# Patient Record
Sex: Female | Born: 1970 | Race: White | Hispanic: No | Marital: Single | Smoking: Current every day smoker
Health system: Southern US, Community
[De-identification: ages and names within clinical notes are randomized; demographics above are authoritative.]

## PROBLEM LIST (undated history)

## (undated) DIAGNOSIS — F319 Bipolar disorder, unspecified: Secondary | ICD-10-CM

## (undated) DIAGNOSIS — R519 Headache, unspecified: Secondary | ICD-10-CM

## (undated) DIAGNOSIS — M1990 Unspecified osteoarthritis, unspecified site: Secondary | ICD-10-CM

## (undated) DIAGNOSIS — F32A Depression, unspecified: Secondary | ICD-10-CM

## (undated) DIAGNOSIS — F329 Major depressive disorder, single episode, unspecified: Secondary | ICD-10-CM

## (undated) DIAGNOSIS — R32 Unspecified urinary incontinence: Secondary | ICD-10-CM

## (undated) DIAGNOSIS — C801 Malignant (primary) neoplasm, unspecified: Secondary | ICD-10-CM

## (undated) DIAGNOSIS — J45909 Unspecified asthma, uncomplicated: Secondary | ICD-10-CM

## (undated) DIAGNOSIS — R51 Headache: Secondary | ICD-10-CM

## (undated) DIAGNOSIS — B019 Varicella without complication: Secondary | ICD-10-CM

## (undated) DIAGNOSIS — G43909 Migraine, unspecified, not intractable, without status migrainosus: Secondary | ICD-10-CM

## (undated) HISTORY — DX: Varicella without complication: B01.9

## (undated) HISTORY — DX: Headache: R51

## (undated) HISTORY — DX: Headache, unspecified: R51.9

## (undated) HISTORY — DX: Depression, unspecified: F32.A

## (undated) HISTORY — DX: Major depressive disorder, single episode, unspecified: F32.9

## (undated) HISTORY — DX: Unspecified urinary incontinence: R32

## (undated) HISTORY — DX: Unspecified osteoarthritis, unspecified site: M19.90

## (undated) HISTORY — PX: OTHER SURGICAL HISTORY: SHX169

## (undated) HISTORY — DX: Migraine, unspecified, not intractable, without status migrainosus: G43.909

---

## 2000-09-09 HISTORY — PX: LAPAROSCOPIC REMOVAL ABDOMINAL MASS: SHX6360

## 2012-09-09 HISTORY — PX: ABDOMINAL HYSTERECTOMY: SHX81

## 2014-04-01 ENCOUNTER — Inpatient Hospital Stay (HOSPITAL_COMMUNITY)
Admission: AD | Admit: 2014-04-01 | Discharge: 2014-04-05 | DRG: 897 | Disposition: A | Discharge status: Home / Self Care | Payer: 59 | Source: Intra-hospital | Attending: Psychiatry | Admitting: Psychiatry

## 2014-04-01 ENCOUNTER — Encounter (HOSPITAL_COMMUNITY): Payer: Self-pay | Admitting: Emergency Medicine

## 2014-04-01 ENCOUNTER — Encounter (HOSPITAL_COMMUNITY): Payer: Self-pay | Admitting: *Deleted

## 2014-04-01 ENCOUNTER — Emergency Department (HOSPITAL_COMMUNITY)
Admission: EM | Admit: 2014-04-01 | Discharge: 2014-04-01 | Disposition: A | Discharge status: Other Acute Inpatient Hospital | Payer: Self-pay | Attending: Psychiatry | Admitting: Psychiatry

## 2014-04-01 DIAGNOSIS — F4310 Post-traumatic stress disorder, unspecified: Secondary | ICD-10-CM | POA: Diagnosis present

## 2014-04-01 DIAGNOSIS — F332 Major depressive disorder, recurrent severe without psychotic features: Secondary | ICD-10-CM | POA: Diagnosis present

## 2014-04-01 DIAGNOSIS — G4700 Insomnia, unspecified: Secondary | ICD-10-CM | POA: Diagnosis present

## 2014-04-01 DIAGNOSIS — R45851 Suicidal ideations: Secondary | ICD-10-CM | POA: Insufficient documentation

## 2014-04-01 DIAGNOSIS — F1120 Opioid dependence, uncomplicated: Secondary | ICD-10-CM | POA: Diagnosis present

## 2014-04-01 DIAGNOSIS — Z23 Encounter for immunization: Secondary | ICD-10-CM | POA: Diagnosis not present

## 2014-04-01 DIAGNOSIS — F329 Major depressive disorder, single episode, unspecified: Secondary | ICD-10-CM

## 2014-04-01 DIAGNOSIS — F17200 Nicotine dependence, unspecified, uncomplicated: Secondary | ICD-10-CM | POA: Insufficient documentation

## 2014-04-01 DIAGNOSIS — J45909 Unspecified asthma, uncomplicated: Secondary | ICD-10-CM | POA: Diagnosis present

## 2014-04-01 DIAGNOSIS — Z79899 Other long term (current) drug therapy: Secondary | ICD-10-CM | POA: Insufficient documentation

## 2014-04-01 DIAGNOSIS — F32A Depression, unspecified: Secondary | ICD-10-CM | POA: Diagnosis present

## 2014-04-01 DIAGNOSIS — R6883 Chills (without fever): Secondary | ICD-10-CM | POA: Insufficient documentation

## 2014-04-01 DIAGNOSIS — R112 Nausea with vomiting, unspecified: Secondary | ICD-10-CM | POA: Insufficient documentation

## 2014-04-01 DIAGNOSIS — G8929 Other chronic pain: Secondary | ICD-10-CM | POA: Diagnosis present

## 2014-04-01 DIAGNOSIS — Z8543 Personal history of malignant neoplasm of ovary: Secondary | ICD-10-CM | POA: Insufficient documentation

## 2014-04-01 DIAGNOSIS — F411 Generalized anxiety disorder: Secondary | ICD-10-CM | POA: Diagnosis present

## 2014-04-01 DIAGNOSIS — Z598 Other problems related to housing and economic circumstances: Secondary | ICD-10-CM | POA: Diagnosis not present

## 2014-04-01 DIAGNOSIS — Z5987 Material hardship due to limited financial resources, not elsewhere classified: Secondary | ICD-10-CM

## 2014-04-01 DIAGNOSIS — IMO0002 Reserved for concepts with insufficient information to code with codable children: Secondary | ICD-10-CM | POA: Insufficient documentation

## 2014-04-01 DIAGNOSIS — F1110 Opioid abuse, uncomplicated: Secondary | ICD-10-CM | POA: Insufficient documentation

## 2014-04-01 DIAGNOSIS — Z008 Encounter for other general examination: Secondary | ICD-10-CM | POA: Insufficient documentation

## 2014-04-01 DIAGNOSIS — F3289 Other specified depressive episodes: Secondary | ICD-10-CM | POA: Insufficient documentation

## 2014-04-01 DIAGNOSIS — Z5989 Other problems related to housing and economic circumstances: Secondary | ICD-10-CM | POA: Diagnosis not present

## 2014-04-01 DIAGNOSIS — F419 Anxiety disorder, unspecified: Secondary | ICD-10-CM

## 2014-04-01 HISTORY — DX: Unspecified asthma, uncomplicated: J45.909

## 2014-04-01 HISTORY — DX: Bipolar disorder, unspecified: F31.9

## 2014-04-01 HISTORY — DX: Malignant (primary) neoplasm, unspecified: C80.1

## 2014-04-01 LAB — RAPID URINE DRUG SCREEN, HOSP PERFORMED
AMPHETAMINES: NOT DETECTED
BARBITURATES: NOT DETECTED
Benzodiazepines: NOT DETECTED
COCAINE: NOT DETECTED
Opiates: POSITIVE — AB
TETRAHYDROCANNABINOL: NOT DETECTED

## 2014-04-01 LAB — BASIC METABOLIC PANEL
Anion gap: 13 (ref 5–15)
BUN: 11 mg/dL (ref 6–23)
CHLORIDE: 103 meq/L (ref 96–112)
CO2: 24 meq/L (ref 19–32)
CREATININE: 0.7 mg/dL (ref 0.50–1.10)
Calcium: 8.9 mg/dL (ref 8.4–10.5)
GFR calc non Af Amer: >90 mL/min (ref >90)
GLUCOSE: 107 mg/dL — AB (ref 70–99)
POTASSIUM: 3.8 meq/L (ref 3.7–5.3)
Sodium: 140 mEq/L (ref 137–147)

## 2014-04-01 LAB — CBC WITH DIFFERENTIAL/PLATELET
Basophils Absolute: 0 10*3/uL (ref 0.0–0.1)
Basophils Relative: 0 % (ref 0–1)
Eosinophils Absolute: 0 10*3/uL (ref 0.0–0.7)
Eosinophils Relative: 1 % (ref 0–5)
HEMATOCRIT: 43.5 % (ref 36.0–46.0)
HEMOGLOBIN: 15.3 g/dL — AB (ref 12.0–15.0)
LYMPHS ABS: 0.7 10*3/uL (ref 0.7–4.0)
LYMPHS PCT: 22 % (ref 12–46)
MCH: 32.7 pg (ref 26.0–34.0)
MCHC: 35.2 g/dL (ref 30.0–36.0)
MCV: 92.9 fL (ref 78.0–100.0)
Monocytes Absolute: 0.3 10*3/uL (ref 0.1–1.0)
Monocytes Relative: 8 % (ref 3–12)
NEUTROS ABS: 2.2 10*3/uL (ref 1.7–7.7)
NEUTROS PCT: 69 % (ref 43–77)
Platelets: 249 10*3/uL (ref 150–400)
RBC: 4.68 MIL/uL (ref 3.87–5.11)
RDW: 13.6 % (ref 11.5–15.5)
WBC: 3.2 10*3/uL — ABNORMAL LOW (ref 4.0–10.5)

## 2014-04-01 LAB — ETHANOL

## 2014-04-01 MED ORDER — ONDANSETRON HCL 4 MG PO TABS: 4.0000 mg | ORAL_TABLET | Freq: Three times a day (TID) | ORAL | Status: DC | PRN

## 2014-04-01 MED ORDER — ONDANSETRON 8 MG PO TBDP
8.0000 mg | ORAL_TABLET | Freq: Once | ORAL | Status: AC
  Administered 2014-04-01: 8 mg via ORAL
  Filled 2014-04-01: qty 1

## 2014-04-01 MED ORDER — CLONIDINE HCL 0.1 MG PO TABS
0.1000 mg | ORAL_TABLET | Freq: Four times a day (QID) | ORAL | Status: AC
  Administered 2014-04-02 – 2014-04-04 (×8): 0.1 mg via ORAL
  Filled 2014-04-01 (×12): qty 1

## 2014-04-01 MED ORDER — ALBUTEROL SULFATE HFA 108 (90 BASE) MCG/ACT IN AERS: 2.0000 | INHALATION_SPRAY | Freq: Four times a day (QID) | RESPIRATORY_TRACT | Status: DC | PRN

## 2014-04-01 MED ORDER — CLONIDINE HCL 0.1 MG PO TABS
0.1000 mg | ORAL_TABLET | Freq: Every day | ORAL | Status: DC
  Filled 2014-04-01: qty 1

## 2014-04-01 MED ORDER — MAGNESIUM HYDROXIDE 400 MG/5ML PO SUSP: 30.0000 mL | Freq: Every day | ORAL | Status: DC | PRN

## 2014-04-01 MED ORDER — ALUM & MAG HYDROXIDE-SIMETH 200-200-20 MG/5ML PO SUSP: 30.0000 mL | ORAL | Status: DC | PRN

## 2014-04-01 MED ORDER — LORAZEPAM 1 MG PO TABS: 1.0000 mg | ORAL_TABLET | Freq: Three times a day (TID) | ORAL | Status: DC | PRN

## 2014-04-01 MED ORDER — METHOCARBAMOL 500 MG PO TABS
500.0000 mg | ORAL_TABLET | Freq: Three times a day (TID) | ORAL | Status: DC | PRN
  Administered 2014-04-02 – 2014-04-05 (×6): 500 mg via ORAL
  Filled 2014-04-01 (×7): qty 1

## 2014-04-01 MED ORDER — MOMETASONE FURO-FORMOTEROL FUM 100-5 MCG/ACT IN AERO
2.0000 | INHALATION_SPRAY | Freq: Two times a day (BID) | RESPIRATORY_TRACT | Status: DC
  Administered 2014-04-02 – 2014-04-05 (×7): 2 via RESPIRATORY_TRACT
  Filled 2014-04-01: qty 8.8

## 2014-04-01 MED ORDER — ACETAMINOPHEN 325 MG PO TABS
650.0000 mg | ORAL_TABLET | Freq: Four times a day (QID) | ORAL | Status: DC | PRN
  Administered 2014-04-03: 650 mg via ORAL
  Filled 2014-04-01: qty 2

## 2014-04-01 MED ORDER — PNEUMOCOCCAL VAC POLYVALENT 25 MCG/0.5ML IJ INJ: 0.5000 mL | INJECTION | INTRAMUSCULAR | Status: DC

## 2014-04-01 MED ORDER — ZOLPIDEM TARTRATE 5 MG PO TABS: 5.0000 mg | ORAL_TABLET | Freq: Every evening | ORAL | Status: DC | PRN

## 2014-04-01 MED ORDER — DICYCLOMINE HCL 20 MG PO TABS
20.0000 mg | ORAL_TABLET | Freq: Four times a day (QID) | ORAL | Status: DC | PRN
  Administered 2014-04-02 – 2014-04-04 (×7): 20 mg via ORAL
  Filled 2014-04-01 (×7): qty 1

## 2014-04-01 MED ORDER — NAPROXEN 500 MG PO TABS
500.0000 mg | ORAL_TABLET | Freq: Two times a day (BID) | ORAL | Status: DC | PRN
  Administered 2014-04-02: 500 mg via ORAL
  Filled 2014-04-01: qty 1

## 2014-04-01 MED ORDER — ACETAMINOPHEN 325 MG PO TABS: 650.0000 mg | ORAL_TABLET | ORAL | Status: DC | PRN

## 2014-04-01 MED ORDER — LORAZEPAM 1 MG PO TABS
2.0000 mg | ORAL_TABLET | Freq: Once | ORAL | Status: AC
  Administered 2014-04-01: 2 mg via ORAL
  Filled 2014-04-01: qty 2

## 2014-04-01 MED ORDER — IBUPROFEN 400 MG PO TABS: 600.0000 mg | ORAL_TABLET | Freq: Three times a day (TID) | ORAL | Status: DC | PRN

## 2014-04-01 MED ORDER — LOPERAMIDE HCL 2 MG PO CAPS
2.0000 mg | ORAL_CAPSULE | ORAL | Status: DC | PRN
  Administered 2014-04-02: 4 mg via ORAL
  Filled 2014-04-01: qty 2

## 2014-04-01 MED ORDER — NICOTINE 21 MG/24HR TD PT24: 21.0000 mg | MEDICATED_PATCH | Freq: Every day | TRANSDERMAL | Status: DC

## 2014-04-01 MED ORDER — HYDROXYZINE HCL 25 MG PO TABS
25.0000 mg | ORAL_TABLET | Freq: Four times a day (QID) | ORAL | Status: DC | PRN
  Administered 2014-04-02 – 2014-04-05 (×10): 25 mg via ORAL
  Filled 2014-04-01 (×10): qty 1

## 2014-04-01 MED ORDER — TRAZODONE HCL 50 MG PO TABS
50.0000 mg | ORAL_TABLET | Freq: Every evening | ORAL | Status: DC | PRN
  Administered 2014-04-02 – 2014-04-05 (×3): 50 mg via ORAL
  Filled 2014-04-01 (×2): qty 1
  Filled 2014-04-01: qty 14
  Filled 2014-04-01: qty 1

## 2014-04-01 MED ORDER — ONDANSETRON 4 MG PO TBDP: 4.0000 mg | ORAL_TABLET | Freq: Four times a day (QID) | ORAL | Status: DC | PRN

## 2014-04-01 MED ORDER — CLONIDINE HCL 0.1 MG PO TABS
0.1000 mg | ORAL_TABLET | ORAL | Status: DC
  Administered 2014-04-04: 0.1 mg via ORAL
  Filled 2014-04-01 (×4): qty 1

## 2014-04-01 NOTE — Discharge Instructions (Signed)
Go to [facility] now. °

## 2014-04-01 NOTE — Progress Notes (Addendum)
Admission note:  Pt is a 43 year old [place] female admitted to the services of Dr. Sabra Heck for detox from opiates.  She states she has been abusing her pain medication up to 15-20 pills daily.  Pt states she has only used heroin once then stopped because it made her vomit.  She has been Restaurant manager, fast food off the street.  Pt is wishing to get clean because she states that this is no way to live, it is too expensive and is controlling her life.  Pt has a hx of cancer and has been cancer free for the last 6 months after having undergone a hysterectomy and partial bladder removal.  Pt is cooperative with the admission process and in no acute withdrawal.  She states her last use was two days ago.
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
## 2014-04-01 NOTE — ED Notes (Addendum)
Pt and father now upset that pt has to stay here. Comfort measure and therapeutic discussion given. EDP aware. Vo to start IVC papers.

## 2014-04-01 NOTE — ED Notes (Signed)
[facility] stated pt needed psych eval due to SI note in triage. Advised pt stated she has thought about it but denied si with my initial assessment. [facility] stated still needed one due to circumstances. EDP aware and telepschy ordered. PT aware. Pt stated she really didn't understand why she was being sent there anyway bc she knows she needs help with her depression also. [facility] called back and aware of telepsych being ordered. Pt has had no v/d in ED. Father present and updated per pt reqeust
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
## 2014-04-01 NOTE — ED Notes (Addendum)
Pt here for medical clearance to go to [facility] for detox from opioids. Last use x 2 days. Pt also has h/s manic depression and has not been taking meds x 3 weeks. Pt is SI. Denies HI.

## 2014-04-02 DIAGNOSIS — F1994 Other psychoactive substance use, unspecified with psychoactive substance-induced mood disorder: Secondary | ICD-10-CM

## 2014-04-02 DIAGNOSIS — F1120 Opioid dependence, uncomplicated: Principal | ICD-10-CM

## 2014-04-02 DIAGNOSIS — F332 Major depressive disorder, recurrent severe without psychotic features: Secondary | ICD-10-CM

## 2014-04-02 DIAGNOSIS — F4310 Post-traumatic stress disorder, unspecified: Secondary | ICD-10-CM

## 2014-04-02 MED ORDER — GABAPENTIN 100 MG PO CAPS
100.0000 mg | ORAL_CAPSULE | Freq: Three times a day (TID) | ORAL | Status: DC
  Administered 2014-04-02 – 2014-04-05 (×10): 100 mg via ORAL
  Filled 2014-04-02: qty 1
  Filled 2014-04-02: qty 42
  Filled 2014-04-02 (×10): qty 1
  Filled 2014-04-02: qty 42
  Filled 2014-04-02 (×3): qty 1
  Filled 2014-04-02: qty 42
  Filled 2014-04-02 (×2): qty 1

## 2014-04-02 MED ORDER — NICOTINE 21 MG/24HR TD PT24
21.0000 mg | MEDICATED_PATCH | Freq: Every day | TRANSDERMAL | Status: DC
  Administered 2014-04-02 – 2014-04-05 (×4): 21 mg via TRANSDERMAL
  Filled 2014-04-02 (×8): qty 1

## 2014-04-02 MED ORDER — SERTRALINE HCL 25 MG PO TABS
25.0000 mg | ORAL_TABLET | Freq: Every day | ORAL | Status: DC
  Administered 2014-04-02 – 2014-04-05 (×4): 25 mg via ORAL
  Filled 2014-04-02: qty 14
  Filled 2014-04-02 (×7): qty 1

## 2014-04-02 NOTE — BHH Group Notes (Signed)
[facility] Group Notes:  (Nursing/MHT/Case Management/Adjunct)  Date:  04/02/2014  Time:  3:32 PM  Type of Therapy:  Psychoeducational Skills  Participation Level:  Did Not Attend  Yvette Gaines 04/02/2014, 3:32 PM

## 2014-04-02 NOTE — BHH Group Notes (Signed)
[facility] Group Notes:  (Clinical Social Work)  04/02/2014     10-11AM  Summary of Progress/Problems:   The main focus of today's process group was for the patient to identify ways in which they have in the past sabotaged their own recovery. Motivational Interviewing and a worksheet were utilized to help patients explore in depth the perceived benefits and costs of their substance use, as well as the potential benefits and costs of stopping.  The Stages of Change were explained using a handout, with an emphasis on making plans to deal with sabotaging behaviors proactively.  The patient listened but was silent throughout group.  Type of Therapy:  Group Therapy - Process   Participation Level:  Active  Participation Quality:  Attentive  Affect:  Flat  Cognitive:  Oriented  Insight:  Limited  Engagement in Therapy:  Limited  Modes of Intervention:  Education, Support and Processing, Motivational Interviewing  Selmer Dominion, LCSW 04/02/2014, 11:21 AM

## 2014-04-02 NOTE — Progress Notes (Signed)
Patient ID: Yvette Gaines, female   DOB: 1971-04-19, 43 y.o.   MRN: [id number]   D: Pt has been very flat and depressed on the unit today. Pt has also reported severe withdrawals, and has required frequent as needed medication. Pt attended all groups and has engaged in treatment. Pt reported being negative SI/HI, no AH/VH noted. A: 15 min checks continued for patient safety. R: Pt safety maintained.

## 2014-04-02 NOTE — BHH Group Notes (Signed)
[place] Group Notes:  (Nursing/MHT/Case Management/Adjunct)  Date:  04/02/2014  Time:  10:45 AM  Type of Therapy:  Psychoeducational Skills  Participation Level:  Active  Participation Quality:  Appropriate  Affect:  Appropriate  Cognitive:  Appropriate  Insight:  Appropriate  Engagement in Group:  Engaged  Modes of Intervention:  Discussion  Summary of Progress/Problems: Pt did attend self inventory group, pt reported that she was negative SI/HI, no AH/VH noted. Pt rated her depression as a 8, and her helplessness/hopelessness as a 6.     Pt reported no issues or concerns.   Yvette Gaines 04/02/2014, 10:45 AM

## 2014-04-02 NOTE — Progress Notes (Signed)
[facility] Group Notes:  (Nursing/MHT/Case Management/Adjunct)  Date:  04/02/2014  Time:  6:48 PM  Type of Therapy:  Psychoeducational Skills  Participation Level:  Active  Participation Quality:  Appropriate and Attentive  Affect:  Appropriate  Cognitive:  Appropriate  Insight:  Appropriate  Engagement in Group:  Engaged and Supportive  Modes of Intervention:  Activity  Summary of Progress/Problems: Pts played an activity of Pictionary using coping skills. Pts enjoyed playing while learning new coping skills.  Clint Bolder 04/02/2014, 6:48 PM
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
## 2014-04-02 NOTE — Progress Notes (Signed)
D.  Pt has been withdrawing heavily today, did not feel well enough to attend evening AA group.  Interacting appropriately within milieu.  Denies HI/hallucinations, some passive SI but contracts for safety on unit.  A.  Support and encouragement offered, medication given as ordered for withdrawal.  R.  Pt remains safe, will continue to monitor.

## 2014-04-03 DIAGNOSIS — F1910 Other psychoactive substance abuse, uncomplicated: Secondary | ICD-10-CM

## 2014-04-03 DIAGNOSIS — F329 Major depressive disorder, single episode, unspecified: Secondary | ICD-10-CM | POA: Diagnosis present

## 2014-04-03 DIAGNOSIS — F419 Anxiety disorder, unspecified: Secondary | ICD-10-CM

## 2014-04-03 DIAGNOSIS — F3289 Other specified depressive episodes: Secondary | ICD-10-CM

## 2014-04-03 NOTE — Progress Notes (Signed)
Patient did attend the evening speaker AA meeting.  

## 2014-04-03 NOTE — BHH Group Notes (Signed)
[facility] Group Notes:  (Nursing/MHT/Case Management/Adjunct)  Date:  04/03/2014  Time:  6:42 PM  Type of Therapy:  Psychoeducational Skills  Participation Level:  Active  Participation Quality:  Appropriate  Affect:  Appropriate  Cognitive:  Appropriate  Insight:  Appropriate  Engagement in Group:  Engaged  Modes of Intervention:  Discussion  Summary of Progress/Problems: Pt did attend health support systems group, she noted her father.   Yvette Gaines 04/03/2014, 6:42 PM

## 2014-04-03 NOTE — BHH Group Notes (Signed)
[facility] Group Notes:  (Nursing/MHT/Case Management/Adjunct)  Date:  04/03/2014  Time:  6:17 PM  Type of Therapy:  Psychoeducational Skills  Participation Level:  Active  Participation Quality:  Appropriate  Affect:  Appropriate  Cognitive:  Appropriate  Insight:  Appropriate  Engagement in Group:  Engaged  Modes of Intervention:  Discussion  Summary of Progress/Problems: Pt did attend self inventory group, pt reported that she was negative SI/HI, no AH/VH noted. Pt rated her depression as a 3, and her helplessness/hopelessness as a 0.     Benancio Deeds [name] 04/03/2014, 6:17 PM

## 2014-04-03 NOTE — BHH Group Notes (Addendum)
  [facility] LCSW Group Therapy 04/03/2014   Type of Therapy: Group Therapy- Feelings Around Discharge & Establishing a Supportive Framework  Participation Level: Active   Participation Quality:  Appropriate  Affect:  Flat   Cognitive: Alert and Oriented   Insight:  Developing/Improving   Engagement in Therapy: Developing/Improving and Engaged   Modes of Intervention: Clarification, Confrontation, Discussion, Education, Exploration, Limit-setting, Orientation, Problem-solving, Rapport Building, Art therapist, Socialization and Support   Description of Group:   What is a supportive framework? What does it look like feel like and how do I discern it from and unhealthy non-supportive network? Learn how to cope when supports are not helpful and don't support you. Discuss what to do when your family/friends are not supportive.  Pt participated actively in group, identifying her father as a positive support for her.  Pt reports that her father has been sober for nearly 20 years; Pt recently moved in with her father to get away from her current environment.  Pt and father had IOP scheduled prior to Pt's admission.  She describes that "willpower" is important in choosing positive supports and distancing oneself from negative influences.     Therapeutic Modalities:   Cognitive Behavioral Therapy Person-Centered Therapy Motivational Interviewing   Peri Maris, [place] 04/03/2014 12:18 PM

## 2014-04-03 NOTE — Progress Notes (Signed)
D:  Pt has flat affect with depressed mood. Minimal but appropriate interaction. Pt reports withdrawal but less than the day before. Reports night sweats. Denies SI/HI/pain/AH/VH/. Attended group. Pt daily goal is to improve well being. Compliant with staff instructions. Pt given readable drug information on Zoloft before agreeing to administration. A: Pt safety maintained with q15 minute checks. Support and encouragement given. R: Will continue to monitor and stabilize. No voiced complaints.

## 2014-04-03 NOTE — Progress Notes (Signed)
D.  Pt pleasant on approach, positive for evening AA group.  Interacting appropriately with peers on unit.  Improving withdrawal symptoms today, less anxious.  Denies SI/HI/hallucinations at this time.  A.  Support and encouragement offered  R.  Pt remains safe on unit, will continue to monitor.

## 2014-04-03 NOTE — Progress Notes (Signed)
[facility] MD Progress Note  04/03/2014 11:16 AM Yvette Gaines  MRN:  [id number] Subjective:    Patiet is calm and cooperative, tearful during assessment.  Rates Depression 5/10 and Anxiety 6/10, "It's time to get myself clean" She tells her story of abuse of opioids starting with her cancer diagnosis and chronic pain.  It has gotten "out of control" over the past six months; she has lost her job, has grown children living at home, is responsible for her heroine addicted daughter's two children. Her main support person is her father who is clean 15 years from opoid addiction.  She is very familial with NA and the program.  Her work has told her she has a job in 90 days and she plans to utilize the time to get things back in order.  She is hopeful for the future, she recognizes her need to set boundaries.  Rates depression 4/10 Anxiety 5/10  Sleep is fair and appetite is poor  Diagnosis:   DSM5: Trauma-Stressor Disorders:  Posttraumatic Stress Disorder (309.81) Substance/Addictive Disorders:  Opioid Disorder - Severe (304.00) Depressive Disorders:  Major Depressive Disorder - Severe (296.23) Total Time spent with patient: 30 minutes  Axis I: Depressive Disorder NOS and Substance Abuse Axis II: Deferred Axis III:  Past Medical History  Diagnosis Date  . Cancer     ovarian, bladder  . Manic depression   . Asthma    Axis IV: economic problems, educational problems, problems related to legal system/crime, problems with access to health care services and problems with primary support group Axis V: 41-50 serious symptoms  ADL's:  Intact  Sleep: Fair  Appetite:  poor Suicidal Ideation: denies presently Homicidal Ideation: denies presently AEB (as evidenced by):  Psychiatric Specialty Exam: Physical Exam  Constitutional: She is oriented to person, place, and time. She appears well-developed and well-nourished.  HENT:  Head: Normocephalic and atraumatic.  Neck: Normal range of motion.  Neck supple.  Neurological: She is alert and oriented to person, place, and time.  Skin: Skin is warm and dry.    ROS  Blood pressure 90/65, pulse 66, temperature 97.5 F (36.4 C), temperature source Oral, resp. rate 16, height 5' 2"  (1.575 m), weight 56.7 kg (125 lb), SpO2 100.00%.Body mass index is 22.86 kg/(m^2).  General Appearance: Casual  Eye Contact::  Fair  Speech:  Slow  Volume:  Decreased  Mood:  Depressed  Affect:  Depressed and Tearful  Thought Process:  Goal Directed  Orientation:  Full (Time, Place, and Person)  Thought Content:  Negative  Suicidal Thoughts:  No  Homicidal Thoughts:  No  Memory:  Immediate;   Fair Recent;   Fair Remote;   Fair  Judgement:  Impaired  Insight:  Lacking  Psychomotor Activity:  Normal  Concentration:  Fair  Recall:  [place]  Language: Fair  Akathisia:  Negative  Handed:  Right  AIMS (if indicated):     Assets:  Communication Skills Desire for [place] Talents/Skills  Sleep:  Number of Hours: 6.75   Musculoskeletal: Strength & Muscle Tone: within normal limits Gait & Station: normal Patient leans: N/A  Current Medications: Current Facility-Administered Medications  Medication Dose Route Frequency Provider Last Rate Last Dose  . acetaminophen (TYLENOL) tablet 650 mg  650 mg Oral Q6H PRN Lurena Nida, NP      . albuterol (PROVENTIL HFA;VENTOLIN HFA) 108 (90 BASE) MCG/ACT inhaler 2 puff  2 puff Inhalation Q6H PRN Lurena Nida,  NP      . alum & mag hydroxide-simeth (MAALOX/MYLANTA) 200-200-20 MG/5ML suspension 30 mL  30 mL Oral Q4H PRN Lurena Nida, NP      . cloNIDine (CATAPRES) tablet 0.1 mg  0.1 mg Oral QID Lurena Nida, NP   0.1 mg at 04/03/14 1109   Followed by  . [START ON 04/04/2014] cloNIDine (CATAPRES) tablet 0.1 mg  0.1 mg Oral BH-qamhs Lurena Nida, NP       Followed by  . [START ON 04/07/2014] cloNIDine (CATAPRES) tablet 0.1 mg  0.1 mg Oral QAC  breakfast Lurena Nida, NP      . dicyclomine (BENTYL) tablet 20 mg  20 mg Oral Q6H PRN Lurena Nida, NP   20 mg at 04/03/14 0603  . gabapentin (NEURONTIN) capsule 100 mg  100 mg Oral TID Delfin Gant, NP   100 mg at 04/03/14 1109  . hydrOXYzine (ATARAX/VISTARIL) tablet 25 mg  25 mg Oral Q6H PRN Lurena Nida, NP   25 mg at 04/03/14 0603  . loperamide (IMODIUM) capsule 2-4 mg  2-4 mg Oral PRN Lurena Nida, NP   4 mg at 04/02/14 0610  . magnesium hydroxide (MILK OF MAGNESIA) suspension 30 mL  30 mL Oral Daily PRN Lurena Nida, NP      . methocarbamol (ROBAXIN) tablet 500 mg  500 mg Oral Q8H PRN Lurena Nida, NP   500 mg at 04/03/14 0603  . mometasone-formoterol (DULERA) 100-5 MCG/ACT inhaler 2 puff  2 puff Inhalation BID Lurena Nida, NP   2 puff at 04/03/14 4315  . naproxen (NAPROSYN) tablet 500 mg  500 mg Oral BID PRN Lurena Nida, NP   500 mg at 04/02/14 4008  . nicotine (NICODERM CQ - dosed in mg/24 hours) patch 21 mg  21 mg Transdermal Daily Nicholaus Bloom, MD   21 mg at 04/03/14 0818  . ondansetron (ZOFRAN-ODT) disintegrating tablet 4 mg  4 mg Oral Q6H PRN Lurena Nida, NP      . pneumococcal 23 valent vaccine (PNU-IMMUNE) injection 0.5 mL  0.5 mL Intramuscular Tomorrow-1000 Nicholaus Bloom, MD      . sertraline (ZOLOFT) tablet 25 mg  25 mg Oral Daily Delfin Gant, NP   25 mg at 04/03/14 0818  . traZODone (DESYREL) tablet 50 mg  50 mg Oral QHS PRN Lurena Nida, NP   50 mg at 04/02/14 2137    Lab Results:  Results for orders placed during the hospital encounter of 04/01/14 (from the past 48 hour(s))  URINE RAPID DRUG SCREEN (HOSP PERFORMED)     Status: Abnormal   Collection Time    04/01/14  3:07 PM      Result Value Ref Range   Opiates POSITIVE (*) NONE DETECTED   Cocaine NONE DETECTED  NONE DETECTED   Benzodiazepines NONE DETECTED  NONE DETECTED   Amphetamines NONE DETECTED  NONE DETECTED   Tetrahydrocannabinol NONE DETECTED  NONE DETECTED   Barbiturates NONE  DETECTED  NONE DETECTED   Comment:            DRUG SCREEN FOR MEDICAL PURPOSES     ONLY.  IF CONFIRMATION IS NEEDED     FOR ANY PURPOSE, NOTIFY LAB     WITHIN 5 DAYS.                LOWEST DETECTABLE LIMITS     FOR URINE DRUG SCREEN     Drug  Class       Cutoff (ng/mL)     Amphetamine      1000     Barbiturate      200     Benzodiazepine   235     Tricyclics       361     Opiates          300     Cocaine          300     THC              50  CBC WITH DIFFERENTIAL     Status: Abnormal   Collection Time    04/01/14  3:35 PM      Result Value Ref Range   WBC 3.2 (*) 4.0 - 10.5 K/uL   RBC 4.68  3.87 - 5.11 MIL/uL   Hemoglobin 15.3 (*) 12.0 - 15.0 g/dL   HCT 43.5  36.0 - 46.0 %   MCV 92.9  78.0 - 100.0 fL   MCH 32.7  26.0 - 34.0 pg   MCHC 35.2  30.0 - 36.0 g/dL   RDW 13.6  11.5 - 15.5 %   Platelets 249  150 - 400 K/uL   Neutrophils Relative % 69  43 - 77 %   Neutro Abs 2.2  1.7 - 7.7 K/uL   Lymphocytes Relative 22  12 - 46 %   Lymphs Abs 0.7  0.7 - 4.0 K/uL   Monocytes Relative 8  3 - 12 %   Monocytes Absolute 0.3  0.1 - 1.0 K/uL   Eosinophils Relative 1  0 - 5 %   Eosinophils Absolute 0.0  0.0 - 0.7 K/uL   Basophils Relative 0  0 - 1 %   Basophils Absolute 0.0  0.0 - 0.1 K/uL  BASIC METABOLIC PANEL     Status: Abnormal   Collection Time    04/01/14  3:35 PM      Result Value Ref Range   Sodium 140  137 - 147 mEq/L   Potassium 3.8  3.7 - 5.3 mEq/L   Chloride 103  96 - 112 mEq/L   CO2 24  19 - 32 mEq/L   Glucose, Bld 107 (*) 70 - 99 mg/dL   BUN 11  6 - 23 mg/dL   Creatinine, Ser 0.70  0.50 - 1.10 mg/dL   Calcium 8.9  8.4 - 10.5 mg/dL   GFR calc non Af Amer >90  >90 mL/min   GFR calc Af Amer >90  >90 mL/min   Comment: (NOTE)     The eGFR has been calculated using the CKD EPI equation.     This calculation has not been validated in all clinical situations.     eGFR's persistently <90 mL/min signify possible Chronic Kidney     Disease.   Anion gap 13  5 - 15  ETHANOL      Status: None   Collection Time    04/01/14  3:35 PM      Result Value Ref Range   Alcohol, Ethyl (B) <11  0 - 11 mg/dL   Comment:            LOWEST DETECTABLE LIMIT FOR     SERUM ALCOHOL IS 11 mg/dL     FOR MEDICAL PURPOSES ONLY    Physical Findings: AIMS: Facial and Oral Movements Muscles of Facial Expression: None, normal Lips and Perioral Area: None, normal Jaw: None, normal Tongue: None, normal,Extremity Movements Upper (  arms, wrists, hands, fingers): None, normal Lower (legs, knees, ankles, toes): None, normal, Trunk Movements Neck, shoulders, hips: None, normal, Overall Severity Severity of abnormal movements (highest score from questions above): None, normal Incapacitation due to abnormal movements: None, normal Patient's awareness of abnormal movements (rate only patient's report): No Awareness, Dental Status Current problems with teeth and/or dentures?: No Does patient usually wear dentures?: No  CIWA:  CIWA-Ar Total: 3 COWS:  COWS Total Score: 5  Treatment Plan Summary: Daily contact with patient to assess and evaluate symptoms and progress in treatment Medication management -continue detox protocol- no side effects reported -attend groups -continue crisis management and stabilization. Estimated length of stay 5-7 days -coping skills for depression, substance abuse, and anxiety -Treat health problems as indicated. - Develop treatment plan to decrease risk of relapse upon discharge and the need for readmission -Psych-social education regarding relapse prevention and self care. -Disposition in progress   Medical Decision Making Problem Points:  Established problem, stable/improving (1) and Review of psycho-social stressors (1) Data Points:  Review of medication regiment & side effects (2) Review of new medications or change in dosage (2)  I certify that inpatient services furnished can reasonably be expected to improve the patient's condition.   [place],  [place] 04/03/2014, 11:16 AM I agreed with findings and treatment plan of this patient

## 2014-04-03 NOTE — Tx Team (Signed)
Initial Interdisciplinary Treatment Plan  PATIENT STRENGTHS: (choose at least two) Average or above average intelligence Motivation for treatment/growth Supportive family/friends  PATIENT STRESSORS: Health problems Substance abuse   PROBLEM LIST: Problem List/Patient Goals Date to be addressed Date deferred Reason deferred Estimated date of resolution  Depression 04/02/14     Suicidal Ideation 04/02/14     Substance abuse 04/02/14                                          DISCHARGE CRITERIA:  Improved stabilization in mood, thinking, and/or behavior Motivation to continue treatment in a less acute level of care Need for constant or close observation no longer present Withdrawal symptoms are absent or subacute and managed without 24-hour nursing intervention  PRELIMINARY DISCHARGE PLAN: Attend 12-step recovery group Return to previous living arrangement  PATIENT/FAMIILY INVOLVEMENT: This treatment plan has been presented to and reviewed with the patient, Yvette Gaines.  The patient and family have been given the opportunity to ask questions and make suggestions.  Margaretann Loveless 04/03/2014, 5:24 AM

## 2014-04-04 MED ORDER — ASPIRIN-ACETAMINOPHEN-CAFFEINE 250-250-65 MG PO TABS
1.0000 | ORAL_TABLET | Freq: Four times a day (QID) | ORAL | Status: DC | PRN
  Administered 2014-04-04: 1 via ORAL
  Filled 2014-04-04 (×2): qty 1

## 2014-04-04 NOTE — Progress Notes (Signed)
Patient ID: Yvette Gaines, female   DOB: March 28, 1971, 43 y.o.   MRN: [id number]   Pt was tearful but pleasant and cooperative during the adm process. Stated, "I didn't want to kill myself, it was stupid". Pt stated that at times "she doesn't like herself, and thinks she's not good enough." Stated, "my family talks about each other then laugh and talk in each other's face".  Pt states she was to start a program at [facility] under the leadership of Jennelle Human.  States this month is supposed to be her first month at [facility] set up by her probation officer. Pt has a 10 and 43 yr old that stay with their dad. Pt and her husband live with the pt's mother. Pt stated she's been having a difficult time finding a job because she's a felon. "I feel like a failure".

## 2014-04-04 NOTE — Progress Notes (Signed)
Adult Psychoeducational Group Note  Date:  04/04/2014 Time:  10:00 am   Group Topic/Focus:  Wellness Toolbox:   The focus of this group is to discuss various aspects of wellness, balancing those aspects and exploring ways to increase the ability to experience wellness.  Patients will create a wellness toolbox for use upon discharge.  Participation Level:  Active  Participation Quality:  Appropriate, Sharing and Supportive  Affect:  Appropriate  Cognitive:  Appropriate  Insight: Appropriate  Engagement in Group:  Engaged  Modes of Intervention:  Discussion, Education, Socialization and Support  Additional Comments:  Pt stated that by staying clean and taking care of herself more than others that she can live a more healthy lifestyle. Pt stated that the barriers to her progress are the people in her life that are negative influences and an overwhelming family life.  Yvette Gaines 04/04/2014, 2:09 PM

## 2014-04-04 NOTE — Progress Notes (Signed)
Case discussed with NP. Above note reviewed. Agree with above assessment and plan.  Dereck Leep, MD

## 2014-04-04 NOTE — Progress Notes (Signed)
Patient ID: Yvette Gaines, female   DOB: [birth year], 43 y.o.   MRN: [id number] She has been up and to groups interacting with peers and staff. Has attended groups requested and received prn for a migraine at 1703. Self inventory this AM: depression 1, hopelessness 0, anxiety 2, denies withdrawal symptoms other then being tired. Denies SI thoughts and pain. Goal: planning my wellness plan so that I may start my discharge.  To meet goal: make sure my therapy is set up and all my support groups. Need help with medications after discharge.

## 2014-04-04 NOTE — Progress Notes (Signed)
Patient ID: Yvette Gaines, female   DOB: [birth year], 43 y.o.   MRN: [id number]   D: Pt was in the med room interacting with her peers. During the assessment pt informed the writer that she's been "anxious off and on". Stated that in group they were discussing. "going home".  Stated that 2 days before coming to [facility] she gave "up her place and everything in it". Pt stated, "she wanted to get a fresh start".   A:  Support and encouragement was offered. 15 min checks continued for safety.  R: Pt remains safe.

## 2014-04-04 NOTE — BHH Group Notes (Signed)
[facility] LCSW Group Therapy  04/04/2014 2:54 PM  Type of Therapy:  Group Therapy  Participation Level:  Active  Participation Quality:  Attentive  Affect:  Appropriate  Cognitive:  Alert and Oriented  Insight:  Engaged  Engagement in Therapy:  Improving  Modes of Intervention:  Confrontation, Discussion, Education, Exploration, Problem-solving, Rapport Building, Socialization and Support  Summary of Progress/Problems: Today's Topic: Overcoming Obstacles. Pt identified obstacles faced currently and processed barriers involved in overcoming these obstacles. Pt identified steps necessary for overcoming these obstacles and explored motivation (internal and external) for facing these difficulties head on. Pt further identified one area of concern in their lives and chose a skill of focus pulled from their "toolbox." Debbie was attentive and engaged throughout today's therapy group. She shared that she plans to move in with her parents and recently ended a five year relationship with a boyfriend also addicted to pain meds. Koi shows progress in the group setting and improving insight AEB her ability to process how her new living environment "supportive parents, boundaries with adult kids, and going to IOP at [facility]" will help her avoid relapse and put recovery first. Shaneque shared that she made the choice to get well and wants to cut ties with anyone that does not support her decision, including her partner.    Smart, Akaya Proffit LCSWA  04/04/2014, 2:54 PM

## 2014-04-04 NOTE — Progress Notes (Signed)
Patient ID: Yvette Gaines, female   DOB: 1971-07-31, 43 y.o.   MRN: [id number] [facility] MD Progress Note  04/04/2014 4:13 PM Lurdes Haltiwanger  MRN:  [id number]  Subjective: Yvette Gaines says she is here for opioid detox, and that she is tolerating the treatments well. She says the withdrawal symptoms are getting better, however, she complained of headache pains. She adds that she sleeps at night, however, has frequent wake periods. Declines to have something for sleep. She rates her depression at #0, anxiety at #3. [name] hopes to enroll in the IOP program in the  [facility] clinic. Denies any SIHI, AVH.   Diagnosis:   DSM5: Trauma-Stressor Disorders:  Posttraumatic Stress Disorder (309.81) Substance/Addictive Disorders:  Opioid Disorder - Severe (304.00) Depressive Disorders:  Major Depressive Disorder - Severe (296.23) Total Time spent with patient: 30 minutes  Axis I: Depressive Disorder NOS and Substance Abuse Axis II: Deferred Axis III:  Past Medical History  Diagnosis Date  . Cancer     ovarian, bladder  . Manic depression   . Asthma    Axis IV: economic problems, educational problems, problems related to legal system/crime, problems with access to health care services and problems with primary support group Axis V: 41-50 serious symptoms  ADL's:  Intact  Sleep: Fair  Appetite:  poor Suicidal Ideation: denies presently Homicidal Ideation: denies presently AEB (as evidenced by):  Psychiatric Specialty Exam: Physical Exam  Constitutional: She is oriented to person, place, and time. She appears well-developed and well-nourished.  HENT:  Head: Normocephalic and atraumatic.  Neck: Normal range of motion. Neck supple.  Neurological: She is alert and oriented to person, place, and time.  Skin: Skin is warm and dry.    ROS  Blood pressure 102/66, pulse 59, temperature 97.1 F (36.2 C), temperature source Oral, resp. rate 16, height 5\' 2"  (1.575 m), weight 56.7 kg (125 lb), SpO2 100.00%.Body  mass index is 22.86 kg/(m^2).  General Appearance: Casual  Eye Contact::  Fair  Speech:  Slow  Volume:  Decreased  Mood:  Depressed  Affect:  Depressed and Tearful  Thought Process:  Goal Directed  Orientation:  Full (Time, Place, and Person)  Thought Content:  Negative  Suicidal Thoughts:  No  Homicidal Thoughts:  No  Memory:  Immediate;   Fair Recent;   Fair Remote;   Fair  Judgement:  Impaired  Insight:  Lacking  Psychomotor Activity:  Normal  Concentration:  Fair  Recall:  [place]  Language: Fair  Akathisia:  Negative  Handed:  Right  AIMS (if indicated):     Assets:  Communication Skills Desire for [place] Talents/Skills  Sleep:  Number of Hours: 6.5   Musculoskeletal: Strength & Muscle Tone: within normal limits Gait & Station: normal Patient leans: N/A  Current Medications: Current Facility-Administered Medications  Medication Dose Route Frequency Provider Last Rate Last Dose  . acetaminophen (TYLENOL) tablet 650 mg  650 mg Oral Q6H PRN Lurena Nida, NP   650 mg at 04/03/14 1520  . albuterol (PROVENTIL HFA;VENTOLIN HFA) 108 (90 BASE) MCG/ACT inhaler 2 puff  2 puff Inhalation Q6H PRN Lurena Nida, NP      . alum & mag hydroxide-simeth (MAALOX/MYLANTA) 200-200-20 MG/5ML suspension 30 mL  30 mL Oral Q4H PRN Lurena Nida, NP      . cloNIDine (CATAPRES) tablet 0.1 mg  0.1 mg Oral BH-qamhs Lurena Nida, NP       Followed by  . [  START ON 04/07/2014] cloNIDine (CATAPRES) tablet 0.1 mg  0.1 mg Oral QAC breakfast Lurena Nida, NP      . dicyclomine (BENTYL) tablet 20 mg  20 mg Oral Q6H PRN Lurena Nida, NP   20 mg at 04/04/14 0601  . gabapentin (NEURONTIN) capsule 100 mg  100 mg Oral TID Delfin Gant, NP   100 mg at 04/04/14 1200  . hydrOXYzine (ATARAX/VISTARIL) tablet 25 mg  25 mg Oral Q6H PRN Lurena Nida, NP   25 mg at 04/04/14 0601  . loperamide (IMODIUM) capsule 2-4 mg  2-4 mg Oral PRN Lurena Nida, NP   4 mg at 04/02/14 0610  . magnesium hydroxide (MILK OF MAGNESIA) suspension 30 mL  30 mL Oral Daily PRN Lurena Nida, NP      . methocarbamol (ROBAXIN) tablet 500 mg  500 mg Oral Q8H PRN Lurena Nida, NP   500 mg at 04/04/14 0601  . mometasone-formoterol (DULERA) 100-5 MCG/ACT inhaler 2 puff  2 puff Inhalation BID Lurena Nida, NP   2 puff at 04/04/14 2774  . naproxen (NAPROSYN) tablet 500 mg  500 mg Oral BID PRN Lurena Nida, NP   500 mg at 04/02/14 1287  . nicotine (NICODERM CQ - dosed in mg/24 hours) patch 21 mg  21 mg Transdermal Daily Nicholaus Bloom, MD   21 mg at 04/04/14 0835  . ondansetron (ZOFRAN-ODT) disintegrating tablet 4 mg  4 mg Oral Q6H PRN Lurena Nida, NP      . pneumococcal 23 valent vaccine (PNU-IMMUNE) injection 0.5 mL  0.5 mL Intramuscular Tomorrow-1000 Nicholaus Bloom, MD      . sertraline (ZOLOFT) tablet 25 mg  25 mg Oral Daily Delfin Gant, NP   25 mg at 04/04/14 0834  . traZODone (DESYREL) tablet 50 mg  50 mg Oral QHS PRN Lurena Nida, NP   50 mg at 04/03/14 2135    Lab Results:  No results found for this or any previous visit (from the past 48 hour(s)).  Physical Findings: AIMS: Facial and Oral Movements Muscles of Facial Expression: None, normal Lips and Perioral Area: None, normal Jaw: None, normal Tongue: None, normal,Extremity Movements Upper (arms, wrists, hands, fingers): None, normal Lower (legs, knees, ankles, toes): None, normal, Trunk Movements Neck, shoulders, hips: None, normal, Overall Severity Severity of abnormal movements (highest score from questions above): None, normal Incapacitation due to abnormal movements: None, normal Patient's awareness of abnormal movements (rate only patient's report): No Awareness, Dental Status Current problems with teeth and/or dentures?: No Does patient usually wear dentures?: No  CIWA:  CIWA-Ar Total: 3 COWS:  COWS Total Score: 1  Treatment Plan Summary: Daily contact with patient to  assess and evaluate symptoms and progress in treatment Medication management  Plan: 1. Continue crisis management and stabilization.  2. Supportive approach, relapse prevention.  3. Medication management: Continue Ativan taper for Alcohol detox, Excedrin tabs for headache pains..  4. Continue current plan of care.  Medical Decision Making Problem Points:  Established problem, stable/improving (1) and Review of psycho-social stressors (1) Data Points:  Review of medication regiment & side effects (2) Review of new medications or change in dosage (2)  I certify that inpatient services furnished can reasonably be expected to improve the patient's condition.   Lindell Spar I PMHNP 04/04/2014, 4:13 PM

## 2014-04-04 NOTE — BHH Suicide Risk Assessment (Signed)
[place] INPATIENT:  Family/Significant Other Suicide Prevention Education  Suicide Prevention Education:  Education Completed; Yvette Gaines (pt's father) [phone] has been identified by the patient as the family member/significant other with whom the patient will be residing, and identified as the person(s) who will aid the patient in the event of a mental health crisis (suicidal ideations/suicide attempt).  With written consent from the patient, the family member/significant other has been provided the following suicide prevention education, prior to the and/or following the discharge of the patient.  The suicide prevention education provided includes the following:  Suicide risk factors  Suicide prevention and interventions  National Suicide Hotline telephone number  [facility] assessment telephone number  [facility] Emergency Assistance [place] and/or Residential Mobile Crisis Unit telephone number  Request made of family/significant other to:  Remove weapons (e.g., guns, rifles, knives), all items previously/currently identified as safety concern.    Remove drugs/medications (over-the-counter, prescriptions, illicit drugs), all items previously/currently identified as a safety concern.  The family member/significant other verbalizes understanding of the suicide prevention education information provided.  The family member/significant other agrees to remove the items of safety concern listed above.  Smart, Mitchell Epling LCSWA  04/04/2014, 2:58 PM

## 2014-04-04 NOTE — BHH Group Notes (Signed)
[facility] LCSW Aftercare Discharge Planning Group Note   04/04/2014 9:27 AM  Participation Quality:  Appropriate   Mood/Affect:  Appropriate  Depression Rating:  0  Anxiety Rating:  2  Thoughts of Suicide:  No Will you contract for safety?   NA  Current AVH:  No  Plan for Discharge/Comments:  Pt reports that she plans to move back in with her father at d/c and has SA IOP already scheduled with Osborne Casco at [facility]. Pt hoping for d/c "in the next day or so." No withdrawals reported by pt.   Transportation Means: dad  Supports: father/ some family supports identified  Proofreader, Research officer, trade union

## 2014-04-04 NOTE — BHH Group Notes (Signed)
Adult Psychoeducational Group Note  Date:  04/04/2014 Time:  10:18 PM  Group Topic/Focus:  Wrap-Up Group:   The focus of this group is to help patients review their daily goal of treatment and discuss progress on daily workbooks.  Participation Level:  Active  Participation Quality:  Attentive  Affect:  Appropriate  Cognitive:  Appropriate  Insight: Appropriate  Engagement in Group:  Engaged  Modes of Intervention:  Discussion and Education  Additional Comments:  Pt. Listened to [place] visitor and was very attentive but offered little input during open discussion.  Russ Halo 04/04/2014, 10:18 PM

## 2014-04-05 DIAGNOSIS — F321 Major depressive disorder, single episode, moderate: Secondary | ICD-10-CM

## 2014-04-05 DIAGNOSIS — F1110 Opioid abuse, uncomplicated: Secondary | ICD-10-CM

## 2014-04-05 MED ORDER — GABAPENTIN 100 MG PO CAPS: 100.0000 mg | ORAL_CAPSULE | Freq: Three times a day (TID) | ORAL | Status: DC

## 2014-04-05 MED ORDER — SERTRALINE HCL 25 MG PO TABS: 25.0000 mg | ORAL_TABLET | Freq: Every day | ORAL | Status: DC

## 2014-04-05 MED ORDER — HYDROXYZINE HCL 25 MG PO TABS
25.0000 mg | ORAL_TABLET | Freq: Three times a day (TID) | ORAL | Status: DC | PRN
  Filled 2014-04-05: qty 30

## 2014-04-05 MED ORDER — HYDROXYZINE HCL 25 MG PO TABS: ORAL_TABLET | ORAL | Status: DC

## 2014-04-05 MED ORDER — TRAZODONE HCL 50 MG PO TABS: 50.0000 mg | ORAL_TABLET | Freq: Every evening | ORAL | Status: DC | PRN

## 2014-04-05 MED ORDER — FLUTICASONE-SALMETEROL 100-50 MCG/DOSE IN AEPB: 1.0000 | INHALATION_SPRAY | Freq: Two times a day (BID) | RESPIRATORY_TRACT | Status: DC

## 2014-04-05 MED ORDER — ALBUTEROL SULFATE HFA 108 (90 BASE) MCG/ACT IN AERS: 2.0000 | INHALATION_SPRAY | Freq: Four times a day (QID) | RESPIRATORY_TRACT | Status: DC | PRN

## 2014-04-05 NOTE — Progress Notes (Signed)
Recreation Therapy Notes  Animal-Assisted Activity/Therapy (AAA/T) Program Checklist/Progress Notes Patient Eligibility Criteria Checklist & Daily Group note for Rec Tx Intervention  Date: 07.28.2015 Time: 3:15pm Location: [address]   AAA/T Program Assumption of Risk Form signed by Patient/ or Parent Legal Guardian yes  Patient is free of allergies or sever asthma yes  Patient reports no fear of animals yes  Patient reports no history of cruelty to animals yes   Patient understands his/her participation is voluntary yes  Patient washes hands before animal contact yes  Patient washes hands after animal contact yes  Behavioral Response: Appropriate   Education: Hand Washing, Appropriate Animal Interaction   Education Outcome: Acknowledges understanding   Clinical Observations/Feedback: Patient interacted appropriately with therapeutic dog team and peers in session. Patient asked to leave session at approximately 3:20pm by RN to prepare for d/c.   Laureen Ochs Tylie Golonka, LRT/CTRS  Lane Hacker 04/05/2014 4:29 PM

## 2014-04-05 NOTE — Progress Notes (Signed)
Patient ID: Yvette Gaines, female   DOB: October 11, 1970, 43 y.o.   MRN: [id number] She has been up and interacting with peers and to groups . Has not c/o any discomfort. Self inventory: depression 4 up from 1, hopelessness 0, anxiety 7 up from 2 withdrawals of agitation and  irritability , SI , c/o headache and body aches, goal is to go home and start new. To meet goal: set my O P T .
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
## 2014-04-05 NOTE — Progress Notes (Signed)
The focus of this group is to educate the patient on the purpose and policies of crisis stabilization and provide a format to answer questions about their admission.  The group details unit policies and expectations of patients while admitted. ° °Pt did not attend. °

## 2014-04-05 NOTE — BHH Group Notes (Signed)
[facility] Group Therapy Note  Group Type: Group Therapy- [employer] Guest Speaker  Did not attend.  Peri Maris, [place] 04/05/2014 2:29 PM

## 2014-04-05 NOTE — BHH Suicide Risk Assessment (Addendum)
   Demographic Factors:  Low socioeconomic status and Unemployed  Total Time spent with patient: 20 minutes  Psychiatric Specialty Exam: Physical Exam  Psychiatric: She has a normal mood and affect. Her speech is normal and behavior is normal. Judgment and thought content normal. Cognition and memory are normal.    Review of Systems  Constitutional: Negative.   HENT: Negative.   Eyes: Negative.   Respiratory: Negative.   Cardiovascular: Negative.   Gastrointestinal: Negative.   Genitourinary: Negative.   Musculoskeletal: Negative.   Skin: Negative.   Neurological: Negative.   Endo/Heme/Allergies: Negative.   Psychiatric/Behavioral: Negative.     Blood pressure 96/58, pulse 81, temperature 97.9 F (36.6 C), temperature source Oral, resp. rate 18, height 5\' 2"  (1.575 m), weight 56.7 kg (125 lb), SpO2 100.00%.Body mass index is 22.86 kg/(m^2).  General Appearance: Negative  Eye Contact::  Good  Speech:  Clear and Coherent  Volume:  Normal  Mood:  Euthymic  Affect:  Appropriate  Thought Process:  Goal Directed  Orientation:  Full (Time, Place, and Person)  Thought Content:  Negative  Suicidal Thoughts:  No  Homicidal Thoughts:  No  Memory:  Immediate;   Fair Recent;   Fair Remote;   Fair  Judgement:  Good  Insight:  Fair  Psychomotor Activity:  Normal  Concentration:  Fair  Recall:  [employer] of Knowledge:Fair  Language: Good  Akathisia:  No  Handed:  Right  AIMS (if indicated):     Assets:  Communication Skills Desire for Improvement Physical Health  Sleep:  Number of Hours: 4.5    Musculoskeletal: Strength & Muscle Tone: within normal limits Gait & Station: normal Patient leans: N/A   Mental Status Per Nursing Assessment::   On Admission:  Suicidal ideation indicated by patient;Suicide plan;Self-harm thoughts  Current Mental Status by Physician: patient denies suicidal ideation, intent or plan  Loss Factors: Financial problems/change in  socioeconomic status  Historical Factors: NA  Risk Reduction Factors:   Positive social support  Continued Clinical Symptoms:  Alcohol/Substance Abuse/Dependencies  Cognitive Features That Contribute To Risk:  Closed-mindedness    Suicide Risk:  Minimal: No identifiable suicidal ideation.  Patients presenting with no risk factors but with morbid ruminations; may be classified as minimal risk based on the severity of the depressive symptoms  Discharge Diagnoses:   AXIS I:  Severe opioid use disorder  AXIS II:  Deferred AXIS III:   Past Medical History  Diagnosis Date  . Cancer     ovarian, bladder  . Manic depression   . Asthma    AXIS IV:  other psychosocial or environmental problems and problems related to social environment AXIS V:  61-70 mild symptoms  Plan Of Care/Follow-up recommendations:  Activity:  as tolerated Diet:  healthy Tests:  routine Other:  patient to keep her after care appointment  Is patient on multiple antipsychotic therapies at discharge:  No   Has Patient had three or more failed trials of antipsychotic monotherapy by history:  No  Recommended Plan for Multiple Antipsychotic Therapies: NA    Corena Pilgrim, MD 04/05/2014, 11:39 AM

## 2014-04-05 NOTE — Progress Notes (Signed)
[facility] Adult Case Management Discharge Plan :  Will you be returning to the same living situation after discharge: Yes,  home with dad At discharge, do you have transportation home?:Yes,  father Do you have the ability to pay for your medications:Yes,  mental health  Release of information consent forms completed and submitted to Medical Records by CSW.  Patient to Follow up at: Follow-up Information   Follow up with Tamela Gammon On 04/07/2014. [facility] followup appt-walk in between 7:45AM-11AM on this date. Referral number: [id number])    Contact information:   [place], [place] [address] Phone: [phone] Fax: [phone]      Patient denies SI/HI:   Yes,  during group/self report    Safety Planning and Suicide Prevention discussed:  Yes,  SPE completed with pt's father. SPI pamphlet provided to pt and she was encouraged to share information with support network, ask questions, and talk about any concerns relating to SPE.  Smart, Lael Pilch LCSWA  04/05/2014, 9:44 AM

## 2014-04-05 NOTE — Progress Notes (Signed)
D/C instructions/meds/follow-up appointments reviewed, pt verbalized understanding, pt's belongings returned to pt, samples given. 

## 2014-04-05 NOTE — Progress Notes (Signed)
Patient ID: Yvette Gaines, female   DOB: 04/06/1971, 43 y.o.   MRN: [id number] D: client lying in bed, eyes closed respirations unlabored. A: Writer observed for s/s of distress. Staff will monitor q41min for safety. R: Client is safe on the unit, no distress noted, respirations even.

## 2014-04-05 NOTE — Discharge Summary (Signed)
Physician Discharge Summary Note  Patient:  Yvette Gaines is an 43 y.o., female MRN:  [id number] DOB:  01-06-1971 Patient phone:  [phone] (home)  Patient address:   [place] [address],  Total Time spent with patient: Greater than 30 minutes  Date of Admission:  04/01/2014 Date of Discharge: 04/05/14  Reason for Admission: Opioid detox  Discharge Diagnoses: Principal Problem:   Severe opioid use disorder Active Problems:   Depression   Psychiatric Specialty Exam: Physical Exam  Psychiatric: Her speech is normal and behavior is normal. Judgment and thought content normal. Her mood appears not anxious. Her affect is not angry, not blunt, not labile and not inappropriate. Cognition and memory are normal. She does not exhibit a depressed mood.    Review of Systems  Constitutional: Negative.   HENT: Negative.   Eyes: Negative.   Respiratory: Negative.   Cardiovascular: Negative.   Gastrointestinal: Negative.   Genitourinary: Negative.   Musculoskeletal: Negative.   Skin: Negative.   Neurological: Negative.   Endo/Heme/Allergies: Negative.   Psychiatric/Behavioral: Positive for depression (Stable). Negative for suicidal ideas, hallucinations, memory loss and substance abuse. The patient has insomnia (Stable). The patient is not nervous/anxious.     Blood pressure 96/58, pulse 81, temperature 97.9 F (36.6 C), temperature source Oral, resp. rate 18, height 5\' 2"  (1.575 m), weight 56.7 kg (125 lb), SpO2 100.00%.Body mass index is 22.86 kg/(m^2).   General Appearance: Negative  Eye Contact:: Good  Speech: Clear and Coherent  Volume: Normal  Mood: Euthymic  Affect: Appropriate  Thought Process: Goal Directed  Orientation: Full (Time, Place, and Person)  Thought Content: Negative  Suicidal Thoughts: No  Homicidal Thoughts: No Memory: Immediate; Fair  Recent; Fair  Remote; Fair  Judgement: Good  Insight: Fair  Psychomotor Activity: Normal  Concentration:  Fair  Recall: [employer] of [place]  Language: Good  Akathisia: No  Handed: Right  AIMS (if indicated): Assets: Communication Skills  Desire for Improvement  Physical Health  Sleep: Number of Hours: 4.5   Past Psychiatric History: Diagnosis: Severe opioid use disorder, Major depressive disorder, moderate  Hospitalizations: [name] adult unit  Outpatient Care: [name]: [name]  Self-Mutilation: NA  Suicidal Attempts: NA  Violent Behaviors: NA   Musculoskeletal: Strength & Muscle Tone: within normal limits Gait & Station: normal Patient leans: N/A  DSM5: Schizophrenia Disorders:  NA Obsessive-Compulsive Disorders:  NA Trauma-Stressor Disorders:  NA Substance/Addictive Disorders:  Opioid Disorder - Severe (304.00) Depressive Disorders:  Major Depressive Disorder - Moderate (296.22)  Axis Diagnosis:   AXIS I:  Severe opioid use disorder, Major depressive disorder, moderate AXIS II:  Deferred AXIS III:   Past Medical History  Diagnosis Date  . Cancer     ovarian, bladder  . Manic depression   . Asthma    AXIS IV:  other psychosocial or environmental problems and Drug addiction AXIS V:  62  Level of Care:  OP  Hospital Course:  This is a 43 year old caucasian female who was admitted yesterday from [name] for treatment of Opioid addiction. Patient started using prescribed pain medications during her Cancer treatment. When she could not obtain prescribed Opiates, she started buying from the street. She states over all she has been using Opiates for 5 years. Patient states she decided to seek treatment now because she is tired of wasting her money and time thinking of how and where to get Opiates.  Yvette Gaines was admitted to the hospital with her UDS test reports showing positive opioid. She admitted having been addicted and abusing opiates. She was here for detoxification treatments and a referral to  substance abuse program. Yvette Gaines received Clonidine detox protocols for her opioid detoxification treatments. She also enrolled in the group counseling sessions, AA/NA meetings being offered and held on this unit. She learned coping skills.   Besides the detox treatments, Yvette Gaines was medicated and discharged on Gabapentin 100 mg three times daily for substance withdrawal syndrome, Hydroxyzine 25 mg three times daily as needed for anxiety/tension, Sertraline 25 mg daily for depression and Trazodone 50 mg Q bedtime. She also was resumed on her other pertinent home medications for her other medical issues. She tolerated her treatment regimen without any adverse effects and or reactions.  Yvette Gaines has completed detox treatments and her mood is stable. This is evidenced by her reports of improved mood and absence of substance withdrawal symptoms. She is currently being discharged to continue substance abuse treatment at the [name] in [place], [place]. She is provided with all the necessary information required to make this appointment without problems. She received from the [name], a 2 weeks worth, supply samples of her [name] discharge medications. She left [name] with all belongings in no distress. Transportation per father.  Consults:  psychiatry  Significant Diagnostic Studies:  labs: CBC with diff, CMP, UDS, toxicology tests, U/A  Discharge Vitals:   Blood pressure 96/58, pulse 81, temperature 97.9 F (36.6 C), temperature source Oral, resp. rate 18, height 5\' 2"  (1.575 m), weight 56.7 kg (125 lb), SpO2 100.00%. Body mass index is 22.86 kg/(m^2). Lab Results:   No results found for this or any previous visit (from the past 72 hour(s)).  Physical Findings: AIMS: Facial and Oral Movements Muscles of Facial Expression: None, normal Lips and Perioral Area: None, normal Jaw: None, normal Tongue: None, normal,Extremity Movements Upper (arms, wrists, hands, fingers): None, normal Lower (legs, knees,  ankles, toes): None, normal, Trunk Movements Neck, shoulders, hips: None, normal, Overall Severity Severity of abnormal movements (highest score from questions above): None, normal Incapacitation due to abnormal movements: None, normal Patient's awareness of abnormal movements (rate only patient's report): No Awareness, Dental Status Current problems with teeth and/or dentures?: No Does patient usually wear dentures?: No  CIWA:  CIWA-Ar Total: 3 COWS:  COWS Total Score: 0  Psychiatric Specialty Exam: See Psychiatric Specialty Exam and Suicide Risk Assessment completed by Attending Physician prior to discharge.  Discharge destination:  Home  Is patient on multiple antipsychotic therapies at discharge:  No   Has Patient had three or more failed trials of antipsychotic monotherapy by history:  No  Recommended Plan for Multiple Antipsychotic Therapies: NA     Medication List    STOP taking these medications       cyclobenzaprine 10 MG tablet  Commonly known as:  FLEXERIL     escitalopram 10 MG tablet  Commonly known as:  LEXAPRO     NUVIGIL 250 MG tablet  Generic drug:  Armodafinil     oxycodone 30 MG immediate release tablet  Commonly known as:  ROXICODONE      TAKE these medications     Indication   albuterol 108 (90 BASE) MCG/ACT inhaler  Commonly known as:  PROVENTIL HFA;VENTOLIN HFA  Inhale 2 puffs into the lungs every 6 (six) hours as needed for wheezing or shortness of breath.   Indication:  Asthma     Fluticasone-Salmeterol 100-50 MCG/DOSE Aepb  Commonly known as:  ADVAIR  Inhale 1 puff into the lungs 2 (two) times daily. For asthma   Indication:  Asthma     gabapentin 100 MG capsule  Commonly known as:  NEURONTIN  Take 1 capsule (100 mg total) by mouth 3 (three) times daily. For substance withdrawal syndrome   Indication:  Agitation, Substance withdrawal syndrome     hydrOXYzine 25 MG tablet  Commonly known as:  ATARAX/VISTARIL  Take 1 tablet (25 mg)  three times daily as needed: For anxiety   Indication:  Anxiety associated with Organic Disease, Tension     sertraline 25 MG tablet  Commonly known as:  ZOLOFT  Take 1 tablet (25 mg total) by mouth daily. For depression   Indication:  Anxiety Disorder, Major Depressive Disorder, Posttraumatic Stress Disorder     traZODone 50 MG tablet  Commonly known as:  DESYREL  Take 1 tablet (50 mg total) by mouth at bedtime as needed for sleep.   Indication:  Trouble Sleeping       Follow-up Information   Follow up with Tamela Gammon On 04/07/2014. [facility] followup appt-walk in between 7:45AM-11AM on this date. Referral number: [id number])    Contact information:   [address], [place] [address] Phone: [phone] Fax: [phone]     Follow-up recommendations: Activity:  As tolerated Diet: As recommended by your primary care doctor. Keep all scheduled follow-up appointments as recommended.   Comments:  Take all your medications as prescribed by your mental healthcare provider. Report any adverse effects and or reactions from your medicines to your outpatient provider promptly. Patient is instructed and cautioned to not engage in alcohol and or illegal drug use while on prescription medicines. In the event of worsening symptoms, patient is instructed to call the crisis hotline, 911 and or go to the nearest ED for appropriate evaluation and treatment of symptoms. Follow-up with your primary care provider for your other medical issues, concerns and or health care needs.   Total Discharge Time:  Greater than 30 minutes.  Signed: Encarnacion Slates, [place] 04/06/2014, 10:50 AM  Patient seen, evaluated and I agree with notes by Nurse Practitioner. Corena Pilgrim, MD

## 2014-04-08 NOTE — Progress Notes (Signed)
Patient Discharge Instructions:  After Visit Summary (AVS):   Faxed to:  04/08/14 Discharge Summary Note:   Faxed to:  04/08/14 Psychiatric Admission Assessment Note:   Faxed to:  04/08/14 Suicide Risk Assessment - Discharge Assessment:   Faxed to:  04/08/14 Faxed/Sent to the Next Level Care provider:  04/08/14 Faxed to [facility] @ [place], 04/08/2014, 3:49 PM

## 2015-02-28 ENCOUNTER — Emergency Department (HOSPITAL_COMMUNITY)
Admission: EM | Admit: 2015-02-28 | Discharge: 2015-02-28 | Disposition: A | Discharge status: Home / Self Care | Payer: Self-pay | Attending: Emergency Medicine | Admitting: Emergency Medicine

## 2015-02-28 ENCOUNTER — Encounter (HOSPITAL_COMMUNITY): Payer: Self-pay

## 2015-02-28 DIAGNOSIS — Z720 Tobacco use: Secondary | ICD-10-CM | POA: Insufficient documentation

## 2015-02-28 DIAGNOSIS — Z79899 Other long term (current) drug therapy: Secondary | ICD-10-CM | POA: Insufficient documentation

## 2015-02-28 DIAGNOSIS — Z7951 Long term (current) use of inhaled steroids: Secondary | ICD-10-CM | POA: Insufficient documentation

## 2015-02-28 DIAGNOSIS — J45909 Unspecified asthma, uncomplicated: Secondary | ICD-10-CM | POA: Insufficient documentation

## 2015-02-28 DIAGNOSIS — Z8543 Personal history of malignant neoplasm of ovary: Secondary | ICD-10-CM | POA: Insufficient documentation

## 2015-02-28 DIAGNOSIS — K130 Diseases of lips: Secondary | ICD-10-CM | POA: Insufficient documentation

## 2015-02-28 DIAGNOSIS — Z8551 Personal history of malignant neoplasm of bladder: Secondary | ICD-10-CM | POA: Insufficient documentation

## 2015-02-28 DIAGNOSIS — B002 Herpesviral gingivostomatitis and pharyngotonsillitis: Secondary | ICD-10-CM | POA: Insufficient documentation

## 2015-02-28 DIAGNOSIS — Z8659 Personal history of other mental and behavioral disorders: Secondary | ICD-10-CM | POA: Insufficient documentation

## 2015-02-28 DIAGNOSIS — B009 Herpesviral infection, unspecified: Secondary | ICD-10-CM

## 2015-02-28 LAB — CBC WITH DIFFERENTIAL/PLATELET
BASOS ABS: 0 10*3/uL (ref 0.0–0.1)
Basophils Relative: 0 % (ref 0–1)
EOS ABS: 0.1 10*3/uL (ref 0.0–0.7)
EOS PCT: 1 % (ref 0–5)
HEMATOCRIT: 45.6 % (ref 36.0–46.0)
Hemoglobin: 15.8 g/dL — ABNORMAL HIGH (ref 12.0–15.0)
LYMPHS PCT: 16 % (ref 12–46)
Lymphs Abs: 1.9 10*3/uL (ref 0.7–4.0)
MCH: 33.1 pg (ref 26.0–34.0)
MCHC: 34.6 g/dL (ref 30.0–36.0)
MCV: 95.6 fL (ref 78.0–100.0)
MONO ABS: 0.9 10*3/uL (ref 0.1–1.0)
Monocytes Relative: 8 % (ref 3–12)
Neutro Abs: 9 10*3/uL — ABNORMAL HIGH (ref 1.7–7.7)
Neutrophils Relative %: 75 % (ref 43–77)
PLATELETS: 249 10*3/uL (ref 150–400)
RBC: 4.77 MIL/uL (ref 3.87–5.11)
RDW: 14.4 % (ref 11.5–15.5)
WBC: 12 10*3/uL — ABNORMAL HIGH (ref 4.0–10.5)

## 2015-02-28 LAB — BASIC METABOLIC PANEL
ANION GAP: 7 (ref 5–15)
BUN: 9 mg/dL (ref 6–20)
CO2: 22 mmol/L (ref 22–32)
CREATININE: 0.72 mg/dL (ref 0.44–1.00)
Calcium: 8.5 mg/dL — ABNORMAL LOW (ref 8.9–10.3)
Chloride: 107 mmol/L (ref 101–111)
Glucose, Bld: 96 mg/dL (ref 65–99)
POTASSIUM: 3.9 mmol/L (ref 3.5–5.1)
SODIUM: 136 mmol/L (ref 135–145)

## 2015-02-28 MED ORDER — KETOROLAC TROMETHAMINE 30 MG/ML IJ SOLN
30.0000 mg | Freq: Once | INTRAMUSCULAR | Status: AC
Start: 2015-02-28 — End: 2015-02-28
  Administered 2015-02-28: 30 mg via INTRAVENOUS
  Filled 2015-02-28: qty 1

## 2015-02-28 MED ORDER — DEXAMETHASONE SODIUM PHOSPHATE 4 MG/ML IJ SOLN
8.0000 mg | Freq: Once | INTRAMUSCULAR | Status: AC
  Administered 2015-02-28: 8 mg via INTRAVENOUS
  Filled 2015-02-28: qty 2

## 2015-02-28 MED ORDER — ACYCLOVIR 800 MG PO TABS: 800.0000 mg | ORAL_TABLET | Freq: Every day | ORAL | Status: DC

## 2015-02-28 MED ORDER — CLINDAMYCIN PHOSPHATE 600 MG/50ML IV SOLN
600.0000 mg | Freq: Once | INTRAVENOUS | Status: AC
  Administered 2015-02-28: 600 mg via INTRAVENOUS
  Filled 2015-02-28: qty 50

## 2015-02-28 MED ORDER — TETANUS-DIPHTH-ACELL PERTUSSIS 5-2.5-18.5 LF-MCG/0.5 IM SUSP: 0.5000 mL | Freq: Once | INTRAMUSCULAR | Status: DC

## 2015-02-28 MED ORDER — MORPHINE SULFATE 4 MG/ML IJ SOLN
4.0000 mg | INTRAMUSCULAR | Status: DC | PRN
  Administered 2015-02-28 (×2): 4 mg via INTRAVENOUS
  Filled 2015-02-28 (×2): qty 1

## 2015-02-28 MED ORDER — DEXTROSE 5 % IV SOLN
250.0000 mg | Freq: Once | INTRAVENOUS | Status: AC
  Administered 2015-02-28: 250 mg via INTRAVENOUS
  Filled 2015-02-28: qty 5

## 2015-02-28 MED ORDER — SODIUM CHLORIDE 0.9 % IV BOLUS (SEPSIS)
1000.0000 mL | Freq: Once | INTRAVENOUS | Status: AC
  Administered 2015-02-28: 1000 mL via INTRAVENOUS

## 2015-02-28 MED ORDER — CEPHALEXIN 500 MG PO CAPS: 500.0000 mg | ORAL_CAPSULE | Freq: Three times a day (TID) | ORAL | Status: DC

## 2015-02-28 MED ORDER — OXYCODONE-ACETAMINOPHEN 5-325 MG PO TABS: 1.0000 | ORAL_TABLET | Freq: Four times a day (QID) | ORAL | Status: DC | PRN

## 2015-02-28 MED ORDER — CEFAZOLIN SODIUM 1-5 GM-% IV SOLN: 1.0000 g | Freq: Once | INTRAVENOUS | Status: DC

## 2015-02-28 NOTE — ED Notes (Signed)
Pt alert & oriented x4, stable gait. Patient given discharge instructions, paperwork & prescription(s). Patient  instructed to stop at the registration desk to finish any additional paperwork. Patient verbalized understanding. Pt left department w/ no further questions. 

## 2015-02-28 NOTE — ED Notes (Signed)
Pt reports went to [facility] hospital yesterday for sores in mouth.  Reports was given IV antibiotics but says feels like infection is worse.

## 2015-02-28 NOTE — Discharge Instructions (Signed)
Continue antibiotic and anti-viral medication for 5 additional days after initial prescription finished. Also refill for pain medication. Hot warm wet compress to lips. Increase fluids. Return if worse.

## 2015-02-28 NOTE — Progress Notes (Signed)
ANTIBIOTIC CONSULT NOTE - INITIAL  Pharmacy Consult for Acyclovir Indication: mucocutaneous infection, mouth sores  No Known Allergies  Patient Measurements: Height: 5\' 2"  (157.5 cm) Weight: 128 lb (58.06 kg) IBW/kg (Calculated) : 50.1  Vital Signs: Temp: 98.2 F (36.8 C) (06/21 1140) Temp Source: Oral (06/21 1140) BP: 147/110 mmHg (06/21 1140) Pulse Rate: 94 (06/21 1140) Intake/Output from previous day:   Intake/Output from this shift:    Labs: No results for input(s): WBC, HGB, PLT, LABCREA, CREATININE in the last 72 hours. CrCl cannot be calculated (Patient has no serum creatinine result on file.). No results for input(s): VANCOTROUGH, VANCOPEAK, VANCORANDOM, GENTTROUGH, GENTPEAK, GENTRANDOM, TOBRATROUGH, TOBRAPEAK, TOBRARND, AMIKACINPEAK, AMIKACINTROU, AMIKACIN in the last 72 hours.   Microbiology: No results found for this or any previous visit (from the past 720 hour(s)).  Medical History: Past Medical History  Diagnosis Date  . Cancer     ovarian, bladder  . Manic depression   . Asthma    Assessment: 44yo female with c/o sores in mouth.  Went to [place] yesterday and was given IV antibiotics but feels like infection is worse.  Asked to initiate Acyclovir IV.  Labs pending  Goal of Therapy:  Eradicate infection.  Plan:  Acyclovir 5mg /Kg IV now and q8hrs (using IBW) Switch to PO acyclovir in 2-7 days if improving Anticipate 10 days Rx total Monitor labs, progress, and renal fxn  Hart Robinsons A 02/28/2015,2:04 PM

## 2015-02-28 NOTE — ED Notes (Signed)
Pt ready for DC per pt.

## 2015-02-28 NOTE — ED Notes (Signed)
Pt has red swollen lips that started as a sore to the side of her mouth 5 days prior and has been progressively getting worse, pt states she can feel her glands under her chin and they are swollen.

## 2015-02-28 NOTE — ED Provider Notes (Signed)
CSN: [id number]     Arrival date & time 02/28/15  1135 History   First MD Initiated Contact with Patient 02/28/15 1316     Chief Complaint  Patient presents with  . mouth infection      (Consider location/radiation/quality/duration/timing/severity/associated sxs/prior Treatment) HPI..... Patient presents with swelling and tenderness of lower lip with associated tenderness in the submental area. She was seen yesterday at [facility] and prescribed acyclovir and Keflex along with pain medicine. No fevers, sweats, chills. She is not immunocompromised. Remote history of uterine and ovarian cancer. Severity is moderate. She is able to swallow moderate amounts of fluid.  Past Medical History  Diagnosis Date  . Cancer     ovarian, bladder  . Manic depression   . Asthma    Past Surgical History  Procedure Laterality Date  . Abdominal hysterectomy    . Partial bladder removal     No family history on file. History  Substance Use Topics  . Smoking status: Current Every Day Smoker -- 1.00 packs/day    Types: Cigarettes  . Smokeless tobacco: Not on file  . Alcohol Use: No   OB History    No data available     Review of Systems  All other systems reviewed and are negative.     Allergies  Review of patient's allergies indicates no known allergies.  Home Medications   Prior to Admission medications   Medication Sig Start Date End Date Taking? Authorizing Provider  albuterol (PROVENTIL HFA;VENTOLIN HFA) 108 (90 BASE) MCG/ACT inhaler Inhale 2 puffs into the lungs every 6 (six) hours as needed for wheezing or shortness of breath. 04/05/14  Yes Encarnacion Slates, NP  Fluticasone-Salmeterol (ADVAIR) 100-50 MCG/DOSE AEPB Inhale 1 puff into the lungs 2 (two) times daily. For asthma 04/05/14  Yes Encarnacion Slates, NP  HYDROcodone-acetaminophen (NORCO/VICODIN) 5-325 MG per tablet Take 1 tablet by mouth 3 (three) times daily before meals.   Yes Historical Provider, MD  ibuprofen  (ADVIL,MOTRIN) 200 MG tablet Take 400 mg by mouth every 6 (six) hours as needed.   Yes Historical Provider, MD  acyclovir (ZOVIRAX) 800 MG tablet Take 1 tablet (800 mg total) by mouth 5 (five) times daily. 02/28/15   Nat Christen, MD  cephALEXin (KEFLEX) 500 MG capsule Take 1 capsule (500 mg total) by mouth 3 (three) times daily. 02/28/15   Nat Christen, MD  oxyCODONE-acetaminophen (PERCOCET/ROXICET) 5-325 MG per tablet Take 1-2 tablets by mouth every 6 (six) hours as needed. 02/28/15   Nat Christen, MD   BP 123/89 mmHg  Pulse 75  Temp(Src) 98.1 F (36.7 C) (Oral)  Resp 18  Ht 5\' 2"  (1.575 m)  Wt 128 lb (58.06 kg)  BMI 23.41 kg/m2  SpO2 100% Physical Exam  Constitutional: She is oriented to person, place, and time. She appears well-developed and well-nourished.  HENT:  Head: Normocephalic and atraumatic.  Eyes: Conjunctivae and EOM are normal. Pupils are equal, round, and reactive to light.  Neck: Normal range of motion. Neck supple.  Submental area tender, but no obvious adenopathy. Patient is able to swallow.  Cardiovascular: Normal rate and regular rhythm.   Pulmonary/Chest: Effort normal and breath sounds normal.  Abdominal: Soft. Bowel sounds are normal.  Musculoskeletal: Normal range of motion.  Neurological: She is alert and oriented to person, place, and time.  Skin:  Lower lip: Edematous, indurated, puffy c scabs and excoriation.  Psychiatric: She has a normal mood and affect. Her behavior is normal.  Nursing note and  vitals reviewed.   ED Course  Procedures (including critical care time) Labs Review Labs Reviewed  CBC WITH DIFFERENTIAL/PLATELET - Abnormal; Notable for the following:    WBC 12.0 (*)    Hemoglobin 15.8 (*)    Neutro Abs 9.0 (*)    All other components within normal limits  BASIC METABOLIC PANEL - Abnormal; Notable for the following:    Calcium 8.5 (*)    All other components within normal limits    Imaging Review No results found.   EKG  Interpretation None      MDM   Final diagnoses:  Herpes simplex type 1 infection  Abscess, lip    I suspect this is a herpes simplex virus type I infection with associated abscess/cellulitis. Rx IV fluids, IV steroids, IV acyclovir, IV clindamycin. Patient feels much better after 2 L of IV fluids. She'll continue with acyclovir and cephalexin orally as out patient..  Additional prescriptions for 5 more days initiated. Patient will return if worse.    Nat Christen, MD 02/28/15 2145

## 2015-06-20 ENCOUNTER — Ambulatory Visit: Payer: BLUE CROSS/BLUE SHIELD | Admitting: Family Medicine

## 2015-06-20 ENCOUNTER — Telehealth: Payer: Self-pay | Admitting: General Practice

## 2015-06-20 ENCOUNTER — Telehealth: Payer: Self-pay | Admitting: Family Medicine

## 2015-06-20 NOTE — Telephone Encounter (Signed)
FYI, Pt called at 2:25pm stating that she has to work til 7:00pm and cannot make her appointment today. Thank You!

## 2015-06-28 ENCOUNTER — Emergency Department (HOSPITAL_COMMUNITY): Payer: BLUE CROSS/BLUE SHIELD

## 2015-06-28 ENCOUNTER — Emergency Department (HOSPITAL_COMMUNITY)
Admission: EM | Admit: 2015-06-28 | Discharge: 2015-06-28 | Disposition: A | Discharge status: Home / Self Care | Payer: BLUE CROSS/BLUE SHIELD | Attending: Emergency Medicine | Admitting: Emergency Medicine

## 2015-06-28 ENCOUNTER — Encounter (HOSPITAL_COMMUNITY): Payer: Self-pay | Admitting: Emergency Medicine

## 2015-06-28 DIAGNOSIS — R1031 Right lower quadrant pain: Secondary | ICD-10-CM | POA: Insufficient documentation

## 2015-06-28 DIAGNOSIS — Z8543 Personal history of malignant neoplasm of ovary: Secondary | ICD-10-CM | POA: Insufficient documentation

## 2015-06-28 DIAGNOSIS — Z79899 Other long term (current) drug therapy: Secondary | ICD-10-CM | POA: Diagnosis not present

## 2015-06-28 DIAGNOSIS — R350 Frequency of micturition: Secondary | ICD-10-CM | POA: Insufficient documentation

## 2015-06-28 DIAGNOSIS — Z8659 Personal history of other mental and behavioral disorders: Secondary | ICD-10-CM | POA: Diagnosis not present

## 2015-06-28 DIAGNOSIS — J45909 Unspecified asthma, uncomplicated: Secondary | ICD-10-CM | POA: Diagnosis not present

## 2015-06-28 DIAGNOSIS — R3915 Urgency of urination: Secondary | ICD-10-CM | POA: Diagnosis not present

## 2015-06-28 DIAGNOSIS — R21 Rash and other nonspecific skin eruption: Secondary | ICD-10-CM | POA: Insufficient documentation

## 2015-06-28 DIAGNOSIS — R110 Nausea: Secondary | ICD-10-CM | POA: Insufficient documentation

## 2015-06-28 DIAGNOSIS — I10 Essential (primary) hypertension: Secondary | ICD-10-CM | POA: Insufficient documentation

## 2015-06-28 DIAGNOSIS — Z8551 Personal history of malignant neoplasm of bladder: Secondary | ICD-10-CM | POA: Diagnosis not present

## 2015-06-28 LAB — CBC WITH DIFFERENTIAL/PLATELET
BASOS ABS: 0 10*3/uL (ref 0.0–0.1)
BASOS PCT: 0 %
EOS ABS: 0.1 10*3/uL (ref 0.0–0.7)
EOS PCT: 1 %
HCT: 42.9 % (ref 36.0–46.0)
HEMOGLOBIN: 15 g/dL (ref 12.0–15.0)
Lymphocytes Relative: 25 %
Lymphs Abs: 1.9 10*3/uL (ref 0.7–4.0)
MCH: 33.4 pg (ref 26.0–34.0)
MCHC: 35 g/dL (ref 30.0–36.0)
MCV: 95.5 fL (ref 78.0–100.0)
Monocytes Absolute: 0.3 10*3/uL (ref 0.1–1.0)
Monocytes Relative: 4 %
Neutro Abs: 5.4 10*3/uL (ref 1.7–7.7)
Neutrophils Relative %: 70 %
Platelets: 274 10*3/uL (ref 150–400)
RBC: 4.49 MIL/uL (ref 3.87–5.11)
RDW: 13.4 % (ref 11.5–15.5)
WBC: 7.7 10*3/uL (ref 4.0–10.5)

## 2015-06-28 LAB — URINALYSIS, ROUTINE W REFLEX MICROSCOPIC
Bilirubin Urine: NEGATIVE
Glucose, UA: NEGATIVE mg/dL
Hgb urine dipstick: NEGATIVE
Ketones, ur: NEGATIVE mg/dL
LEUKOCYTES UA: NEGATIVE
NITRITE: NEGATIVE
Protein, ur: NEGATIVE mg/dL
Specific Gravity, Urine: 1.015 (ref 1.005–1.030)
UROBILINOGEN UA: 0.2 mg/dL (ref 0.0–1.0)
pH: 5.5 (ref 5.0–8.0)

## 2015-06-28 LAB — COMPREHENSIVE METABOLIC PANEL
ALBUMIN: 4.1 g/dL (ref 3.5–5.0)
ALK PHOS: 51 U/L (ref 38–126)
ALT: 11 U/L — AB (ref 14–54)
AST: 17 U/L (ref 15–41)
Anion gap: 8 (ref 5–15)
BUN: 16 mg/dL (ref 6–20)
CALCIUM: 8.8 mg/dL — AB (ref 8.9–10.3)
CHLORIDE: 108 mmol/L (ref 101–111)
CO2: 22 mmol/L (ref 22–32)
CREATININE: 0.7 mg/dL (ref 0.44–1.00)
GFR calc non Af Amer: >60 mL/min (ref >60)
Glucose, Bld: 90 mg/dL (ref 65–99)
Potassium: 3.5 mmol/L (ref 3.5–5.1)
Sodium: 138 mmol/L (ref 135–145)
Total Bilirubin: 0.4 mg/dL (ref 0.3–1.2)
Total Protein: 6.9 g/dL (ref 6.5–8.1)

## 2015-06-28 LAB — LIPASE, BLOOD: Lipase: 35 U/L (ref 22–51)

## 2015-06-28 IMAGING — CT CT ABD-PELV W/ CM
2 of 4 series · 17 of 46 positions shown, 19 images · IV contrast (Omnipaque 300)
Comparison: None.

CLINICAL DATA: Abdominal pain on and off for 2 weeks

EXAM:
CT ABDOMEN AND PELVIS WITH CONTRAST
TECHNIQUE: Multidetector CT imaging of the abdomen and pelvis was performed
using the standard protocol following bolus administration of
intravenous contrast.
CONTRAST:  50mL OMNIPAQUE IOHEXOL 300 MG/ML SOLN, 100mL OMNIPAQUE
IOHEXOL 300 MG/ML SOLN

[Series 2: abd_pel_with 5.0 b40f · axial · 0.69mm/px · z∈[-308,+76]mm · 14 of 85 slices shown, 16 images]
[im 4/85  soft-tissue]
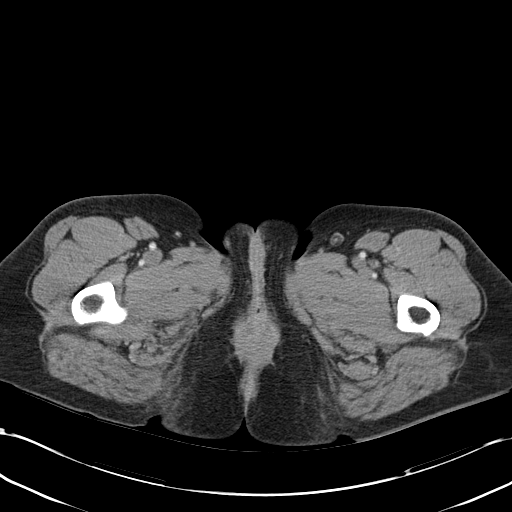
[im 4/85  bone]
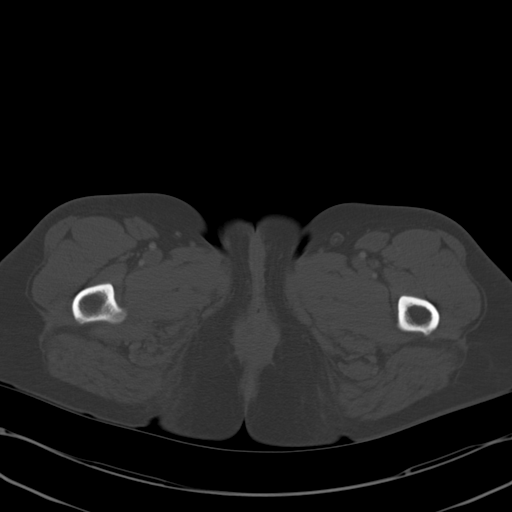
[im 12/85  soft-tissue]
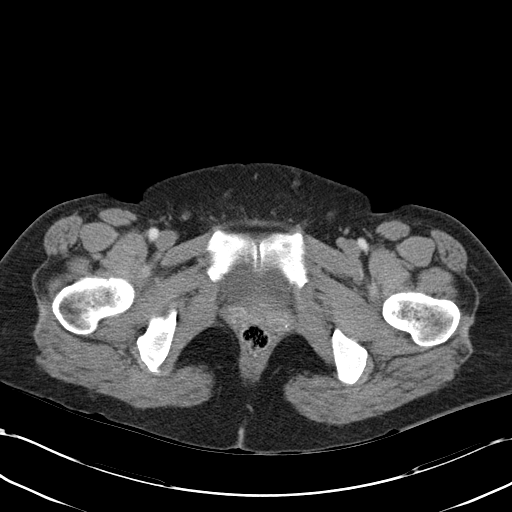
[im 16/85  soft-tissue]
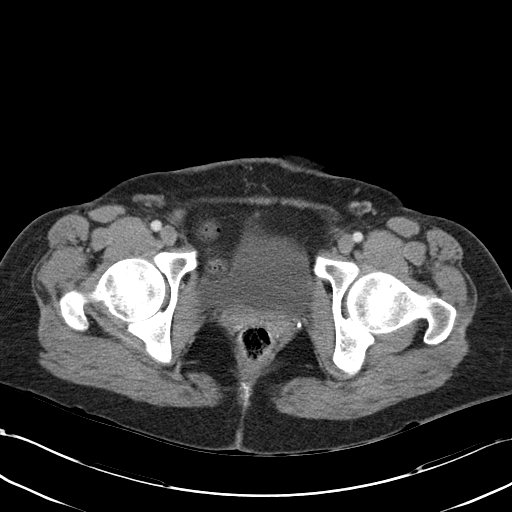
[im 23/85  soft-tissue]
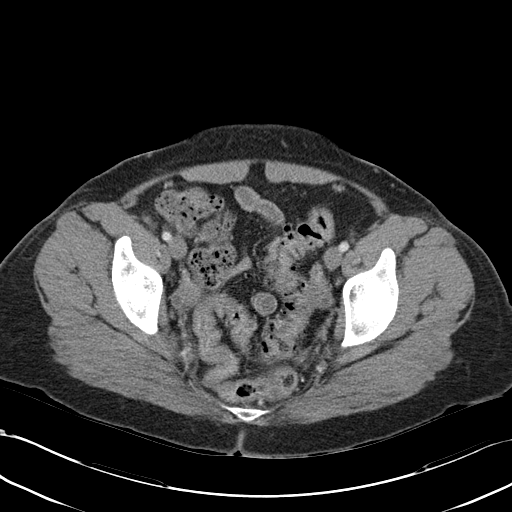
[im 27/85  soft-tissue]
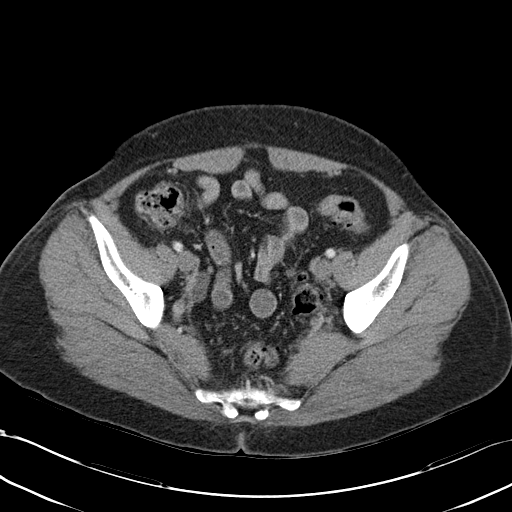
[im 35/85  soft-tissue]
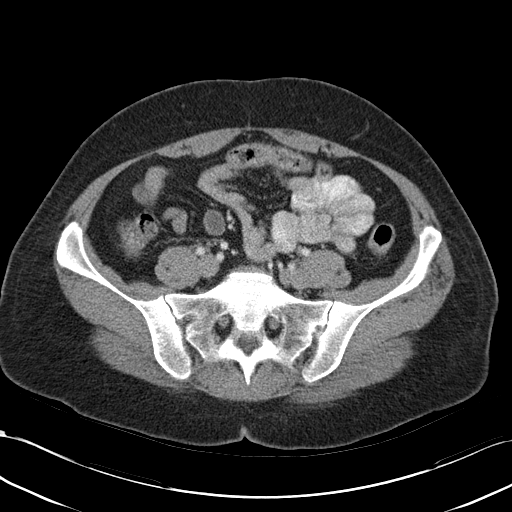
[im 39/85  soft-tissue]
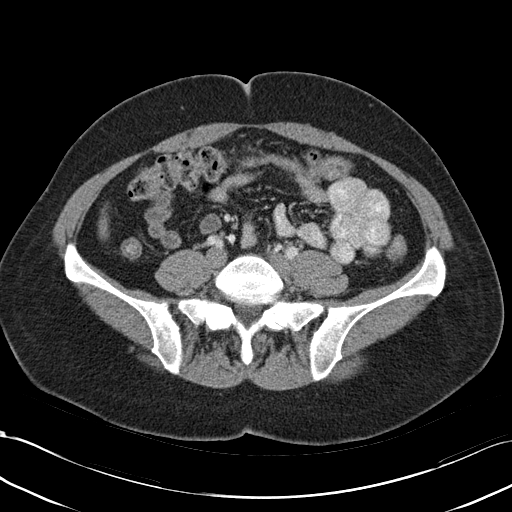
[im 46/85  soft-tissue]
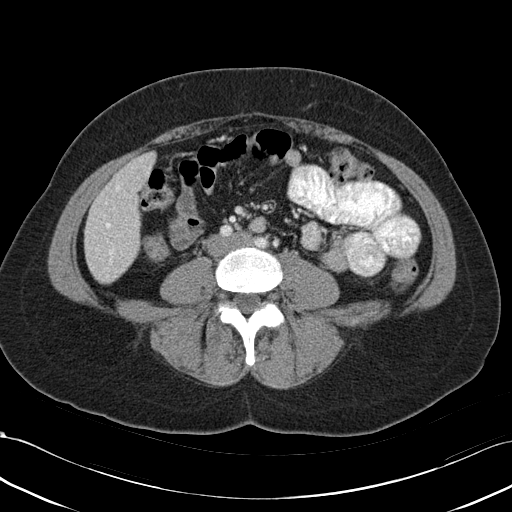
[im 50/85  soft-tissue]
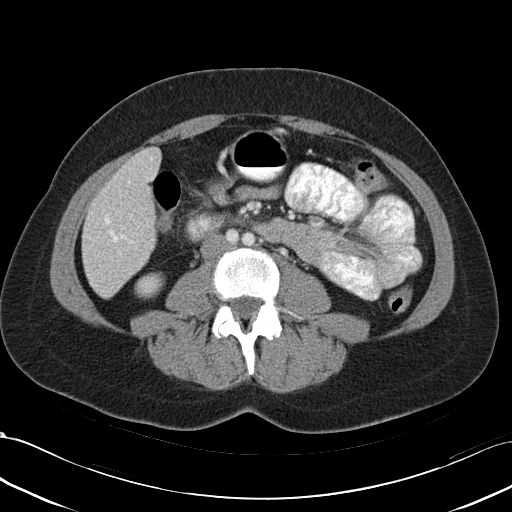
[im 50/85  bone]
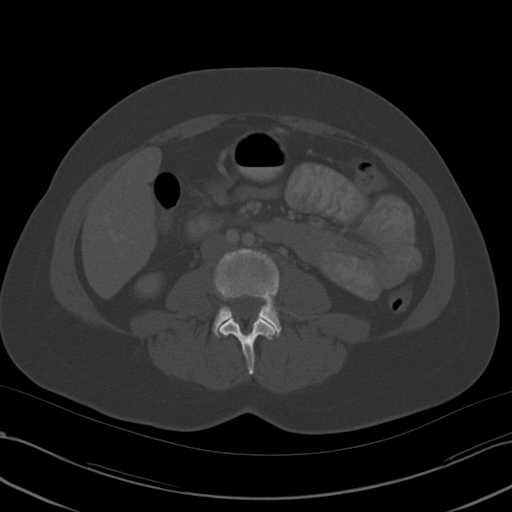
[im 58/85  soft-tissue]
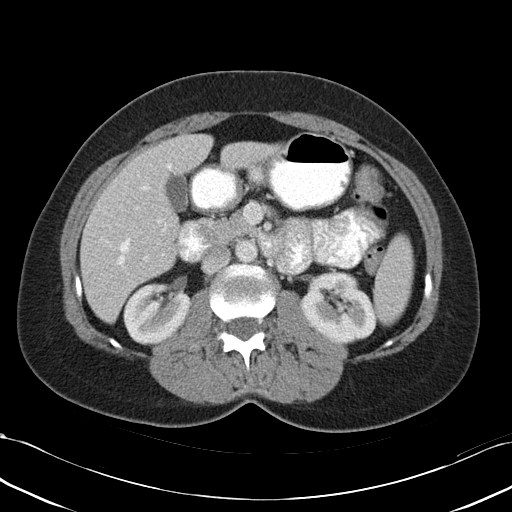
[im 62/85  soft-tissue]
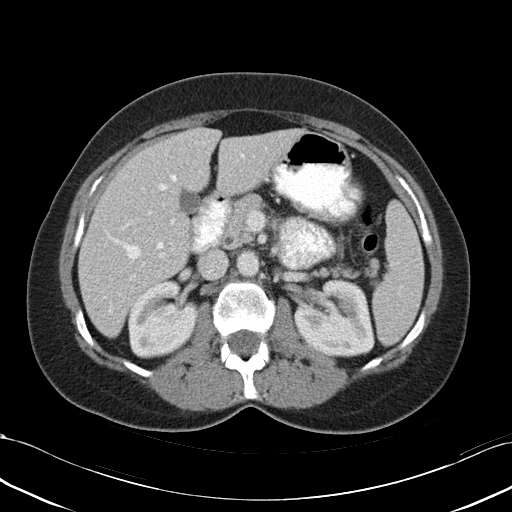
[im 69/85  soft-tissue]
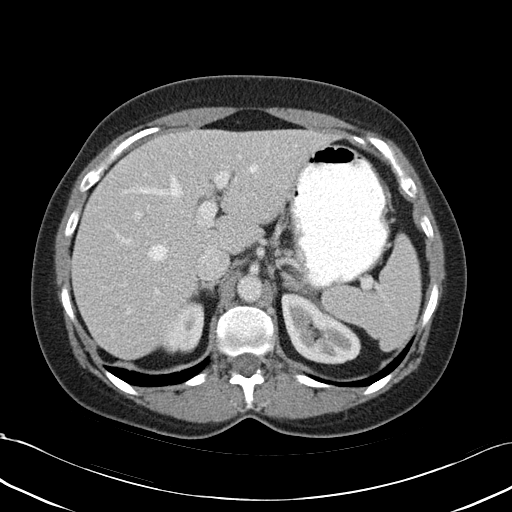
[im 73/85  soft-tissue]
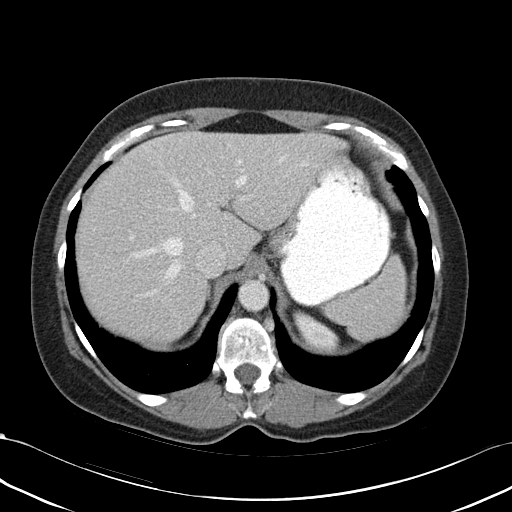
[im 81/85  soft-tissue]
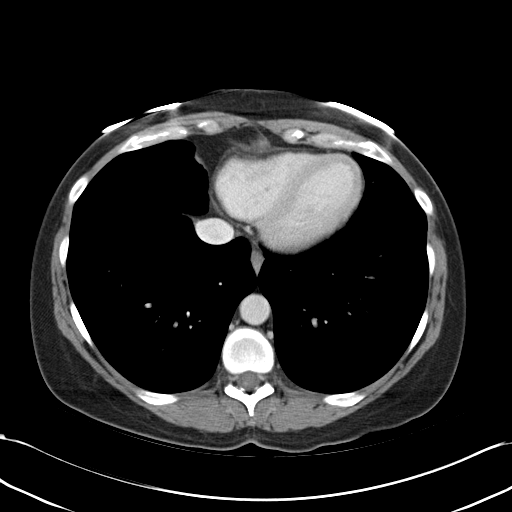

[Series 3: abd_pel_with 3.0 spo cor · coronal · 0.69mm/px · 3 of 80 slices shown]
[im 27/80  soft-tissue]
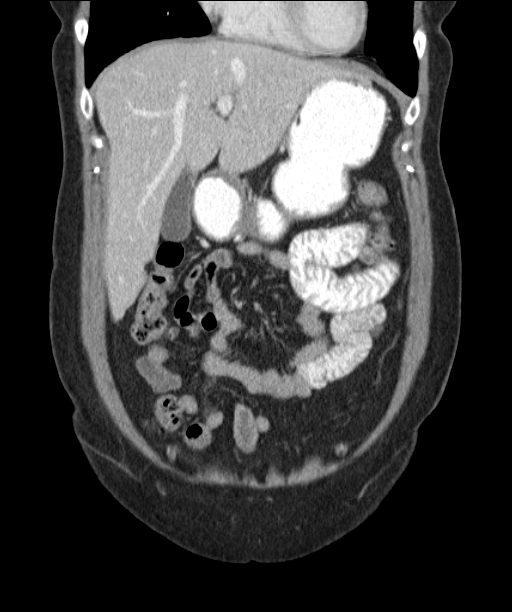
[im 36/80  soft-tissue]
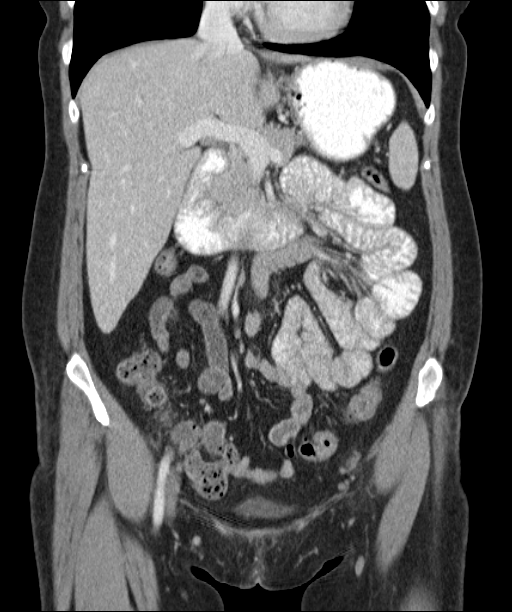
[im 44/80  soft-tissue]
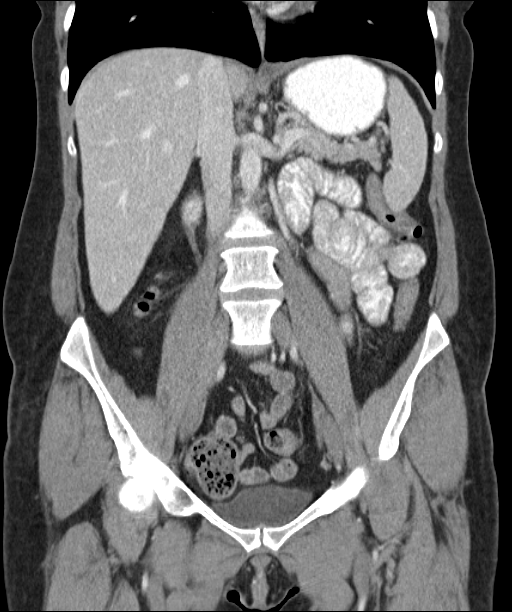

[17 of 46 positions shown; findings below may reference images not displayed]

FINDINGS: Lower chest and abdominal wall:  No contributory findings.

Hepatobiliary: No focal liver abnormality.No evidence of biliary
obstruction or stone.

Pancreas: Unremarkable.

Spleen: Unremarkable.

Adrenals/Urinary Tract: Negative adrenals. No hydronephrosis or
stone. Unremarkable bladder.

Reproductive:Hysterectomy with unremarkable adnexa.

Stomach/Bowel:  No obstruction. No appendicitis.

Vascular/Lymphatic: No acute vascular abnormality. No mass or
adenopathy.

Peritoneal: Trace pelvic fluid which may be physiologic.

Musculoskeletal: No acute abnormalities.
IMPRESSION: Negative.  No explanation for abdominal pain.

## 2015-06-28 MED ORDER — SODIUM CHLORIDE 0.9 % IV SOLN: INTRAVENOUS | Status: DC

## 2015-06-28 MED ORDER — ONDANSETRON HCL 4 MG/2ML IJ SOLN
4.0000 mg | Freq: Once | INTRAMUSCULAR | Status: AC
  Administered 2015-06-28: 4 mg via INTRAVENOUS
  Filled 2015-06-28: qty 2

## 2015-06-28 MED ORDER — IOHEXOL 300 MG/ML  SOLN
50.0000 mL | Freq: Once | INTRAMUSCULAR | Status: AC | PRN
  Administered 2015-06-28: 50 mL via ORAL

## 2015-06-28 MED ORDER — HYDROCODONE-ACETAMINOPHEN 5-325 MG PO TABS: 1.0000 | ORAL_TABLET | Freq: Four times a day (QID) | ORAL | Status: DC | PRN

## 2015-06-28 MED ORDER — ONDANSETRON HCL 4 MG/2ML IJ SOLN: 4.0000 mg | Freq: Once | INTRAMUSCULAR | Status: DC

## 2015-06-28 MED ORDER — IOHEXOL 300 MG/ML  SOLN
100.0000 mL | Freq: Once | INTRAMUSCULAR | Status: AC | PRN
  Administered 2015-06-28: 100 mL via INTRAVENOUS

## 2015-06-28 MED ORDER — NAPROXEN 500 MG PO TABS: 500.0000 mg | ORAL_TABLET | Freq: Two times a day (BID) | ORAL | Status: DC

## 2015-06-28 MED ORDER — FENTANYL CITRATE (PF) 100 MCG/2ML IJ SOLN
50.0000 ug | Freq: Once | INTRAMUSCULAR | Status: AC
  Administered 2015-06-28: 50 ug via INTRAVENOUS
  Filled 2015-06-28: qty 2

## 2015-06-28 MED ORDER — PROMETHAZINE HCL 25 MG PO TABS: 25.0000 mg | ORAL_TABLET | Freq: Four times a day (QID) | ORAL | Status: DC | PRN

## 2015-06-28 MED ORDER — SODIUM CHLORIDE 0.9 % IV BOLUS (SEPSIS)
500.0000 mL | Freq: Once | INTRAVENOUS | Status: AC
  Administered 2015-06-28: 500 mL via INTRAVENOUS

## 2015-06-28 NOTE — ED Notes (Signed)
Pt states that she has been having pain on and off for about 2 weeks- made appointment for Md for next Tuesday - but pain has worstened and this am when she wiped there was a white milky mucus like plug - Pain is pimarily Rt side

## 2015-06-28 NOTE — Discharge Instructions (Signed)
Workup for the abdominal pain without any significant findings. Recommend follow-up with GYN. Take medications as directed.

## 2015-06-28 NOTE — ED Provider Notes (Addendum)
CSN: [id number]     Arrival date & time 06/28/15  1118 History  By signing my name below, I, Stephania Fragmin, attest that this documentation has been prepared under the direction and in the presence of Fredia Sorrow, MD. Electronically Signed: Stephania Fragmin, ED Scribe. 06/28/2015. 1:01 PM.   Chief Complaint  Patient presents with  . Abdominal Pain   Patient is a 44 y.o. female presenting with abdominal pain. The history is provided by the patient. No language interpreter was used.  Abdominal Pain Pain location:  RLQ Pain quality: aching   Pain radiates to:  Back and R leg Pain severity:  Severe Onset quality:  Gradual Duration:  2 weeks Timing:  Intermittent Progression:  Worsening Relieved by:  None tried Worsened by:  Nothing tried Ineffective treatments:  None tried Associated symptoms: nausea   Associated symptoms: no chest pain, no chills, no cough, no diarrhea, no dysuria, no fever, no hematuria, no shortness of breath, no sore throat and no vomiting     HPI Comments: Yvette Gaines is a 44 y.o. female with a history of partial vaginal hysterectomy in May 2014 and cancer (ovarian, bladder), who presents to the Emergency Department complaining of intermittent, aching, 8/10 RLQ pain for the past 2 weeks that last night became constant, starting to radiate to her back and right thigh. She also complains of nausea, urinary frequency, urinary urgency, and sores around her mouth intermittently over the past 2 weeks. Patient reports passing a white, milky mucus plug vaginally that was unusual, especially since she has had no issues since her hysterectomy. She notes a history of mutiple UTI's in the past, typically following surgery. She denies fevers, chills, visual changes, cough, rhinorrhea, sore throat, chest pain, SOB, nausea, vomiting, diarrhea, dysuria, hematuria, leg swelling, bleeding easily, or headache.  PCP: [facility] in [place]   Past Medical History  Diagnosis Date  .  Cancer (HCC)     ovarian, bladder  . Manic depression ([place])   . Asthma    Past Surgical History  Procedure Laterality Date  . Abdominal hysterectomy    . Partial bladder removal     History reviewed. No pertinent family history. Social History  Substance Use Topics  . Smoking status: Current Every Day Smoker -- 0.50 packs/day    Types: Cigarettes  . Smokeless tobacco: None  . Alcohol Use: No   OB History    No data available     Review of Systems  Constitutional: Negative for fever and chills.  HENT: Negative for rhinorrhea and sore throat.   Eyes: Negative for visual disturbance.  Respiratory: Negative for cough and shortness of breath.   Cardiovascular: Negative for chest pain and leg swelling.  Gastrointestinal: Positive for nausea and abdominal pain (RLQ). Negative for vomiting and diarrhea.  Genitourinary: Positive for urgency and frequency. Negative for dysuria and hematuria.  Musculoskeletal: Positive for back pain (radiating from abdomen).  Skin: Positive for rash (sores around mouth, appeared intermittently over past 2 weeks).  Neurological: Negative for headaches.  Hematological: Does not bruise/bleed easily.    Allergies  Review of patient's allergies indicates no known allergies.  Home Medications   Prior to Admission medications   Medication Sig Start Date End Date Taking? Authorizing Provider  acetaminophen (TYLENOL) 500 MG tablet Take 1,000 mg by mouth every 6 (six) hours as needed for mild pain.   Yes Historical Provider, MD  albuterol (PROVENTIL HFA;VENTOLIN HFA) 108 (90 BASE) MCG/ACT inhaler Inhale 2 puffs into the lungs every  6 (six) hours as needed for wheezing or shortness of breath. 04/05/14  Yes Encarnacion Slates, NP  Aspirin-Salicylamide-Caffeine (BC HEADACHE POWDER PO) Take 1 packet by mouth 4 (four) times daily as needed (pain).   Yes Historical Provider, MD  diphenhydrAMINE (BENADRYL) 25 mg capsule Take 25 mg by mouth every 6 (six) hours as needed.    Yes Historical Provider, MD  HYDROcodone-acetaminophen (NORCO/VICODIN) 5-325 MG per tablet Take by mouth every 4 (four) hours as needed.    Yes Historical Provider, MD  acyclovir (ZOVIRAX) 800 MG tablet Take 1 tablet (800 mg total) by mouth 5 (five) times daily. Patient not taking: Reported on 06/28/2015 02/28/15   Nat Christen, MD  cephALEXin (KEFLEX) 500 MG capsule Take 1 capsule (500 mg total) by mouth 3 (three) times daily. Patient not taking: Reported on 06/28/2015 02/28/15   Nat Christen, MD  Fluticasone-Salmeterol (ADVAIR) 100-50 MCG/DOSE AEPB Inhale 1 puff into the lungs 2 (two) times daily. For asthma Patient not taking: Reported on 06/28/2015 04/05/14   Encarnacion Slates, NP  HYDROcodone-acetaminophen (NORCO/VICODIN) 5-325 MG tablet Take 1-2 tablets by mouth every 6 (six) hours as needed. 06/28/15   Fredia Sorrow, MD  ibuprofen (ADVIL,MOTRIN) 200 MG tablet Take 400 mg by mouth every 6 (six) hours as needed.    Historical Provider, MD  oxyCODONE-acetaminophen (PERCOCET/ROXICET) 5-325 MG per tablet Take 1-2 tablets by mouth every 6 (six) hours as needed. Patient not taking: Reported on 06/28/2015 02/28/15   Nat Christen, MD  promethazine (PHENERGAN) 25 MG tablet Take 1 tablet (25 mg total) by mouth every 6 (six) hours as needed. 06/28/15   Fredia Sorrow, MD   BP 153/96 mmHg  Pulse 67  Temp(Src) 98 F (36.7 C) (Oral)  Resp 14  Ht 5\' 2"  (1.575 m)  Wt 128 lb (58.06 kg)  BMI 23.41 kg/m2  SpO2 97% Physical Exam  Constitutional: She is oriented to person, place, and time. She appears well-developed and well-nourished. No distress.  HENT:  Head: Normocephalic and atraumatic.  Mouth/Throat: Oropharynx is clear and moist.  Mucous membranes are moist.   Eyes: Conjunctivae and EOM are normal. Pupils are equal, round, and reactive to light.  Sclera is clear. Pupils are normal. Eyes track normal.  Neck: Neck supple. No tracheal deviation present.  Cardiovascular: Normal rate, regular rhythm and  normal heart sounds.   No murmur heard. Pulmonary/Chest: Effort normal and breath sounds normal. No respiratory distress. She has no wheezes. She has no rales.  Lungs are clear to auscultation bilaterally.  Abdominal: Soft. Bowel sounds are normal. There is tenderness.  RLQ tenderness.  Musculoskeletal: Normal range of motion. She exhibits no edema.  No ankle swelling.  Neurological: She is alert and oriented to person, place, and time.  Skin: Skin is warm and dry.  Psychiatric: She has a normal mood and affect. Her behavior is normal.  Nursing note and vitals reviewed.   ED Course  Procedures (including critical care time)  DIAGNOSTIC STUDIES: Oxygen Saturation is 100% on RA, normal by my interpretation.    COORDINATION OF CARE: 12:42 PM - Discussed treatment plan with pt at bedside which includes diagnostic testing, including UA and CT abdomen. Pt verbalized understanding and agreed to plan.   Labs Review Labs Reviewed  COMPREHENSIVE METABOLIC PANEL - Abnormal; Notable for the following:    Calcium 8.8 (*)    ALT 11 (*)    All other components within normal limits  CBC WITH DIFFERENTIAL/PLATELET  URINALYSIS, ROUTINE W REFLEX MICROSCOPIC (NOT  AT [facility])  LIPASE, BLOOD   Results for orders placed or performed during the hospital encounter of 06/28/15  CBC WITH DIFFERENTIAL  Result Value Ref Range   WBC 7.7 4.0 - 10.5 K/uL   RBC 4.49 3.87 - 5.11 MIL/uL   Hemoglobin 15.0 12.0 - 15.0 g/dL   HCT 42.9 36.0 - 46.0 %   MCV 95.5 78.0 - 100.0 fL   MCH 33.4 26.0 - 34.0 pg   MCHC 35.0 30.0 - 36.0 g/dL   RDW 13.4 11.5 - 15.5 %   Platelets 274 150 - 400 K/uL   Neutrophils Relative % 70 %   Neutro Abs 5.4 1.7 - 7.7 K/uL   Lymphocytes Relative 25 %   Lymphs Abs 1.9 0.7 - 4.0 K/uL   Monocytes Relative 4 %   Monocytes Absolute 0.3 0.1 - 1.0 K/uL   Eosinophils Relative 1 %   Eosinophils Absolute 0.1 0.0 - 0.7 K/uL   Basophils Relative 0 %   Basophils Absolute 0.0 0.0 - 0.1 K/uL   Comprehensive metabolic panel  Result Value Ref Range   Sodium 138 135 - 145 mmol/L   Potassium 3.5 3.5 - 5.1 mmol/L   Chloride 108 101 - 111 mmol/L   CO2 22 22 - 32 mmol/L   Glucose, Bld 90 65 - 99 mg/dL   BUN 16 6 - 20 mg/dL   Creatinine, Ser 0.70 0.44 - 1.00 mg/dL   Calcium 8.8 (L) 8.9 - 10.3 mg/dL   Total Protein 6.9 6.5 - 8.1 g/dL   Albumin 4.1 3.5 - 5.0 g/dL   AST 17 15 - 41 U/L   ALT 11 (L) 14 - 54 U/L   Alkaline Phosphatase 51 38 - 126 U/L   Total Bilirubin 0.4 0.3 - 1.2 mg/dL   GFR calc non Af Amer >60 >60 mL/min   GFR calc Af Amer >60 >60 mL/min   Anion gap 8 5 - 15  Urinalysis, Routine w reflex microscopic (not at [facility])  Result Value Ref Range   Color, Urine YELLOW YELLOW   APPearance CLEAR CLEAR   Specific Gravity, Urine 1.015 1.005 - 1.030   pH 5.5 5.0 - 8.0   Glucose, UA NEGATIVE NEGATIVE mg/dL   Hgb urine dipstick NEGATIVE NEGATIVE   Bilirubin Urine NEGATIVE NEGATIVE   Ketones, ur NEGATIVE NEGATIVE mg/dL   Protein, ur NEGATIVE NEGATIVE mg/dL   Urobilinogen, UA 0.2 0.0 - 1.0 mg/dL   Nitrite NEGATIVE NEGATIVE   Leukocytes, UA NEGATIVE NEGATIVE  Lipase, blood  Result Value Ref Range   Lipase 35 22 - 51 U/L     Imaging Review Ct Abdomen Pelvis W Contrast  06/28/2015  CLINICAL DATA:  Abdominal pain on and off for 2 weeks EXAM: CT ABDOMEN AND PELVIS WITH CONTRAST TECHNIQUE: Multidetector CT imaging of the abdomen and pelvis was performed using the standard protocol following bolus administration of intravenous contrast. CONTRAST:  63mL OMNIPAQUE IOHEXOL 300 MG/ML SOLN, 129mL OMNIPAQUE IOHEXOL 300 MG/ML SOLN COMPARISON:  None. FINDINGS: Lower chest and abdominal wall:  No contributory findings. Hepatobiliary: No focal liver abnormality.No evidence of biliary obstruction or stone. Pancreas: Unremarkable. Spleen: Unremarkable. Adrenals/Urinary Tract: Negative adrenals. No hydronephrosis or stone. Unremarkable bladder. Reproductive:Hysterectomy with unremarkable  adnexa. Stomach/Bowel:  No obstruction. No appendicitis. Vascular/Lymphatic: No acute vascular abnormality. No mass or adenopathy. Peritoneal: Trace pelvic fluid which may be physiologic. Musculoskeletal: No acute abnormalities. IMPRESSION: Negative.  No explanation for abdominal pain. Electronically Signed   By: Monte Fantasia M.D.   On:  06/28/2015 13:10   I have personally reviewed and evaluated these images and lab results as part of my medical decision-making.  MDM   Final diagnoses:  Right lower quadrant abdominal pain  Essential hypertension    Workup for the right lower quadrant abdominal pain without any acute findings. No evidence of urinary tract infection despite the urinary symptoms. No adnexal abnormalities. Recommend follow-up of with GYN. Will treat the with pain medication and antinausea medicine.   Correction chart review has revealed evidence of opiate abuse problems in 2015. We'll not treat with hydrocodone. Will treat with Naprosyn.  I, Jeson Camacho, personally performed the services described in this documentation. All medical record entries made by the scribe were at my direction and in my presence.  I have reviewed the chart and discharge instructions and agree that the record reflects my personal performance and is accurate and complete. Anaise Sterbenz.  06/28/2015. 2:45 PM.       Fredia Sorrow, MD 06/28/15 1445  Fredia Sorrow, MD 06/28/15 1447

## 2015-07-04 ENCOUNTER — Ambulatory Visit (INDEPENDENT_AMBULATORY_CARE_PROVIDER_SITE_OTHER): Payer: BLUE CROSS/BLUE SHIELD | Admitting: Family Medicine

## 2015-07-04 ENCOUNTER — Encounter: Payer: Self-pay | Admitting: Family Medicine

## 2015-07-04 ENCOUNTER — Encounter: Payer: Self-pay | Admitting: Emergency Medicine

## 2015-07-04 ENCOUNTER — Emergency Department
Admission: EM | Admit: 2015-07-04 | Discharge: 2015-07-04 | Disposition: A | Discharge status: Home / Self Care | Payer: BLUE CROSS/BLUE SHIELD | Attending: Emergency Medicine | Admitting: Emergency Medicine

## 2015-07-04 ENCOUNTER — Other Ambulatory Visit: Payer: Self-pay

## 2015-07-04 VITALS — BP 160/100 | HR 67 | Temp 98.3°F | Ht 63.0 in | Wt 137.0 lb

## 2015-07-04 DIAGNOSIS — Z9151 Personal history of suicidal behavior: Secondary | ICD-10-CM

## 2015-07-04 DIAGNOSIS — R1031 Right lower quadrant pain: Secondary | ICD-10-CM

## 2015-07-04 DIAGNOSIS — Z720 Tobacco use: Secondary | ICD-10-CM | POA: Diagnosis not present

## 2015-07-04 DIAGNOSIS — N393 Stress incontinence (female) (male): Secondary | ICD-10-CM | POA: Diagnosis not present

## 2015-07-04 DIAGNOSIS — F1190 Opioid use, unspecified, uncomplicated: Secondary | ICD-10-CM

## 2015-07-04 DIAGNOSIS — M545 Low back pain: Secondary | ICD-10-CM | POA: Insufficient documentation

## 2015-07-04 DIAGNOSIS — Z791 Long term (current) use of non-steroidal anti-inflammatories (NSAID): Secondary | ICD-10-CM | POA: Diagnosis not present

## 2015-07-04 DIAGNOSIS — F39 Unspecified mood [affective] disorder: Secondary | ICD-10-CM

## 2015-07-04 DIAGNOSIS — F1120 Opioid dependence, uncomplicated: Secondary | ICD-10-CM

## 2015-07-04 DIAGNOSIS — F4310 Post-traumatic stress disorder, unspecified: Secondary | ICD-10-CM | POA: Diagnosis not present

## 2015-07-04 DIAGNOSIS — R32 Unspecified urinary incontinence: Secondary | ICD-10-CM

## 2015-07-04 DIAGNOSIS — J4530 Mild persistent asthma, uncomplicated: Secondary | ICD-10-CM

## 2015-07-04 DIAGNOSIS — Z9189 Other specified personal risk factors, not elsewhere classified: Secondary | ICD-10-CM

## 2015-07-04 DIAGNOSIS — Z79899 Other long term (current) drug therapy: Secondary | ICD-10-CM | POA: Diagnosis not present

## 2015-07-04 DIAGNOSIS — Z7951 Long term (current) use of inhaled steroids: Secondary | ICD-10-CM | POA: Diagnosis not present

## 2015-07-04 DIAGNOSIS — Z915 Personal history of self-harm: Secondary | ICD-10-CM

## 2015-07-04 DIAGNOSIS — Z792 Long term (current) use of antibiotics: Secondary | ICD-10-CM | POA: Diagnosis not present

## 2015-07-04 DIAGNOSIS — N3946 Mixed incontinence: Secondary | ICD-10-CM

## 2015-07-04 DIAGNOSIS — Z8541 Personal history of malignant neoplasm of cervix uteri: Secondary | ICD-10-CM

## 2015-07-04 LAB — URINALYSIS COMPLETE WITH MICROSCOPIC (ARMC ONLY)
Bilirubin Urine: NEGATIVE
Glucose, UA: NEGATIVE mg/dL
HGB URINE DIPSTICK: NEGATIVE
Ketones, ur: NEGATIVE mg/dL
Leukocytes, UA: NEGATIVE
Nitrite: NEGATIVE
PROTEIN: NEGATIVE mg/dL
SPECIFIC GRAVITY, URINE: 1.015 (ref 1.005–1.030)
pH: 5 (ref 5.0–8.0)

## 2015-07-04 MED ORDER — FLUTICASONE-SALMETEROL 100-50 MCG/DOSE IN AEPB: 1.0000 | INHALATION_SPRAY | Freq: Two times a day (BID) | RESPIRATORY_TRACT | Status: DC

## 2015-07-04 MED ORDER — KETOROLAC TROMETHAMINE 60 MG/2ML IM SOLN
60.0000 mg | Freq: Once | INTRAMUSCULAR | Status: AC
  Administered 2015-07-04: 60 mg via INTRAMUSCULAR
  Filled 2015-07-04: qty 2

## 2015-07-04 MED ORDER — TRAMADOL HCL 50 MG PO TABS: 50.0000 mg | ORAL_TABLET | Freq: Four times a day (QID) | ORAL | Status: DC | PRN

## 2015-07-04 NOTE — ED Notes (Signed)
MD at bedside for eval.

## 2015-07-04 NOTE — Patient Instructions (Addendum)
Patient is having back pain and new/severe incontinence.  I am concerned for Cauda Equina; patient need's stat MRI.  She can follow up with me in the next few weeks.  [place]

## 2015-07-04 NOTE — Progress Notes (Signed)
Subjective:  Patient ID: Yvette Gaines, female    DOB: 07/15/1971  Age: 44 y.o. MRN: [id number]  CC: RLQ pain, Incontinence  HPI Yvette Gaines is a 44 y.o. female presents to the clinic today as a new patient with the above complaints.   RLQ pain  Patient reports a two-week history of right lower quadrant pain.  She states the pain is severe and constant. It is described as an ache.  She states that it feels like she is on her period.  She reports associated nausea but denies vomiting.  No exacerbating or relieving factors.  She has been evaluated in the ED on 10/19 and had negative imaging/workup at that time.  Incontinence  Patient reports a prior history of stress incontinence that she developed after having children.  She reports that she's had severe incontinence for the past month. This is worsening.  She states that she is having incontinence all the time. She states that she is often unaware of having incontinence.  Additionally, she reports associated degenerative disc disease and chronic low back pain. She states that this seems to be stable but that she is in pain daily.  No exacerbating or relieving factors.  PMH, Surgical Hx, Family Hx, Social History reviewed and updated as below. Past Medical History  Diagnosis Date  . Cancer (HCC)     Cervical, ? 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 Manic depression ([place])   . Asthma   . Depression   . Chicken pox   . Arthritis   . Frequent headaches   . Urine incontinence   . Migraines    Past Surgical History  Procedure Laterality Date  . Partial bladder removal    . Cesarean section  1990  . Abdominal hysterectomy  2014    partial  . Laparoscopic removal abdominal mass Right 2002    inner right thigh   Family History  Problem Relation Age of Onset  . Cancer Brother     brain, lung, spine  . Cancer Maternal Aunt     bone and lung  . Cancer Paternal Grandfather     prostate  . Arthritis Paternal Grandfather   .  Hypertension Paternal Grandfather   . Arthritis Mother   . Mental illness Mother   . Arthritis Father   . Heart disease Father   . Hypertension Father   . Arthritis Maternal Grandmother   . Diabetes Maternal Grandmother   . Arthritis Maternal Grandfather   . Arthritis Paternal Grandmother    Social History  Substance Use Topics  . Smoking status: Current Every Day Smoker -- 0.50 packs/day    Types: Cigarettes  . Smokeless tobacco: Never Used  . Alcohol Use: No   Review of Systems  Eyes: Positive for visual disturbance.  Respiratory: Positive for shortness of breath.   Cardiovascular:       Swelling of the extremities.  Gastrointestinal: Positive for nausea.  Genitourinary: Positive for dysuria and frequency.       Incontinence. Vaginal discharge.  Skin:       Sores around mouth.  Neurological: Positive for dizziness, weakness, numbness and headaches.  Psychiatric/Behavioral: The patient is nervous/anxious.    All other systems negative.   Objective:   Today's Vitals: BP 160/100 mmHg  Pulse 67  Temp(Src) 98.3 F (36.8 C) (Oral)  Ht 5\' 3"  (1.6 m)  Wt 137 lb (62.143 kg)  BMI 24.27 kg/m2  SpO2 96%  Physical Exam  Constitutional:  Appears fatigued. NAD.   HENT:  Head: Normocephalic and atraumatic.  Nose: Nose normal.  Mouth/Throat: Oropharynx is clear and moist. No oropharyngeal exudate.  Normal TM's bilaterally.   Eyes: Conjunctivae are normal. No scleral icterus.  Neck: Neck supple.  Cardiovascular: Normal rate and regular rhythm.   No murmur heard. Pulmonary/Chest: Effort normal and breath sounds normal. She has no wheezes. She has no rales.  Abdominal: Soft. She exhibits no distension and no mass. There is no rebound and no guarding.  Mild RLQ tenderness.   Musculoskeletal:  Low back - paraspinal musculature with spasm. Decreased ROM and tenderness to palpation.   Lymphadenopathy:    She has no cervical adenopathy.  Neurological: She is alert.  Muscle  strength 4/5, Left lower extremity (decreased plantar flexion dorsiflexion, quad strength, and hip flexor strength). Pain sensation decreased on the left lower leg.   Skin: Skin is warm and dry. No rash noted.  Psychiatric:  Crying in the room. Depressed mood.  Vitals reviewed.  Assessment & Plan:   Problem List Items Addressed This Visit    Asthma    Advair, albuterol, and Singulair refilled today.      Relevant Medications   montelukast (SINGULAIR) 10 MG tablet   History of cervical cancer - Primary   History of suicide attempt   Low back pain   Mood disorder (HCC)   PTSD (post-traumatic stress disorder)   RLQ abdominal pain    Unclear etiology. Recent CT scan was unremarkable. Will likely need to send patient to GYN for evaluation given GYN history.       Severe opioid use disorder   Tobacco abuse   Urinary incontinence    Given low back pain and new persistent incontinence, I sent her to the ED for MRI of her lumbar spine as I was concerned for cauda equina. The ED physician did not think this was warranted and encouraged her to follow-up with PCP/OB/GYN.         Outpatient Encounter Prescriptions as of 07/04/2015  Medication Sig  . acetaminophen (TYLENOL) 500 MG tablet Take 1,000 mg by mouth every 6 (six) hours as needed for mild pain.  [name] albuterol (PROVENTIL HFA;VENTOLIN HFA) 108 (90 BASE) MCG/ACT inhaler Inhale 2 puffs into the lungs every 6 (six) hours as needed for wheezing or shortness of breath.  . [DISCONTINUED] Aspirin-Salicylamide-Caffeine (BC HEADACHE POWDER PO) Take 1 packet by mouth 4 (four) times daily as needed (pain).  . [DISCONTINUED] Fluticasone-Salmeterol (ADVAIR) 100-50 MCG/DOSE AEPB Inhale 1 puff into the lungs 2 (two) times daily. For asthma  . [DISCONTINUED] HYDROcodone-acetaminophen (NORCO/VICODIN) 5-325 MG per tablet Take by mouth every 4 (four) hours as needed.   . [DISCONTINUED] ibuprofen (ADVIL,MOTRIN) 200 MG tablet Take 400 mg by mouth  every 6 (six) hours as needed.  . [DISCONTINUED] promethazine (PHENERGAN) 25 MG tablet Take 1 tablet (25 mg total) by mouth every 6 (six) hours as needed.  . diphenhydrAMINE (BENADRYL) 25 mg capsule Take 25 mg by mouth every 6 (six) hours as needed.  . montelukast (SINGULAIR) 10 MG tablet Take 1 tablet (10 mg total) by mouth at bedtime.  . [DISCONTINUED] acyclovir (ZOVIRAX) 800 MG tablet Take 1 tablet (800 mg total) by mouth 5 (five) times daily. (Patient not taking: Reported on 06/28/2015)  . [DISCONTINUED] cephALEXin (KEFLEX) 500 MG capsule Take 1 capsule (500 mg total) by mouth 3 (three) times daily. (Patient not taking: Reported on 06/28/2015)  . [DISCONTINUED] HYDROcodone-acetaminophen (NORCO/VICODIN) 5-325 MG tablet Take 1-2 tablets by mouth every 6 (six) hours as needed.  . [  DISCONTINUED] naproxen (NAPROSYN) 500 MG tablet Take 1 tablet (500 mg total) by mouth 2 (two) times daily.  . [DISCONTINUED] oxyCODONE-acetaminophen (PERCOCET/ROXICET) 5-325 MG per tablet Take 1-2 tablets by mouth every 6 (six) hours as needed. (Patient not taking: Reported on 06/28/2015)   No facility-administered encounter medications on file as of 07/04/2015.    Follow-up: Following ED visit   [name] DO

## 2015-07-04 NOTE — Progress Notes (Signed)
Pre visit review using our clinic review tool, if applicable. No additional management support is needed unless otherwise documented below in the visit note. 

## 2015-07-04 NOTE — ED Notes (Signed)
Pt to ed with c/o incontinence, frequency of urine.  Pt states back pain x 1 week, but recently had episodes of incontinence.

## 2015-07-04 NOTE — Discharge Instructions (Signed)
Urinary Incontinence Urinary incontinence is the involuntary loss of urine from your bladder. CAUSES  There are many causes of urinary incontinence. They include:  Medicines.  Infections.  Prostatic enlargement, leading to overflow of urine from your bladder.  Surgery.  Neurological diseases.  Emotional factors. SIGNS AND SYMPTOMS Urinary Incontinence can be divided into four types: 1. Urge incontinence. Urge incontinence is the involuntary loss of urine before you have the opportunity to go to the bathroom. There is a sudden urge to void but not enough time to reach a bathroom. 2. Stress incontinence. Stress incontinence is the sudden loss of urine with any activity that forces urine to pass. It is commonly caused by anatomical changes to the pelvis and sphincter areas of your body. 3. Overflow incontinence. Overflow incontinence is the loss of urine from an obstructed opening to your bladder. This results in a backup of urine and a resultant buildup of pressure within the bladder. When the pressure within the bladder exceeds the closing pressure of the sphincter, the urine overflows, which causes incontinence, similar to water overflowing a dam. 4. Total incontinence. Total incontinence is the loss of urine as a result of the inability to store urine within your bladder. DIAGNOSIS  Evaluating the cause of incontinence may require:  A thorough and complete medical and obstetric history.  A complete physical exam.  Laboratory tests such as a urine culture and sensitivities. When additional tests are indicated, they can include:  An ultrasound exam.  Kidney and bladder X-rays.  Cystoscopy. This is an exam of the bladder using a narrow scope.  Urodynamic testing to test the nerve function to the bladder and sphincter areas. TREATMENT  Treatment for urinary incontinence depends on the cause:  For urge incontinence caused by a bacterial infection, antibiotics will be prescribed.  If the urge incontinence is related to medicines you take, your health care provider may have you change the medicine.  For stress incontinence, surgery to re-establish anatomical support to the bladder or sphincter, or both, will often correct the condition.  For overflow incontinence caused by an enlarged prostate, an operation to open the channel through the enlarged prostate will allow the flow of urine out of the bladder. In women with fibroids, a hysterectomy may be recommended.  For total incontinence, surgery on your urinary sphincter may help. An artificial urinary sphincter (an inflatable cuff placed around the urethra) may be required. In women who have developed a hole-like passage between their bladder and vagina (vesicovaginal fistula), surgery to close the fistula often is required. HOME CARE INSTRUCTIONS  Normal daily hygiene and the use of pads or adult diapers that are changed regularly will help prevent odors and skin damage.  Avoid caffeine. It can overstimulate your bladder.  Use the bathroom regularly. Try about every 2-3 hours to go to the bathroom, even if you do not feel the need to do so. Take time to empty your bladder completely. After urinating, wait a minute. Then try to urinate again.  For causes involving nerve dysfunction, keep a log of the medicines you take and a journal of the times you go to the bathroom. SEEK MEDICAL CARE IF:  You experience worsening of pain instead of improvement in pain after your procedure.  Your incontinence becomes worse instead of better. SEE IMMEDIATE MEDICAL CARE IF:  You experience fever or shaking chills.  You are unable to pass your urine.  You have redness spreading into your groin or down into your thighs. MAKE SURE   YOU:   Understand these instructions.   Will watch your condition.  Will get help right away if you are not doing well or get worse.   This information is not intended to replace advice given to you  by your health care provider. Make sure you discuss any questions you have with your health care provider.   Document Released: 10/03/2004 Document Revised: 09/16/2014 Document Reviewed: 02/02/2013 Elsevier Interactive Patient Education 2016 [employer]  

## 2015-07-04 NOTE — ED Provider Notes (Signed)
Time Seen: Approximately *1610 I have reviewed the triage notes  Chief Complaint: Back Pain   History of Present Illness: Caprisha Bridgett is a 44 y.o. female who presents after being referred here by local urgent care. The patient described to them some low back pain and some urinary incontinence. She states she's had the back pain for a long time intermittently and it's not any more intense than she has had before in the past. She is been ambulating normally, normal bowel movements, no radiation to this historian into the legs without any difficulty with numbness in the buttock area. He is had some intermittent difficulties with urination now for the last several months. She is previous partial hysterectomy. She states that she's had episodes where she had urinary incontinence associated with coughing and straining and now seems to have random urinary incontinence without abdominal pressure. She states that she feels like she needs to urinate and can't quite get all urine out at this time. She denies any true burning with urination or again any fever or flank pain.   Past Medical History  Diagnosis Date  . Cancer (HCC)     ovarian, bladder  . Manic depression ([place])   . Asthma   . Depression   . Chicken pox   . Arthritis   . Frequent headaches   . Allergy   . Urine incontinence   . Migraines   . UTI (urinary tract infection)     Patient Active Problem List   Diagnosis Date Noted  . History of cervical cancer 07/04/2015  . RLQ abdominal pain 07/04/2015  . Depression 04/03/2014  . Severe opioid use disorder 04/01/2014    Past Surgical History  Procedure Laterality Date  . Partial bladder removal    . Cesarean section  1990  . Abdominal hysterectomy  2014    partial  . Laparoscopic removal abdominal mass Right 2002    inner right thigh    Past Surgical History  Procedure Laterality Date  . Partial bladder removal    . Cesarean section  1990  . Abdominal hysterectomy  2014     partial  . Laparoscopic removal abdominal mass Right 2002    inner right thigh    Current Outpatient Rx  Name  Route  Sig  Dispense  Refill  . acetaminophen (TYLENOL) 500 MG tablet   Oral   Take 1,000 mg by mouth every 6 (six) hours as needed for mild pain.         [name] acyclovir (ZOVIRAX) 800 MG tablet   Oral   Take 1 tablet (800 mg total) by mouth 5 (five) times daily. Patient not taking: Reported on 06/28/2015   25 tablet   0   . albuterol (PROVENTIL HFA;VENTOLIN HFA) 108 (90 BASE) MCG/ACT inhaler   Inhalation   Inhale 2 puffs into the lungs every 6 (six) hours as needed for wheezing or shortness of breath.         . Aspirin-Salicylamide-Caffeine (BC HEADACHE POWDER PO)   Oral   Take 1 packet by mouth 4 (four) times daily as needed (pain).         . cephALEXin (KEFLEX) 500 MG capsule   Oral   Take 1 capsule (500 mg total) by mouth 3 (three) times daily. Patient not taking: Reported on 06/28/2015   15 capsule   0   . diphenhydrAMINE (BENADRYL) 25 mg capsule   Oral   Take 25 mg by mouth every 6 (six) hours as needed.         [name]  Fluticasone-Salmeterol (ADVAIR) 100-50 MCG/DOSE AEPB   Inhalation   Inhale 1 puff into the lungs 2 (two) times daily. For asthma   60 each   2   . ibuprofen (ADVIL,MOTRIN) 200 MG tablet   Oral   Take 400 mg by mouth every 6 (six) hours as needed.         . naproxen (NAPROSYN) 500 MG tablet   Oral   Take 1 tablet (500 mg total) by mouth 2 (two) times daily.   14 tablet   0   . promethazine (PHENERGAN) 25 MG tablet   Oral   Take 1 tablet (25 mg total) by mouth every 6 (six) hours as needed.   12 tablet   1   . traMADol (ULTRAM) 50 MG tablet   Oral   Take 1 tablet (50 mg total) by mouth every 6 (six) hours as needed.   20 tablet   0     Allergies:  Review of patient's allergies indicates no known allergies.  Family History: Family History  Problem Relation Age of Onset  . Cancer Brother     brain, lung, spine   . Cancer Maternal Aunt     bone and lung  . Cancer Paternal Grandfather     prostate  . Arthritis Paternal Grandfather   . Hypertension Paternal Grandfather   . Arthritis Mother   . Mental illness Mother   . Arthritis Father   . Heart disease Father   . Hypertension Father   . Arthritis Maternal Grandmother   . Diabetes Maternal Grandmother   . Arthritis Maternal Grandfather   . Arthritis Paternal Grandmother     Social History: Social History  Substance Use Topics  . Smoking status: Current Every Day Smoker -- 0.50 packs/day    Types: Cigarettes  . Smokeless tobacco: Never Used  . Alcohol Use: No     Review of Systems:   10 point review of systems was performed and was otherwise negative:  Constitutional: No fever Eyes: No visual disturbances ENT: No sore throat, ear pain Cardiac: No chest pain Respiratory: No shortness of breath, wheezing, or stridor Abdomen: No abdominal pain, no vomiting, No diarrhea Endocrine: No weight loss, No night sweats Extremities: No peripheral edema, cyanosis Skin: No rashes, easy bruising Neurologic: No focal weakness, trouble with speech or swollowing Urologic: Urinary frequency with incomplete voiding  Physical Exam:  ED Triage Vitals  Enc Vitals Group     BP 07/04/15 1706 162/99 mmHg     Pulse Rate 07/04/15 1706 70     Resp 07/04/15 1706 18     Temp 07/04/15 1706 97.8 F (36.6 C)     Temp Source 07/04/15 1706 Oral     SpO2 07/04/15 1706 100 %     Weight 07/04/15 1706 137 lb (62.143 kg)     Height 07/04/15 1706 5\' 2"  (1.575 m)     Head Cir --      Peak Flow --      Pain Score 07/04/15 1707 8     Pain Loc --      Pain Edu? --      Excl. in [place]? --     General: Awake , Alert , and Oriented times 3; GCS 15 Head: Normal cephalic , atraumatic Eyes: Pupils equal , round, reactive to light Nose/Throat: No nasal drainage, patent upper airway without erythema or exudate.  Neck: Supple, Full range of motion, No anterior  adenopathy or palpable thyroid masses Lungs: Clear to ascultation  without wheezes , rhonchi, or rales Heart: Regular rate, regular rhythm without murmurs , gallops , or rubs Abdomen: Soft, non tender without rebound, guarding , or rigidity; bowel sounds positive and symmetric in all 4 quadrants. No organomegaly .        Extremities: 2 plus symmetric pulses. No edema, clubbing or cyanosis Neurologic: normal ambulation, Motor symmetric without deficits, sensory intact and no sensory deficits across the buttock region she is able to stand on both her toes without difficulty or signs of weakness. Skin: warm, dry, no rashes   Labs:   All laboratory work was reviewed including any pertinent negatives or positives listed below:  Labs Reviewed  [place] ([place]) - Abnormal; Notable for the following:    Color, Urine YELLOW (*)    APPearance CLOUDY (*)    Bacteria, UA RARE (*)    Squamous Epithelial / LPF TOO NUMEROUS TO COUNT (*)    All other components within normal limits  URINE CULTURE   urine has a lot of squamous cells but otherwise no signs of infection   Procedures:  Patient had a bladder scan performed by the nursing staff which showed approximately 125 mL of retained urine    ED Course:  Patient's stay here was uneventful she was given a shot of Toradol for pain and had symptomatic improvement. The patient does not have any clinical findings indicative of cauda equina syndrome on exam. The patient was advised that she probably has some urinary incontinence due to her previous hysterectomy and I referred her to an OB/GYN her back pain seems to be a chronic intermittent issue and and advised her that I could order an MRI but clinically I didn't feel was indicated at this time. Patient appears to be of understanding and she was prescribed Ultram on an outpatient basis. She was advised she can always return here if she has any leg weakness, uncontrolled pain,  sensory issues or any other new concerns.    Assessment: Musculoskeletal back pain Bladder incontinence     Plan: Outpatient management Patient was advised to return immediately if condition worsens. Patient was advised to follow up with her primary care physician or other specialized physicians involved and in their current assessment.            Daymon Larsen, MD 07/04/15 [phone]

## 2015-07-04 NOTE — ED Notes (Signed)
Assumed pt care at this time. NAD noted. RR even and nonlabored. Will continue to monitor. 

## 2015-07-04 NOTE — ED Notes (Signed)
Pt trying to urinate at [facility].

## 2015-07-05 ENCOUNTER — Other Ambulatory Visit: Payer: Self-pay | Admitting: Family Medicine

## 2015-07-05 ENCOUNTER — Telehealth: Payer: Self-pay

## 2015-07-05 ENCOUNTER — Encounter: Payer: Self-pay | Admitting: Family Medicine

## 2015-07-05 DIAGNOSIS — M545 Low back pain, unspecified: Secondary | ICD-10-CM | POA: Insufficient documentation

## 2015-07-05 DIAGNOSIS — Z720 Tobacco use: Secondary | ICD-10-CM | POA: Insufficient documentation

## 2015-07-05 DIAGNOSIS — Z9151 Personal history of suicidal behavior: Secondary | ICD-10-CM | POA: Insufficient documentation

## 2015-07-05 DIAGNOSIS — J45909 Unspecified asthma, uncomplicated: Secondary | ICD-10-CM | POA: Insufficient documentation

## 2015-07-05 DIAGNOSIS — Z915 Personal history of self-harm: Secondary | ICD-10-CM | POA: Insufficient documentation

## 2015-07-05 DIAGNOSIS — F39 Unspecified mood [affective] disorder: Secondary | ICD-10-CM | POA: Insufficient documentation

## 2015-07-05 DIAGNOSIS — F4310 Post-traumatic stress disorder, unspecified: Secondary | ICD-10-CM | POA: Insufficient documentation

## 2015-07-05 MED ORDER — MONTELUKAST SODIUM 10 MG PO TABS: 10.0000 mg | ORAL_TABLET | Freq: Every day | ORAL | Status: DC

## 2015-07-05 MED ORDER — FLUTICASONE-SALMETEROL 100-50 MCG/DOSE IN AEPB: 1.0000 | INHALATION_SPRAY | Freq: Two times a day (BID) | RESPIRATORY_TRACT | Status: DC

## 2015-07-05 MED ORDER — ALBUTEROL SULFATE HFA 108 (90 BASE) MCG/ACT IN AERS: 2.0000 | INHALATION_SPRAY | Freq: Four times a day (QID) | RESPIRATORY_TRACT | Status: DC | PRN

## 2015-07-05 NOTE — Telephone Encounter (Signed)
Called to state she was told to [facility] and her pharmacy had not received the medication.

## 2015-07-05 NOTE — Assessment & Plan Note (Signed)
Given low back pain and new persistent incontinence, I sent her to the ED for MRI of her lumbar spine as I was concerned for cauda equina. The ED physician did not think this was warranted and encouraged her to follow-up with PCP/OB/GYN.

## 2015-07-05 NOTE — Assessment & Plan Note (Signed)
Advair, albuterol, and Singulair refilled today.

## 2015-07-05 NOTE — Telephone Encounter (Signed)
Was told that her rx should be at pharmacy. Needs more anti-depressants since she was unable to address that issue.

## 2015-07-05 NOTE — Assessment & Plan Note (Signed)
Unclear etiology. Recent CT scan was unremarkable. Will likely need to send patient to GYN for evaluation given GYN history.

## 2015-07-06 LAB — URINE CULTURE

## 2015-08-09 ENCOUNTER — Other Ambulatory Visit: Payer: Self-pay | Admitting: Family Medicine

## 2015-08-09 ENCOUNTER — Telehealth: Payer: Self-pay | Admitting: *Deleted

## 2015-08-09 MED ORDER — ALBUTEROL SULFATE HFA 108 (90 BASE) MCG/ACT IN AERS: 2.0000 | INHALATION_SPRAY | Freq: Four times a day (QID) | RESPIRATORY_TRACT | Status: AC | PRN

## 2015-08-09 MED ORDER — FLUTICASONE-SALMETEROL 100-50 MCG/DOSE IN AEPB: 1.0000 | INHALATION_SPRAY | Freq: Two times a day (BID) | RESPIRATORY_TRACT | Status: DC

## 2015-08-09 NOTE — Telephone Encounter (Signed)
Patient has requested Advair and Proventil be written in generic form. Patient requested Rx be sent back over in generic.

## 2015-08-09 NOTE — Telephone Encounter (Signed)
Rx sent 

## 2015-08-09 NOTE — Telephone Encounter (Signed)
Please advise 

## 2015-09-25 ENCOUNTER — Ambulatory Visit: Payer: Self-pay | Admitting: Family Medicine

## 2016-01-26 ENCOUNTER — Ambulatory Visit: Payer: Self-pay | Admitting: Family Medicine

## 2016-01-26 DIAGNOSIS — Z0289 Encounter for other administrative examinations: Secondary | ICD-10-CM

## 2016-02-23 NOTE — Telephone Encounter (Signed)
error 

## 2016-05-08 ENCOUNTER — Ambulatory Visit (INDEPENDENT_AMBULATORY_CARE_PROVIDER_SITE_OTHER): Payer: BLUE CROSS/BLUE SHIELD | Admitting: Family Medicine

## 2016-05-08 ENCOUNTER — Other Ambulatory Visit (HOSPITAL_COMMUNITY)
Admission: RE | Admit: 2016-05-08 | Discharge: 2016-05-08 | Disposition: A | Discharge status: Home / Self Care | Payer: BLUE CROSS/BLUE SHIELD | Source: Ambulatory Visit | Attending: Family Medicine | Admitting: Family Medicine

## 2016-05-08 ENCOUNTER — Encounter (INDEPENDENT_AMBULATORY_CARE_PROVIDER_SITE_OTHER): Payer: Self-pay

## 2016-05-08 ENCOUNTER — Encounter: Payer: Self-pay | Admitting: Family Medicine

## 2016-05-08 VITALS — BP 144/84 | HR 67 | Temp 98.0°F | Wt 133.0 lb

## 2016-05-08 DIAGNOSIS — F329 Major depressive disorder, single episode, unspecified: Secondary | ICD-10-CM

## 2016-05-08 DIAGNOSIS — N760 Acute vaginitis: Secondary | ICD-10-CM | POA: Insufficient documentation

## 2016-05-08 DIAGNOSIS — F418 Other specified anxiety disorders: Secondary | ICD-10-CM

## 2016-05-08 DIAGNOSIS — Z01419 Encounter for gynecological examination (general) (routine) without abnormal findings: Secondary | ICD-10-CM | POA: Diagnosis present

## 2016-05-08 DIAGNOSIS — Z124 Encounter for screening for malignant neoplasm of cervix: Secondary | ICD-10-CM | POA: Diagnosis not present

## 2016-05-08 DIAGNOSIS — J4530 Mild persistent asthma, uncomplicated: Secondary | ICD-10-CM

## 2016-05-08 DIAGNOSIS — Z8541 Personal history of malignant neoplasm of cervix uteri: Secondary | ICD-10-CM

## 2016-05-08 DIAGNOSIS — Z113 Encounter for screening for infections with a predominantly sexual mode of transmission: Secondary | ICD-10-CM | POA: Insufficient documentation

## 2016-05-08 DIAGNOSIS — F419 Anxiety disorder, unspecified: Principal | ICD-10-CM

## 2016-05-08 DIAGNOSIS — Z23 Encounter for immunization: Secondary | ICD-10-CM

## 2016-05-08 DIAGNOSIS — N898 Other specified noninflammatory disorders of vagina: Secondary | ICD-10-CM | POA: Diagnosis not present

## 2016-05-08 DIAGNOSIS — F32A Depression, unspecified: Secondary | ICD-10-CM

## 2016-05-08 MED ORDER — BUDESONIDE-FORMOTEROL FUMARATE 160-4.5 MCG/ACT IN AERO: 2.0000 | INHALATION_SPRAY | Freq: Two times a day (BID) | RESPIRATORY_TRACT | 3 refills | Status: DC

## 2016-05-08 MED ORDER — SERTRALINE HCL 50 MG PO TABS: 50.0000 mg | ORAL_TABLET | Freq: Every day | ORAL | 1 refills | Status: DC

## 2016-05-08 NOTE — Assessment & Plan Note (Signed)
Established problem, worsening/uncontrolled. Starting Symbicort.

## 2016-05-08 NOTE — Assessment & Plan Note (Signed)
New problem. Unclear etiology. Will test for yeast, BV, trich off of pap smear. Awaiting results.

## 2016-05-08 NOTE — Progress Notes (Signed)
Subjective:  Patient ID: Yvette Gaines, female    DOB: 02-28-1971  Age: 45 y.o. MRN: [id number]  CC: Anxiety, Asthma, Vaginal discharge  HPI:  45 year old female with a history of mood disorder, prior opiate abuse, PTSD, anxiety/depression, asthma, tobacco abuse presents with the above complaints.  Anxiety  Patient states that she's having increasing anxiety as of late.  She states that it has been worse after her dad got ill and was placed on life support.  She has been on medication in the past and would like to discuss starting medication to help with her anxiety.  Asthma  Uncontrolled; currently on albuterol only.   Patient states that she cannot afford Symbicort and would like to discuss switching today.  Vaginal discharge  Patient reports that for the past several months she's had intermittent periods of time where she's had white discharge.  No associated itching.  No recent sexual activity. Last time she was sexual activity was in February.  No associated burning or dysuria.  No known exacerbating or relieving factors. No interventions or medications tried.  No concerns of STDs at this time.   Hx of cervical cancer, s/p hysterectomy  In need of pap smear.  Will perform today.  Social Hx   Social History   Social History  . Marital status: Single    Spouse name: N/A  . Number of children: N/A  . Years of education: N/A   Social History Main Topics  . Smoking status: Current Every Day Smoker    Packs/day: 0.50    Types: Cigarettes  . Smokeless tobacco: Never Used  . Alcohol use No  . Drug use: No     Comment: Opoids- denies  . Sexual activity: Yes    Birth control/ protection: None   Other Topics Concern  . None   Social History Narrative  . None   Review of Systems  Respiratory: Positive for cough and shortness of breath.   Genitourinary: Positive for vaginal discharge.  Psychiatric/Behavioral:       Anxiety.  All other systems  reviewed and are negative.  Objective:  BP (!) 144/84 (BP Location: Right Arm, Patient Position: Sitting, Cuff Size: Normal)   Pulse 67   Temp 98 F (36.7 C) (Oral)   Wt 133 lb (60.3 kg)   SpO2 99%   BMI 24.33 kg/m   BP/Weight 05/08/2016 07/04/2015 [id number]  Systolic BP 123456 [id number]  Diastolic BP 84 88 [id number]  Wt. (Lbs) 133 137 137  BMI 24.33 25.05 24.27   Physical Exam  Constitutional: She is oriented to person, place, and time. She appears well-developed. No distress.  HENT:  Mouth/Throat: Oropharynx is clear and moist.  Eyes: Conjunctivae are normal. No scleral icterus.  Neck: Normal range of motion.  Cardiovascular: Normal rate and regular rhythm.   Pulmonary/Chest: Effort normal. She has no wheezes. She has no rales.  Abdominal: Soft. She exhibits no distension. There is no tenderness. There is no rebound and no guarding.  Genitourinary:  Genitourinary Comments: Pelvic Exam: External: normal female genitalia without lesions or masses Vagina: white discharge noted.  Cervix: Absent.  Pap smear: performed (vaginal cuff).   Musculoskeletal: Normal range of motion.  Neurological: She is alert and oriented to person, place, and time.  Skin: Skin is warm and dry.  Psychiatric:  Flat affect, depressed mood.  Vitals reviewed.  Lab Results  Component Value Date   WBC 7.7 06/28/2015   HGB 15.0 06/28/2015   HCT 42.9 06/28/2015  PLT 274 06/28/2015   GLUCOSE 90 06/28/2015   ALT 11 (L) 06/28/2015   AST 17 06/28/2015   NA 138 06/28/2015   K 3.5 06/28/2015   CL 108 06/28/2015   CREATININE 0.70 06/28/2015   BUN 16 06/28/2015   CO2 22 06/28/2015    Assessment & Plan:   Problem List Items Addressed This Visit    Anxiety and depression - Primary    Established problem, worsening. [place] admission reviewed. Despite history of "manic depression", patient was discharged on Zoloft. We will restart today. Rx sent.      History of cervical cancer    Pap smear performed  today.      Asthma    Established problem, worsening/uncontrolled. Starting Symbicort.      Relevant Medications   budesonide-formoterol (SYMBICORT) 160-4.5 MCG/ACT inhaler   Vaginal discharge    New problem. Unclear etiology. Will test for yeast, BV, trich off of pap smear. Awaiting results.       Other Visit Diagnoses    Encounter for immunization       Relevant Orders   Flu Vaccine QUAD 36+ mos IM (Completed)   Pap smear for cervical cancer screening       Relevant Orders   Cytology - PAP      Meds ordered this encounter  Medications  . sertraline (ZOLOFT) 50 MG tablet    Sig: Take 1 tablet (50 mg total) by mouth daily.    Dispense:  90 tablet    Refill:  1  . budesonide-formoterol (SYMBICORT) 160-4.5 MCG/ACT inhaler    Sig: Inhale 2 puffs into the lungs 2 (two) times daily.    Dispense:  1 Inhaler    Refill:  3    Follow-up: BP check in 2 weeks.  [place]

## 2016-05-08 NOTE — Assessment & Plan Note (Signed)
Pap smear performed today. 

## 2016-05-08 NOTE — Assessment & Plan Note (Signed)
Established problem, worsening. [place] admission reviewed. Despite history of "manic depression", patient was discharged on Zoloft. We will restart today. Rx sent.

## 2016-05-08 NOTE — Progress Notes (Signed)
Pre visit review using our clinic review tool, if applicable. No additional management support is needed unless otherwise documented below in the visit note. 

## 2016-05-08 NOTE — Patient Instructions (Signed)
Take the zoloft as prescribed.  I sent in another inhaler.  We will call with the results of your pap smear.  Follow up in 1-3 months  Take care  Dr. Lacinda Axon

## 2016-05-09 LAB — CYTOLOGY - PAP

## 2016-05-10 ENCOUNTER — Telehealth: Payer: Self-pay | Admitting: Family Medicine

## 2016-05-10 NOTE — Telephone Encounter (Signed)
Spoke with patient, reviewed labs, see result note. thanks

## 2016-05-10 NOTE — Telephone Encounter (Signed)
Pt called returning your call regarding lab results.   Call pt @ [phone]. Thank you!

## 2016-05-12 LAB — CERVICOVAGINAL ANCILLARY ONLY: CANDIDA VAGINITIS: NEGATIVE

## 2016-05-14 ENCOUNTER — Other Ambulatory Visit: Payer: Self-pay | Admitting: Family Medicine

## 2016-05-14 MED ORDER — METRONIDAZOLE 500 MG PO TABS: 500.0000 mg | ORAL_TABLET | Freq: Two times a day (BID) | ORAL | 0 refills | Status: DC

## 2016-05-27 ENCOUNTER — Telehealth: Payer: Self-pay | Admitting: *Deleted

## 2016-05-27 NOTE — Telephone Encounter (Signed)
Spoke with the patient, reviewed labs, thanks

## 2016-05-27 NOTE — Telephone Encounter (Signed)
Pt requested lab results  Pt contact [phone]

## 2016-06-12 ENCOUNTER — Ambulatory Visit: Payer: Self-pay | Admitting: Family Medicine

## 2021-06-29 ENCOUNTER — Emergency Department: Payer: Self-pay

## 2021-06-29 ENCOUNTER — Encounter (HOSPITAL_COMMUNITY): Payer: Self-pay | Admitting: Emergency Medicine

## 2021-06-29 ENCOUNTER — Inpatient Hospital Stay (HOSPITAL_COMMUNITY)
Admission: EM | Admit: 2021-06-29 | Discharge: 2021-08-19 | DRG: 288 | Disposition: A | Discharge status: Home / Self Care | Payer: 59 | Attending: Internal Medicine | Admitting: Internal Medicine

## 2021-06-29 ENCOUNTER — Emergency Department (HOSPITAL_COMMUNITY): Payer: 59

## 2021-06-29 DIAGNOSIS — Z20822 Contact with and (suspected) exposure to covid-19: Secondary | ICD-10-CM | POA: Diagnosis present

## 2021-06-29 DIAGNOSIS — M1990 Unspecified osteoarthritis, unspecified site: Secondary | ICD-10-CM | POA: Diagnosis present

## 2021-06-29 DIAGNOSIS — R0781 Pleurodynia: Secondary | ICD-10-CM | POA: Diagnosis not present

## 2021-06-29 DIAGNOSIS — L02414 Cutaneous abscess of left upper limb: Secondary | ICD-10-CM | POA: Diagnosis present

## 2021-06-29 DIAGNOSIS — F1110 Opioid abuse, uncomplicated: Secondary | ICD-10-CM | POA: Diagnosis not present

## 2021-06-29 DIAGNOSIS — I330 Acute and subacute infective endocarditis: Secondary | ICD-10-CM | POA: Diagnosis present

## 2021-06-29 DIAGNOSIS — I361 Nonrheumatic tricuspid (valve) insufficiency: Secondary | ICD-10-CM | POA: Diagnosis not present

## 2021-06-29 DIAGNOSIS — I6340 Cerebral infarction due to embolism of unspecified cerebral artery: Secondary | ICD-10-CM | POA: Insufficient documentation

## 2021-06-29 DIAGNOSIS — L02416 Cutaneous abscess of left lower limb: Secondary | ICD-10-CM | POA: Diagnosis present

## 2021-06-29 DIAGNOSIS — Z681 Body mass index (BMI) 19 or less, adult: Secondary | ICD-10-CM | POA: Diagnosis not present

## 2021-06-29 DIAGNOSIS — E43 Unspecified severe protein-calorie malnutrition: Secondary | ICD-10-CM | POA: Diagnosis present

## 2021-06-29 DIAGNOSIS — R32 Unspecified urinary incontinence: Secondary | ICD-10-CM | POA: Diagnosis present

## 2021-06-29 DIAGNOSIS — E785 Hyperlipidemia, unspecified: Secondary | ICD-10-CM | POA: Diagnosis present

## 2021-06-29 DIAGNOSIS — K121 Other forms of stomatitis: Secondary | ICD-10-CM | POA: Diagnosis present

## 2021-06-29 DIAGNOSIS — I76 Septic arterial embolism: Secondary | ICD-10-CM | POA: Diagnosis present

## 2021-06-29 DIAGNOSIS — J4520 Mild intermittent asthma, uncomplicated: Secondary | ICD-10-CM | POA: Diagnosis present

## 2021-06-29 DIAGNOSIS — Z8614 Personal history of Methicillin resistant Staphylococcus aureus infection: Secondary | ICD-10-CM

## 2021-06-29 DIAGNOSIS — G43909 Migraine, unspecified, not intractable, without status migrainosus: Secondary | ICD-10-CM | POA: Diagnosis present

## 2021-06-29 DIAGNOSIS — D696 Thrombocytopenia, unspecified: Secondary | ICD-10-CM | POA: Diagnosis present

## 2021-06-29 DIAGNOSIS — E871 Hypo-osmolality and hyponatremia: Secondary | ICD-10-CM | POA: Diagnosis present

## 2021-06-29 DIAGNOSIS — M4642 Discitis, unspecified, cervical region: Secondary | ICD-10-CM | POA: Diagnosis not present

## 2021-06-29 DIAGNOSIS — I619 Nontraumatic intracerebral hemorrhage, unspecified: Secondary | ICD-10-CM | POA: Insufficient documentation

## 2021-06-29 DIAGNOSIS — G4700 Insomnia, unspecified: Secondary | ICD-10-CM | POA: Diagnosis present

## 2021-06-29 DIAGNOSIS — R21 Rash and other nonspecific skin eruption: Secondary | ICD-10-CM | POA: Diagnosis present

## 2021-06-29 DIAGNOSIS — I6330 Cerebral infarction due to thrombosis of unspecified cerebral artery: Secondary | ICD-10-CM | POA: Diagnosis not present

## 2021-06-29 DIAGNOSIS — Z86711 Personal history of pulmonary embolism: Secondary | ICD-10-CM

## 2021-06-29 DIAGNOSIS — I38 Endocarditis, valve unspecified: Secondary | ICD-10-CM | POA: Diagnosis not present

## 2021-06-29 DIAGNOSIS — E876 Hypokalemia: Secondary | ICD-10-CM | POA: Diagnosis present

## 2021-06-29 DIAGNOSIS — R1310 Dysphagia, unspecified: Secondary | ICD-10-CM | POA: Diagnosis not present

## 2021-06-29 DIAGNOSIS — M4802 Spinal stenosis, cervical region: Secondary | ICD-10-CM | POA: Diagnosis present

## 2021-06-29 DIAGNOSIS — I6310 Cerebral infarction due to embolism of unspecified precerebral artery: Secondary | ICD-10-CM | POA: Diagnosis not present

## 2021-06-29 DIAGNOSIS — L02415 Cutaneous abscess of right lower limb: Secondary | ICD-10-CM | POA: Diagnosis present

## 2021-06-29 DIAGNOSIS — R52 Pain, unspecified: Secondary | ICD-10-CM | POA: Diagnosis present

## 2021-06-29 DIAGNOSIS — F1990 Other psychoactive substance use, unspecified, uncomplicated: Secondary | ICD-10-CM | POA: Diagnosis not present

## 2021-06-29 DIAGNOSIS — I611 Nontraumatic intracerebral hemorrhage in hemisphere, cortical: Secondary | ICD-10-CM | POA: Diagnosis not present

## 2021-06-29 DIAGNOSIS — Z90710 Acquired absence of both cervix and uterus: Secondary | ICD-10-CM

## 2021-06-29 DIAGNOSIS — E8809 Other disorders of plasma-protein metabolism, not elsewhere classified: Secondary | ICD-10-CM | POA: Diagnosis present

## 2021-06-29 DIAGNOSIS — R7881 Bacteremia: Secondary | ICD-10-CM | POA: Diagnosis present

## 2021-06-29 DIAGNOSIS — Z8261 Family history of arthritis: Secondary | ICD-10-CM

## 2021-06-29 DIAGNOSIS — B182 Chronic viral hepatitis C: Secondary | ICD-10-CM | POA: Diagnosis present

## 2021-06-29 DIAGNOSIS — R079 Chest pain, unspecified: Secondary | ICD-10-CM

## 2021-06-29 DIAGNOSIS — Z818 Family history of other mental and behavioral disorders: Secondary | ICD-10-CM

## 2021-06-29 DIAGNOSIS — I071 Rheumatic tricuspid insufficiency: Secondary | ICD-10-CM | POA: Diagnosis present

## 2021-06-29 DIAGNOSIS — T826XXA Infection and inflammatory reaction due to cardiac valve prosthesis, initial encounter: Secondary | ICD-10-CM | POA: Diagnosis not present

## 2021-06-29 DIAGNOSIS — L02419 Cutaneous abscess of limb, unspecified: Secondary | ICD-10-CM

## 2021-06-29 DIAGNOSIS — Z8619 Personal history of other infectious and parasitic diseases: Secondary | ICD-10-CM

## 2021-06-29 DIAGNOSIS — I339 Acute and subacute endocarditis, unspecified: Secondary | ICD-10-CM | POA: Diagnosis not present

## 2021-06-29 DIAGNOSIS — F339 Major depressive disorder, recurrent, unspecified: Secondary | ICD-10-CM | POA: Diagnosis present

## 2021-06-29 DIAGNOSIS — I2690 Septic pulmonary embolism without acute cor pulmonale: Secondary | ICD-10-CM | POA: Diagnosis present

## 2021-06-29 DIAGNOSIS — F17210 Nicotine dependence, cigarettes, uncomplicated: Secondary | ICD-10-CM | POA: Diagnosis present

## 2021-06-29 DIAGNOSIS — M009 Pyogenic arthritis, unspecified: Secondary | ICD-10-CM | POA: Diagnosis present

## 2021-06-29 DIAGNOSIS — F1120 Opioid dependence, uncomplicated: Secondary | ICD-10-CM | POA: Diagnosis present

## 2021-06-29 DIAGNOSIS — H53462 Homonymous bilateral field defects, left side: Secondary | ICD-10-CM | POA: Diagnosis present

## 2021-06-29 DIAGNOSIS — F419 Anxiety disorder, unspecified: Secondary | ICD-10-CM | POA: Diagnosis present

## 2021-06-29 DIAGNOSIS — D509 Iron deficiency anemia, unspecified: Secondary | ICD-10-CM | POA: Diagnosis present

## 2021-06-29 DIAGNOSIS — Z8249 Family history of ischemic heart disease and other diseases of the circulatory system: Secondary | ICD-10-CM

## 2021-06-29 DIAGNOSIS — B9562 Methicillin resistant Staphylococcus aureus infection as the cause of diseases classified elsewhere: Secondary | ICD-10-CM | POA: Diagnosis present

## 2021-06-29 DIAGNOSIS — I079 Rheumatic tricuspid valve disease, unspecified: Secondary | ICD-10-CM | POA: Diagnosis not present

## 2021-06-29 DIAGNOSIS — F17200 Nicotine dependence, unspecified, uncomplicated: Secondary | ICD-10-CM | POA: Diagnosis not present

## 2021-06-29 DIAGNOSIS — I609 Nontraumatic subarachnoid hemorrhage, unspecified: Secondary | ICD-10-CM | POA: Diagnosis present

## 2021-06-29 DIAGNOSIS — S0633AA Contusion and laceration of cerebrum, unspecified, with loss of consciousness status unknown, initial encounter: Secondary | ICD-10-CM | POA: Diagnosis not present

## 2021-06-29 DIAGNOSIS — M4712 Other spondylosis with myelopathy, cervical region: Secondary | ICD-10-CM | POA: Diagnosis present

## 2021-06-29 DIAGNOSIS — D649 Anemia, unspecified: Secondary | ICD-10-CM | POA: Diagnosis not present

## 2021-06-29 DIAGNOSIS — M4652 Other infective spondylopathies, cervical region: Secondary | ICD-10-CM | POA: Diagnosis present

## 2021-06-29 LAB — LACTIC ACID, PLASMA
Lactic Acid, Venous: 1.1 mmol/L (ref 0.5–1.9)
Lactic Acid, Venous: 1.6 mmol/L (ref 0.5–1.9)

## 2021-06-29 LAB — BASIC METABOLIC PANEL
Anion gap: 11 (ref 5–15)
BUN: 13 mg/dL (ref 6–20)
CO2: 17 mmol/L — ABNORMAL LOW (ref 22–32)
Calcium: 7.7 mg/dL — ABNORMAL LOW (ref 8.9–10.3)
Chloride: 103 mmol/L (ref 98–111)
Creatinine, Ser: 0.61 mg/dL (ref 0.44–1.00)
GFR, Estimated: >60 mL/min (ref >60)
Glucose, Bld: 98 mg/dL (ref 70–99)
Potassium: 3.7 mmol/L (ref 3.5–5.1)
Sodium: 131 mmol/L — ABNORMAL LOW (ref 135–145)

## 2021-06-29 LAB — TROPONIN I (HIGH SENSITIVITY)
Troponin I (High Sensitivity): 8 ng/L (ref <18)
Troponin I (High Sensitivity): 9 ng/L (ref <18)

## 2021-06-29 LAB — CBC
HCT: 27.4 % — ABNORMAL LOW (ref 36.0–46.0)
Hemoglobin: 8.5 g/dL — ABNORMAL LOW (ref 12.0–15.0)
MCH: 28 pg (ref 26.0–34.0)
MCHC: 31 g/dL (ref 30.0–36.0)
MCV: 90.1 fL (ref 80.0–100.0)
Platelets: 336 10*3/uL (ref 150–400)
RBC: 3.04 MIL/uL — ABNORMAL LOW (ref 3.87–5.11)
RDW: 18.2 % — ABNORMAL HIGH (ref 11.5–15.5)
WBC: 16.9 10*3/uL — ABNORMAL HIGH (ref 4.0–10.5)
nRBC: 0 % (ref 0.0–0.2)

## 2021-06-29 LAB — RESP PANEL BY RT-PCR (FLU A&B, COVID) ARPGX2
Influenza A by PCR: NEGATIVE
Influenza B by PCR: NEGATIVE
SARS Coronavirus 2 by RT PCR: NEGATIVE

## 2021-06-29 LAB — HEPATIC FUNCTION PANEL
ALT: 50 U/L — ABNORMAL HIGH (ref 0–44)
AST: 40 U/L (ref 15–41)
Albumin: 1.8 g/dL — ABNORMAL LOW (ref 3.5–5.0)
Alkaline Phosphatase: 167 U/L — ABNORMAL HIGH (ref 38–126)
Bilirubin, Direct: 0.5 mg/dL — ABNORMAL HIGH (ref 0.0–0.2)
Indirect Bilirubin: 0.7 mg/dL (ref 0.3–0.9)
Total Bilirubin: 1.2 mg/dL (ref 0.3–1.2)
Total Protein: 6 g/dL — ABNORMAL LOW (ref 6.5–8.1)

## 2021-06-29 LAB — BRAIN NATRIURETIC PEPTIDE: B Natriuretic Peptide: 113.8 pg/mL — ABNORMAL HIGH (ref 0.0–100.0)

## 2021-06-29 LAB — C-REACTIVE PROTEIN: CRP: 17.6 mg/dL — ABNORMAL HIGH (ref <1.0)

## 2021-06-29 LAB — HIV ANTIBODY (ROUTINE TESTING W REFLEX): HIV Screen 4th Generation wRfx: NONREACTIVE

## 2021-06-29 LAB — PREALBUMIN: Prealbumin: 8.7 mg/dL — ABNORMAL LOW (ref 18–38)

## 2021-06-29 IMAGING — DX DG CHEST 1V
1 series · 1 of 1 positions shown · non-contrast
Comparison: [DATE].  Chest CT [DATE]

CLINICAL DATA: Chest pain, short of breath.  Endocarditis

EXAM:
CHEST  1 VIEW

[w chest pa]
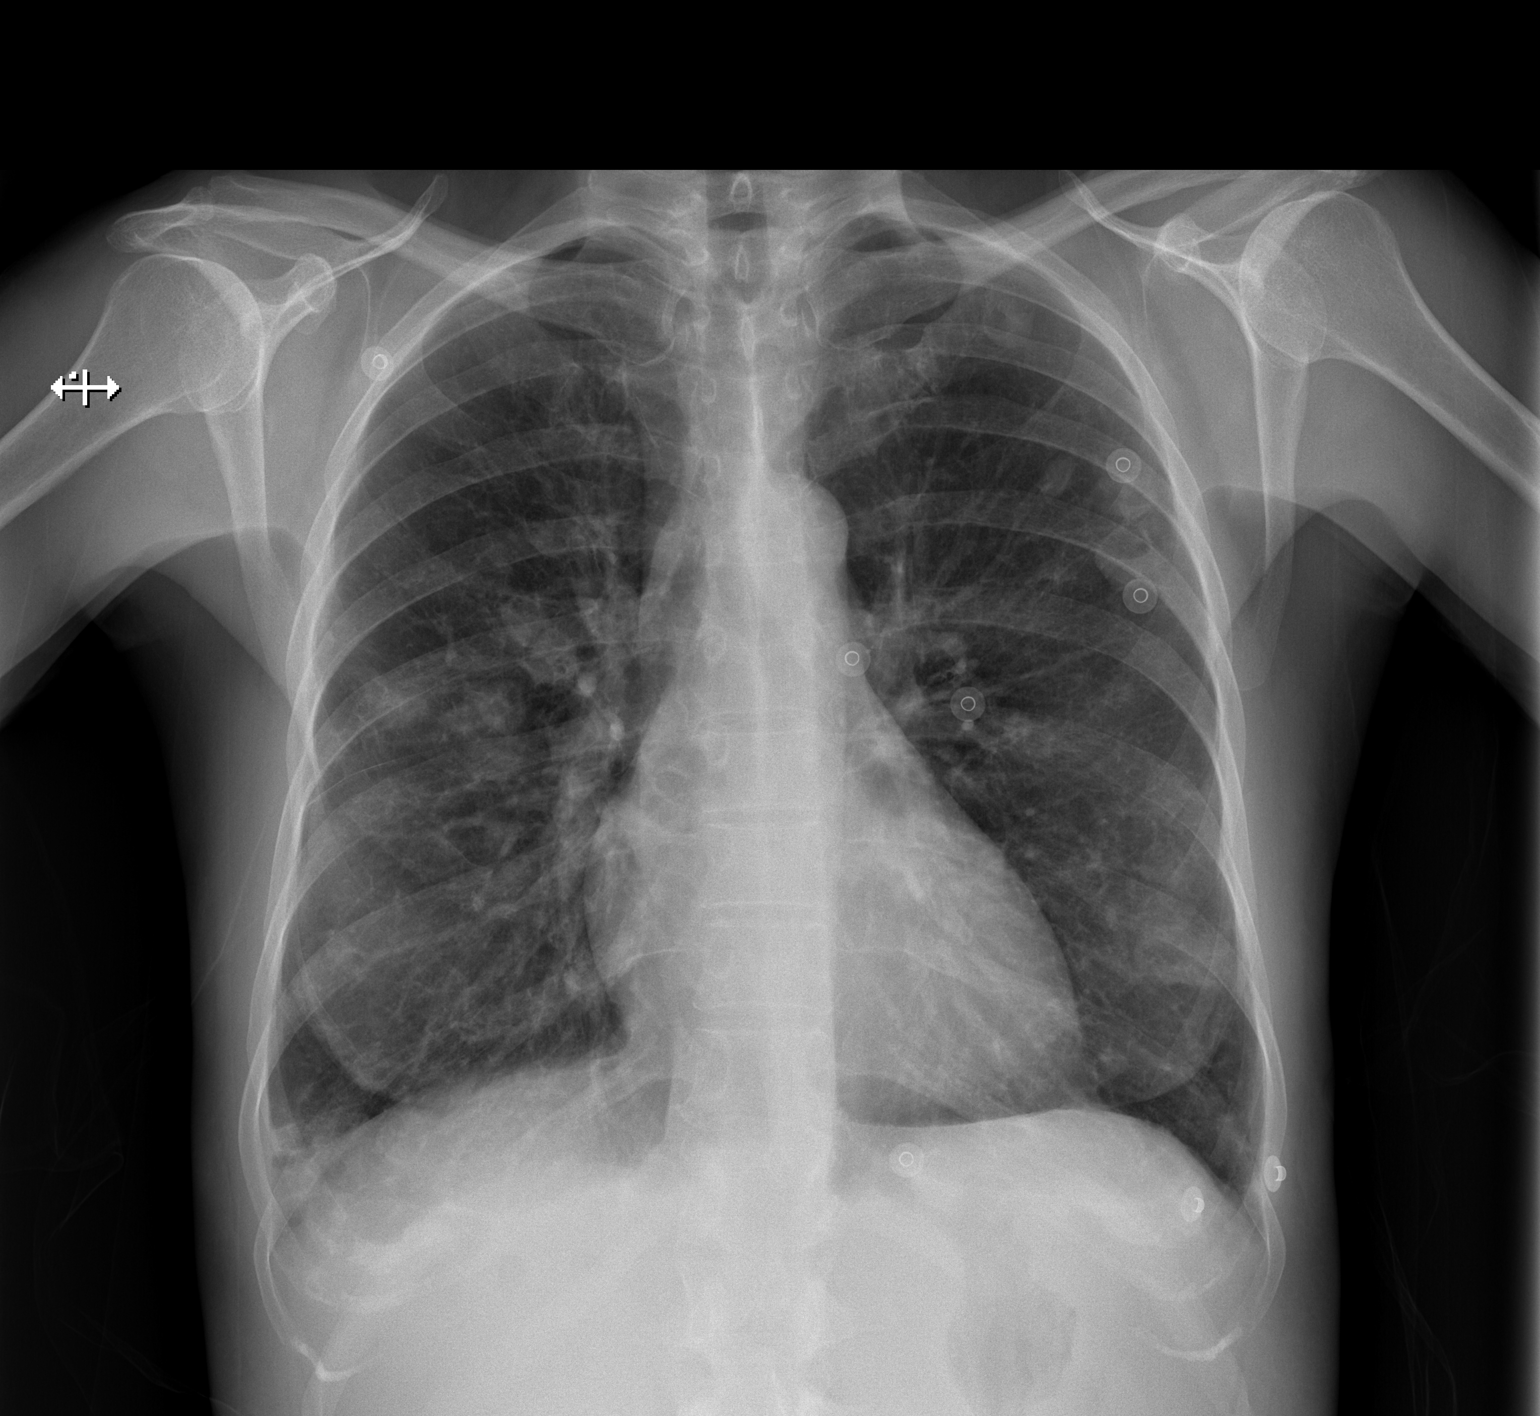

[1 of 1 positions shown; findings below may reference images not displayed]

FINDINGS: Numerous bilateral pulmonary nodules some of which are cavitated.
These are best seen on the chest CT.

Heart size and vascularity normal. Minimal right pleural effusion.
No left effusion. No lobar infiltrate.
IMPRESSION: Multiple pulmonary nodules with cavitation. Findings are most
suggestive of septic emboli. Small right effusion.

## 2021-06-29 MED ORDER — VANCOMYCIN HCL 1250 MG/250ML IV SOLN
1250.0000 mg | Freq: Once | INTRAVENOUS | Status: DC
  Filled 2021-06-29: qty 250

## 2021-06-29 MED ORDER — LORAZEPAM 1 MG PO TABS
1.0000 mg | ORAL_TABLET | ORAL | Status: DC | PRN
  Administered 2021-06-29 – 2021-07-01 (×3): 1 mg via ORAL
  Administered 2021-07-01 – 2021-07-02 (×2): 2 mg via ORAL
  Filled 2021-06-29 (×4): qty 1
  Filled 2021-06-29: qty 2
  Filled 2021-06-29 (×2): qty 1

## 2021-06-29 MED ORDER — LOPERAMIDE HCL 2 MG PO CAPS: 2.0000 mg | ORAL_CAPSULE | ORAL | Status: AC | PRN

## 2021-06-29 MED ORDER — ONDANSETRON 4 MG PO TBDP: 4.0000 mg | ORAL_TABLET | Freq: Four times a day (QID) | ORAL | Status: DC | PRN

## 2021-06-29 MED ORDER — RIFAMPIN 300 MG PO CAPS: 300.0000 mg | ORAL_CAPSULE | Freq: Every day | ORAL | Status: DC

## 2021-06-29 MED ORDER — ACETAMINOPHEN 325 MG PO TABS
650.0000 mg | ORAL_TABLET | Freq: Four times a day (QID) | ORAL | Status: DC | PRN
  Administered 2021-06-30 – 2021-08-08 (×50): 650 mg via ORAL
  Filled 2021-06-29 (×54): qty 2

## 2021-06-29 MED ORDER — BUPRENORPHINE HCL-NALOXONE HCL 2-0.5 MG SL SUBL
1.0000 | SUBLINGUAL_TABLET | Freq: Every day | SUBLINGUAL | Status: AC
  Administered 2021-06-29: 1 via SUBLINGUAL
  Filled 2021-06-29: qty 1

## 2021-06-29 MED ORDER — CLONIDINE HCL 0.1 MG PO TABS
0.1000 mg | ORAL_TABLET | Freq: Every day | ORAL | Status: AC
  Administered 2021-07-04 – 2021-07-05 (×2): 0.1 mg via ORAL
  Filled 2021-06-29 (×2): qty 1

## 2021-06-29 MED ORDER — ONDANSETRON HCL 4 MG/2ML IJ SOLN: 4.0000 mg | Freq: Four times a day (QID) | INTRAMUSCULAR | Status: DC | PRN

## 2021-06-29 MED ORDER — ACETAMINOPHEN 650 MG RE SUPP
650.0000 mg | Freq: Four times a day (QID) | RECTAL | Status: DC | PRN
  Filled 2021-06-29: qty 1

## 2021-06-29 MED ORDER — VANCOMYCIN HCL 750 MG/150ML IV SOLN: 750.0000 mg | Freq: Two times a day (BID) | INTRAVENOUS | Status: DC

## 2021-06-29 MED ORDER — CLONIDINE HCL 0.1 MG PO TABS
0.1000 mg | ORAL_TABLET | ORAL | Status: AC
  Administered 2021-07-01 – 2021-07-03 (×3): 0.1 mg via ORAL
  Filled 2021-06-29 (×4): qty 1

## 2021-06-29 MED ORDER — KETOROLAC TROMETHAMINE 30 MG/ML IJ SOLN
30.0000 mg | Freq: Four times a day (QID) | INTRAMUSCULAR | Status: AC | PRN
  Administered 2021-06-29 – 2021-07-04 (×11): 30 mg via INTRAVENOUS
  Filled 2021-06-29 (×12): qty 1

## 2021-06-29 MED ORDER — HYDROXYZINE HCL 25 MG PO TABS
25.0000 mg | ORAL_TABLET | Freq: Four times a day (QID) | ORAL | Status: AC | PRN
  Administered 2021-06-29 – 2021-07-03 (×5): 25 mg via ORAL
  Filled 2021-06-29 (×6): qty 1

## 2021-06-29 MED ORDER — VANCOMYCIN HCL 750 MG/150ML IV SOLN
750.0000 mg | Freq: Two times a day (BID) | INTRAVENOUS | Status: DC
  Administered 2021-06-29 – 2021-07-01 (×4): 750 mg via INTRAVENOUS
  Filled 2021-06-29 (×6): qty 150

## 2021-06-29 MED ORDER — ALBUTEROL SULFATE (2.5 MG/3ML) 0.083% IN NEBU: 2.5000 mg | INHALATION_SOLUTION | Freq: Four times a day (QID) | RESPIRATORY_TRACT | Status: DC | PRN

## 2021-06-29 MED ORDER — ENSURE ENLIVE PO LIQD
237.0000 mL | Freq: Two times a day (BID) | ORAL | Status: DC
  Administered 2021-06-29 – 2021-07-04 (×9): 237 mL via ORAL
  Filled 2021-06-29 (×5): qty 237

## 2021-06-29 MED ORDER — NICOTINE 21 MG/24HR TD PT24
21.0000 mg | MEDICATED_PATCH | Freq: Every day | TRANSDERMAL | Status: DC
  Administered 2021-06-29 – 2021-08-13 (×38): 21 mg via TRANSDERMAL
  Filled 2021-06-29 (×50): qty 1

## 2021-06-29 MED ORDER — METHOCARBAMOL 500 MG PO TABS
500.0000 mg | ORAL_TABLET | Freq: Three times a day (TID) | ORAL | Status: AC | PRN
  Administered 2021-07-02 – 2021-07-03 (×2): 500 mg via ORAL
  Filled 2021-06-29 (×3): qty 1

## 2021-06-29 MED ORDER — CLONIDINE HCL 0.1 MG PO TABS
0.1000 mg | ORAL_TABLET | Freq: Four times a day (QID) | ORAL | Status: AC
  Administered 2021-06-29 – 2021-07-01 (×6): 0.1 mg via ORAL
  Filled 2021-06-29 (×7): qty 1

## 2021-06-29 MED ORDER — SODIUM CHLORIDE 0.9 % IV SOLN: 2.0000 g | Freq: Once | INTRAVENOUS | Status: DC

## 2021-06-29 MED ORDER — LORAZEPAM 2 MG/ML IJ SOLN
1.0000 mg | INTRAMUSCULAR | Status: DC | PRN
Start: 2021-06-29 — End: 2021-07-02

## 2021-06-29 MED ORDER — ENOXAPARIN SODIUM 40 MG/0.4ML IJ SOSY
40.0000 mg | PREFILLED_SYRINGE | INTRAMUSCULAR | Status: DC
  Administered 2021-06-29: 40 mg via SUBCUTANEOUS
  Filled 2021-06-29: qty 0.4

## 2021-06-29 MED ORDER — ONDANSETRON HCL 4 MG PO TABS: 4.0000 mg | ORAL_TABLET | Freq: Four times a day (QID) | ORAL | Status: DC | PRN

## 2021-06-29 MED ORDER — BUPRENORPHINE HCL-NALOXONE HCL 8-2 MG SL SUBL
1.0000 | SUBLINGUAL_TABLET | Freq: Every day | SUBLINGUAL | Status: DC
  Administered 2021-06-30 – 2021-07-01 (×2): 1 via SUBLINGUAL
  Filled 2021-06-29 (×2): qty 1

## 2021-06-29 MED ORDER — DICYCLOMINE HCL 20 MG PO TABS
20.0000 mg | ORAL_TABLET | Freq: Four times a day (QID) | ORAL | Status: AC | PRN
  Filled 2021-06-29: qty 1

## 2021-06-29 MED ORDER — NAPROXEN 250 MG PO TABS: 500.0000 mg | ORAL_TABLET | Freq: Two times a day (BID) | ORAL | Status: DC | PRN

## 2021-06-29 NOTE — ED Provider Notes (Signed)
[facility] EMERGENCY DEPARTMENT Provider Note   CSN: [id number] Arrival date & time: 06/29/21  0932     History Chief Complaint  Patient presents with   Chest Pain   Weakness   Shortness of Breath   Drug Problem    Yvette Gaines is a 50 y.o. female.  50 year old female presents today after she has been admitted to [facility] and needing for 3 days including ICU level care being treated for PE, endocarditis and on IV antibiotics via PICC line.  She reports outside hospital was working on transport to higher level of care since yesterday, and given that the have not been successful finding transport until now she decided to leave and present here.  Currently she endorses generalized pain and swelling.  She denies fever, chills, dyspnea, chest pain.  She reports couple weeks ago she presented to the emergency room for left lower extremity abscess and left shoulder abscess which were both drained.  She was sent home with Keflex. Returned several days later with rash and swelling when she was admitted and treated for PE and endocarditis for 3 days prior to presenting here. Endorses IV heroin use. Last use 4 days ago.   The history is provided by the patient. No language interpreter was used.      Past Medical History:  Diagnosis Date   Arthritis    Asthma    Cancer ([place])    Cervical, ? 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   Chicken pox    Depression    Frequent headaches    Manic depression ([place])    Migraines    Urine incontinence     Patient Active Problem List   Diagnosis Date Noted   Vaginal discharge 05/08/2016   Mood disorder ([place]) 07/05/2015   PTSD (post-traumatic stress disorder) 07/05/2015   History of suicide attempt 07/05/2015   Tobacco abuse 07/05/2015   Asthma 07/05/2015   History of cervical cancer 07/04/2015   Anxiety and depression 04/03/2014   Severe opioid use disorder ([place]) 04/01/2014    Past Surgical History:  Procedure Laterality Date    ABDOMINAL HYSTERECTOMY  2014   partial   CESAREAN SECTION  1990   LAPAROSCOPIC REMOVAL ABDOMINAL MASS Right 2002   inner right thigh   partial bladder removal       OB History   No obstetric history on file.     Family History  Problem Relation Age of Onset   Cancer Brother        brain, lung, spine   Cancer Maternal Aunt        bone and lung   Cancer Paternal Grandfather        prostate   Arthritis Paternal Grandfather    Hypertension Paternal Grandfather    Arthritis Mother    Mental illness Mother    Arthritis Father    Heart disease Father    Hypertension Father    Arthritis Maternal Grandmother    Diabetes Maternal Grandmother    Arthritis Maternal Grandfather    Arthritis Paternal Grandmother     Social History   Tobacco Use   Smoking status: Every Day    Packs/day: 1.00    Types: Cigarettes   Smokeless tobacco: Never  Substance Use Topics   Alcohol use: No    Alcohol/week: 0.0 - 1.0 standard drinks   Drug use: Yes    Comment: Heroin    Home Medications Prior to Admission medications   Medication Sig Start Date End Date Taking?  Authorizing Provider  acetaminophen (TYLENOL) 500 MG tablet Take 1,000 mg by mouth every 6 (six) hours as needed for mild pain.    [provider]  albuterol (PROVENTIL HFA;VENTOLIN HFA) 108 (90 BASE) MCG/ACT inhaler Inhale 2 puffs into the lungs every 6 (six) hours as needed for wheezing or shortness of breath. Patient not taking: Reported on 05/08/2016 08/09/15   [name], DO  budesonide-formoterol [facility]) 160-4.5 MCG/ACT inhaler Inhale 2 puffs into the lungs 2 (two) times daily. 05/08/16   [name], DO  diphenhydrAMINE (BENADRYL) 25 mg capsule Take 25 mg by mouth every 6 (six) hours as needed.    [provider]  metroNIDAZOLE (FLAGYL) 500 MG tablet Take 1 tablet (500 mg total) by mouth 2 (two) times daily. 05/14/16   [name], DO  sertraline (ZOLOFT) 50 MG tablet Take 1 tablet (50 mg total) by  mouth daily. 05/08/16   [name], DO    Allergies    Patient has no known allergies.  Review of Systems   Review of Systems  All other systems reviewed and are negative.  Physical Exam Updated Vital Signs BP 115/80 (BP Location: Right Arm)   Pulse 66   Temp 97.9 F (36.6 C) (Oral)   Resp 12   SpO2 100%   Physical Exam Vitals and nursing note reviewed.  Constitutional:      General: She is not in acute distress.    Appearance: She is not ill-appearing.  HENT:     Head: Normocephalic and atraumatic.     Nose: Nose normal.  Eyes:     General: No scleral icterus.    Extraocular Movements: Extraocular movements intact.     Conjunctiva/sclera: Conjunctivae normal.  Cardiovascular:     Rate and Rhythm: Regular rhythm.     Pulses: Normal pulses.     Heart sounds: Normal heart sounds.  Pulmonary:     Effort: Pulmonary effort is normal. No respiratory distress.     Breath sounds: Normal breath sounds. No wheezing or rales.  Abdominal:     General: There is no distension.     Tenderness: There is no abdominal tenderness.  Musculoskeletal:        General: Normal range of motion.     Cervical back: Normal range of motion.     Right lower leg: Edema (1 + pitting edema) present.     Left lower leg: No edema.  Skin:    General: Skin is warm and dry.     Findings: Rash (LLE purpuric rash) present.  Neurological:     General: No focal deficit present.     Mental Status: She is alert. Mental status is at baseline.    ED Results / Procedures / Treatments   Labs (all labs ordered are listed, but only abnormal results are displayed) Labs Reviewed  CULTURE, BLOOD (ROUTINE X 2)  CULTURE, BLOOD (ROUTINE X 2)  CULTURE, BLOOD (SINGLE)  RESP PANEL BY RT-PCR (FLU A&B, COVID) ARPGX2  BASIC METABOLIC PANEL  CBC  RAPID URINE DRUG SCREEN, HOSP PERFORMED  LACTIC ACID, PLASMA  LACTIC ACID, PLASMA  BRAIN NATRIURETIC PEPTIDE  HEPATIC FUNCTION PANEL  TROPONIN I (HIGH SENSITIVITY)     EKG EKG Interpretation  Date/Time:  Friday June 29 2021 09:18:55 EDT Ventricular Rate:  77 PR Interval:  108 QRS Duration: 96 QT Interval:  396 QTC Calculation: 448 R Axis:   68 Text Interpretation: Sinus rhythm with short PR Otherwise normal ECG Confirmed by [place],  Adam [phone]) on 06/29/2021 10:39:40 AM  Radiology DG Chest 1 View  Result Date: 06/29/2021 CLINICAL DATA:  Chest pain, short of breath.  Endocarditis EXAM: CHEST  1 VIEW COMPARISON:  06/28/2021.  Chest CT 06/24/2021 FINDINGS: Numerous bilateral pulmonary nodules some of which are cavitated. These are best seen on the chest CT. Heart size and vascularity normal. Minimal right pleural effusion. No left effusion. No lobar infiltrate. IMPRESSION: Multiple pulmonary nodules with cavitation. Findings are most suggestive of septic emboli. Small right effusion. Electronically Signed   By: Franchot Gallo M.D.   On: 06/29/2021 10:26    Procedures Procedures   Medications Ordered in ED Medications - No data to display  ED Course  I have reviewed the triage vital signs and the nursing notes.  Pertinent labs & imaging results that were available during my care of the patient were reviewed by me and considered in my medical decision making (see chart for details).    MDM Rules/Calculators/A&P                           50 year old female presents from [facility] in a private vehicle for higher level of care because she did not want to wait in the emergency room waiting for care.  She is currently resting in room without acute distress or complaint.  Will she had PICC line receiving IV antibiotics.  She was told she had a PE but does not recall being on anticoagulation. CBC with leukocytosis and anemia. CXR with multiple lesions consistent with septic emboli. Blood cultures x3 ordered. Other blood work pending. Records requested from OSH. CTA chest ordered to evaluate for PE. Picc line order placed. Anticipate admission.    I personally reviewed chart received from OSH.  CTA chest from outside hospital without evidence of PE but multiple bilateral cavitary pulmonary nodules concerning for septic emboli vs infectious process present.  Echo with preserved EF and moderate sized vegetations noted on the tricuspid valve along with tricuspid regurgitation.  Blood cultures positive for staph aureus treated with vancomycin.  She did have PICC line at OSH which was removed prior t to patient leaving AMA.  Patient is currently stable with stable vital signs. Patient understands she needs admission and that she needs to be compliant with this admission given that she has left multiple times in a previous admission for drug use given her withdrawal symptoms.  She was agreeable to admission.  Case discussed with hospitalist per Dr. Ronnald Nian who will evaluate patient for admission.  Final Clinical Impression(s) / ED Diagnoses Final diagnoses:  Chest pain    Rx / DC Orders ED Discharge Orders     None        Evlyn Courier, PA-C 06/29/21 [place], [place], DO 06/29/21 1447

## 2021-06-29 NOTE — ED Notes (Signed)
Attending provider at bedside

## 2021-06-29 NOTE — ED Triage Notes (Signed)
Pt. Stated, I was at [facility] and they were trying to transfer me to [facility]. I have endocarditis , SOB and just weak and swelling all over.

## 2021-06-29 NOTE — Consult Note (Addendum)
[facility]    Date of Admission:  06/29/2021   Total days of antibiotics: 0 vancomycin               Reason for Consult: MRSA IE    Referring Provider: Zing   Assessment: MRSA IE Prosthetic TV Septic pulm emboli Heroin abuse Septic arthritis L shoulder Rash (vasculitis from IE?) Oral ulcer  Plan: Resume vanco Cvts eval Would not place PIC until more stable Repeat BCx being sent Check HIV, RPR, hepatitis Start suboxone Ortho, sugery eval for sutures oput, joints?  Thank you so much for this interesting consult,  Active Problems:   Endocarditis    cloNIDine  0.1 mg Oral QID   Followed by   Derrill Memo ON 07/01/2021] cloNIDine  0.1 mg Oral BH-qamhs   Followed by   Derrill Memo ON 07/04/2021] cloNIDine  0.1 mg Oral QAC breakfast   nicotine  21 mg Transdermal Daily    HPI: Yvette Gaines is a 50 y.o. female recently adm (06-24-21) to [facility] with MRSA prosthetic tricuspid valve  endocarditis. She was also found to have septic pulmonary emboli.  Pt states she was to be transferred to another facility for care but she got tired of waiting and left there AMA this am and had a friend bring her here.   She is an active heroin user- last use 4 day ago. She has been in rehab and has a center in [place] she can follow with.  She has been on suboxxone and would like to be on again.   Review of Systems: Review of Systems  Constitutional:  Negative for chills and fever.  Respiratory:  Positive for cough and shortness of breath.   Cardiovascular:  Positive for chest pain (pleuritic).  Gastrointestinal:  Negative for constipation and diarrhea.  Genitourinary:  Negative for dysuria.   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   Chicken pox    Depression    Frequent headaches    Manic depression (HCC)    Migraines    Urine incontinence     Social History    Tobacco Use   Smoking status: Every Day    Packs/day: 1.00    Types: Cigarettes   Smokeless tobacco: Never  Substance Use Topics   Alcohol use: No    Alcohol/week: 0.0 - 1.0 standard drinks   Drug use: Yes    Comment: Heroin    Family History  Problem Relation Age of Onset   Cancer Brother        brain, lung, spine   Cancer Maternal Aunt        bone and lung   Cancer Paternal Grandfather        prostate   Arthritis Paternal Grandfather    Hypertension Paternal Grandfather    Arthritis Mother    Mental illness Mother    Arthritis Father    Heart disease Father    Hypertension Father    Arthritis Maternal Grandmother    Diabetes Maternal Grandmother    Arthritis Maternal Grandfather    Arthritis Paternal Grandmother      Medications: Scheduled:  cloNIDine  0.1 mg Oral QID   Followed by   Derrill Memo ON 07/01/2021] cloNIDine  0.1 mg Oral BH-qamhs   Followed by   Derrill Memo ON 07/04/2021] cloNIDine  0.1 mg Oral QAC breakfast  feeding supplement  237 mL Oral BID BM   nicotine  21 mg Transdermal Daily    Abtx:  Anti-infectives (From admission, onward)    Start     Dose/Rate Route Frequency Ordered Stop   06/30/21 0200  vancomycin (VANCOREADY) IVPB 750 mg/150 mL  Status:  Discontinued        750 mg 150 mL/hr over 60 Minutes Intravenous Every 12 hours 06/29/21 1253 06/29/21 1254   06/29/21 1700  vancomycin (VANCOREADY) IVPB 750 mg/150 mL        750 mg 150 mL/hr over 60 Minutes Intravenous Every 12 hours 06/29/21 1308     06/29/21 1130  vancomycin (VANCOREADY) IVPB 1250 mg/250 mL  Status:  Discontinued        1,250 mg 166.7 mL/hr over 90 Minutes Intravenous  Once 06/29/21 1124 06/29/21 1254   06/29/21 1130  ceFEPIme (MAXIPIME) 2 g in sodium chloride 0.9 % 100 mL IVPB  Status:  Discontinued        2 g 200 mL/hr over 30 Minutes Intravenous  Once 06/29/21 1124 06/29/21 1251         OBJECTIVE: Blood pressure 119/85, pulse 66, temperature 97.9 F (36.6 C),  temperature source Oral, resp. rate 18, height 5\' 2"  (1.575 m), weight 60.3 kg, SpO2 100 %.  Physical Exam Vitals reviewed.  Constitutional:      General: She is not in acute distress.    Appearance: She is ill-appearing. She is not toxic-appearing.  HENT:     Mouth/Throat:     Mouth: Mucous membranes are moist.     Pharynx: No oropharyngeal exudate.     Comments: Ulcer, erosion on posterior L pharynx Eyes:     Extraocular Movements: Extraocular movements intact.     Pupils: Pupils are equal, round, and reactive to light.  Cardiovascular:     Rate and Rhythm: Normal rate and regular rhythm.  Pulmonary:     Effort: Pulmonary effort is normal.     Breath sounds: Rhonchi present.  Abdominal:     General: Bowel sounds are normal. There is no distension.     Palpations: Abdomen is soft.     Tenderness: There is no abdominal tenderness.  Musculoskeletal:     Cervical back: Normal range of motion and neck supple.     Comments: Well healed wound on L shoulder. Stitches still present Well healed wound on L calf, stitches still present.   Skin:    Findings: Rash (diffuse vasulitis, non-raised, non-macular) present.  Neurological:     Mental Status: She is alert.    Lab Results Results for orders placed or performed during the hospital encounter of 06/29/21 (from the past 48 hour(s))  Basic metabolic panel     Status: Abnormal   Collection Time: 06/29/21  9:24 AM  Result Value Ref Range   Sodium 131 (L) 135 - 145 mmol/L   Potassium 3.7 3.5 - 5.1 mmol/L   Chloride 103 98 - 111 mmol/L   CO2 17 (L) 22 - 32 mmol/L   Glucose, Bld 98 70 - 99 mg/dL    Comment: Glucose reference range applies only to samples taken after fasting for at least 8 hours.   BUN 13 6 - 20 mg/dL   Creatinine, Ser 0.61 0.44 - 1.00 mg/dL   Calcium 7.7 (L) 8.9 - 10.3 mg/dL   GFR, Estimated >60 >60 mL/min    Comment: (NOTE) Calculated using the CKD-EPI Creatinine Equation (2021)    Anion gap 11 5 - 15  Comment: Performed at [facility], [place] [address], [place], [place] [address]  CBC     Status: Abnormal   Collection Time: 06/29/21  9:24 AM  Result Value Ref Range   WBC 16.9 (H) 4.0 - 10.5 K/uL   RBC 3.04 (L) 3.87 - 5.11 MIL/uL   Hemoglobin 8.5 (L) 12.0 - 15.0 g/dL   HCT 27.4 (L) 36.0 - 46.0 %   MCV 90.1 80.0 - 100.0 fL   MCH 28.0 26.0 - 34.0 pg   MCHC 31.0 30.0 - 36.0 g/dL   RDW 18.2 (H) 11.5 - 15.5 %   Platelets 336 150 - 400 K/uL   nRBC 0.0 0.0 - 0.2 %    Comment: Performed at [facility], [place] [address], [place], [place] [address]  Troponin I (High Sensitivity)     Status: None   Collection Time: 06/29/21  9:24 AM  Result Value Ref Range   Troponin I (High Sensitivity) 8 <18 ng/L    Comment: (NOTE) Elevated high sensitivity troponin I (hsTnI) values and significant  changes across serial measurements may suggest ACS but many other  chronic and acute conditions are known to elevate hsTnI results.  Refer to the "Links" section for chest pain algorithms and additional  guidance. Performed at [facility], [place] [address] [place], [place] [address]   Hepatic function panel     Status: Abnormal   Collection Time: 06/29/21  9:24 AM  Result Value Ref Range   Total Protein 6.0 (L) 6.5 - 8.1 g/dL   Albumin 1.8 (L) 3.5 - 5.0 g/dL   AST 40 15 - 41 U/L   ALT 50 (H) 0 - 44 U/L   Alkaline Phosphatase 167 (H) 38 - 126 U/L   Total Bilirubin 1.2 0.3 - 1.2 mg/dL   Bilirubin, Direct 0.5 (H) 0.0 - 0.2 mg/dL   Indirect Bilirubin 0.7 0.3 - 0.9 mg/dL    Comment: Performed at [facility] [address], [place], [place] [address]  Resp Panel by RT-PCR (Flu A&B, Covid) Nasopharyngeal Swab     Status: None   Collection Time: 06/29/21  9:55 AM   Specimen: Nasopharyngeal Swab; Nasopharyngeal(NP) swabs in vial transport medium  Result Value Ref Range   SARS Coronavirus 2 by RT PCR NEGATIVE NEGATIVE    Comment: (NOTE) SARS-CoV-2 target nucleic acids are NOT  DETECTED.  The SARS-CoV-2 RNA is generally detectable in upper respiratory specimens during the acute phase of infection. The lowest concentration of SARS-CoV-2 viral copies this assay can detect is 138 copies/mL. A negative result does not preclude SARS-Cov-2 infection and should not be used as the sole basis for treatment or other patient management decisions. A negative result may occur with  improper specimen collection/handling, submission of specimen other than nasopharyngeal swab, presence of viral mutation(s) within the areas targeted by this assay, and inadequate number of viral copies(<138 copies/mL). A negative result must be combined with clinical observations, patient history, and epidemiological information. The expected result is Negative.  Fact Sheet for Patients:  EntrepreneurPulse.com.au  Fact Sheet for Healthcare Providers:  IncredibleEmployment.be  This test is no t yet approved or cleared by the [place] FDA and  has been authorized for detection and/or diagnosis of SARS-CoV-2 by FDA under an Emergency Use Authorization (EUA). This EUA will remain  in effect (meaning this test can be used) for the duration of the COVID-19 declaration under Section 564(b)(1) of the Act, 21 U.S.C.section 360bbb-3(b)(1), unless the  authorization is terminated  or revoked sooner.       Influenza A by PCR NEGATIVE NEGATIVE   Influenza B by PCR NEGATIVE NEGATIVE    Comment: (NOTE) The Xpert Xpress SARS-CoV-2/FLU/RSV plus assay is intended as an aid in the diagnosis of influenza from Nasopharyngeal swab specimens and should not be used as a sole basis for treatment. Nasal washings and aspirates are unacceptable for Xpert Xpress SARS-CoV-2/FLU/RSV testing.  Fact Sheet for Patients: EntrepreneurPulse.com.au  Fact Sheet for Healthcare Providers: IncredibleEmployment.be  This test is not yet approved or  cleared by the [place] FDA and has been authorized for detection and/or diagnosis of SARS-CoV-2 by FDA under an Emergency Use Authorization (EUA). This EUA will remain in effect (meaning this test can be used) for the duration of the COVID-19 declaration under Section 564(b)(1) of the Act, 21 U.S.C. section 360bbb-3(b)(1), unless the authorization is terminated or revoked.  Performed at [facility], [place] [address] [place], [place] [address]   Troponin I (High Sensitivity)     Status: None   Collection Time: 06/29/21 11:20 AM  Result Value Ref Range   Troponin I (High Sensitivity) 9 <18 ng/L    Comment: (NOTE) Elevated high sensitivity troponin I (hsTnI) values and significant  changes across serial measurements may suggest ACS but many other  chronic and acute conditions are known to elevate hsTnI results.  Refer to the "Links" section for chest pain algorithms and additional  guidance. Performed at [facility], [place] [address], [place], [place] [address]   Lactic acid, plasma     Status: None   Collection Time: 06/29/21 11:23 AM  Result Value Ref Range   Lactic Acid, Venous 1.1 0.5 - 1.9 mmol/L    Comment: Performed at [facility] [address], [place], [place] [address]      Component Value Date/Time   SDES URINE, CLEAN CATCH 07/04/2015 1709   SPECREQUEST NONE 07/04/2015 1709   CULT INSIGNIFICANT GROWTH 07/04/2015 1709   REPTSTATUS 07/06/2015 FINAL 07/04/2015 1709   DG Chest 1 View  Result Date: 06/29/2021 CLINICAL DATA:  Chest pain, short of breath.  Endocarditis EXAM: CHEST  1 VIEW COMPARISON:  06/28/2021.  Chest CT 06/24/2021 FINDINGS: Numerous bilateral pulmonary nodules some of which are cavitated. These are best seen on the chest CT. Heart size and vascularity normal. Minimal right pleural effusion. No left effusion. No lobar infiltrate. IMPRESSION: Multiple pulmonary nodules with cavitation. Findings are most suggestive of septic emboli.  Small right effusion. Electronically Signed   By: Franchot Gallo M.D.   On: 06/29/2021 10:26   [place] EKG SITE RITE  Result Date: 06/29/2021 If Site Rite image not attached, placement could not be confirmed due to current cardiac rhythm.  Recent Results (from the past 240 hour(s))  Resp Panel by RT-PCR (Flu A&B, Covid) Nasopharyngeal Swab     Status: None   Collection Time: 06/29/21  9:55 AM   Specimen: Nasopharyngeal Swab; Nasopharyngeal(NP) swabs in vial transport medium  Result Value Ref Range Status   SARS Coronavirus 2 by RT PCR NEGATIVE NEGATIVE Final    Comment: (NOTE) SARS-CoV-2 target nucleic acids are NOT DETECTED.  The SARS-CoV-2 RNA is generally detectable in upper respiratory specimens during the acute phase of infection. The lowest concentration of SARS-CoV-2 viral copies this assay can detect is 138 copies/mL. A negative result does not preclude SARS-Cov-2 infection and should not be used as the sole basis for treatment or other patient management  decisions. A negative result may occur with  improper specimen collection/handling, submission of specimen other than nasopharyngeal swab, presence of viral mutation(s) within the areas targeted by this assay, and inadequate number of viral copies(<138 copies/mL). A negative result must be combined with clinical observations, patient history, and epidemiological information. The expected result is Negative.  Fact Sheet for Patients:  EntrepreneurPulse.com.au  Fact Sheet for Healthcare Providers:  IncredibleEmployment.be  This test is no t yet approved or cleared by the [place] FDA and  has been authorized for detection and/or diagnosis of SARS-CoV-2 by FDA under an Emergency Use Authorization (EUA). This EUA will remain  in effect (meaning this test can be used) for the duration of the COVID-19 declaration under Section 564(b)(1) of the Act, 21 U.S.C.section 360bbb-3(b)(1), unless  the authorization is terminated  or revoked sooner.       Influenza A by PCR NEGATIVE NEGATIVE Final   Influenza B by PCR NEGATIVE NEGATIVE Final    Comment: (NOTE) The Xpert Xpress SARS-CoV-2/FLU/RSV plus assay is intended as an aid in the diagnosis of influenza from Nasopharyngeal swab specimens and should not be used as a sole basis for treatment. Nasal washings and aspirates are unacceptable for Xpert Xpress SARS-CoV-2/FLU/RSV testing.  Fact Sheet for Patients: EntrepreneurPulse.com.au  Fact Sheet for Healthcare Providers: IncredibleEmployment.be  This test is not yet approved or cleared by the [place] FDA and has been authorized for detection and/or diagnosis of SARS-CoV-2 by FDA under an Emergency Use Authorization (EUA). This EUA will remain in effect (meaning this test can be used) for the duration of the COVID-19 declaration under Section 564(b)(1) of the Act, 21 U.S.C. section 360bbb-3(b)(1), unless the authorization is terminated or revoked.  Performed at [facility], [place] [address], [place], [place] [address]     Microbiology: Recent Results (from the past 240 hour(s))  Resp Panel by RT-PCR (Flu A&B, Covid) Nasopharyngeal Swab     Status: None   Collection Time: 06/29/21  9:55 AM   Specimen: Nasopharyngeal Swab; Nasopharyngeal(NP) swabs in vial transport medium  Result Value Ref Range Status   SARS Coronavirus 2 by RT PCR NEGATIVE NEGATIVE Final    Comment: (NOTE) SARS-CoV-2 target nucleic acids are NOT DETECTED.  The SARS-CoV-2 RNA is generally detectable in upper respiratory specimens during the acute phase of infection. The lowest concentration of SARS-CoV-2 viral copies this assay can detect is 138 copies/mL. A negative result does not preclude SARS-Cov-2 infection and should not be used as the sole basis for treatment or other patient management decisions. A negative result may occur with  improper  specimen collection/handling, submission of specimen other than nasopharyngeal swab, presence of viral mutation(s) within the areas targeted by this assay, and inadequate number of viral copies(<138 copies/mL). A negative result must be combined with clinical observations, patient history, and epidemiological information. The expected result is Negative.  Fact Sheet for Patients:  EntrepreneurPulse.com.au  Fact Sheet for Healthcare Providers:  IncredibleEmployment.be  This test is no t yet approved or cleared by the [place] FDA and  has been authorized for detection and/or diagnosis of SARS-CoV-2 by FDA under an Emergency Use Authorization (EUA). This EUA will remain  in effect (meaning this test can be used) for the duration of the COVID-19 declaration under Section 564(b)(1) of the Act, 21 U.S.C.section 360bbb-3(b)(1), unless the authorization is terminated  or revoked sooner.       Influenza A by PCR NEGATIVE NEGATIVE Final   Influenza B by PCR NEGATIVE  NEGATIVE Final    Comment: (NOTE) The Xpert Xpress SARS-CoV-2/FLU/RSV plus assay is intended as an aid in the diagnosis of influenza from Nasopharyngeal swab specimens and should not be used as a sole basis for treatment. Nasal washings and aspirates are unacceptable for Xpert Xpress SARS-CoV-2/FLU/RSV testing.  Fact Sheet for Patients: EntrepreneurPulse.com.au  Fact Sheet for Healthcare Providers: IncredibleEmployment.be  This test is not yet approved or cleared by the [place] FDA and has been authorized for detection and/or diagnosis of SARS-CoV-2 by FDA under an Emergency Use Authorization (EUA). This EUA will remain in effect (meaning this test can be used) for the duration of the COVID-19 declaration under Section 564(b)(1) of the Act, 21 U.S.C. section 360bbb-3(b)(1), unless the authorization is terminated or revoked.  Performed at  [facility], [place] [address], [place], [place] [address]     Radiographs and labs were personally reviewed by me.   Bobby Rumpf, MD [facility] [phone] 06/29/2021, 1:29 PM

## 2021-06-29 NOTE — H&P (Addendum)
History and Physical    Yvette Gaines YTK:[id number] DOB: 02/17/1971 DOA: 06/29/2021  PCP: [name], DO (Confirm with patient/family/NH records and if not entered, this has to be entered at [facility] point of entry) Patient coming from: [facility]  I have personally briefly reviewed patient's old medical records in [place]  Chief Complaint: Feeling ok  HPI: Yvette Gaines is a 50 y.o. 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 seeking treatment.  Patient denied any fever chills, no chest pain no shortness of breath. Claiming that she "was not treated well there at [facility]" and decided to sign AMA this morning.  Patient presented to [facility] on 10/05 for worsening of left shoulder swelling and pain.  Was found to have abscess of left shoulder and left cough, I&D was done on 10/06 and culture showed MRSA.[name]  Patient however signed out [place] after the surgery. On 10/16, patient came back to [facility] complaining about new onset of chest pains and shortness of breath, CT chest showed multiple bilateral septic emboli, echocardiogram showed new onset of tricuspid vegetation compatible with endocarditis.  Blood culture showed again MRSA.  Patient was started on vancomycin and cefepime since. During hospital stay, patient was also found to develop acute thrombocytopenia, DIC was ruled out.  ED Course: Afebrile, blood pressure stable.  WBC 16.9.  Review of Systems: As per HPI otherwise 14 point review of systems negative.    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 She has been smoking an average of 1 pack per day. She has never used smokeless tobacco. She reports current drug use. She reports that she does not drink alcohol.  No Known Allergies  Family History  Problem Relation Age of Onset   Cancer Brother        brain, lung, spine   Cancer Maternal Aunt        bone and lung   Cancer Paternal Grandfather        prostate   Arthritis Paternal Grandfather    Hypertension Paternal Grandfather    Arthritis Mother    Mental illness Mother    Arthritis Father    Heart disease Father    Hypertension Father    Arthritis Maternal Grandmother    Diabetes Maternal Grandmother    Arthritis Maternal Grandfather    Arthritis Paternal Grandmother      Prior to Admission medications   Medication Sig Start Date End Date Taking? Authorizing Provider  albuterol (PROVENTIL HFA;VENTOLIN HFA) 108 (90 BASE) MCG/ACT inhaler Inhale 2 puffs into the lungs every 6 (six) hours as needed for wheezing or shortness of breath. 08/09/15  Yes Cook, Jayce G, DO  budesonide-formoterol (SYMBICORT) 160-4.5 MCG/ACT inhaler Inhale 2 puffs into the lungs  2 (two) times daily. Patient not taking: No sig reported 05/08/16   [name], DO  metroNIDAZOLE (FLAGYL) 500 MG tablet Take 1 tablet (500 mg total) by mouth 2 (two) times daily. Patient not taking: No sig reported 05/14/16   [name], DO  sertraline (ZOLOFT) 50 MG tablet Take 1 tablet (50 mg total) by mouth daily. Patient not taking: No sig reported 05/08/16   [name], [place]    Physical Exam: Vitals:   06/29/21 1015 06/29/21 1030 06/29/21 1100 06/29/21 1130  BP: 115/80 119/76 124/81 119/85  Pulse: 66 75 72 66  Resp: 12 19 18 18   Temp:      TempSrc:      SpO2: 100%  100% 99% 100%  Weight:   60.3 kg   Height:   5\' 2"  (1.575 m)     Constitutional: NAD, calm, comfortable Vitals:   06/29/21 1015 06/29/21 1030 06/29/21 1100 06/29/21 1130  BP: 115/80 119/76 124/81 119/85  Pulse: 66 75 72 66  Resp: 12 19 18 18   Temp:      TempSrc:      SpO2: 100% 100% 99% 100%  Weight:   60.3 kg   Height:   5\' 2"  (1.575 m)    Eyes: PERRL, lids and conjunctivae normal ENMT: Mucous membranes are moist. Posterior pharynx clear of any exudate or lesions.Normal dentition.  Neck: normal, supple, no masses, no thyromegaly Respiratory: clear to auscultation bilaterally, no wheezing, no crackles. Normal respiratory effort. No accessory muscle use.  Cardiovascular: Regular rate and rhythm, no murmurs / rubs / gallops. No extremity edema. 2+ pedal pulses. No carotid bruits.  Abdomen: no tenderness, no masses palpated. No hepatosplenomegaly. Bowel sounds positive.  Musculoskeletal: no clubbing / cyanosis. No joint deformity upper and lower extremities. Good ROM, no contractures. Normal muscle tone.  Skin: no rashes, lesions, ulcers. No induration Neurologic: CN 2-12 grossly intact. Sensation intact, DTR normal. Strength 5/5 in all 4.  Psychiatric: Normal judgment and insight. Alert and oriented x 3. Normal mood.     Labs on Admission: I have personally reviewed following labs and imaging studies  CBC: Recent Labs  Lab 06/29/21 0924  WBC 16.9*  HGB 8.5*  HCT 27.4*  MCV 90.1  PLT 916   Basic Metabolic Panel: Recent Labs  Lab 06/29/21 0924  NA 131*  K 3.7  CL 103  CO2 17*  GLUCOSE 98  BUN 13  CREATININE 0.61  CALCIUM 7.7*   GFR: Estimated Creatinine Clearance: 72 mL/min (by C-G formula based on SCr of 0.61 mg/dL). Liver Function Tests: Recent Labs  Lab 06/29/21 0924  AST 40  ALT 50*  ALKPHOS 167*  BILITOT 1.2  PROT 6.0*  ALBUMIN 1.8*   No results for input(s): LIPASE, AMYLASE in the last 168 hours. No results for input(s): AMMONIA in the last 168  hours. Coagulation Profile: No results for input(s): INR, PROTIME in the last 168 hours. Cardiac Enzymes: No results for input(s): CKTOTAL, CKMB, CKMBINDEX, TROPONINI in the last 168 hours. BNP (last 3 results) No results for input(s): PROBNP in the last 8760 hours. HbA1C: No results for input(s): HGBA1C in the last 72 hours. CBG: No results for input(s): GLUCAP in the last 168 hours. Lipid Profile: No results for input(s): CHOL, HDL, LDLCALC, TRIG, CHOLHDL, LDLDIRECT in the last 72 hours. Thyroid Function Tests: No results for input(s): TSH, T4TOTAL, FREET4, T3FREE, THYROIDAB in the last 72 hours. Anemia Panel: No results for input(s): VITAMINB12, FOLATE, FERRITIN, TIBC, IRON,  RETICCTPCT in the last 72 hours. Urine analysis:    Component Value Date/Time   COLORURINE YELLOW (A) 07/04/2015 1709   APPEARANCEUR CLOUDY (A) 07/04/2015 1709   LABSPEC 1.015 07/04/2015 1709   PHURINE 5.0 07/04/2015 1709   GLUCOSEU NEGATIVE 07/04/2015 1709   HGBUR NEGATIVE 07/04/2015 1709   BILIRUBINUR NEGATIVE 07/04/2015 1709   KETONESUR NEGATIVE 07/04/2015 1709   PROTEINUR NEGATIVE 07/04/2015 1709   UROBILINOGEN 0.2 06/28/2015 1150   NITRITE NEGATIVE 07/04/2015 1709   LEUKOCYTESUR NEGATIVE 07/04/2015 1709    Radiological Exams on Admission: DG Chest 1 View  Result Date: 06/29/2021 CLINICAL DATA:  Chest pain, short of breath.  Endocarditis EXAM: CHEST  1 VIEW COMPARISON:  06/28/2021.  Chest CT 06/24/2021 FINDINGS: Numerous bilateral pulmonary nodules some of which are cavitated. These are best seen on the chest CT. Heart size and vascularity normal. Minimal right pleural effusion. No left effusion. No lobar infiltrate. IMPRESSION: Multiple pulmonary nodules with cavitation. Findings are most suggestive of septic emboli. Small right effusion. Electronically Signed   By: Franchot Gallo M.D.   On: 06/29/2021 10:26   [place] EKG SITE RITE  Result Date: 06/29/2021 If Site Rite image not attached, placement  could not be confirmed due to current cardiac rhythm.   EKG: Independently reviewed. Sinus, no PR or QTC changes.  Assessment/Plan Active Problems:   Endocarditis  (please populate well all problems here in Problem List. (For example, if patient is on BP meds at home and you resume or decide to hold them, it is a problem that needs to be her. Same for CAD, COPD, HLD and so on)  Subacute infectious endocarditis, MRSA -Tricuspid valve, secondary to IV drug abuse. -Consult infectious disease for further long-term antibiotic management.  Septic pulm emboli -Secondary from the tricuspid endocarditis, as above.  Difficult IV access -ED tried multiple times to establish IV access, none successful.  D/W with PICC team, for now probably will first establish a peripheral IV line, once case manager sets up placement plan will switch to PICC. -Consult case management for placement to complete 6 weeks antibiotics preferably in the facility given history of IVDU.  IVDU -CIWA protocol, as needed clonidine, hydroxyzine and Bentyl. -NSAIDS for pain, avoid narcotics. D/W pharmacy, patient not a candidate to initiate Suboxone due to unsure follow-ups.  Recent thrombocytopenia -Resolved during last admission at [facility], DIC was ruled out.  Unsure about work-up for HIT. Will hold off chemical DVT prophylaxis for now.  Recent right shoulder abscess -Status post I&D on 10/06 at [facility], according to records and imaging study and orthopedic note, there was no infection on [facility] joint itself, and patient recovered well. No acute issue now.  Hypoalbuminemia -Check UA to rule out proteinuria.   Moderate protein calorie malnutrition -Start protein supplement  Mild intermittent asthma -At baseline  Cigarette smoking -Nicotine patch.  DVT prophylaxis: SCD Code Status: Full code Family Communication: None at bedside Disposition Plan: Expect more than 2 midnight hospital stay Consults called:  ID. Admission status: MedSurg admit   Lequita Halt MD [facility] Pager [phone]  06/29/2021, 1:09 PM

## 2021-06-29 NOTE — Progress Notes (Signed)
Pharmacy Antibiotic Note  Yvette Gaines is a 50 y.o. female admitted on 06/29/2021 with  MRSA bacteremia .  Pharmacy has been consulted for vancomycin dosing. Patient left OSH AMA - was on vancomycin 1000mg  IV q8h - trough of 11.7 on 10/21 @ 0600 noted in chart. Last dose of vancomycin on 10/21 @ 0400. This regimen above gave an AUC per our calculator of ~900. IV access is still trying to be obtained by IV team per note.   Plan: Reduce vancomycin to 750mg  IV q12h (eAUC 479 - goal 400-600), Cr rounded to 0.8mg /dL, Vd 0.72 -Monitor renal function, clinical status, and antibiotic plan -Consider obtaining VT and VP Sun PM/Mon AM to get AUC info - didn't order right now in case time changes come Sunday.  Height: 5\' 2"  (157.5 cm) Weight: 60.3 kg (132 lb 15 oz) IBW/kg (Calculated) : 50.1  Temp (24hrs), Avg:97.9 F (36.6 C), Min:97.9 F (36.6 C), Max:97.9 F (36.6 C)  Recent Labs  Lab 06/29/21 0924 06/29/21 1123  WBC 16.9*  --   CREATININE 0.61  --   LATICACIDVEN  --  1.1    Estimated Creatinine Clearance: 72 mL/min (by C-G formula based on SCr of 0.61 mg/dL).    Antimicrobials this admission: Vanc started @ OSH  unknown start date  >>   Dose adjustments this admission: N/A  Microbiology results: 10/21 BCx:  10/21 UCx:  MRSA+ at OSH  Thank you for allowing pharmacy to be a part of this patient's care.  Joetta Manners, PharmD, [facility] Emergency Medicine Clinical Pharmacist ED RPh Phone: [place]: [phone]

## 2021-06-29 NOTE — Progress Notes (Signed)
Spoke to DR Arlyss Gandy PICC insertion is not suitable for the patient at this time. IV team will try PIV insertion .

## 2021-06-29 NOTE — ED Triage Notes (Signed)
Pt. Stated, I take Heroin, last dose of Heroin was 4 days ago. Today given Clonidine before I left.

## 2021-06-29 NOTE — ED Provider Notes (Signed)
I personally evaluated the patient during the encounter and completed a history, physical, procedures, medical decision making to contribute to the overall care of the patient and decision making for the patient briefly, the patient is a 50 y.o. female with chest pain.  Normal vitals.  No fever.  Was admitted at [facility] for endocarditis and was on IV antibiotics through a PICC line.  She states that she started off in an ICU and then was being transferred to a different hospital.  She left AMA after being in the emergency department for 24 hours while waiting for hospital bed.  Patient arrives here with normal vitals.  No fever.  We will try to obtain outside hospital records but her discharge paperwork appears to say that she was diagnosed with endocarditis with septic emboli.  She was on vancomycin and Zosyn.  Will start her back on IV antibiotics and get repeat blood work.  Anticipate admission to the hospital for further IV antibiotics and further endocarditis care. Chest x-ray consistent with septic emboli/infectious process.  White count of 17.  PICC line has been ordered and will need to hospitalist service.  This chart was dictated using voice recognition software.  Despite best efforts to proofread,  errors can occur which can change the documentation meaning.    EKG Interpretation  Date/Time:  Friday June 29 2021 09:18:55 EDT Ventricular Rate:  77 PR Interval:  108 QRS Duration: 96 QT Interval:  396 QTC Calculation: 448 R Axis:   68 Text Interpretation: Sinus rhythm with short PR Otherwise normal ECG Confirmed by Ronnald Nian, Jezreel Sisk ([id number]) on 06/29/2021 10:39:40 AM            Lennice Sites, DO 06/29/21 1129

## 2021-06-29 NOTE — ED Notes (Signed)
IV team at bedside 

## 2021-06-29 NOTE — ED Notes (Signed)
IV team RN contacted PICC line RN will call for expected placement time. MD's notified.

## 2021-06-29 NOTE — ED Notes (Signed)
Unsuccessful x 2 IV attempts. Lab at bedside for attempted blood draw. PICC line ordered.

## 2021-06-29 NOTE — ED Notes (Signed)
Resting eyes closed, respirations even and unlabored.

## 2021-06-29 NOTE — ED Notes (Signed)
Called and spoke with pharmacist regarding suboxone. Stated he would send it to the floor.

## 2021-06-30 DIAGNOSIS — I2690 Septic pulmonary embolism without acute cor pulmonale: Secondary | ICD-10-CM | POA: Diagnosis not present

## 2021-06-30 DIAGNOSIS — B9562 Methicillin resistant Staphylococcus aureus infection as the cause of diseases classified elsewhere: Secondary | ICD-10-CM

## 2021-06-30 DIAGNOSIS — B182 Chronic viral hepatitis C: Secondary | ICD-10-CM

## 2021-06-30 DIAGNOSIS — I330 Acute and subacute infective endocarditis: Secondary | ICD-10-CM | POA: Diagnosis not present

## 2021-06-30 DIAGNOSIS — R7881 Bacteremia: Secondary | ICD-10-CM | POA: Diagnosis not present

## 2021-06-30 DIAGNOSIS — F1120 Opioid dependence, uncomplicated: Secondary | ICD-10-CM

## 2021-06-30 LAB — BLOOD CULTURE ID PANEL (REFLEXED) - BCID2

## 2021-06-30 LAB — BASIC METABOLIC PANEL
Anion gap: 8 (ref 5–15)
BUN: 11 mg/dL (ref 6–20)
CO2: 19 mmol/L — ABNORMAL LOW (ref 22–32)
Calcium: 7.3 mg/dL — ABNORMAL LOW (ref 8.9–10.3)
Chloride: 103 mmol/L (ref 98–111)
Creatinine, Ser: 0.65 mg/dL (ref 0.44–1.00)
GFR, Estimated: >60 mL/min (ref >60)
Glucose, Bld: 99 mg/dL (ref 70–99)
Potassium: 3.2 mmol/L — ABNORMAL LOW (ref 3.5–5.1)
Sodium: 130 mmol/L — ABNORMAL LOW (ref 135–145)

## 2021-06-30 LAB — CBC
HCT: 23.2 % — ABNORMAL LOW (ref 36.0–46.0)
Hemoglobin: 7.6 g/dL — ABNORMAL LOW (ref 12.0–15.0)
MCH: 27.6 pg (ref 26.0–34.0)
MCHC: 32.8 g/dL (ref 30.0–36.0)
MCV: 84.4 fL (ref 80.0–100.0)
Platelets: 404 10*3/uL — ABNORMAL HIGH (ref 150–400)
RBC: 2.75 MIL/uL — ABNORMAL LOW (ref 3.87–5.11)
RDW: 18 % — ABNORMAL HIGH (ref 11.5–15.5)
WBC: 16.3 10*3/uL — ABNORMAL HIGH (ref 4.0–10.5)
nRBC: 0.1 % (ref 0.0–0.2)

## 2021-06-30 LAB — FOLATE: Folate: 11.7 ng/mL (ref >5.9)

## 2021-06-30 LAB — RETICULOCYTES
Immature Retic Fract: 28 % — ABNORMAL HIGH (ref 2.3–15.9)
RBC.: 2.72 MIL/uL — ABNORMAL LOW (ref 3.87–5.11)
Retic Count, Absolute: 102.3 10*3/uL (ref 19.0–186.0)
Retic Ct Pct: 3.8 % — ABNORMAL HIGH (ref 0.4–3.1)

## 2021-06-30 LAB — IRON AND TIBC
Iron: 20 ug/dL — ABNORMAL LOW (ref 28–170)
Saturation Ratios: 9 % — ABNORMAL LOW (ref 10.4–31.8)
TIBC: 228 ug/dL — ABNORMAL LOW (ref 250–450)
UIBC: 208 ug/dL

## 2021-06-30 LAB — HEPATITIS PANEL, ACUTE
HCV Ab: REACTIVE — AB
Hep A IgM: NONREACTIVE
Hep B C IgM: NONREACTIVE
Hepatitis B Surface Ag: NONREACTIVE

## 2021-06-30 LAB — SEDIMENTATION RATE: Sed Rate: 35 mm/hr — ABNORMAL HIGH (ref 0–22)

## 2021-06-30 LAB — VITAMIN B12: Vitamin B-12: 590 pg/mL (ref 180–914)

## 2021-06-30 LAB — FERRITIN: Ferritin: 223 ng/mL (ref 11–307)

## 2021-06-30 LAB — RPR: RPR Ser Ql: NONREACTIVE

## 2021-06-30 MED ORDER — ENOXAPARIN SODIUM 40 MG/0.4ML IJ SOSY
40.0000 mg | PREFILLED_SYRINGE | INTRAMUSCULAR | Status: DC
  Administered 2021-06-30 – 2021-08-05 (×37): 40 mg via SUBCUTANEOUS
  Filled 2021-06-30 (×37): qty 0.4

## 2021-06-30 MED ORDER — FAMOTIDINE 20 MG PO TABS
20.0000 mg | ORAL_TABLET | Freq: Every day | ORAL | Status: DC
  Administered 2021-06-30 – 2021-08-19 (×51): 20 mg via ORAL
  Filled 2021-06-30 (×51): qty 1

## 2021-06-30 MED ORDER — POTASSIUM CHLORIDE CRYS ER 20 MEQ PO TBCR
40.0000 meq | EXTENDED_RELEASE_TABLET | Freq: Once | ORAL | Status: AC
  Administered 2021-06-30: 40 meq via ORAL
  Filled 2021-06-30: qty 2

## 2021-06-30 NOTE — Progress Notes (Signed)
PROGRESS NOTE    Yvette Gaines  NKN:[id number] DOB: [birth year] DOA: 06/29/2021 PCP: [name], DO    Chief Complaint  Patient presents with   Chest Pain   Weakness   Shortness of Breath   Drug Problem    Brief Narrative:   Yvette Gaines is a 50 y.o. 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   Patient presented to [facility] on 10/05 for worsening of left shoulder swelling and pain.  Was found to have abscess of left shoulder and left calf,  I&D was done on 10/06 and culture showed MRSA.[name]  Patient however signed out [employer] after the surgery. On 10/16, patient came back to [facility] complaining about new onset of chest pains and shortness of breath, CT chest showed multiple bilateral septic emboli, echocardiogram showed new onset of tricuspid vegetation compatible with endocarditis.  Blood culture showed again MRSA.  Patient was started on vancomycin and cefepime since. During hospital stay, patient was also found to develop acute thrombocytopenia, DIC was ruled out.  Patient was admitted to [facility], and started on IV vancomycin, blood cultures on admission still growing MRSA.     Assessment & Plan:   Principal Problem:   MRSA bacteremia Active Problems:   Severe opioid use disorder ([place])   Endocarditis   Septic pulmonary embolism (HCC)   Chronic viral hepatitis C (HCC)   MRSA bacteremia/MRSA TV endocarditis/septic emboli/recent right shoulder and right calf abscess status post I&D 10/6 at [facility] . -Blood culture on admission still growing MRSA, follow on surveillance blood cultures. -Continue with IV vancomycin for now. -Will need TEE per ID recommendations - CT surgery consulted - Avoid PICC line currently due to bacteremia  Recent  thrombocytopenia -Resolved during last admission at [facility], DIC was ruled out.    Recent right shoulder abscess -Status post I&D on 10/06 at [facility], according to records and imaging study and orthopedic note, there was no infection on [facility] joint itself, and patient recovered well. No acute issue now.   Moderate protein calorie malnutrition -Start protein supplement   Mild intermittent asthma -At baseline   Cigarette smoking -Nicotine patch.  Hypokalemia - repleted   Hyponatremia - will keep on fluids restriction  IV drug abuse - started on Suboxone, avoid narcotics.  Normocytic anemia -This is most likely anemia of acute illness, will check anemia panel, will transfuse for hemoglobin<7.   +HCV AB - check HCV RNA  DVT prophylaxis: Heparin Code Status: Full Family Communication: None at bedside Disposition:   Status is: Inpatient  Remains inpatient appropriate because: Bacteremia with need for IV antibiotics       Consultants:  ID CT surgery   Subjective:  Patient febrile 101 , she does report generalized body ache, denies any vomiting, no diarrhea.  Awake Alert,      Objective: Vitals:   06/29/21 2348 06/30/21 0422 06/30/21 0727 06/30/21 1437  BP: 124/82 139/81 (!) 135/91   Pulse: 94 95 99   Resp: 18 18 16    Temp: 99.1 F (37.3 C) 99.3 F (37.4 C) (!) 101.1 F (38.4 C) (!) 101 F (38.3 C)  TempSrc: Oral Axillary Oral Oral  SpO2: 99% 98% 98%   Weight:      Height:        Intake/Output Summary (Last 24 hours) at 06/30/2021 1506  Last data filed at 06/30/2021 0206 Gross per 24 hour  Intake 400 ml  Output --  Net 400 ml   Filed Weights   06/29/21 1100  Weight: 60.3 kg    Examination:  Awake Alert, Oriented X 3, chronically ill-appearing. Symmetrical Chest wall movement, Good air movement bilaterally, breath sounds bilaterally RRR,No Gallops,Rubs or new Murmurs, No Parasternal Heave +ve B.Sounds, Abd Soft, No tenderness, No rebound  - guarding or rigidity. No Cyanosis, Clubbing or edema, rash is improving left shoulder wound healing,left calf wound healing.    Data Reviewed: I have personally reviewed following labs and imaging studies  CBC: Recent Labs  Lab 06/29/21 0924 06/30/21 0127  WBC 16.9* 16.3*  HGB 8.5* 7.6*  HCT 27.4* 23.2*  MCV 90.1 84.4  PLT 336 404*    Basic Metabolic Panel: Recent Labs  Lab 06/29/21 0924 06/30/21 0127  NA 131* 130*  K 3.7 3.2*  CL 103 103  CO2 17* 19*  GLUCOSE 98 99  BUN 13 11  CREATININE 0.61 0.65  CALCIUM 7.7* 7.3*    GFR: Estimated Creatinine Clearance: 72 mL/min (by C-G formula based on SCr of 0.65 mg/dL).  Liver Function Tests: Recent Labs  Lab 06/29/21 0924  AST 40  ALT 50*  ALKPHOS 167*  BILITOT 1.2  PROT 6.0*  ALBUMIN 1.8*    CBG: No results for input(s): GLUCAP in the last 168 hours.   Recent Results (from the past 240 hour(s))  Blood culture (routine x 2)     Status: None (Preliminary result)   Collection Time: 06/29/21  9:54 AM   Specimen: BLOOD RIGHT HAND  Result Value Ref Range Status   Specimen Description BLOOD RIGHT HAND  Final   Special Requests   Final    BOTTLES DRAWN AEROBIC ONLY Blood Culture results may not be optimal due to an inadequate volume of blood received in culture bottles   Culture  Setup Time   Final    GRAM POSITIVE COCCI AEROBIC BOTTLE ONLY CRITICAL RESULT CALLED TO, READ BACK BY AND VERIFIED WITH: G,BARR PHARMD @1338  06/30/21 EB Performed at [facility], [place] [address] [place], [place] [address]    Culture GRAM POSITIVE COCCI  Final   Report Status PENDING  Incomplete  Blood Culture ID Panel (Reflexed)     Status: Abnormal   Collection Time: 06/29/21  9:54 AM  Result Value Ref Range Status   Enterococcus faecalis NOT DETECTED NOT DETECTED Final   Enterococcus Faecium NOT DETECTED NOT DETECTED Final   Listeria monocytogenes NOT DETECTED NOT DETECTED Final   Staphylococcus species DETECTED (A) NOT  DETECTED Final    Comment: CRITICAL RESULT CALLED TO, READ BACK BY AND VERIFIED WITH: G,BARR PHARMD @1338  06/30/21 EB    Staphylococcus aureus (BCID) DETECTED (A) NOT DETECTED Final    Comment: Methicillin (oxacillin)-resistant Staphylococcus aureus (MRSA). MRSA is predictably resistant to beta-lactam antibiotics (except ceftaroline). Preferred therapy is vancomycin unless clinically contraindicated. Patient requires contact precautions if  hospitalized. CRITICAL RESULT CALLED TO, READ BACK BY AND VERIFIED WITH: G,BARR PHARMD @1338  06/30/21 EB    Staphylococcus epidermidis NOT DETECTED NOT DETECTED Final   Staphylococcus lugdunensis NOT DETECTED NOT DETECTED Final   Streptococcus species NOT DETECTED NOT DETECTED Final   Streptococcus agalactiae NOT DETECTED NOT DETECTED Final   Streptococcus pneumoniae NOT DETECTED NOT DETECTED Final   Streptococcus pyogenes NOT DETECTED NOT DETECTED Final   A.calcoaceticus-baumannii NOT DETECTED NOT DETECTED Final   Bacteroides fragilis NOT DETECTED NOT DETECTED Final  Enterobacterales NOT DETECTED NOT DETECTED Final   Enterobacter cloacae complex NOT DETECTED NOT DETECTED Final   Escherichia coli NOT DETECTED NOT DETECTED Final   Klebsiella aerogenes NOT DETECTED NOT DETECTED Final   Klebsiella oxytoca NOT DETECTED NOT DETECTED Final   Klebsiella pneumoniae NOT DETECTED NOT DETECTED Final   Proteus species NOT DETECTED NOT DETECTED Final   Salmonella species NOT DETECTED NOT DETECTED Final   Serratia marcescens NOT DETECTED NOT DETECTED Final   Haemophilus influenzae NOT DETECTED NOT DETECTED Final   Neisseria meningitidis NOT DETECTED NOT DETECTED Final   Pseudomonas aeruginosa NOT DETECTED NOT DETECTED Final   Stenotrophomonas maltophilia NOT DETECTED NOT DETECTED Final   Candida albicans NOT DETECTED NOT DETECTED Final   Candida auris NOT DETECTED NOT DETECTED Final   Candida glabrata NOT DETECTED NOT DETECTED Final   Candida krusei NOT  DETECTED NOT DETECTED Final   Candida parapsilosis NOT DETECTED NOT DETECTED Final   Candida tropicalis NOT DETECTED NOT DETECTED Final   Cryptococcus neoformans/gattii NOT DETECTED NOT DETECTED Final   Meth resistant mecA/C and MREJ DETECTED (A) NOT DETECTED Final    Comment: CRITICAL RESULT CALLED TO, READ BACK BY AND VERIFIED WITH: G,BARR PHARMD @1338  06/30/21 EB Performed at [facility], [address] [place], [place] [address]   Resp Panel by RT-PCR (Flu A&B, Covid) Nasopharyngeal Swab     Status: None   Collection Time: 06/29/21  9:55 AM   Specimen: Nasopharyngeal Swab; Nasopharyngeal(NP) swabs in vial transport medium  Result Value Ref Range Status   SARS Coronavirus 2 by RT PCR NEGATIVE NEGATIVE Final    Comment: (NOTE) SARS-CoV-2 target nucleic acids are NOT DETECTED.  The SARS-CoV-2 RNA is generally detectable in upper respiratory specimens during the acute phase of infection. The lowest concentration of SARS-CoV-2 viral copies this assay can detect is 138 copies/mL. A negative result does not preclude SARS-Cov-2 infection and should not be used as the sole basis for treatment or other patient management decisions. A negative result may occur with  improper specimen collection/handling, submission of specimen other than nasopharyngeal swab, presence of viral mutation(s) within the areas targeted by this assay, and inadequate number of viral copies(<138 copies/mL). A negative result must be combined with clinical observations, patient history, and epidemiological information. The expected result is Negative.  Fact Sheet for Patients:  EntrepreneurPulse.com.au  Fact Sheet for Healthcare Providers:  IncredibleEmployment.be  This test is no t yet approved or cleared by the [place] FDA and  has been authorized for detection and/or diagnosis of SARS-CoV-2 by FDA under an Emergency Use Authorization (EUA). This EUA will remain   in effect (meaning this test can be used) for the duration of the COVID-19 declaration under Section 564(b)(1) of the Act, 21 U.S.C.section 360bbb-3(b)(1), unless the authorization is terminated  or revoked sooner.       Influenza A by PCR NEGATIVE NEGATIVE Final   Influenza B by PCR NEGATIVE NEGATIVE Final    Comment: (NOTE) The Xpert Xpress SARS-CoV-2/FLU/RSV plus assay is intended as an aid in the diagnosis of influenza from Nasopharyngeal swab specimens and should not be used as a sole basis for treatment. Nasal washings and aspirates are unacceptable for Xpert Xpress SARS-CoV-2/FLU/RSV testing.  Fact Sheet for Patients: EntrepreneurPulse.com.au  Fact Sheet for Healthcare Providers: IncredibleEmployment.be  This test is not yet approved or cleared by the [place] FDA and has been authorized for detection and/or diagnosis of SARS-CoV-2 by FDA under an Emergency Use Authorization (EUA). This  EUA will remain in effect (meaning this test can be used) for the duration of the COVID-19 declaration under Section 564(b)(1) of the Act, 21 U.S.C. section 360bbb-3(b)(1), unless the authorization is terminated or revoked.  Performed at [facility], [place] [address], [place], [place] [address]   Blood culture (routine x 2)     Status: None (Preliminary result)   Collection Time: 06/29/21  3:15 PM   Specimen: BLOOD  Result Value Ref Range Status   Specimen Description BLOOD BLOOD LEFT FOREARM  Final   Special Requests   Final    BOTTLES DRAWN AEROBIC AND ANAEROBIC Blood Culture adequate volume   Culture   Final    NO GROWTH < 24 HOURS Performed at [facility], [place] [address], [place],  [address]    Report Status PENDING  Incomplete         Radiology Studies: DG Chest 1 View  Result Date: 06/29/2021 CLINICAL DATA:  Chest pain, short of breath.  Endocarditis EXAM: CHEST  1 VIEW COMPARISON:  06/28/2021.  Chest CT  06/24/2021 FINDINGS: Numerous bilateral pulmonary nodules some of which are cavitated. These are best seen on the chest CT. Heart size and vascularity normal. Minimal right pleural effusion. No left effusion. No lobar infiltrate. IMPRESSION: Multiple pulmonary nodules with cavitation. Findings are most suggestive of septic emboli. Small right effusion. Electronically Signed   By: Franchot Gallo M.D.   On: 06/29/2021 10:26   [place] EKG SITE RITE  Result Date: 06/29/2021 If Site Rite image not attached, placement could not be confirmed due to current cardiac rhythm.       Scheduled Meds:  buprenorphine-naloxone  1 tablet Sublingual Daily   cloNIDine  0.1 mg Oral QID   Followed by   Derrill Memo ON 07/01/2021] cloNIDine  0.1 mg Oral BH-qamhs   Followed by   Derrill Memo ON 07/04/2021] cloNIDine  0.1 mg Oral QAC breakfast   feeding supplement  237 mL Oral BID BM   nicotine  21 mg Transdermal Daily   Continuous Infusions:  vancomycin 750 mg (06/30/21 0827)     LOS: 1 day       Phillips Climes, MD [facility]   To contact the attending provider between 7A-7P or the covering provider during after hours 7P-7A, please log into the web site [email or web address] and access using universal [place] password for that web site. If you do not have the password, please call the hospital operator.  06/30/2021, 3:06 PM

## 2021-06-30 NOTE — Progress Notes (Signed)
PHARMACY - PHYSICIAN COMMUNICATION CRITICAL VALUE ALERT - BLOOD CULTURE IDENTIFICATION (BCID)  Yvette Gaines is an 50 y.o. female who presented to [facility] on 06/29/2021 with a chief complaint.   Assessment:  1/3 GPC (aerobic) s. Aureus and positive for mec a   Name of physician (or Provider) Contacted: Dr. Juleen China & Dr. Waldron Labs  Current antibiotics: vancomycin   Changes to prescribed antibiotics recommended:  Patient is on recommended antibiotics - No changes needed  No results found for this or any previous visit.  Cristela Felt, PharmD, BCPS Clinical Pharmacist 06/30/2021 1:39 PM

## 2021-06-30 NOTE — Progress Notes (Addendum)
Yvette Gaines for Infectious Disease  Date of Admission:  06/29/2021           Reason for visit: Follow up on MRSA IE  Current antibiotics: Vanco  ASSESSMENT:    50 y.o. female admitted with:  MRSA TV endocarditis.  She reports no prior history of valve replacement Recent left shoulder and left calf abscess s/p I&D 10/6 (at [facility]) Septic pulmonary emboli Rash, suspected vasculitis due to #1 Opiate use disorder HCV Ab positive  RECOMMENDATIONS:    Continue vancomycin TEE CT surgery eval Repeat blood cultures as 10/21 cx remain positive Check HCV RNA Will follow   Active Problems:   Severe opioid use disorder (HCC)   Endocarditis   Septic pulmonary embolism (HCC)   MRSA bacteremia   Chronic viral hepatitis C ([place])    MEDICATIONS:    Scheduled Meds:  buprenorphine-naloxone  1 tablet Sublingual Daily   cloNIDine  0.1 mg Oral QID   Followed by   Derrill Memo ON 07/01/2021] cloNIDine  0.1 mg Oral BH-qamhs   Followed by   Derrill Memo ON 07/04/2021] cloNIDine  0.1 mg Oral QAC breakfast   feeding supplement  237 mL Oral BID BM   nicotine  21 mg Transdermal Daily   Continuous Infusions:  vancomycin 750 mg (06/30/21 0827)   PRN Meds:.acetaminophen **OR** acetaminophen, albuterol, dicyclomine, hydrOXYzine, ketorolac, loperamide, LORazepam **OR** LORazepam, methocarbamol, ondansetron **OR** ondansetron (ZOFRAN) IV  SUBJECTIVE:   States she feels terrible and achy and fevers     OBJECTIVE:   Blood pressure (!) 135/91, pulse 99, temperature (!) 101.1 F (38.4 C), temperature source Oral, resp. rate 16, height 5\' 2"  (1.575 m), weight 60.3 kg, SpO2 98 %. Body mass index is 24.31 kg/m.  Physical Exam Constitutional:      Comments: Ill appearing woman lying in bed and uncomfortable appearing.   HENT:     Head: Normocephalic and atraumatic.  Eyes:     Extraocular Movements: Extraocular movements intact.     Conjunctiva/sclera: Conjunctivae normal.   Cardiovascular:     Rate and Rhythm: Normal rate and regular rhythm.  Pulmonary:     Effort: Pulmonary effort is normal. No respiratory distress.     Comments: Good air movement, diminished breath sounds. Abdominal:     General: There is no distension.     Palpations: Abdomen is soft.     Tenderness: There is no abdominal tenderness.  Skin:    Comments:    Neurological:     General: No focal deficit present.     Mental Status: She is oriented to person, place, and time.  Psychiatric:        Mood and Affect: Mood normal.        Behavior: Behavior normal.     Lab Results: Lab Results  Component Value Date   WBC 16.3 (H) 06/30/2021   HGB 7.6 (L) 06/30/2021   HCT 23.2 (L) 06/30/2021   MCV 84.4 06/30/2021   PLT 404 (H) 06/30/2021    Lab Results  Component Value Date   NA 130 (L) 06/30/2021   K 3.2 (L) 06/30/2021   CO2 19 (L) 06/30/2021   GLUCOSE 99 06/30/2021   BUN 11 06/30/2021   CREATININE 0.65 06/30/2021   CALCIUM 7.3 (L) 06/30/2021   GFRNONAA >60 06/30/2021   GFRAA >60 06/28/2015    Lab Results  Component Value Date   ALT 50 (H) 06/29/2021   AST 40 06/29/2021   ALKPHOS 167 (H) 06/29/2021   BILITOT  1.2 06/29/2021       Component Value Date/Time   CRP 17.6 (H) 06/29/2021 1657    No results found for: ESRSEDRATE   I have reviewed the micro and lab results in Epic.  Imaging: DG Chest 1 View  Result Date: 06/29/2021 CLINICAL DATA:  Chest pain, short of breath.  Endocarditis EXAM: CHEST  1 VIEW COMPARISON:  06/28/2021.  Chest CT 06/24/2021 FINDINGS: Numerous bilateral pulmonary nodules some of which are cavitated. These are best seen on the chest CT. Heart size and vascularity normal. Minimal right pleural effusion. No left effusion. No lobar infiltrate. IMPRESSION: Multiple pulmonary nodules with cavitation. Findings are most suggestive of septic emboli. Small right effusion. Electronically Signed   By: Franchot Gallo M.D.   On: 06/29/2021 10:26   [place] EKG  SITE RITE  Result Date: 06/29/2021 If Site Rite image not attached, placement could not be confirmed due to current cardiac rhythm.    Imaging independently reviewed in Epic.    Raynelle Highland for Infectious Disease [facility] [phone] pager 06/30/2021, 11:52 AM  I spent greater than 35 minutes with the patient including greater than 50% of time in face to face counsel of the patient and in coordination of their care.

## 2021-06-30 NOTE — Progress Notes (Addendum)
4 sutures on left shoulder and 3 sutures on left calf, both of which were from the I&D done on 10/06 at [facility], were removed by me in the ED on 10/22.

## 2021-07-01 DIAGNOSIS — B182 Chronic viral hepatitis C: Secondary | ICD-10-CM | POA: Diagnosis not present

## 2021-07-01 DIAGNOSIS — T826XXA Infection and inflammatory reaction due to cardiac valve prosthesis, initial encounter: Secondary | ICD-10-CM

## 2021-07-01 DIAGNOSIS — I330 Acute and subacute infective endocarditis: Secondary | ICD-10-CM

## 2021-07-01 DIAGNOSIS — R7881 Bacteremia: Secondary | ICD-10-CM | POA: Diagnosis not present

## 2021-07-01 DIAGNOSIS — I361 Nonrheumatic tricuspid (valve) insufficiency: Secondary | ICD-10-CM

## 2021-07-01 DIAGNOSIS — I2690 Septic pulmonary embolism without acute cor pulmonale: Secondary | ICD-10-CM | POA: Diagnosis not present

## 2021-07-01 DIAGNOSIS — B9562 Methicillin resistant Staphylococcus aureus infection as the cause of diseases classified elsewhere: Secondary | ICD-10-CM | POA: Diagnosis not present

## 2021-07-01 LAB — BASIC METABOLIC PANEL
Anion gap: 8 (ref 5–15)
BUN: 13 mg/dL (ref 6–20)
CO2: 19 mmol/L — ABNORMAL LOW (ref 22–32)
Calcium: 7.4 mg/dL — ABNORMAL LOW (ref 8.9–10.3)
Chloride: 102 mmol/L (ref 98–111)
Creatinine, Ser: 0.66 mg/dL (ref 0.44–1.00)
GFR, Estimated: >60 mL/min (ref >60)
Glucose, Bld: 132 mg/dL — ABNORMAL HIGH (ref 70–99)
Potassium: 3.7 mmol/L (ref 3.5–5.1)
Sodium: 129 mmol/L — ABNORMAL LOW (ref 135–145)

## 2021-07-01 LAB — CBC
HCT: 21.8 % — ABNORMAL LOW (ref 36.0–46.0)
Hemoglobin: 7.1 g/dL — ABNORMAL LOW (ref 12.0–15.0)
MCH: 27.6 pg (ref 26.0–34.0)
MCHC: 32.6 g/dL (ref 30.0–36.0)
MCV: 84.8 fL (ref 80.0–100.0)
Platelets: 402 10*3/uL — ABNORMAL HIGH (ref 150–400)
RBC: 2.57 MIL/uL — ABNORMAL LOW (ref 3.87–5.11)
RDW: 18.5 % — ABNORMAL HIGH (ref 11.5–15.5)
WBC: 12 10*3/uL — ABNORMAL HIGH (ref 4.0–10.5)
nRBC: 0 % (ref 0.0–0.2)

## 2021-07-01 LAB — PHOSPHORUS: Phosphorus: 2.6 mg/dL (ref 2.5–4.6)

## 2021-07-01 LAB — MAGNESIUM: Magnesium: 1.9 mg/dL (ref 1.7–2.4)

## 2021-07-01 MED ORDER — VANCOMYCIN HCL 750 MG/150ML IV SOLN
750.0000 mg | Freq: Two times a day (BID) | INTRAVENOUS | Status: DC
  Administered 2021-07-01 – 2021-07-06 (×10): 750 mg via INTRAVENOUS
  Filled 2021-07-01 (×11): qty 150

## 2021-07-01 MED ORDER — MAGIC MOUTHWASH
5.0000 mL | Freq: Four times a day (QID) | ORAL | Status: DC
  Administered 2021-07-01 – 2021-07-10 (×29): 5 mL via ORAL
  Filled 2021-07-01 (×42): qty 5

## 2021-07-01 MED ORDER — BUPRENORPHINE HCL-NALOXONE HCL 8-2 MG SL SUBL
1.5000 | SUBLINGUAL_TABLET | Freq: Every day | SUBLINGUAL | Status: DC
  Administered 2021-07-02 – 2021-07-12 (×11): 1.5 via SUBLINGUAL
  Filled 2021-07-01 (×5): qty 2
  Filled 2021-07-01: qty 1
  Filled 2021-07-01 (×5): qty 2

## 2021-07-01 MED ORDER — VALACYCLOVIR HCL 500 MG PO TABS
1000.0000 mg | ORAL_TABLET | Freq: Two times a day (BID) | ORAL | Status: AC
  Administered 2021-07-01 – 2021-07-07 (×13): 1000 mg via ORAL
  Filled 2021-07-01 (×16): qty 2

## 2021-07-01 NOTE — Progress Notes (Signed)
PROGRESS NOTE    Yvette Gaines  GEX:[id number] DOB: 05-Apr-1971 DOA: 06/29/2021 PCP: [name], DO    Chief Complaint  Patient presents with   Chest Pain   Weakness   Shortness of Breath   Drug Problem    Brief Narrative:   Yvette Gaines is a 50 y.o. 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   Patient presented to [facility] on 10/05 for worsening of left shoulder swelling and pain.  Was found to have abscess of left shoulder and left calf,  I&D was done on 10/06 and culture showed MRSA.[name]  Patient however signed out [place] after the surgery. On 10/16, patient came back to [facility] complaining about new onset of chest pains and shortness of breath, CT chest showed multiple bilateral septic emboli, echocardiogram showed new onset of tricuspid vegetation compatible with endocarditis.  Blood culture showed again MRSA.  Patient was started on vancomycin and cefepime since. During hospital stay, patient was also found to develop acute thrombocytopenia, DIC was ruled out.  Patient was admitted to [facility], and started on IV vancomycin, blood cultures on admission still growing MRSA.     Assessment & Plan:   Principal Problem:   MRSA bacteremia Active Problems:   Severe opioid use disorder ([place])   Endocarditis   Septic pulmonary embolism (HCC)   Chronic viral hepatitis C (HCC)   MRSA bacteremia/MRSA TV endocarditis/septic emboli/recent right shoulder and right calf abscess status post I&D 10/6 at [facility] . -Blood culture on admission still growing MRSA, follow on surveillance blood cultures. -Continue with IV vancomycin for now. -Will need TEE per ID recommendations, cardiology consulted regarding that - CT surgery consult appreciated, further  recommendation pending TEE and repeat blood cultures - Avoid PICC line currently due to bacteremia -Will be started on Magic mouthwash, and Valtrex twice daily for possible oral HSV lesion  Recent thrombocytopenia -Resolved during last admission at [facility], [place] was ruled out.    Recent right shoulder abscess -Status post I&D on 10/06 at [facility], according to records and imaging study and orthopedic note, there was no infection on [facility] joint itself, and patient recovered well. No acute issue now.   Moderate protein calorie malnutrition -Start protein supplement   Mild intermittent asthma -At baseline   Cigarette smoking -Nicotine patch.  Hypokalemia - repleted   Hyponatremia -trending down, will keep a fluid restriction, appears to be euvolemic.  IV drug abuse - started on Suboxone, avoid narcotics.  We will increase Suboxone to 1.5 tablets oral daily from tomorrow.  Normocytic anemia -This is most likely anemia of acute illness/anemia of chronic disease, anemia panel with normal W10, folic acid, and ferritin level, iron level is low. -Transfuse for hemoglobin less than 7.    +HCV AB - check HCV RNA  DVT prophylaxis: Heparin Code Status: Full Family Communication: None at bedside Disposition:   Status is: Inpatient  Remains inpatient appropriate because: Bacteremia with need for IV antibiotics       Consultants:  ID CT surgery   Subjective:  Complaints of elbow pain, left foot pain, but no findings on physical exam, no nausea, no vomiting.  Objective: Vitals:   06/30/21 1954 07/01/21 0439 07/01/21 0440 07/01/21 0925  BP: 97/67 110/70 110/70 134/85  Pulse: 77 78 78 97  Resp: 19 19  16  Temp: 98 F (36.7 C) (!) 97.5 F (36.4 C) (!) 97.5 F (36.4 C) 99.1 F (37.3 C)  TempSrc: Oral Oral Oral Oral  SpO2: 100% 99% 99% 100%  Weight:      Height:        Intake/Output Summary (Last 24 hours) at 07/01/2021 1355 Last data filed at 07/01/2021  0900 Gross per 24 hour  Intake 631 ml  Output --  Net 631 ml   Filed Weights   06/29/21 1100  Weight: 60.3 kg    Examination:  Awake Alert, Oriented X 3, No new F.N deficits, Normal affect Symmetrical Chest wall movement, Good air movement bilaterally RRR,No Gallops,Rubs or new Murmurs, No Parasternal Heave +ve B.Sounds, Abd Soft, No tenderness, No rebound - guarding or rigidity. No Cyanosis, Clubbing or edemae        Data Reviewed: I have personally reviewed following labs and imaging studies  CBC: Recent Labs  Lab 06/29/21 0924 06/30/21 0127 07/01/21 0155  WBC 16.9* 16.3* 12.0*  HGB 8.5* 7.6* 7.1*  HCT 27.4* 23.2* 21.8*  MCV 90.1 84.4 84.8  PLT 336 404* 402*    Basic Metabolic Panel: Recent Labs  Lab 06/29/21 0924 06/30/21 0127 07/01/21 0155  NA 131* 130* 129*  K 3.7 3.2* 3.7  CL 103 103 102  CO2 17* 19* 19*  GLUCOSE 98 99 132*  BUN 13 11 13   CREATININE 0.61 0.65 0.66  CALCIUM 7.7* 7.3* 7.4*  MG  --   --  1.9  PHOS  --   --  2.6    GFR: Estimated Creatinine Clearance: 72 mL/min (by C-G formula based on SCr of 0.66 mg/dL).  Liver Function Tests: Recent Labs  Lab 06/29/21 0924  AST 40  ALT 50*  ALKPHOS 167*  BILITOT 1.2  PROT 6.0*  ALBUMIN 1.8*    CBG: No results for input(s): GLUCAP in the last 168 hours.   Recent Results (from the past 240 hour(s))  Blood culture (routine x 2)     Status: Abnormal (Preliminary result)   Collection Time: 06/29/21  9:54 AM   Specimen: BLOOD RIGHT HAND  Result Value Ref Range Status   Specimen Description BLOOD RIGHT HAND  Final   Special Requests   Final    BOTTLES DRAWN AEROBIC ONLY Blood Culture results may not be optimal due to an inadequate volume of blood received in culture bottles   Culture  Setup Time   Final    GRAM POSITIVE COCCI AEROBIC BOTTLE ONLY CRITICAL RESULT CALLED TO, READ BACK BY AND VERIFIED WITH: G,BARR PHARMD @1338  06/30/21 EB    Culture (A)  Final    STAPHYLOCOCCUS  AUREUS SUSCEPTIBILITIES TO FOLLOW Performed at [facility], [address], [place], [place] [address]    Report Status PENDING  Incomplete  Blood Culture ID Panel (Reflexed)     Status: Abnormal   Collection Time: 06/29/21  9:54 AM  Result Value Ref Range Status   Enterococcus faecalis NOT DETECTED NOT DETECTED Final   Enterococcus Faecium NOT DETECTED NOT DETECTED Final   Listeria monocytogenes NOT DETECTED NOT DETECTED Final   Staphylococcus species DETECTED (A) NOT DETECTED Final    Comment: CRITICAL RESULT CALLED TO, READ BACK BY AND VERIFIED WITH: G,BARR PHARMD @1338  06/30/21 EB    Staphylococcus aureus (BCID) DETECTED (A) NOT DETECTED Final    Comment: Methicillin (oxacillin)-resistant Staphylococcus aureus (MRSA). MRSA is predictably resistant to beta-lactam antibiotics (except ceftaroline). Preferred therapy is vancomycin unless clinically contraindicated. Patient requires contact  precautions if  hospitalized. CRITICAL RESULT CALLED TO, READ BACK BY AND VERIFIED WITH: G,BARR PHARMD @1338  06/30/21 EB    Staphylococcus epidermidis NOT DETECTED NOT DETECTED Final   Staphylococcus lugdunensis NOT DETECTED NOT DETECTED Final   Streptococcus species NOT DETECTED NOT DETECTED Final   Streptococcus agalactiae NOT DETECTED NOT DETECTED Final   Streptococcus pneumoniae NOT DETECTED NOT DETECTED Final   Streptococcus pyogenes NOT DETECTED NOT DETECTED Final   A.calcoaceticus-baumannii NOT DETECTED NOT DETECTED Final   Bacteroides fragilis NOT DETECTED NOT DETECTED Final   Enterobacterales NOT DETECTED NOT DETECTED Final   Enterobacter cloacae complex NOT DETECTED NOT DETECTED Final   Escherichia coli NOT DETECTED NOT DETECTED Final   Klebsiella aerogenes NOT DETECTED NOT DETECTED Final   Klebsiella oxytoca NOT DETECTED NOT DETECTED Final   Klebsiella pneumoniae NOT DETECTED NOT DETECTED Final   Proteus species NOT DETECTED NOT DETECTED Final   Salmonella species NOT DETECTED  NOT DETECTED Final   Serratia marcescens NOT DETECTED NOT DETECTED Final   Haemophilus influenzae NOT DETECTED NOT DETECTED Final   Neisseria meningitidis NOT DETECTED NOT DETECTED Final   Pseudomonas aeruginosa NOT DETECTED NOT DETECTED Final   Stenotrophomonas maltophilia NOT DETECTED NOT DETECTED Final   Candida albicans NOT DETECTED NOT DETECTED Final   Candida auris NOT DETECTED NOT DETECTED Final   Candida glabrata NOT DETECTED NOT DETECTED Final   Candida krusei NOT DETECTED NOT DETECTED Final   Candida parapsilosis NOT DETECTED NOT DETECTED Final   Candida tropicalis NOT DETECTED NOT DETECTED Final   Cryptococcus neoformans/gattii NOT DETECTED NOT DETECTED Final   Meth resistant mecA/C and MREJ DETECTED (A) NOT DETECTED Final    Comment: CRITICAL RESULT CALLED TO, READ BACK BY AND VERIFIED WITH: G,BARR PHARMD @1338  06/30/21 EB Performed at [facility], [address] [place], [place] [address]   Resp Panel by RT-PCR (Flu A&B, Covid) Nasopharyngeal Swab     Status: None   Collection Time: 06/29/21  9:55 AM   Specimen: Nasopharyngeal Swab; Nasopharyngeal(NP) swabs in vial transport medium  Result Value Ref Range Status   SARS Coronavirus 2 by RT PCR NEGATIVE NEGATIVE Final    Comment: (NOTE) SARS-CoV-2 target nucleic acids are NOT DETECTED.  The SARS-CoV-2 RNA is generally detectable in upper respiratory specimens during the acute phase of infection. The lowest concentration of SARS-CoV-2 viral copies this assay can detect is 138 copies/mL. A negative result does not preclude SARS-Cov-2 infection and should not be used as the sole basis for treatment or other patient management decisions. A negative result may occur with  improper specimen collection/handling, submission of specimen other than nasopharyngeal swab, presence of viral mutation(s) within the areas targeted by this assay, and inadequate number of viral copies(<138 copies/mL). A negative result must be  combined with clinical observations, patient history, and epidemiological information. The expected result is Negative.  Fact Sheet for Patients:  EntrepreneurPulse.com.au  Fact Sheet for Healthcare Providers:  IncredibleEmployment.be  This test is no t yet approved or cleared by the [place] FDA and  has been authorized for detection and/or diagnosis of SARS-CoV-2 by FDA under an Emergency Use Authorization (EUA). This EUA will remain  in effect (meaning this test can be used) for the duration of the COVID-19 declaration under Section 564(b)(1) of the Act, 21 U.S.C.section 360bbb-3(b)(1), unless the authorization is terminated  or revoked sooner.       Influenza A by PCR NEGATIVE NEGATIVE Final   Influenza B by PCR NEGATIVE NEGATIVE Final  Comment: (NOTE) The Xpert Xpress SARS-CoV-2/FLU/RSV plus assay is intended as an aid in the diagnosis of influenza from Nasopharyngeal swab specimens and should not be used as a sole basis for treatment. Nasal washings and aspirates are unacceptable for Xpert Xpress SARS-CoV-2/FLU/RSV testing.  Fact Sheet for Patients: EntrepreneurPulse.com.au  Fact Sheet for Healthcare Providers: IncredibleEmployment.be  This test is not yet approved or cleared by the [place] FDA and has been authorized for detection and/or diagnosis of SARS-CoV-2 by FDA under an Emergency Use Authorization (EUA). This EUA will remain in effect (meaning this test can be used) for the duration of the COVID-19 declaration under Section 564(b)(1) of the Act, 21 U.S.C. section 360bbb-3(b)(1), unless the authorization is terminated or revoked.  Performed at [facility], [place] [address], [place], [place] [address]   Blood culture (routine x 2)     Status: None (Preliminary result)   Collection Time: 06/29/21  3:15 PM   Specimen: BLOOD  Result Value Ref Range Status   Specimen  Description BLOOD BLOOD LEFT FOREARM  Final   Special Requests   Final    BOTTLES DRAWN AEROBIC AND ANAEROBIC Blood Culture adequate volume   Culture  Setup Time   Final    GRAM POSITIVE COCCI ANAEROBIC BOTTLE ONLY CRITICAL VALUE NOTED.  VALUE IS CONSISTENT WITH PREVIOUSLY REPORTED AND CALLED VALUE.    Culture   Final    NO GROWTH 2 DAYS Performed at [facility], [place] [address] [place], [place] [address]    Report Status PENDING  Incomplete  Culture, blood (routine x 2)     Status: None (Preliminary result)   Collection Time: 06/30/21  6:50 PM   Specimen: BLOOD  Result Value Ref Range Status   Specimen Description BLOOD LEFT ANTECUBITAL  Final   Special Requests   Final    BOTTLES DRAWN AEROBIC ONLY Blood Culture results may not be optimal due to an inadequate volume of blood received in culture bottles   Culture  Setup Time   Final    GRAM POSITIVE COCCI IN CLUSTERS AEROBIC BOTTLE ONLY CRITICAL VALUE NOTED.  VALUE IS CONSISTENT WITH PREVIOUSLY REPORTED AND CALLED VALUE. Performed at [facility], [place] [address] [place], [place] [address]    Culture GRAM POSITIVE COCCI  Final   Report Status PENDING  Incomplete  Culture, blood (routine x 2)     Status: None (Preliminary result)   Collection Time: 06/30/21  6:55 PM   Specimen: BLOOD  Result Value Ref Range Status   Specimen Description BLOOD LEFT ANTECUBITAL  Final   Special Requests   Final    BOTTLES DRAWN AEROBIC ONLY Blood Culture results may not be optimal due to an inadequate volume of blood received in culture bottles   Culture  Setup Time   Final    GRAM POSITIVE COCCI IN CLUSTERS AEROBIC BOTTLE ONLY CRITICAL VALUE NOTED.  VALUE IS CONSISTENT WITH PREVIOUSLY REPORTED AND CALLED VALUE. Performed at [facility], [place] [address] [place], [place] [address]    Culture GRAM POSITIVE COCCI  Final   Report Status PENDING  Incomplete         Radiology Studies: No results  found.      Scheduled Meds:  [START ON 07/02/2021] buprenorphine-naloxone  1.5 tablet Sublingual Daily   cloNIDine  0.1 mg Oral QID   Followed by   cloNIDine  0.1 mg Oral BH-qamhs   Followed by   Derrill Memo ON 07/04/2021] cloNIDine  0.1 mg Oral QAC breakfast   enoxaparin (LOVENOX) injection  40 mg Subcutaneous Q24H   famotidine  20 mg Oral Daily   feeding supplement  237 mL Oral BID BM   magic mouthwash  5 mL Oral QID   nicotine  21 mg Transdermal Daily   valACYclovir  1,000 mg Oral BID   Continuous Infusions:  vancomycin       LOS: 2 days       Phillips Climes, MD [facility]   To contact the attending provider between 7A-7P or the covering provider during after hours 7P-7A, please log into the web site [email or web address] and access using universal [place] password for that web site. If you do not have the password, please call the hospital operator.  07/01/2021, 1:55 PM

## 2021-07-01 NOTE — Consult Note (Addendum)
[address]       [place] [address]             [phone]      Cardiothoracic Surgery Consultation  Reason for Consult: MRSA tricuspid valve endocarditis Referring Physician: Dr. Phil Dopp Gutierrez is an 50 y.o. female.  HPI:   The patient is a 50 year old woman with a history of depression and IV drug abuse who presented to [facility] on 06/13/2021 with left shoulder pain and swelling.  She was found to have an abscess in the left shoulder as well as on the left calf.  She underwent I&D on 06/14/2021 and culture showed MRSA.  She apparently signed out AMA after that surgery but presented again on 06/24/2021 to [facility] with chest pain and shortness of breath and a CT scan showed multiple bilateral septic pulmonary emboli.  2D echocardiogram on 06/25/2021 showed tricuspid valve vegetations measuring 1 cm compatible with endocarditis and repeat blood cultures showed MRSA.  She was started on vancomycin and Maxipime.  She developed acute thrombocytopenia.  DIC was ruled out.  She apparently signed out AMA again on 06/29/2021 because she was not happy with her care and came to [facility] seeking further treatment.  She had repeat positive blood cultures here collected 06/29/2021.  Her main complaint at this time is pain in her mouth and with swallowing.  She denies any pain in her left shoulder or left calf.  She denies any back pain.  She has no hemoptysis.    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   Chicken pox    Depression    Frequent headaches    Manic depression (HCC)    Migraines    Urine incontinence     Past Surgical History:  Procedure Laterality Date   ABDOMINAL HYSTERECTOMY  2014   partial   CESAREAN SECTION  1990   LAPAROSCOPIC REMOVAL ABDOMINAL MASS Right 2002   inner right thigh   partial bladder removal      Family  History  Problem Relation Age of Onset   Cancer Brother        brain, lung, spine   Cancer Maternal Aunt        bone and lung   Cancer Paternal Grandfather        prostate   Arthritis Paternal Grandfather    Hypertension Paternal Grandfather    Arthritis Mother    Mental illness Mother    Arthritis Father    Heart disease Father    Hypertension Father    Arthritis Maternal Grandmother    Diabetes Maternal Grandmother    Arthritis Maternal Grandfather    Arthritis Paternal Grandmother     Social History:  reports that she has been smoking cigarettes. She has been smoking an average of 1 pack per day. She has never used smokeless tobacco. She reports current drug use. She reports that she does not drink alcohol.  Allergies: No Known Allergies  Medications: I have reviewed the patient's current medications. Prior to Admission:  Medications Prior to Admission  Medication Sig Dispense Refill Last Dose   albuterol (PROVENTIL HFA;VENTOLIN HFA) 108 (90 BASE) MCG/ACT inhaler Inhale 2 puffs into the lungs every 6 (six) hours as needed for wheezing or shortness of breath. 1 Inhaler 1 Past Week   budesonide-formoterol (SYMBICORT) 160-4.5 MCG/ACT  inhaler Inhale 2 puffs into the lungs 2 (two) times daily. (Patient not taking: No sig reported) 1 Inhaler 3 Not Taking   metroNIDAZOLE (FLAGYL) 500 MG tablet Take 1 tablet (500 mg total) by mouth 2 (two) times daily. (Patient not taking: No sig reported) 14 tablet 0 Not Taking   sertraline (ZOLOFT) 50 MG tablet Take 1 tablet (50 mg total) by mouth daily. (Patient not taking: No sig reported) 90 tablet 1 Not Taking   Scheduled:  [START ON 07/02/2021] buprenorphine-naloxone  1.5 tablet Sublingual Daily   cloNIDine  0.1 mg Oral QID   Followed by   cloNIDine  0.1 mg Oral BH-qamhs   Followed by   Derrill Memo ON 07/04/2021] cloNIDine  0.1 mg Oral QAC breakfast   enoxaparin (LOVENOX) injection  40 mg Subcutaneous Q24H   famotidine  20 mg Oral Daily    feeding supplement  237 mL Oral BID BM   magic mouthwash  5 mL Oral QID   nicotine  21 mg Transdermal Daily   valACYclovir  1,000 mg Oral BID   Continuous:  vancomycin     UDJ:SHFWYOVZCHYIF **OR** acetaminophen, albuterol, dicyclomine, hydrOXYzine, ketorolac, loperamide, LORazepam **OR** LORazepam, methocarbamol, ondansetron **OR** ondansetron (ZOFRAN) IV Anti-infectives (From admission, onward)    Start     Dose/Rate Route Frequency Ordered Stop   07/01/21 2200  vancomycin (VANCOREADY) IVPB 750 mg/150 mL        750 mg 150 mL/hr over 60 Minutes Intravenous Every 12 hours 07/01/21 1314     07/01/21 1400  valACYclovir (VALTREX) tablet 1,000 mg        1,000 mg Oral 2 times daily 07/01/21 1306 07/08/21 0959   06/30/21 0200  vancomycin (VANCOREADY) IVPB 750 mg/150 mL  Status:  Discontinued        750 mg 150 mL/hr over 60 Minutes Intravenous Every 12 hours 06/29/21 1253 06/29/21 1254   06/29/21 1700  vancomycin (VANCOREADY) IVPB 750 mg/150 mL  Status:  Discontinued        750 mg 150 mL/hr over 60 Minutes Intravenous Every 12 hours 06/29/21 1308 07/01/21 1301   06/29/21 1615  rifampin (RIFADIN) capsule 300 mg  Status:  Discontinued        300 mg Oral Daily 06/29/21 1606 06/29/21 1607   06/29/21 1130  vancomycin (VANCOREADY) IVPB 1250 mg/250 mL  Status:  Discontinued        1,250 mg 166.7 mL/hr over 90 Minutes Intravenous  Once 06/29/21 1124 06/29/21 1254   06/29/21 1130  ceFEPIme (MAXIPIME) 2 g in sodium chloride 0.9 % 100 mL IVPB  Status:  Discontinued        2 g 200 mL/hr over 30 Minutes Intravenous  Once 06/29/21 1124 06/29/21 1251       Results for orders placed or performed during the hospital encounter of 06/29/21 (from the past 48 hour(s))  RPR     Status: None   Collection Time: 06/29/21  1:42 PM  Result Value Ref Range   RPR Ser Ql NON REACTIVE NON REACTIVE    Comment: Performed at [facility], [address], [place],  [address]  Hepatitis panel, acute      Status: Abnormal   Collection Time: 06/29/21  1:42 PM  Result Value Ref Range   Hepatitis B Surface Ag NON REACTIVE NON REACTIVE   HCV Ab Reactive (A) NON REACTIVE    Comment: (NOTE) The CDC recommends that a Reactive HCV antibody result be followed up  with a HCV Nucleic  Acid Amplification test.     Hep A IgM NON REACTIVE NON REACTIVE   Hep B C IgM NON REACTIVE NON REACTIVE    Comment: Performed at [facility], [place] [address] [place], [place] [address]  Brain natriuretic peptide     Status: Abnormal   Collection Time: 06/29/21  2:42 PM  Result Value Ref Range   B Natriuretic Peptide 113.8 (H) 0.0 - 100.0 pg/mL    Comment: Performed at [facility] [address] [place], [place] [address]  Blood culture (routine x 2)     Status: None (Preliminary result)   Collection Time: 06/29/21  3:15 PM   Specimen: BLOOD  Result Value Ref Range   Specimen Description BLOOD BLOOD LEFT FOREARM    Special Requests      BOTTLES DRAWN AEROBIC AND ANAEROBIC Blood Culture adequate volume   Culture  Setup Time      GRAM POSITIVE COCCI ANAEROBIC BOTTLE ONLY CRITICAL VALUE NOTED.  VALUE IS CONSISTENT WITH PREVIOUSLY REPORTED AND CALLED VALUE.    Culture      NO GROWTH 2 DAYS Performed at [facility], [place] [address], [place], [place] [address]    Report Status PENDING   Lactic acid, plasma     Status: None   Collection Time: 06/29/21  3:15 PM  Result Value Ref Range   Lactic Acid, Venous 1.6 0.5 - 1.9 mmol/L    Comment: Performed at [facility], [place] [address], [place], [place] [address]  HIV Antibody (routine testing w rflx)     Status: None   Collection Time: 06/29/21  4:57 PM  Result Value Ref Range   HIV Screen 4th Generation wRfx Non Reactive Non Reactive    Comment: Performed at [facility], [place] [address], [place], [place] [address]  C-reactive protein     Status: Abnormal   Collection Time: 06/29/21  4:57 PM  Result Value Ref Range   CRP 17.6 (H)  <1.0 mg/dL    Comment: Performed at [facility] [address], [place], [place] [address]  Prealbumin     Status: Abnormal   Collection Time: 06/29/21  4:57 PM  Result Value Ref Range   Prealbumin 8.7 (L) 18 - 38 mg/dL    Comment: Performed at [facility] [address], [place], [place] [address]  CBC     Status: Abnormal   Collection Time: 06/30/21  1:27 AM  Result Value Ref Range   WBC 16.3 (H) 4.0 - 10.5 K/uL   RBC 2.75 (L) 3.87 - 5.11 MIL/uL   Hemoglobin 7.6 (L) 12.0 - 15.0 g/dL   HCT 23.2 (L) 36.0 - 46.0 %   MCV 84.4 80.0 - 100.0 fL   MCH 27.6 26.0 - 34.0 pg   MCHC 32.8 30.0 - 36.0 g/dL   RDW 18.0 (H) 11.5 - 15.5 %   Platelets 404 (H) 150 - 400 K/uL   nRBC 0.1 0.0 - 0.2 %    Comment: Performed at [facility] [address] [place],  [address]  Basic metabolic panel     Status: Abnormal   Collection Time: 06/30/21  1:27 AM  Result Value Ref Range   Sodium 130 (L) 135 - 145 mmol/L   Potassium 3.2 (L) 3.5 - 5.1 mmol/L   Chloride 103 98 - 111 mmol/L   CO2 19 (L) 22 - 32 mmol/L   Glucose, Bld 99 70 -  99 mg/dL    Comment: Glucose reference range applies only to samples taken after fasting for at least 8 hours.   BUN 11 6 - 20 mg/dL   Creatinine, Ser 0.65 0.44 - 1.00 mg/dL   Calcium 7.3 (L) 8.9 - 10.3 mg/dL   GFR, Estimated >60 >60 mL/min    Comment: (NOTE) Calculated using the CKD-EPI Creatinine Equation (2021)    Anion gap 8 5 - 15    Comment: Performed at [facility] [address] [place], [place] [address]  Culture, blood (routine x 2)     Status: None (Preliminary result)   Collection Time: 06/30/21  6:50 PM   Specimen: BLOOD  Result Value Ref Range   Specimen Description BLOOD LEFT ANTECUBITAL    Special Requests      BOTTLES DRAWN AEROBIC ONLY Blood Culture results may not be optimal due to an inadequate volume of blood received in culture bottles   Culture  Setup Time      GRAM POSITIVE COCCI IN CLUSTERS AEROBIC BOTTLE  ONLY CRITICAL VALUE NOTED.  VALUE IS CONSISTENT WITH PREVIOUSLY REPORTED AND CALLED VALUE. Performed at [facility], [place] [address] [place], [place] [address]    Culture GRAM POSITIVE COCCI    Report Status PENDING   Vitamin B12     Status: None   Collection Time: 06/30/21  6:51 PM  Result Value Ref Range   Vitamin B-12 590 180 - 914 pg/mL    Comment: (NOTE) This assay is not validated for testing neonatal or myeloproliferative syndrome specimens for Vitamin B12 levels. Performed at [facility], [place] [address] [place], [place] [address]   Folate     Status: None   Collection Time: 06/30/21  6:51 PM  Result Value Ref Range   Folate 11.7 >5.9 ng/mL    Comment: Performed at [facility], [place] [address], [place], [place] [address]  Iron and TIBC     Status: Abnormal   Collection Time: 06/30/21  6:51 PM  Result Value Ref Range   Iron 20 (L) 28 - 170 ug/dL   TIBC 228 (L) 250 - 450 ug/dL   Saturation Ratios 9 (L) 10.4 - 31.8 %   UIBC 208 ug/dL    Comment: Performed at [facility] [address] [place], [place] [address]  Ferritin     Status: None   Collection Time: 06/30/21  6:51 PM  Result Value Ref Range   Ferritin 223 11 - 307 ng/mL    Comment: Performed at [facility], [place] [address], [place], [place] [address]  Reticulocytes     Status: Abnormal   Collection Time: 06/30/21  6:51 PM  Result Value Ref Range   Retic Ct Pct 3.8 (H) 0.4 - 3.1 %   RBC. 2.72 (L) 3.87 - 5.11 MIL/uL   Retic Count, Absolute 102.3 19.0 - 186.0 K/uL   Immature Retic Fract 28.0 (H) 2.3 - 15.9 %    Comment: Performed at [facility] [address], [place], [place] [address]  Culture, blood (routine x 2)     Status: None (Preliminary result)   Collection Time: 06/30/21  6:55 PM   Specimen: BLOOD  Result Value Ref Range   Specimen Description BLOOD LEFT ANTECUBITAL    Special Requests      BOTTLES DRAWN AEROBIC ONLY Blood Culture results may not be optimal  due to an inadequate volume of blood received in culture  bottles   Culture  Setup Time      GRAM POSITIVE COCCI IN CLUSTERS AEROBIC BOTTLE ONLY CRITICAL VALUE NOTED.  VALUE IS CONSISTENT WITH PREVIOUSLY REPORTED AND CALLED VALUE. Performed at [facility], [place] [address], [place], [place] [address]    Culture GRAM POSITIVE COCCI    Report Status PENDING   CBC     Status: Abnormal   Collection Time: 07/01/21  1:55 AM  Result Value Ref Range   WBC 12.0 (H) 4.0 - 10.5 K/uL   RBC 2.57 (L) 3.87 - 5.11 MIL/uL   Hemoglobin 7.1 (L) 12.0 - 15.0 g/dL   HCT 21.8 (L) 36.0 - 46.0 %   MCV 84.8 80.0 - 100.0 fL   MCH 27.6 26.0 - 34.0 pg   MCHC 32.6 30.0 - 36.0 g/dL   RDW 18.5 (H) 11.5 - 15.5 %   Platelets 402 (H) 150 - 400 K/uL   nRBC 0.0 0.0 - 0.2 %    Comment: Performed at [facility], [place] [address] [place], [place] [address]  Basic metabolic panel     Status: Abnormal   Collection Time: 07/01/21  1:55 AM  Result Value Ref Range   Sodium 129 (L) 135 - 145 mmol/L   Potassium 3.7 3.5 - 5.1 mmol/L   Chloride 102 98 - 111 mmol/L   CO2 19 (L) 22 - 32 mmol/L   Glucose, Bld 132 (H) 70 - 99 mg/dL    Comment: Glucose reference range applies only to samples taken after fasting for at least 8 hours.   BUN 13 6 - 20 mg/dL   Creatinine, Ser 0.66 0.44 - 1.00 mg/dL   Calcium 7.4 (L) 8.9 - 10.3 mg/dL   GFR, Estimated >60 >60 mL/min    Comment: (NOTE) Calculated using the CKD-EPI Creatinine Equation (2021)    Anion gap 8 5 - 15    Comment: Performed at [facility] [address] [place], [place] [address]  Magnesium     Status: None   Collection Time: 07/01/21  1:55 AM  Result Value Ref Range   Magnesium 1.9 1.7 - 2.4 mg/dL    Comment: Performed at [facility] [address], [place], [place] [address]  Phosphorus     Status: None   Collection Time: 07/01/21  1:55 AM  Result Value Ref Range   Phosphorus 2.6 2.5 - 4.6 mg/dL    Comment: Performed at [facility] [address] [place], [place] [address]    No results found.  Review of Systems  Constitutional:  Positive for chills, diaphoresis, fatigue and fever. Negative for activity change and appetite change.  HENT:         Has upper dentures and a few teeth left on the bottom  Eyes: Negative.   Respiratory:  Positive for cough. Negative for shortness of breath.   Cardiovascular:  Negative for chest pain, palpitations and leg swelling.  Gastrointestinal: Negative.   Endocrine: Negative.   Genitourinary: Negative.   Musculoskeletal:  Negative for arthralgias, back pain and joint swelling.  Skin: Negative.   Allergic/Immunologic: Negative.   Neurological:  Negative for dizziness, syncope and headaches.  Hematological: Negative.   Psychiatric/Behavioral:         Depression and substance abuse  Blood pressure 134/85, pulse 97, temperature 99.1 F (37.3 C), temperature source Oral, resp. rate 16, height 5\' 2"  (1.575 m), weight 60.3 kg, SpO2 100 %. Physical Exam Constitutional:  Comments: Thin and frail looking  HENT:     Head: Normocephalic and atraumatic.     Mouth/Throat:     Mouth: Mucous membranes are dry.     Comments: No oral lesions seen Eyes:     Extraocular Movements: Extraocular movements intact.     Conjunctiva/sclera: Conjunctivae normal.     Pupils: Pupils are equal, round, and reactive to light.  Neck:     Vascular: No JVD.  Cardiovascular:     Rate and Rhythm: Normal rate and regular rhythm.     Pulses: Normal pulses.     Heart sounds: Normal heart sounds. No murmur heard. Pulmonary:     Effort: Pulmonary effort is normal.     Breath sounds: Wheezing present.  Abdominal:     General: Abdomen is flat. Bowel sounds are normal. There is no distension.     Palpations: Abdomen is soft.     Tenderness: There is no abdominal tenderness.  Musculoskeletal:        General: No swelling or tenderness. Normal range of motion.     Cervical back: Normal range of  motion and neck supple.     Comments: Healing wound left lateral calf and left shoulder  Skin:    General: Skin is warm and dry.     Comments: Multiple scabs on and from IV drug injection  Neurological:     General: No focal deficit present.     Mental Status: She is alert and oriented to person, place, and time.  Psychiatric:        Mood and Affect: Mood normal.   Diagnostic Tests:  ECG shows sinus rhythm with no heart block  CXR: bilateral diffuse septic pulmonary emboli.  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  Her echocardiogram showed tricuspid valve vegetations that were approximately 1 cm in maximum dimension with mild TR.  Repeat blood cultures at [facility] and at [facility] were positive for MRSA.  She had a temperature of 101 yesterday afternoon but her T-max today has been 99.1 so far.  White blood cell count on admission here was 16.9 and has decreased to 12.0 today.  I do not think there is any indication for surgical treatment at this time but she should be continued on intravenous antibiotics with follow-up cultures and a TEE performed.  I discussed tricuspid valve endocarditis with her and the general indications for surgical treatment.  I will follow-up on her TEE and blood cultures and make further recommendations as indicated.  Fernande Boyden Cristen Murcia 07/01/2021, 1:12 PM

## 2021-07-01 NOTE — Progress Notes (Signed)
[facility]  Date of Admission:  06/29/2021           Reason for visit: Follow up on MRSA bacteremia  Current antibiotics: Vanco  ASSESSMENT:    50 y.o. female admitted with:  MRSA TV endocarditis Recent left shoulder and left calf abscess s/p I&D (06/14/21 at [facility]) Septic pulmonary emboli Oral ulcers, odynophagia Rash, suspected vasculitis due to #1 Opiate use disorder HCV Ab positive  RECOMMENDATIONS:    Continue vancomycin TEE Appreciate CT surgery evaluation Repeat blood cultures again given continued positivity from yesterday HCV RNA pending Will start magic mouth wash and valtrex 1gm BID for possible oral HSV lesions Continue to monitor for other metastatic foci of infection Will follow   Principal Problem:   MRSA bacteremia Active Problems:   Severe opioid use disorder (HCC)   Endocarditis   Septic pulmonary embolism (HCC)   Chronic viral hepatitis C (HCC)    MEDICATIONS:    Scheduled Meds:  buprenorphine-naloxone  1 tablet Sublingual Daily   cloNIDine  0.1 mg Oral QID   Followed by   cloNIDine  0.1 mg Oral BH-qamhs   Followed by   Derrill Memo ON 07/04/2021] cloNIDine  0.1 mg Oral QAC breakfast   enoxaparin (LOVENOX) injection  40 mg Subcutaneous Q24H   famotidine  20 mg Oral Daily   feeding supplement  237 mL Oral BID BM   magic mouthwash  5 mL Oral QID   nicotine  21 mg Transdermal Daily   Continuous Infusions: PRN Meds:.acetaminophen **OR** acetaminophen, albuterol, dicyclomine, hydrOXYzine, ketorolac, loperamide, LORazepam **OR** LORazepam, methocarbamol, ondansetron **OR** ondansetron (ZOFRAN) IV  SUBJECTIVE:   She continues to not feel well.  Her blood cx from yesterday are positive again.  She reports oral ulcers that are painful and she also has some pain when swallowing.  Her right elbow has some tenderness superficially where she places pressure when sitting up.  No pain to palpation and no pain with  range of motion.  Review of Systems  All other systems reviewed and are negative.    OBJECTIVE:   Blood pressure 134/85, pulse 97, temperature 99.1 F (37.3 C), temperature source Oral, resp. rate 16, height 5\' 2"  (1.575 m), weight 60.3 kg, SpO2 100 %. Body mass index is 24.31 kg/m.  Physical Exam Constitutional:      General: She is not in acute distress.    Comments: Chronically ill appearing woman, sitting in hospital bed.  HENT:     Head: Normocephalic and atraumatic.     Mouth/Throat:     Comments: No obvious thrush noted in her mouth. Superficial ulcers noted on lips Eyes:     Extraocular Movements: Extraocular movements intact.     Conjunctiva/sclera: Conjunctivae normal.  Pulmonary:     Effort: Pulmonary effort is normal. No respiratory distress.  Abdominal:     General: There is no distension.     Palpations: Abdomen is soft.     Tenderness: There is no abdominal tenderness.  Musculoskeletal:     Comments: Right elbow ROM without pain and no tenderness with palpation. Left shoulder incision healing. Rash improved.   Skin:    General: Skin is warm and dry.  Neurological:     General: No focal deficit present.     Mental Status: She is alert and oriented to person, place, and time.  Psychiatric:        Mood and Affect: Mood normal.        Behavior:  Behavior normal.     Lab Results: Lab Results  Component Value Date   WBC 12.0 (H) 07/01/2021   HGB 7.1 (L) 07/01/2021   HCT 21.8 (L) 07/01/2021   MCV 84.8 07/01/2021   PLT 402 (H) 07/01/2021    Lab Results  Component Value Date   NA 129 (L) 07/01/2021   K 3.7 07/01/2021   CO2 19 (L) 07/01/2021   GLUCOSE 132 (H) 07/01/2021   BUN 13 07/01/2021   CREATININE 0.66 07/01/2021   CALCIUM 7.4 (L) 07/01/2021   GFRNONAA >60 07/01/2021   GFRAA >60 06/28/2015    Lab Results  Component Value Date   ALT 50 (H) 06/29/2021   AST 40 06/29/2021   ALKPHOS 167 (H) 06/29/2021   BILITOT 1.2 06/29/2021        Component Value Date/Time   CRP 17.6 (H) 06/29/2021 1657    No results found for: ESRSEDRATE   I have reviewed the micro and lab results in Epic.  Imaging: No results found.   Imaging independently reviewed in Epic.    [facility] [phone] pager 07/01/2021, 1:02 PM  I spent greater than 35 minutes with the patient including greater than 50% of time in face to face counsel of the patient and in coordination of their care.

## 2021-07-01 NOTE — Plan of Care (Signed)

## 2021-07-02 DIAGNOSIS — R1310 Dysphagia, unspecified: Secondary | ICD-10-CM

## 2021-07-02 DIAGNOSIS — B9562 Methicillin resistant Staphylococcus aureus infection as the cause of diseases classified elsewhere: Secondary | ICD-10-CM | POA: Diagnosis not present

## 2021-07-02 DIAGNOSIS — I330 Acute and subacute infective endocarditis: Secondary | ICD-10-CM | POA: Diagnosis not present

## 2021-07-02 DIAGNOSIS — L02419 Cutaneous abscess of limb, unspecified: Secondary | ICD-10-CM

## 2021-07-02 DIAGNOSIS — R7881 Bacteremia: Secondary | ICD-10-CM | POA: Diagnosis not present

## 2021-07-02 DIAGNOSIS — B182 Chronic viral hepatitis C: Secondary | ICD-10-CM | POA: Diagnosis not present

## 2021-07-02 DIAGNOSIS — I2690 Septic pulmonary embolism without acute cor pulmonale: Secondary | ICD-10-CM | POA: Diagnosis not present

## 2021-07-02 LAB — CBC
HCT: 25.3 % — ABNORMAL LOW (ref 36.0–46.0)
Hemoglobin: 8.1 g/dL — ABNORMAL LOW (ref 12.0–15.0)
MCH: 27.9 pg (ref 26.0–34.0)
MCHC: 32 g/dL (ref 30.0–36.0)
MCV: 87.2 fL (ref 80.0–100.0)
Platelets: 502 10*3/uL — ABNORMAL HIGH (ref 150–400)
RBC: 2.9 MIL/uL — ABNORMAL LOW (ref 3.87–5.11)
RDW: 19.6 % — ABNORMAL HIGH (ref 11.5–15.5)
WBC: 9.2 10*3/uL (ref 4.0–10.5)
nRBC: 0 % (ref 0.0–0.2)

## 2021-07-02 LAB — CULTURE, BLOOD (ROUTINE X 2)

## 2021-07-02 LAB — BASIC METABOLIC PANEL
Anion gap: 8 (ref 5–15)
BUN: 14 mg/dL (ref 6–20)
CO2: 19 mmol/L — ABNORMAL LOW (ref 22–32)
Calcium: 7.4 mg/dL — ABNORMAL LOW (ref 8.9–10.3)
Chloride: 105 mmol/L (ref 98–111)
Creatinine, Ser: 0.59 mg/dL (ref 0.44–1.00)
GFR, Estimated: >60 mL/min (ref >60)
Glucose, Bld: 98 mg/dL (ref 70–99)
Potassium: 4 mmol/L (ref 3.5–5.1)
Sodium: 132 mmol/L — ABNORMAL LOW (ref 135–145)

## 2021-07-02 LAB — VANCOMYCIN, PEAK: Vancomycin Pk: 23 ug/mL — ABNORMAL LOW (ref 30–40)

## 2021-07-02 LAB — VANCOMYCIN, TROUGH: Vancomycin Tr: 11 ug/mL — ABNORMAL LOW (ref 15–20)

## 2021-07-02 LAB — PHOSPHORUS: Phosphorus: 4 mg/dL (ref 2.5–4.6)

## 2021-07-02 MED ORDER — INFLUENZA VAC SPLIT QUAD 0.5 ML IM SUSY: 0.5000 mL | PREFILLED_SYRINGE | INTRAMUSCULAR | Status: DC | PRN

## 2021-07-02 MED ORDER — ALPRAZOLAM 0.25 MG PO TABS
0.2500 mg | ORAL_TABLET | Freq: Three times a day (TID) | ORAL | Status: DC | PRN
  Administered 2021-07-02 – 2021-08-18 (×84): 0.25 mg via ORAL
  Filled 2021-07-02 (×87): qty 1

## 2021-07-02 NOTE — Progress Notes (Signed)
Pharmacy Antibiotic Note  Yvette Gaines is a 50 y.o. female admitted on 06/29/2021 with  MRSA bacteremia .  Pharmacy has been consulted for vancomycin dosing.   Repeat vancomycin levels collected overnight were as follows: Vanc peak 23 Vanc trough 11 AUC 451 (therapeutic)   WBC normalized. SCr wnl   Plan:  -Continue vancomycin 750mg  IV q12h with therapeutic AUC of 451  -Monitor renal function, clinical status, and antibiotic plan -F/u TEE    Height: 5\' 2"  (157.5 cm) Weight: 60.3 kg (132 lb 15 oz) IBW/kg (Calculated) : 50.1  Temp (24hrs), Avg:99.2 F (37.3 C), Min:98.2 F (36.8 C), Max:100.5 F (38.1 C)  Recent Labs  Lab 06/29/21 0924 06/29/21 1123 06/29/21 1515 06/30/21 0127 07/01/21 0155 07/02/21 0101 07/02/21 0850  WBC 16.9*  --   --  16.3* 12.0* 9.2  --   CREATININE 0.61  --   --  0.65 0.66 0.59  --   LATICACIDVEN  --  1.1 1.6  --   --   --   --   VANCOTROUGH  --   --   --   --   --   --  11*  VANCOPEAK  --   --   --   --   --  23*  --      Estimated Creatinine Clearance: 72 mL/min (by C-G formula based on SCr of 0.59 mg/dL).    Antimicrobials this admission: Vanc started @ OSH  unknown start date  >>   Dose adjustments this admission: 10/24: AUC 451 on vancomycin 750 mg IV Q 12 hours. Continue   Microbiology results: 10/21 BCx: MRSA 3/3 10/22 BCx >> MRSA 2/2 10/24 BCx >> sent  MRSA+ at OSH  Thank you for allowing pharmacy to be a part of this patient's care.  Albertina Parr, PharmD., BCPS, BCCCP Clinical Pharmacist Please refer to [facility] for unit-specific pharmacist

## 2021-07-02 NOTE — H&P (View-Only) (Signed)
Subjective: Hurting all over   Antibiotics:  Anti-infectives (From admission, onward)    Start     Dose/Rate Route Frequency Ordered Stop   07/01/21 2200  vancomycin (VANCOREADY) IVPB 750 mg/150 mL        750 mg 150 mL/hr over 60 Minutes Intravenous Every 12 hours 07/01/21 1314     07/01/21 1400  valACYclovir (VALTREX) tablet 1,000 mg        1,000 mg Oral 2 times daily 07/01/21 1306 07/08/21 0959   06/30/21 0200  vancomycin (VANCOREADY) IVPB 750 mg/150 mL  Status:  Discontinued        750 mg 150 mL/hr over 60 Minutes Intravenous Every 12 hours 06/29/21 1253 06/29/21 1254   06/29/21 1700  vancomycin (VANCOREADY) IVPB 750 mg/150 mL  Status:  Discontinued        750 mg 150 mL/hr over 60 Minutes Intravenous Every 12 hours 06/29/21 1308 07/01/21 1301   06/29/21 1615  rifampin (RIFADIN) capsule 300 mg  Status:  Discontinued        300 mg Oral Daily 06/29/21 1606 06/29/21 1607   06/29/21 1130  vancomycin (VANCOREADY) IVPB 1250 mg/250 mL  Status:  Discontinued        1,250 mg 166.7 mL/hr over 90 Minutes Intravenous  Once 06/29/21 1124 06/29/21 1254   06/29/21 1130  ceFEPIme (MAXIPIME) 2 g in sodium chloride 0.9 % 100 mL IVPB  Status:  Discontinued        2 g 200 mL/hr over 30 Minutes Intravenous  Once 06/29/21 1124 06/29/21 1251       Medications: Scheduled Meds:  buprenorphine-naloxone  1.5 tablet Sublingual Daily   cloNIDine  0.1 mg Oral BH-qamhs   Followed by   Derrill Memo ON 07/04/2021] cloNIDine  0.1 mg Oral QAC breakfast   enoxaparin (LOVENOX) injection  40 mg Subcutaneous Q24H   famotidine  20 mg Oral Daily   feeding supplement  237 mL Oral BID BM   magic mouthwash  5 mL Oral QID   nicotine  21 mg Transdermal Daily   valACYclovir  1,000 mg Oral BID   Continuous Infusions:  vancomycin 750 mg (07/02/21 1018)   PRN Meds:.acetaminophen **OR** acetaminophen, albuterol, ALPRAZolam, dicyclomine, hydrOXYzine, influenza vac split quadrivalent PF, ketorolac, loperamide,  methocarbamol, ondansetron **OR** ondansetron (ZOFRAN) IV    Objective: Weight change:   Intake/Output Summary (Last 24 hours) at 07/02/2021 1721 Last data filed at 07/02/2021 1528 Gross per 24 hour  Intake 300 ml  Output --  Net 300 ml   Blood pressure 116/82, pulse 98, temperature (!) 101.2 F (38.4 C), temperature source Oral, resp. rate 16, height 5\' 2"  (1.575 m), weight 60.3 kg, SpO2 100 %. Temp:  [98.6 F (37 C)-101.2 F (38.4 C)] 101.2 F (38.4 C) (10/24 1701) Pulse Rate:  [97-101] 98 (10/24 1701) Resp:  [16-18] 16 (10/24 1701) BP: (116-139)/(80-84) 116/82 (10/24 1701) SpO2:  [98 %-100 %] 100 % (10/24 1701)  Physical Exam: Physical Exam Vitals reviewed.  Constitutional:      General: She is not in acute distress.    Appearance: She is ill-appearing. She is not diaphoretic.  HENT:     Head: Normocephalic and atraumatic.     Comments: Missing several front teeth    Right Ear: External ear normal.     Left Ear: External ear normal.     Mouth/Throat:     Pharynx: No oropharyngeal exudate.  Eyes:     General: No scleral icterus.  Conjunctiva/sclera: Conjunctivae normal.     Pupils: Pupils are equal, round, and reactive to light.  Cardiovascular:     Rate and Rhythm: Regular rhythm. Tachycardia present.     Heart sounds: Normal heart sounds. No murmur heard.   No friction rub. No gallop.  Pulmonary:     Effort: Pulmonary effort is normal. No respiratory distress.     Breath sounds: Normal breath sounds. No stridor. No wheezing, rhonchi or rales.  Abdominal:     General: Bowel sounds are normal. There is no distension.     Palpations: Abdomen is soft.     Tenderness: There is no abdominal tenderness. There is no rebound.  Musculoskeletal:        General: No tenderness. Normal range of motion.  Lymphadenopathy:     Cervical: No cervical adenopathy.  Skin:    General: Skin is warm and dry.     Coloration: Skin is not pale.     Findings: No erythema or rash.   Neurological:     General: No focal deficit present.     Mental Status: She is alert and oriented to person, place, and time.     Motor: No abnormal muscle tone.     Coordination: Coordination normal.  Psychiatric:        Mood and Affect: Mood normal.        Behavior: Behavior normal.        Thought Content: Thought content normal.        Judgment: Judgment normal.     CBC:    BMET Recent Labs    07/01/21 0155 07/02/21 0101  NA 129* 132*  K 3.7 4.0  CL 102 105  CO2 19* 19*  GLUCOSE 132* 98  BUN 13 14  CREATININE 0.66 0.59  CALCIUM 7.4* 7.4*     Liver Panel  No results for input(s): PROT, ALBUMIN, AST, ALT, ALKPHOS, BILITOT, BILIDIR, IBILI in the last 72 hours.     Sedimentation Rate Recent Labs    06/30/21 0127  ESRSEDRATE 35*   C-Reactive Protein No results for input(s): CRP in the last 72 hours.  Micro Results: Recent Results (from the past 720 hour(s))  Blood culture (routine x 2)     Status: Abnormal   Collection Time: 06/29/21  9:54 AM   Specimen: BLOOD RIGHT HAND  Result Value Ref Range Status   Specimen Description BLOOD RIGHT HAND  Final   Special Requests   Final    BOTTLES DRAWN AEROBIC ONLY Blood Culture results may not be optimal due to an inadequate volume of blood received in culture bottles   Culture  Setup Time   Final    GRAM POSITIVE COCCI AEROBIC BOTTLE ONLY CRITICAL RESULT CALLED TO, READ BACK BY AND VERIFIED WITH: G,BARR PHARMD @1338  06/30/21 EB Performed at [facility], [place] [address] [place],  [address]    Culture METHICILLIN RESISTANT STAPHYLOCOCCUS AUREUS (A)  Final   Report Status 07/02/2021 FINAL  Final   Organism ID, Bacteria METHICILLIN RESISTANT STAPHYLOCOCCUS AUREUS  Final      Susceptibility   Methicillin resistant staphylococcus aureus - MIC*    CIPROFLOXACIN <=0.5 SENSITIVE Sensitive     ERYTHROMYCIN >=8 RESISTANT Resistant     GENTAMICIN <=0.5 SENSITIVE Sensitive     OXACILLIN >=4 RESISTANT  Resistant     TETRACYCLINE <=1 SENSITIVE Sensitive     VANCOMYCIN 1 SENSITIVE Sensitive     TRIMETH/SULFA <=10 SENSITIVE Sensitive     CLINDAMYCIN <=0.25 SENSITIVE  Sensitive     RIFAMPIN <=0.5 SENSITIVE Sensitive     Inducible Clindamycin NEGATIVE Sensitive     * METHICILLIN RESISTANT STAPHYLOCOCCUS AUREUS  Blood Culture ID Panel (Reflexed)     Status: Abnormal   Collection Time: 06/29/21  9:54 AM  Result Value Ref Range Status   Enterococcus faecalis NOT DETECTED NOT DETECTED Final   Enterococcus Faecium NOT DETECTED NOT DETECTED Final   Listeria monocytogenes NOT DETECTED NOT DETECTED Final   Staphylococcus species DETECTED (A) NOT DETECTED Final    Comment: CRITICAL RESULT CALLED TO, READ BACK BY AND VERIFIED WITH: G,BARR PHARMD @1338  06/30/21 EB    Staphylococcus aureus (BCID) DETECTED (A) NOT DETECTED Final    Comment: Methicillin (oxacillin)-resistant Staphylococcus aureus (MRSA). MRSA is predictably resistant to beta-lactam antibiotics (except ceftaroline). Preferred therapy is vancomycin unless clinically contraindicated. Patient requires contact precautions if  hospitalized. CRITICAL RESULT CALLED TO, READ BACK BY AND VERIFIED WITH: G,BARR PHARMD @1338  06/30/21 EB    Staphylococcus epidermidis NOT DETECTED NOT DETECTED Final   Staphylococcus lugdunensis NOT DETECTED NOT DETECTED Final   Streptococcus species NOT DETECTED NOT DETECTED Final   Streptococcus agalactiae NOT DETECTED NOT DETECTED Final   Streptococcus pneumoniae NOT DETECTED NOT DETECTED Final   Streptococcus pyogenes NOT DETECTED NOT DETECTED Final   A.calcoaceticus-baumannii NOT DETECTED NOT DETECTED Final   Bacteroides fragilis NOT DETECTED NOT DETECTED Final   Enterobacterales NOT DETECTED NOT DETECTED Final   Enterobacter cloacae complex NOT DETECTED NOT DETECTED Final   Escherichia coli NOT DETECTED NOT DETECTED Final   Klebsiella aerogenes NOT DETECTED NOT DETECTED Final   Klebsiella oxytoca NOT  DETECTED NOT DETECTED Final   Klebsiella pneumoniae NOT DETECTED NOT DETECTED Final   Proteus species NOT DETECTED NOT DETECTED Final   Salmonella species NOT DETECTED NOT DETECTED Final   Serratia marcescens NOT DETECTED NOT DETECTED Final   Haemophilus influenzae NOT DETECTED NOT DETECTED Final   Neisseria meningitidis NOT DETECTED NOT DETECTED Final   Pseudomonas aeruginosa NOT DETECTED NOT DETECTED Final   Stenotrophomonas maltophilia NOT DETECTED NOT DETECTED Final   Candida albicans NOT DETECTED NOT DETECTED Final   Candida auris NOT DETECTED NOT DETECTED Final   Candida glabrata NOT DETECTED NOT DETECTED Final   Candida krusei NOT DETECTED NOT DETECTED Final   Candida parapsilosis NOT DETECTED NOT DETECTED Final   Candida tropicalis NOT DETECTED NOT DETECTED Final   Cryptococcus neoformans/gattii NOT DETECTED NOT DETECTED Final   Meth resistant mecA/C and MREJ DETECTED (A) NOT DETECTED Final    Comment: CRITICAL RESULT CALLED TO, READ BACK BY AND VERIFIED WITH: G,BARR PHARMD @1338  06/30/21 EB Performed at [facility], [address], [place], [place] [address]   Resp Panel by RT-PCR (Flu A&B, Covid) Nasopharyngeal Swab     Status: None   Collection Time: 06/29/21  9:55 AM   Specimen: Nasopharyngeal Swab; Nasopharyngeal(NP) swabs in vial transport medium  Result Value Ref Range Status   SARS Coronavirus 2 by RT PCR NEGATIVE NEGATIVE Final    Comment: (NOTE) SARS-CoV-2 target nucleic acids are NOT DETECTED.  The SARS-CoV-2 RNA is generally detectable in upper respiratory specimens during the acute phase of infection. The lowest concentration of SARS-CoV-2 viral copies this assay can detect is 138 copies/mL. A negative result does not preclude SARS-Cov-2 infection and should not be used as the sole basis for treatment or other patient management decisions. A negative result may occur with  improper specimen collection/handling, submission of specimen other than  nasopharyngeal swab,  presence of viral mutation(s) within the areas targeted by this assay, and inadequate number of viral copies(<138 copies/mL). A negative result must be combined with clinical observations, patient history, and epidemiological information. The expected result is Negative.  Fact Sheet for Patients:  EntrepreneurPulse.com.au  Fact Sheet for Healthcare Providers:  IncredibleEmployment.be  This test is no t yet approved or cleared by the [place] FDA and  has been authorized for detection and/or diagnosis of SARS-CoV-2 by FDA under an Emergency Use Authorization (EUA). This EUA will remain  in effect (meaning this test can be used) for the duration of the COVID-19 declaration under Section 564(b)(1) of the Act, 21 U.S.C.section 360bbb-3(b)(1), unless the authorization is terminated  or revoked sooner.       Influenza A by PCR NEGATIVE NEGATIVE Final   Influenza B by PCR NEGATIVE NEGATIVE Final    Comment: (NOTE) The Xpert Xpress SARS-CoV-2/FLU/RSV plus assay is intended as an aid in the diagnosis of influenza from Nasopharyngeal swab specimens and should not be used as a sole basis for treatment. Nasal washings and aspirates are unacceptable for Xpert Xpress SARS-CoV-2/FLU/RSV testing.  Fact Sheet for Patients: EntrepreneurPulse.com.au  Fact Sheet for Healthcare Providers: IncredibleEmployment.be  This test is not yet approved or cleared by the [place] FDA and has been authorized for detection and/or diagnosis of SARS-CoV-2 by FDA under an Emergency Use Authorization (EUA). This EUA will remain in effect (meaning this test can be used) for the duration of the COVID-19 declaration under Section 564(b)(1) of the Act, 21 U.S.C. section 360bbb-3(b)(1), unless the authorization is terminated or revoked.  Performed at [facility], [place] [address], [place], [place] [address]    Blood culture (routine x 2)     Status: Abnormal (Preliminary result)   Collection Time: 06/29/21  3:15 PM   Specimen: BLOOD  Result Value Ref Range Status   Specimen Description BLOOD BLOOD LEFT FOREARM  Final   Special Requests   Final    BOTTLES DRAWN AEROBIC AND ANAEROBIC Blood Culture adequate volume   Culture  Setup Time   Final    GRAM POSITIVE COCCI ANAEROBIC BOTTLE ONLY CRITICAL VALUE NOTED.  VALUE IS CONSISTENT WITH PREVIOUSLY REPORTED AND CALLED VALUE.    Culture (A)  Final    STAPHYLOCOCCUS AUREUS SUSCEPTIBILITIES PERFORMED ON PREVIOUS CULTURE WITHIN THE LAST 5 DAYS. Performed at [facility], [place] [address] [place], [place] [address]    Report Status PENDING  Incomplete  Culture, blood (routine x 2)     Status: Abnormal   Collection Time: 06/30/21  6:50 PM   Specimen: BLOOD  Result Value Ref Range Status   Specimen Description BLOOD LEFT ANTECUBITAL  Final   Special Requests   Final    BOTTLES DRAWN AEROBIC ONLY Blood Culture results may not be optimal due to an inadequate volume of blood received in culture bottles   Culture  Setup Time   Final    GRAM POSITIVE COCCI IN CLUSTERS AEROBIC BOTTLE ONLY CRITICAL VALUE NOTED.  VALUE IS CONSISTENT WITH PREVIOUSLY REPORTED AND CALLED VALUE.    Culture (A)  Final    STAPHYLOCOCCUS AUREUS SUSCEPTIBILITIES PERFORMED ON PREVIOUS CULTURE WITHIN THE LAST 5 DAYS. Performed at [facility], [place] [address], [place], [place] [address]    Report Status 07/02/2021 FINAL  Final  Culture, blood (routine x 2)     Status: Abnormal   Collection Time: 06/30/21  6:55 PM   Specimen: BLOOD  Result Value  Ref Range Status   Specimen Description BLOOD LEFT ANTECUBITAL  Final   Special Requests   Final    BOTTLES DRAWN AEROBIC ONLY Blood Culture results may not be optimal due to an inadequate volume of blood received in culture bottles   Culture  Setup Time   Final    GRAM POSITIVE COCCI IN CLUSTERS AEROBIC BOTTLE  ONLY CRITICAL VALUE NOTED.  VALUE IS CONSISTENT WITH PREVIOUSLY REPORTED AND CALLED VALUE.    Culture (A)  Final    STAPHYLOCOCCUS AUREUS SUSCEPTIBILITIES PERFORMED ON PREVIOUS CULTURE WITHIN THE LAST 5 DAYS. Performed at [facility], [place] [address] [place], [place] [address]    Report Status 07/02/2021 FINAL  Final    Studies/Results: No results found.    Assessment/Plan:  INTERVAL HISTORY:    Principal Problem:   MRSA bacteremia Active Problems:   Severe opioid use disorder ([place])   Endocarditis   Septic pulmonary embolism ([facility])   Chronic viral hepatitis C ([place])    Yvette Gaines is a 50 y.o. 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 Will ensure she is immune to hep b and A. WOuld consider PrEP against HIV.   LOS: 3 days   Alcide Evener 07/02/2021, 5:21 PM

## 2021-07-02 NOTE — Social Work (Signed)
CSW spoke to pt about substance use. Pt reports she has been on suboxone off and on for a few years. Pt reports [facility] to [facility] [place]. Pt denies currently using any other substances or alcohol. Pt plans on returning to [facility] after DC from the hospital. Pt declined resources.   Emeterio Reeve, LCSW Clinical Social Worker

## 2021-07-02 NOTE — Progress Notes (Signed)
Subjective: Hurting all over   Antibiotics:  Anti-infectives (From admission, onward)    Start     Dose/Rate Route Frequency Ordered Stop   07/01/21 2200  vancomycin (VANCOREADY) IVPB 750 mg/150 mL        750 mg 150 mL/hr over 60 Minutes Intravenous Every 12 hours 07/01/21 1314     07/01/21 1400  valACYclovir (VALTREX) tablet 1,000 mg        1,000 mg Oral 2 times daily 07/01/21 1306 07/08/21 0959   06/30/21 0200  vancomycin (VANCOREADY) IVPB 750 mg/150 mL  Status:  Discontinued        750 mg 150 mL/hr over 60 Minutes Intravenous Every 12 hours 06/29/21 1253 06/29/21 1254   06/29/21 1700  vancomycin (VANCOREADY) IVPB 750 mg/150 mL  Status:  Discontinued        750 mg 150 mL/hr over 60 Minutes Intravenous Every 12 hours 06/29/21 1308 07/01/21 1301   06/29/21 1615  rifampin (RIFADIN) capsule 300 mg  Status:  Discontinued        300 mg Oral Daily 06/29/21 1606 06/29/21 1607   06/29/21 1130  vancomycin (VANCOREADY) IVPB 1250 mg/250 mL  Status:  Discontinued        1,250 mg 166.7 mL/hr over 90 Minutes Intravenous  Once 06/29/21 1124 06/29/21 1254   06/29/21 1130  ceFEPIme (MAXIPIME) 2 g in sodium chloride 0.9 % 100 mL IVPB  Status:  Discontinued        2 g 200 mL/hr over 30 Minutes Intravenous  Once 06/29/21 1124 06/29/21 1251       Medications: Scheduled Meds:  buprenorphine-naloxone  1.5 tablet Sublingual Daily   cloNIDine  0.1 mg Oral BH-qamhs   Followed by   Derrill Memo ON 07/04/2021] cloNIDine  0.1 mg Oral QAC breakfast   enoxaparin (LOVENOX) injection  40 mg Subcutaneous Q24H   famotidine  20 mg Oral Daily   feeding supplement  237 mL Oral BID BM   magic mouthwash  5 mL Oral QID   nicotine  21 mg Transdermal Daily   valACYclovir  1,000 mg Oral BID   Continuous Infusions:  vancomycin 750 mg (07/02/21 1018)   PRN Meds:.acetaminophen **OR** acetaminophen, albuterol, ALPRAZolam, dicyclomine, hydrOXYzine, influenza vac split quadrivalent PF, ketorolac, loperamide,  methocarbamol, ondansetron **OR** ondansetron (ZOFRAN) IV    Objective: Weight change:   Intake/Output Summary (Last 24 hours) at 07/02/2021 1721 Last data filed at 07/02/2021 1528 Gross per 24 hour  Intake 300 ml  Output --  Net 300 ml   Blood pressure 116/82, pulse 98, temperature (!) 101.2 F (38.4 C), temperature source Oral, resp. rate 16, height 5\' 2"  (1.575 m), weight 60.3 kg, SpO2 100 %. Temp:  [98.6 F (37 C)-101.2 F (38.4 C)] 101.2 F (38.4 C) (10/24 1701) Pulse Rate:  [97-101] 98 (10/24 1701) Resp:  [16-18] 16 (10/24 1701) BP: (116-139)/(80-84) 116/82 (10/24 1701) SpO2:  [98 %-100 %] 100 % (10/24 1701)  Physical Exam: Physical Exam Vitals reviewed.  Constitutional:      General: She is not in acute distress.    Appearance: She is ill-appearing. She is not diaphoretic.  HENT:     Head: Normocephalic and atraumatic.     Comments: Missing several front teeth    Right Ear: External ear normal.     Left Ear: External ear normal.     Mouth/Throat:     Pharynx: No oropharyngeal exudate.  Eyes:     General: No scleral icterus.  Conjunctiva/sclera: Conjunctivae normal.     Pupils: Pupils are equal, round, and reactive to light.  Cardiovascular:     Rate and Rhythm: Regular rhythm. Tachycardia present.     Heart sounds: Normal heart sounds. No murmur heard.   No friction rub. No gallop.  Pulmonary:     Effort: Pulmonary effort is normal. No respiratory distress.     Breath sounds: Normal breath sounds. No stridor. No wheezing, rhonchi or rales.  Abdominal:     General: Bowel sounds are normal. There is no distension.     Palpations: Abdomen is soft.     Tenderness: There is no abdominal tenderness. There is no rebound.  Musculoskeletal:        General: No tenderness. Normal range of motion.  Lymphadenopathy:     Cervical: No cervical adenopathy.  Skin:    General: Skin is warm and dry.     Coloration: Skin is not pale.     Findings: No erythema or rash.   Neurological:     General: No focal deficit present.     Mental Status: She is alert and oriented to person, place, and time.     Motor: No abnormal muscle tone.     Coordination: Coordination normal.  Psychiatric:        Mood and Affect: Mood normal.        Behavior: Behavior normal.        Thought Content: Thought content normal.        Judgment: Judgment normal.     CBC:    BMET Recent Labs    07/01/21 0155 07/02/21 0101  NA 129* 132*  K 3.7 4.0  CL 102 105  CO2 19* 19*  GLUCOSE 132* 98  BUN 13 14  CREATININE 0.66 0.59  CALCIUM 7.4* 7.4*     Liver Panel  No results for input(s): PROT, ALBUMIN, AST, ALT, ALKPHOS, BILITOT, BILIDIR, IBILI in the last 72 hours.     Sedimentation Rate Recent Labs    06/30/21 0127  ESRSEDRATE 35*   C-Reactive Protein No results for input(s): CRP in the last 72 hours.  Micro Results: Recent Results (from the past 720 hour(s))  Blood culture (routine x 2)     Status: Abnormal   Collection Time: 06/29/21  9:54 AM   Specimen: BLOOD RIGHT HAND  Result Value Ref Range Status   Specimen Description BLOOD RIGHT HAND  Final   Special Requests   Final    BOTTLES DRAWN AEROBIC ONLY Blood Culture results may not be optimal due to an inadequate volume of blood received in culture bottles   Culture  Setup Time   Final    GRAM POSITIVE COCCI AEROBIC BOTTLE ONLY CRITICAL RESULT CALLED TO, READ BACK BY AND VERIFIED WITH: G,BARR PHARMD @1338  06/30/21 EB Performed at [facility], [place] [address], [place], [place] [address]    Culture METHICILLIN RESISTANT STAPHYLOCOCCUS AUREUS (A)  Final   Report Status 07/02/2021 FINAL  Final   Organism ID, Bacteria METHICILLIN RESISTANT STAPHYLOCOCCUS AUREUS  Final      Susceptibility   Methicillin resistant staphylococcus aureus - MIC*    CIPROFLOXACIN <=0.5 SENSITIVE Sensitive     ERYTHROMYCIN >=8 RESISTANT Resistant     GENTAMICIN <=0.5 SENSITIVE Sensitive     OXACILLIN >=4 RESISTANT  Resistant     TETRACYCLINE <=1 SENSITIVE Sensitive     VANCOMYCIN 1 SENSITIVE Sensitive     TRIMETH/SULFA <=10 SENSITIVE Sensitive     CLINDAMYCIN <=0.25 SENSITIVE  Sensitive     RIFAMPIN <=0.5 SENSITIVE Sensitive     Inducible Clindamycin NEGATIVE Sensitive     * METHICILLIN RESISTANT STAPHYLOCOCCUS AUREUS  Blood Culture ID Panel (Reflexed)     Status: Abnormal   Collection Time: 06/29/21  9:54 AM  Result Value Ref Range Status   Enterococcus faecalis NOT DETECTED NOT DETECTED Final   Enterococcus Faecium NOT DETECTED NOT DETECTED Final   Listeria monocytogenes NOT DETECTED NOT DETECTED Final   Staphylococcus species DETECTED (A) NOT DETECTED Final    Comment: CRITICAL RESULT CALLED TO, READ BACK BY AND VERIFIED WITH: G,BARR PHARMD @1338  06/30/21 EB    Staphylococcus aureus (BCID) DETECTED (A) NOT DETECTED Final    Comment: Methicillin (oxacillin)-resistant Staphylococcus aureus (MRSA). MRSA is predictably resistant to beta-lactam antibiotics (except ceftaroline). Preferred therapy is vancomycin unless clinically contraindicated. Patient requires contact precautions if  hospitalized. CRITICAL RESULT CALLED TO, READ BACK BY AND VERIFIED WITH: G,BARR PHARMD @1338  06/30/21 EB    Staphylococcus epidermidis NOT DETECTED NOT DETECTED Final   Staphylococcus lugdunensis NOT DETECTED NOT DETECTED Final   Streptococcus species NOT DETECTED NOT DETECTED Final   Streptococcus agalactiae NOT DETECTED NOT DETECTED Final   Streptococcus pneumoniae NOT DETECTED NOT DETECTED Final   Streptococcus pyogenes NOT DETECTED NOT DETECTED Final   A.calcoaceticus-baumannii NOT DETECTED NOT DETECTED Final   Bacteroides fragilis NOT DETECTED NOT DETECTED Final   Enterobacterales NOT DETECTED NOT DETECTED Final   Enterobacter cloacae complex NOT DETECTED NOT DETECTED Final   Escherichia coli NOT DETECTED NOT DETECTED Final   Klebsiella aerogenes NOT DETECTED NOT DETECTED Final   Klebsiella oxytoca NOT  DETECTED NOT DETECTED Final   Klebsiella pneumoniae NOT DETECTED NOT DETECTED Final   Proteus species NOT DETECTED NOT DETECTED Final   Salmonella species NOT DETECTED NOT DETECTED Final   Serratia marcescens NOT DETECTED NOT DETECTED Final   Haemophilus influenzae NOT DETECTED NOT DETECTED Final   Neisseria meningitidis NOT DETECTED NOT DETECTED Final   Pseudomonas aeruginosa NOT DETECTED NOT DETECTED Final   Stenotrophomonas maltophilia NOT DETECTED NOT DETECTED Final   Candida albicans NOT DETECTED NOT DETECTED Final   Candida auris NOT DETECTED NOT DETECTED Final   Candida glabrata NOT DETECTED NOT DETECTED Final   Candida krusei NOT DETECTED NOT DETECTED Final   Candida parapsilosis NOT DETECTED NOT DETECTED Final   Candida tropicalis NOT DETECTED NOT DETECTED Final   Cryptococcus neoformans/gattii NOT DETECTED NOT DETECTED Final   Meth resistant mecA/C and MREJ DETECTED (A) NOT DETECTED Final    Comment: CRITICAL RESULT CALLED TO, READ BACK BY AND VERIFIED WITH: G,BARR PHARMD @1338  06/30/21 EB Performed at [facility], [address] [place], [place] [address]   Resp Panel by RT-PCR (Flu A&B, Covid) Nasopharyngeal Swab     Status: None   Collection Time: 06/29/21  9:55 AM   Specimen: Nasopharyngeal Swab; Nasopharyngeal(NP) swabs in vial transport medium  Result Value Ref Range Status   SARS Coronavirus 2 by RT PCR NEGATIVE NEGATIVE Final    Comment: (NOTE) SARS-CoV-2 target nucleic acids are NOT DETECTED.  The SARS-CoV-2 RNA is generally detectable in upper respiratory specimens during the acute phase of infection. The lowest concentration of SARS-CoV-2 viral copies this assay can detect is 138 copies/mL. A negative result does not preclude SARS-Cov-2 infection and should not be used as the sole basis for treatment or other patient management decisions. A negative result may occur with  improper specimen collection/handling, submission of specimen other than  nasopharyngeal swab,  presence of viral mutation(s) within the areas targeted by this assay, and inadequate number of viral copies(<138 copies/mL). A negative result must be combined with clinical observations, patient history, and epidemiological information. The expected result is Negative.  Fact Sheet for Patients:  EntrepreneurPulse.com.au  Fact Sheet for Healthcare Providers:  IncredibleEmployment.be  This test is no t yet approved or cleared by the [place] FDA and  has been authorized for detection and/or diagnosis of SARS-CoV-2 by FDA under an Emergency Use Authorization (EUA). This EUA will remain  in effect (meaning this test can be used) for the duration of the COVID-19 declaration under Section 564(b)(1) of the Act, 21 U.S.C.section 360bbb-3(b)(1), unless the authorization is terminated  or revoked sooner.       Influenza A by PCR NEGATIVE NEGATIVE Final   Influenza B by PCR NEGATIVE NEGATIVE Final    Comment: (NOTE) The Xpert Xpress SARS-CoV-2/FLU/RSV plus assay is intended as an aid in the diagnosis of influenza from Nasopharyngeal swab specimens and should not be used as a sole basis for treatment. Nasal washings and aspirates are unacceptable for Xpert Xpress SARS-CoV-2/FLU/RSV testing.  Fact Sheet for Patients: EntrepreneurPulse.com.au  Fact Sheet for Healthcare Providers: IncredibleEmployment.be  This test is not yet approved or cleared by the [place] FDA and has been authorized for detection and/or diagnosis of SARS-CoV-2 by FDA under an Emergency Use Authorization (EUA). This EUA will remain in effect (meaning this test can be used) for the duration of the COVID-19 declaration under Section 564(b)(1) of the Act, 21 U.S.C. section 360bbb-3(b)(1), unless the authorization is terminated or revoked.  Performed at [facility], [place] [address], [place], [place] [address]    Blood culture (routine x 2)     Status: Abnormal (Preliminary result)   Collection Time: 06/29/21  3:15 PM   Specimen: BLOOD  Result Value Ref Range Status   Specimen Description BLOOD BLOOD LEFT FOREARM  Final   Special Requests   Final    BOTTLES DRAWN AEROBIC AND ANAEROBIC Blood Culture adequate volume   Culture  Setup Time   Final    GRAM POSITIVE COCCI ANAEROBIC BOTTLE ONLY CRITICAL VALUE NOTED.  VALUE IS CONSISTENT WITH PREVIOUSLY REPORTED AND CALLED VALUE.    Culture (A)  Final    STAPHYLOCOCCUS AUREUS SUSCEPTIBILITIES PERFORMED ON PREVIOUS CULTURE WITHIN THE LAST 5 DAYS. Performed at [facility], [place] [address] [place], [place] [address]    Report Status PENDING  Incomplete  Culture, blood (routine x 2)     Status: Abnormal   Collection Time: 06/30/21  6:50 PM   Specimen: BLOOD  Result Value Ref Range Status   Specimen Description BLOOD LEFT ANTECUBITAL  Final   Special Requests   Final    BOTTLES DRAWN AEROBIC ONLY Blood Culture results may not be optimal due to an inadequate volume of blood received in culture bottles   Culture  Setup Time   Final    GRAM POSITIVE COCCI IN CLUSTERS AEROBIC BOTTLE ONLY CRITICAL VALUE NOTED.  VALUE IS CONSISTENT WITH PREVIOUSLY REPORTED AND CALLED VALUE.    Culture (A)  Final    STAPHYLOCOCCUS AUREUS SUSCEPTIBILITIES PERFORMED ON PREVIOUS CULTURE WITHIN THE LAST 5 DAYS. Performed at [facility], [place] [address] [place],  [address]    Report Status 07/02/2021 FINAL  Final  Culture, blood (routine x 2)     Status: Abnormal   Collection Time: 06/30/21  6:55 PM   Specimen: BLOOD  Result Value  Ref Range Status   Specimen Description BLOOD LEFT ANTECUBITAL  Final   Special Requests   Final    BOTTLES DRAWN AEROBIC ONLY Blood Culture results may not be optimal due to an inadequate volume of blood received in culture bottles   Culture  Setup Time   Final    GRAM POSITIVE COCCI IN CLUSTERS AEROBIC BOTTLE  ONLY CRITICAL VALUE NOTED.  VALUE IS CONSISTENT WITH PREVIOUSLY REPORTED AND CALLED VALUE.    Culture (A)  Final    STAPHYLOCOCCUS AUREUS SUSCEPTIBILITIES PERFORMED ON PREVIOUS CULTURE WITHIN THE LAST 5 DAYS. Performed at [facility], [place] [address] [place], [place] [address]    Report Status 07/02/2021 FINAL  Final    Studies/Results: No results found.    Assessment/Plan:  INTERVAL HISTORY:    Principal Problem:   MRSA bacteremia Active Problems:   Severe opioid use disorder ([place])   Endocarditis   Septic pulmonary embolism ([facility])   Chronic viral hepatitis C ([place])    Yvette Gaines is a 50 y.o. 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 Will ensure she is immune to hep b and A. WOuld consider PrEP against HIV.   LOS: 3 days   Alcide Evener 07/02/2021, 5:21 PM

## 2021-07-02 NOTE — Progress Notes (Signed)
    [facility] has been requested to perform a transesophageal echocardiogram on 07/02/2021 for endocarditis.  After careful review of history and examination, the risks and benefits of transesophageal echocardiogram have been explained including risks of esophageal damage, perforation (1:10,000 risk), bleeding, pharyngeal hematoma as well as other potential complications associated with conscious sedation including aspiration, arrhythmia, respiratory failure and death. Alternatives to treatment were discussed, questions were answered. Patient is willing to proceed.   Patient is a 50 yo female with PMH of IV drug abuse presented for eval of endocarditis. Patient initially was found to have abscess in the left shoulder in early Oct, underwent I&D on 10/6 culture showed MRSA, however left AMA after the surgery. [facility]ed to [facility] on 10/16 with chest pain and SOB and found to have 1 cm tricuspid valve vegetation on echo. Started on IV abx, developed acute thrombocytopenia, DIC was ruled out. She signed out AMA again and came to [facility] for further eval. Repeat blood culture on 10/21 is positive for MRSA. Vital sign stable. Normal renal function. Hemoglobin 8.1, low but stable. Platelet 502.   Almyra Deforest, PA-C 07/02/2021 1:05 PM

## 2021-07-02 NOTE — Progress Notes (Signed)
Mobility Specialist Progress Note    07/02/21 1242  Mobility  Activity Ambulated in hall  Level of Assistance Contact guard assist, steadying assist  Assistive Device None  Distance Ambulated (ft) 230 ft  Mobility Ambulated with assistance in hallway  Mobility Response Tolerated well  Mobility performed by Mobility specialist  $Mobility charge 1 Mobility   Pt received in bed and agreeable. Pt said she was experiencing her usual pain and that it was bearable. Returned to bed with call bell in reach.   Hildred Alamin Mobility Specialist  Mobility Specialist Phone: [phone]

## 2021-07-02 NOTE — Progress Notes (Signed)
PROGRESS NOTE    Yvette Gaines  BTD:[id number] DOB: [birth year] DOA: 06/29/2021 PCP: [name], DO    Chief Complaint  Patient presents with   Chest Pain   Weakness   Shortness of Breath   Drug Problem    Brief Narrative:   Yvette Gaines is a 50 y.o. 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   Patient presented to [facility] on 10/05 for worsening of left shoulder swelling and pain.  Was found to have abscess of left shoulder and left calf,  I&D was done on 10/06 and culture showed MRSA.[name]  Patient however signed out Yvette Gaines after the surgery. On 10/16, patient came back to [facility] complaining about new onset of chest pains and shortness of breath, CT chest showed multiple bilateral septic emboli, echocardiogram showed new onset of tricuspid vegetation compatible with endocarditis.  Blood culture showed again MRSA.  Patient was started on vancomycin and cefepime since. During hospital stay, patient was also found to develop acute thrombocytopenia, DIC was ruled out.  Patient was admitted to [facility], and started on IV vancomycin, blood cultures on admission still growing MRSA.     Assessment & Plan:   Principal Problem:   MRSA bacteremia Active Problems:   Severe opioid use disorder ([place])   Endocarditis   Septic pulmonary embolism (HCC)   Chronic viral hepatitis C (HCC)   MRSA bacteremia/MRSA TV endocarditis/septic emboli/recent right shoulder and right calf abscess status post I&D 10/6 at [facility] . -Blood culture on admission still growing MRSA, follow on surveillance blood cultures. -Continue with IV vancomycin for now. -Plan for TEE in a.m. - CT surgery consult appreciated, further recommendation pending TEE and repeat blood cultures. 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, DIC was ruled out.    Recent right shoulder abscess -Status post I&D on 10/06 at [facility], according to records and imaging study and orthopedic note, there was no infection on [facility] joint itself, and patient recovered well. No acute issue now.   Moderate protein calorie malnutrition -Start protein supplement   Mild intermittent asthma -At baseline   Cigarette smoking -Nicotine patch.  Hypokalemia - repleted   Hyponatremia -trending down, will keep a fluid restriction, appears to be euvolemic.  IV drug abuse - started on Suboxone, avoid narcotics.   increased Suboxone to 1.5 tablets oral daily from tomorrow.  Normocytic anemia -This is most likely anemia of acute illness/anemia of chronic disease, anemia panel with normal X64, folic acid, and ferritin level, iron level is low. -Transfuse for hemoglobin less than 7.     +HCV AB - check HCV RNA  DVT prophylaxis: Heparin Code Status: Full Family Communication: None at bedside Disposition:   Status is: Inpatient  Remains inpatient appropriate because: Bacteremia with need for IV antibiotics       Consultants:  ID CT surgery   Subjective:  Fever of 100.5 overnight.  Objective: Vitals:   07/01/21 2104 07/01/21 2225 07/02/21 0446 07/02/21 0738  BP: 139/81  124/84 116/80  Pulse: 99  (!) 101 97  Resp: 18  18 16   Temp: (!) 100.5 F (38.1 C) 99.1 F (37.3 C) 98.6 F (37 C) 99.4 F (37.4 C)  TempSrc: Oral Oral Oral Oral  SpO2: 98%  100% 98%  Weight:      Height:       No intake or output data in the 24 hours ending 07/02/21 1335  Filed Weights   06/29/21 1100  Weight: 60.3 kg    Examination:  Awake Alert, Oriented X 3, No new F.N deficits, Normal affect Symmetrical Chest wall movement, Good air movement bilaterally,  CTAB RRR,No Gallops,Rubs  +ve B.Sounds, Abd Soft, No tenderness, No rebound - guarding or rigidity. No Cyanosis, Clubbing or edema, No new Rash or bruise         Data Reviewed: I have personally reviewed following labs and imaging studies  CBC: Recent Labs  Lab 06/29/21 0924 06/30/21 0127 07/01/21 0155 07/02/21 0101  WBC 16.9* 16.3* 12.0* 9.2  HGB 8.5* 7.6* 7.1* 8.1*  HCT 27.4* 23.2* 21.8* 25.3*  MCV 90.1 84.4 84.8 87.2  PLT 336 404* 402* 502*    Basic Metabolic Panel: Recent Labs  Lab 06/29/21 0924 06/30/21 0127 07/01/21 0155 07/02/21 0101  NA 131* 130* 129* 132*  K 3.7 3.2* 3.7 4.0  CL 103 103 102 105  CO2 17* 19* 19* 19*  GLUCOSE 98 99 132* 98  BUN 13 11 13 14   CREATININE 0.61 0.65 0.66 0.59  CALCIUM 7.7* 7.3* 7.4* 7.4*  MG  --   --  1.9  --   PHOS  --   --  2.6 4.0    GFR: Estimated Creatinine Clearance: 72 mL/min (by C-G formula based on SCr of 0.59 mg/dL).  Liver Function Tests: Recent Labs  Lab 06/29/21 0924  AST 40  ALT 50*  ALKPHOS 167*  BILITOT 1.2  PROT 6.0*  ALBUMIN 1.8*    CBG: No results for input(s): GLUCAP in the last 168 hours.   Recent Results (from the past 240 hour(s))  Blood culture (routine x 2)     Status: Abnormal   Collection Time: 06/29/21  9:54 AM   Specimen: BLOOD RIGHT HAND  Result Value Ref Range Status   Specimen Description BLOOD RIGHT HAND  Final   Special Requests   Final    BOTTLES DRAWN AEROBIC ONLY Blood Culture results may not be optimal due to an inadequate volume of blood received in culture bottles   Culture  Setup Time   Final    GRAM POSITIVE COCCI AEROBIC BOTTLE ONLY CRITICAL RESULT CALLED TO, READ BACK BY AND VERIFIED WITH: G,BARR PHARMD @1338  06/30/21 EB Performed at [facility], [place] [address] [place], [place] [address]    Culture METHICILLIN RESISTANT STAPHYLOCOCCUS AUREUS (A)  Final   Report Status 07/02/2021 FINAL  Final   Organism ID, Bacteria METHICILLIN RESISTANT  STAPHYLOCOCCUS AUREUS  Final      Susceptibility   Methicillin resistant staphylococcus aureus - MIC*    CIPROFLOXACIN <=0.5 SENSITIVE Sensitive     ERYTHROMYCIN >=8 RESISTANT Resistant     GENTAMICIN <=0.5 SENSITIVE Sensitive     OXACILLIN >=4 RESISTANT Resistant     TETRACYCLINE <=1 SENSITIVE Sensitive     VANCOMYCIN 1 SENSITIVE Sensitive     TRIMETH/SULFA <=10 SENSITIVE Sensitive     CLINDAMYCIN <=0.25 SENSITIVE Sensitive     RIFAMPIN <=0.5 SENSITIVE Sensitive     Inducible Clindamycin NEGATIVE Sensitive     * METHICILLIN RESISTANT STAPHYLOCOCCUS AUREUS  Blood Culture ID Panel (Reflexed)     Status: Abnormal   Collection Time: 06/29/21  9:54 AM  Result Value Ref Range Status   Enterococcus  faecalis NOT DETECTED NOT DETECTED Final   Enterococcus Faecium NOT DETECTED NOT DETECTED Final   Listeria monocytogenes NOT DETECTED NOT DETECTED Final   Staphylococcus species DETECTED (A) NOT DETECTED Final    Comment: CRITICAL RESULT CALLED TO, READ BACK BY AND VERIFIED WITH: G,BARR PHARMD @1338  06/30/21 EB    Staphylococcus aureus (BCID) DETECTED (A) NOT DETECTED Final    Comment: Methicillin (oxacillin)-resistant Staphylococcus aureus (MRSA). MRSA is predictably resistant to beta-lactam antibiotics (except ceftaroline). Preferred therapy is vancomycin unless clinically contraindicated. Patient requires contact precautions if  hospitalized. CRITICAL RESULT CALLED TO, READ BACK BY AND VERIFIED WITH: G,BARR PHARMD @1338  06/30/21 EB    Staphylococcus epidermidis NOT DETECTED NOT DETECTED Final   Staphylococcus lugdunensis NOT DETECTED NOT DETECTED Final   Streptococcus species NOT DETECTED NOT DETECTED Final   Streptococcus agalactiae NOT DETECTED NOT DETECTED Final   Streptococcus pneumoniae NOT DETECTED NOT DETECTED Final   Streptococcus pyogenes NOT DETECTED NOT DETECTED Final   A.calcoaceticus-baumannii NOT DETECTED NOT DETECTED Final   Bacteroides fragilis NOT DETECTED NOT DETECTED  Final   Enterobacterales NOT DETECTED NOT DETECTED Final   Enterobacter cloacae complex NOT DETECTED NOT DETECTED Final   Escherichia coli NOT DETECTED NOT DETECTED Final   Klebsiella aerogenes NOT DETECTED NOT DETECTED Final   Klebsiella oxytoca NOT DETECTED NOT DETECTED Final   Klebsiella pneumoniae NOT DETECTED NOT DETECTED Final   Proteus species NOT DETECTED NOT DETECTED Final   Salmonella species NOT DETECTED NOT DETECTED Final   Serratia marcescens NOT DETECTED NOT DETECTED Final   Haemophilus influenzae NOT DETECTED NOT DETECTED Final   Neisseria meningitidis NOT DETECTED NOT DETECTED Final   Pseudomonas aeruginosa NOT DETECTED NOT DETECTED Final   Stenotrophomonas maltophilia NOT DETECTED NOT DETECTED Final   Candida albicans NOT DETECTED NOT DETECTED Final   Candida auris NOT DETECTED NOT DETECTED Final   Candida glabrata NOT DETECTED NOT DETECTED Final   Candida krusei NOT DETECTED NOT DETECTED Final   Candida parapsilosis NOT DETECTED NOT DETECTED Final   Candida tropicalis NOT DETECTED NOT DETECTED Final   Cryptococcus neoformans/gattii NOT DETECTED NOT DETECTED Final   Meth resistant mecA/C and MREJ DETECTED (A) NOT DETECTED Final    Comment: CRITICAL RESULT CALLED TO, READ BACK BY AND VERIFIED WITH: G,BARR PHARMD @1338  06/30/21 EB Performed at [facility], [address] [place], [place] [address]   Resp Panel by RT-PCR (Flu A&B, Covid) Nasopharyngeal Swab     Status: None   Collection Time: 06/29/21  9:55 AM   Specimen: Nasopharyngeal Swab; Nasopharyngeal(NP) swabs in vial transport medium  Result Value Ref Range Status   SARS Coronavirus 2 by RT PCR NEGATIVE NEGATIVE Final    Comment: (NOTE) SARS-CoV-2 target nucleic acids are NOT DETECTED.  The SARS-CoV-2 RNA is generally detectable in upper respiratory specimens during the acute phase of infection. The lowest concentration of SARS-CoV-2 viral copies this assay can detect is 138 copies/mL. A negative  result does not preclude SARS-Cov-2 infection and should not be used as the sole basis for treatment or other patient management decisions. A negative result may occur with  improper specimen collection/handling, submission of specimen other than nasopharyngeal swab, presence of viral mutation(s) within the areas targeted by this assay, and inadequate number of viral copies(<138 copies/mL). A negative result must be combined with clinical observations, patient history, and epidemiological information. The expected result is Negative.  Fact Sheet for Patients:  EntrepreneurPulse.com.au  Fact Sheet for Healthcare Providers:  IncredibleEmployment.be  This test  is no t yet approved or cleared by the [place] and  has been authorized for detection and/or diagnosis of SARS-CoV-2 by FDA under an Emergency Use Authorization (EUA). This EUA will remain  in effect (meaning this test can be used) for the duration of the COVID-19 declaration under Section 564(b)(1) of the Act, 21 U.S.C.section 360bbb-3(b)(1), unless the authorization is terminated  or revoked sooner.       Influenza A by PCR NEGATIVE NEGATIVE Final   Influenza B by PCR NEGATIVE NEGATIVE Final    Comment: (NOTE) The Xpert Xpress SARS-CoV-2/FLU/RSV plus assay is intended as an aid in the diagnosis of influenza from Nasopharyngeal swab specimens and should not be used as a sole basis for treatment. Nasal washings and aspirates are unacceptable for Xpert Xpress SARS-CoV-2/FLU/RSV testing.  Fact Sheet for Patients: EntrepreneurPulse.com.au  Fact Sheet for Healthcare Providers: IncredibleEmployment.be  This test is not yet approved or cleared by the [place] FDA and has been authorized for detection and/or diagnosis of SARS-CoV-2 by FDA under an Emergency Use Authorization (EUA). This EUA will remain in effect (meaning this test can be used)  for the duration of the COVID-19 declaration under Section 564(b)(1) of the Act, 21 U.S.C. section 360bbb-3(b)(1), unless the authorization is terminated or revoked.  Performed at [facility], [place] [address] [place], [place] [address]   Blood culture (routine x 2)     Status: Abnormal (Preliminary result)   Collection Time: 06/29/21  3:15 PM   Specimen: BLOOD  Result Value Ref Range Status   Specimen Description BLOOD BLOOD LEFT FOREARM  Final   Special Requests   Final    BOTTLES DRAWN AEROBIC AND ANAEROBIC Blood Culture adequate volume   Culture  Setup Time   Final    GRAM POSITIVE COCCI ANAEROBIC BOTTLE ONLY CRITICAL VALUE NOTED.  VALUE IS CONSISTENT WITH PREVIOUSLY REPORTED AND CALLED VALUE.    Culture (A)  Final    STAPHYLOCOCCUS AUREUS SUSCEPTIBILITIES PERFORMED ON PREVIOUS CULTURE WITHIN THE LAST 5 DAYS. Performed at [facility], [place] [address] [place], [place] [address]    Report Status PENDING  Incomplete  Culture, blood (routine x 2)     Status: Abnormal   Collection Time: 06/30/21  6:50 PM   Specimen: BLOOD  Result Value Ref Range Status   Specimen Description BLOOD LEFT ANTECUBITAL  Final   Special Requests   Final    BOTTLES DRAWN AEROBIC ONLY Blood Culture results may not be optimal due to an inadequate volume of blood received in culture bottles   Culture  Setup Time   Final    GRAM POSITIVE COCCI IN CLUSTERS AEROBIC BOTTLE ONLY CRITICAL VALUE NOTED.  VALUE IS CONSISTENT WITH PREVIOUSLY REPORTED AND CALLED VALUE.    Culture (A)  Final    STAPHYLOCOCCUS AUREUS SUSCEPTIBILITIES PERFORMED ON PREVIOUS CULTURE WITHIN THE LAST 5 DAYS. Performed at [facility], [place] [address], [place], [place] [address]    Report Status 07/02/2021 FINAL  Final  Culture, blood (routine x 2)     Status: Abnormal   Collection Time: 06/30/21  6:55 PM   Specimen: BLOOD  Result Value Ref Range Status   Specimen Description BLOOD LEFT ANTECUBITAL  Final    Special Requests   Final    BOTTLES DRAWN AEROBIC ONLY Blood Culture results may not be optimal due to an inadequate volume of blood received in culture bottles   Culture  Setup Time   Final  GRAM POSITIVE COCCI IN CLUSTERS AEROBIC BOTTLE ONLY CRITICAL VALUE NOTED.  VALUE IS CONSISTENT WITH PREVIOUSLY REPORTED AND CALLED VALUE.    Culture (A)  Final    STAPHYLOCOCCUS AUREUS SUSCEPTIBILITIES PERFORMED ON PREVIOUS CULTURE WITHIN THE LAST 5 DAYS. Performed at [facility], [place] [address] [place], [place] [address]    Report Status 07/02/2021 FINAL  Final         Radiology Studies: No results found.      Scheduled Meds:  buprenorphine-naloxone  1.5 tablet Sublingual Daily   cloNIDine  0.1 mg Oral BH-qamhs   Followed by   Derrill Memo ON 07/04/2021] cloNIDine  0.1 mg Oral QAC breakfast   enoxaparin (LOVENOX) injection  40 mg Subcutaneous Q24H   famotidine  20 mg Oral Daily   feeding supplement  237 mL Oral BID BM   magic mouthwash  5 mL Oral QID   nicotine  21 mg Transdermal Daily   valACYclovir  1,000 mg Oral BID   Continuous Infusions:  vancomycin 750 mg (07/02/21 1018)     LOS: 3 days       Phillips Climes, MD [facility]   To contact the attending provider between 7A-7P or the covering provider during after hours 7P-7A, please log into the web site [email or web address] and access using universal [place] password for that web site. If you do not have the password, please call the hospital operator.  07/02/2021, 1:35 PM

## 2021-07-03 ENCOUNTER — Encounter (HOSPITAL_COMMUNITY): Payer: Self-pay | Admitting: Internal Medicine

## 2021-07-03 ENCOUNTER — Inpatient Hospital Stay (HOSPITAL_COMMUNITY): Payer: 59 | Admitting: Certified Registered"

## 2021-07-03 ENCOUNTER — Encounter (HOSPITAL_COMMUNITY)
Admission: EM | Disposition: A | Discharge status: Home / Self Care | Payer: Self-pay | Source: Home / Self Care | Attending: Student

## 2021-07-03 ENCOUNTER — Inpatient Hospital Stay (HOSPITAL_COMMUNITY): Payer: 59

## 2021-07-03 DIAGNOSIS — F1120 Opioid dependence, uncomplicated: Secondary | ICD-10-CM | POA: Diagnosis not present

## 2021-07-03 DIAGNOSIS — I361 Nonrheumatic tricuspid (valve) insufficiency: Secondary | ICD-10-CM

## 2021-07-03 DIAGNOSIS — R7881 Bacteremia: Secondary | ICD-10-CM

## 2021-07-03 DIAGNOSIS — B182 Chronic viral hepatitis C: Secondary | ICD-10-CM | POA: Diagnosis not present

## 2021-07-03 DIAGNOSIS — I330 Acute and subacute infective endocarditis: Secondary | ICD-10-CM | POA: Diagnosis not present

## 2021-07-03 DIAGNOSIS — L02419 Cutaneous abscess of limb, unspecified: Secondary | ICD-10-CM | POA: Diagnosis not present

## 2021-07-03 HISTORY — PX: TEE WITHOUT CARDIOVERSION: SHX5443

## 2021-07-03 LAB — CULTURE, BLOOD (ROUTINE X 2): Special Requests: ADEQUATE

## 2021-07-03 LAB — HEPATITIS A ANTIBODY, TOTAL: hep A Total Ab: REACTIVE — AB

## 2021-07-03 LAB — GLUCOSE, CAPILLARY: Glucose-Capillary: 117 mg/dL — ABNORMAL HIGH (ref 70–99)

## 2021-07-03 LAB — HCV RNA QUANT

## 2021-07-03 SURGERY — ECHOCARDIOGRAM, TRANSESOPHAGEAL
Anesthesia: Monitor Anesthesia Care

## 2021-07-03 MED ORDER — SODIUM CHLORIDE 0.9 % IV SOLN
INTRAVENOUS | Status: DC | PRN
Start: 2021-07-03 — End: 2021-07-03

## 2021-07-03 MED ORDER — LACTATED RINGERS IV SOLN: INTRAVENOUS | Status: DC | PRN

## 2021-07-03 MED ORDER — PROPOFOL 500 MG/50ML IV EMUL
INTRAVENOUS | Status: DC | PRN
  Administered 2021-07-03: 150 ug/kg/min via INTRAVENOUS

## 2021-07-03 MED ORDER — EPHEDRINE SULFATE-NACL 50-0.9 MG/10ML-% IV SOSY
PREFILLED_SYRINGE | INTRAVENOUS | Status: DC | PRN
  Administered 2021-07-03: 10 mg via INTRAVENOUS

## 2021-07-03 MED ORDER — PROPOFOL 10 MG/ML IV BOLUS
INTRAVENOUS | Status: DC | PRN
  Administered 2021-07-03: 30 mg via INTRAVENOUS

## 2021-07-03 MED ORDER — PHENYLEPHRINE 40 MCG/ML (10ML) SYRINGE FOR IV PUSH (FOR BLOOD PRESSURE SUPPORT)
PREFILLED_SYRINGE | INTRAVENOUS | Status: DC | PRN
  Administered 2021-07-03: 120 ug via INTRAVENOUS
  Administered 2021-07-03 (×2): 80 ug via INTRAVENOUS
  Administered 2021-07-03: 120 ug via INTRAVENOUS

## 2021-07-03 MED ORDER — LIDOCAINE VISCOUS HCL 2 % MT SOLN
OROMUCOSAL | Status: DC | PRN
  Administered 2021-07-03: 1 via OROMUCOSAL

## 2021-07-03 MED ORDER — LIDOCAINE 2% (20 MG/ML) 5 ML SYRINGE
INTRAMUSCULAR | Status: DC | PRN
  Administered 2021-07-03: 40 mg via INTRAVENOUS

## 2021-07-03 NOTE — CV Procedure (Signed)
    TRANSESOPHAGEAL ECHOCARDIOGRAM   NAME:  Yvette Gaines    MRN: [id number] DOB:  09-09-1971    ADMIT DATE: 06/29/2021  INDICATIONS: Endocarditis  PROCEDURE:   Informed consent was obtained prior to the procedure. The risks, benefits and alternatives for the procedure were discussed and the patient comprehended these risks.  Risks include, but are not limited to, cough, sore throat, vomiting, nausea, somnolence, esophageal and stomach trauma or perforation, bleeding, low blood pressure, aspiration, pneumonia, infection, trauma to the teeth and death.    Procedural time out performed. The oropharynx was anesthetized with viscous lidocaine.  Anesthesia was administered by the anaesthesilogy team.  The patient's heart rate, blood pressure, and oxygen saturation were monitored continuously during the procedure.  The transesophageal probe was inserted in the esophagus and stomach without difficulty and multiple views were obtained.   The patient tolerated the procedure well.  COMPLICATIONS:    There were no immediate complications.  KEY FINDINGS:  EF 55-60%, Echodense mass on the tricuspid leaflet consistent with a vegetation. There is moderate tricuspid regurgitation. Mitral valve is structurally normal, Aortic valve structurally normal.  Full report to follow. Further management per primary team.   Berniece Salines, DO [place]  2:02 PM

## 2021-07-03 NOTE — Transfer of Care (Signed)
Immediate Anesthesia Transfer of Care Note  Patient: Destiney Sanabia  Procedure(s) Performed: TRANSESOPHAGEAL ECHOCARDIOGRAM (TEE)  Patient Location: Endoscopy Unit  Anesthesia Type:MAC  Level of Consciousness: drowsy  Airway & Oxygen Therapy: Patient Spontanous Breathing and Patient connected to nasal cannula oxygen  Post-op Assessment: Report given to RN  Post vital signs: Reviewed and stable  Last Vitals:  Vitals Value Taken Time  BP 104/61 07/03/21 1402  Temp    Pulse 88 07/03/21 1404  Resp 23 07/03/21 1404  SpO2 99 % 07/03/21 1404  Vitals shown include unvalidated device data.  Last Pain:  Vitals:   07/03/21 1237  TempSrc: Temporal  PainSc: 0-No pain      Patients Stated Pain Goal: 3 (10/09/41 8887)  Complications: No notable events documented.

## 2021-07-03 NOTE — Progress Notes (Signed)
PROGRESS NOTE    Yvette Gaines  MBT:[id number] DOB: Sep 04, 1971 DOA: 06/29/2021 PCP: [name], DO    Chief Complaint  Patient presents with   Chest Pain   Weakness   Shortness of Breath   Drug Problem    Brief Narrative:   Yvette Gaines is a 50 y.o. 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   Patient presented to [facility] on 10/05 for worsening of left shoulder swelling and pain.  Was found to have abscess of left shoulder and left calf,  I&D was done on 10/06 and culture showed MRSA.[name]  Patient however signed out [place] after the surgery. On 10/16, patient came back to [facility] complaining about new onset of chest pains and shortness of breath, CT chest showed multiple bilateral septic emboli, echocardiogram showed new onset of tricuspid vegetation compatible with endocarditis.  Blood culture showed again MRSA.  Patient was started on vancomycin and cefepime since. During hospital stay, patient was also found to develop acute thrombocytopenia, DIC was ruled out.  Patient was admitted to [facility], and started on IV vancomycin, blood cultures on admission still growing MRSA.     Assessment & Plan:   Principal Problem:   MRSA bacteremia Active Problems:   Severe opioid use disorder ([place])   Endocarditis   Septic pulmonary embolism (HCC)   Chronic viral hepatitis C ([place])   Shoulder abscess   Calf abscess   Odynophagia   MRSA bacteremia/MRSA TV endocarditis/septic emboli/recent right shoulder and right calf abscess status post I&D 10/6 at [facility] . -Patient with persistent bacteremia, she has positive blood cultures obtained on 10/21, 10/22, and 10/24.   -Continue with IV vancomycin for now. -Plan for TEE today. - CT surgery consult appreciated,  further recommendation pending TEE and repeat blood cultures. - Avoid PICC line currently due to bacteremia -She was started on Magic mouthwash and Valtrex for odynophagia. -She remains with fever almost daily.  Recent thrombocytopenia -Resolved during last admission at [facility], DIC was ruled out.    Recent right shoulder abscess -Status post I&D on 10/06 at [facility], according to records and imaging study and orthopedic note, there was no infection on [facility] joint itself, and patient recovered well. No acute issue now.   Moderate protein calorie malnutrition -Start protein supplement   Mild intermittent asthma -At baseline   Cigarette smoking -Nicotine patch.  Hypokalemia - repleted   Hyponatremia -trending down, will keep a fluid restriction, appears to be euvolemic.  IV drug abuse - started on Suboxone, avoid narcotics.   increased Suboxone to 1.5 tablets oral daily from tomorrow.  Normocytic anemia -This is most likely anemia of acute illness/anemia of chronic disease, anemia panel with normal T36, folic acid, and ferritin level, iron level is low. -Transfuse for hemoglobin less than 7.     +HCV AB - check HCV RNA  DVT prophylaxis: Heparin Code Status: Full Family Communication: None at bedside Disposition:   Status is: Inpatient  Remains inpatient appropriate because: Bacteremia with need for IV antibiotics       Consultants:  ID CT surgery   Subjective:  She is afebrile overnight, but T-max is 101.2 at 3 PM yesterday  Objective: Vitals:   07/02/21 2012 07/03/21 0457 07/03/21 0851 07/03/21 1237  BP: 123/79 121/87 108/70 103/73  Pulse: 86 77 72 74  Resp: 19 16 17 12   Temp:  98.5 F (36.9 C) 98.1 F (36.7 C) 99 F (37.2 C)  TempSrc:  Oral Oral Temporal  SpO2: 100% 100% 100% 99%  Weight:    59 kg  Height:    5\' 2"  (1.575 m)    Intake/Output Summary (Last 24 hours) at 07/03/2021 1323 Last data filed at 07/03/2021 0900 Gross per 24 hour   Intake 300 ml  Output --  Net 300 ml    Filed Weights   06/29/21 1100 07/03/21 1237  Weight: 60.3 kg 59 kg    Examination:  Awake Alert, Oriented X 3, No new F.N deficits, Normal affect Symmetrical Chest wall movement, Good air movement bilaterally, CTAB RRR,No Gallops,Rubs , No Parasternal Heave +ve B.Sounds, Abd Soft, No tenderness, No rebound - guarding or rigidity. No Cyanosis, Clubbing or edema, No new Rash or bruise          Data Reviewed: I have personally reviewed following labs and imaging studies  CBC: Recent Labs  Lab 06/29/21 0924 06/30/21 0127 07/01/21 0155 07/02/21 0101  WBC 16.9* 16.3* 12.0* 9.2  HGB 8.5* 7.6* 7.1* 8.1*  HCT 27.4* 23.2* 21.8* 25.3*  MCV 90.1 84.4 84.8 87.2  PLT 336 404* 402* 502*    Basic Metabolic Panel: Recent Labs  Lab 06/29/21 0924 06/30/21 0127 07/01/21 0155 07/02/21 0101  NA 131* 130* 129* 132*  K 3.7 3.2* 3.7 4.0  CL 103 103 102 105  CO2 17* 19* 19* 19*  GLUCOSE 98 99 132* 98  BUN 13 11 13 14   CREATININE 0.61 0.65 0.66 0.59  CALCIUM 7.7* 7.3* 7.4* 7.4*  MG  --   --  1.9  --   PHOS  --   --  2.6 4.0    GFR: Estimated Creatinine Clearance: 66.5 mL/min (by C-G formula based on SCr of 0.59 mg/dL).  Liver Function Tests: Recent Labs  Lab 06/29/21 0924  AST 40  ALT 50*  ALKPHOS 167*  BILITOT 1.2  PROT 6.0*  ALBUMIN 1.8*    CBG: No results for input(s): GLUCAP in the last 168 hours.   Recent Results (from the past 240 hour(s))  Blood culture (routine x 2)     Status: Abnormal   Collection Time: 06/29/21  9:54 AM   Specimen: BLOOD RIGHT HAND  Result Value Ref Range Status   Specimen Description BLOOD RIGHT HAND  Final   Special Requests   Final    BOTTLES DRAWN AEROBIC ONLY Blood Culture results may not be optimal due to an inadequate volume of blood received in culture bottles   Culture  Setup Time   Final    GRAM POSITIVE COCCI AEROBIC BOTTLE ONLY CRITICAL RESULT CALLED TO, READ BACK BY AND  VERIFIED WITH: G,BARR PHARMD @1338  06/30/21 EB Performed at [facility], [place] [address], [place], [place] [address]    Culture METHICILLIN RESISTANT STAPHYLOCOCCUS AUREUS (A)  Final   Report Status 07/02/2021 FINAL  Final   Organism ID, Bacteria METHICILLIN RESISTANT STAPHYLOCOCCUS AUREUS  Final      Susceptibility   Methicillin resistant staphylococcus aureus - MIC*    CIPROFLOXACIN <=0.5 SENSITIVE Sensitive     ERYTHROMYCIN >=8 RESISTANT Resistant     GENTAMICIN <=0.5 SENSITIVE Sensitive     OXACILLIN >=4 RESISTANT Resistant     TETRACYCLINE <=1 SENSITIVE Sensitive     VANCOMYCIN 1 SENSITIVE Sensitive     TRIMETH/SULFA <=10 SENSITIVE Sensitive     CLINDAMYCIN <=0.25 SENSITIVE Sensitive  RIFAMPIN <=0.5 SENSITIVE Sensitive     Inducible Clindamycin NEGATIVE Sensitive     * METHICILLIN RESISTANT STAPHYLOCOCCUS AUREUS  Blood Culture ID Panel (Reflexed)     Status: Abnormal   Collection Time: 06/29/21  9:54 AM  Result Value Ref Range Status   Enterococcus faecalis NOT DETECTED NOT DETECTED Final   Enterococcus Faecium NOT DETECTED NOT DETECTED Final   Listeria monocytogenes NOT DETECTED NOT DETECTED Final   Staphylococcus species DETECTED (A) NOT DETECTED Final    Comment: CRITICAL RESULT CALLED TO, READ BACK BY AND VERIFIED WITH: G,BARR PHARMD @1338  06/30/21 EB    Staphylococcus aureus (BCID) DETECTED (A) NOT DETECTED Final    Comment: Methicillin (oxacillin)-resistant Staphylococcus aureus (MRSA). MRSA is predictably resistant to beta-lactam antibiotics (except ceftaroline). Preferred therapy is vancomycin unless clinically contraindicated. Patient requires contact precautions if  hospitalized. CRITICAL RESULT CALLED TO, READ BACK BY AND VERIFIED WITH: G,BARR PHARMD @1338  06/30/21 EB    Staphylococcus epidermidis NOT DETECTED NOT DETECTED Final   Staphylococcus lugdunensis NOT DETECTED NOT DETECTED Final   Streptococcus species NOT DETECTED NOT DETECTED Final    Streptococcus agalactiae NOT DETECTED NOT DETECTED Final   Streptococcus pneumoniae NOT DETECTED NOT DETECTED Final   Streptococcus pyogenes NOT DETECTED NOT DETECTED Final   A.calcoaceticus-baumannii NOT DETECTED NOT DETECTED Final   Bacteroides fragilis NOT DETECTED NOT DETECTED Final   Enterobacterales NOT DETECTED NOT DETECTED Final   Enterobacter cloacae complex NOT DETECTED NOT DETECTED Final   Escherichia coli NOT DETECTED NOT DETECTED Final   Klebsiella aerogenes NOT DETECTED NOT DETECTED Final   Klebsiella oxytoca NOT DETECTED NOT DETECTED Final   Klebsiella pneumoniae NOT DETECTED NOT DETECTED Final   Proteus species NOT DETECTED NOT DETECTED Final   Salmonella species NOT DETECTED NOT DETECTED Final   Serratia marcescens NOT DETECTED NOT DETECTED Final   Haemophilus influenzae NOT DETECTED NOT DETECTED Final   Neisseria meningitidis NOT DETECTED NOT DETECTED Final   Pseudomonas aeruginosa NOT DETECTED NOT DETECTED Final   Stenotrophomonas maltophilia NOT DETECTED NOT DETECTED Final   Candida albicans NOT DETECTED NOT DETECTED Final   Candida auris NOT DETECTED NOT DETECTED Final   Candida glabrata NOT DETECTED NOT DETECTED Final   Candida krusei NOT DETECTED NOT DETECTED Final   Candida parapsilosis NOT DETECTED NOT DETECTED Final   Candida tropicalis NOT DETECTED NOT DETECTED Final   Cryptococcus neoformans/gattii NOT DETECTED NOT DETECTED Final   Meth resistant mecA/C and MREJ DETECTED (A) NOT DETECTED Final    Comment: CRITICAL RESULT CALLED TO, READ BACK BY AND VERIFIED WITH: G,BARR PHARMD @1338  06/30/21 EB Performed at [facility], [address], [place], [place] [address]   Resp Panel by RT-PCR (Flu A&B, Covid) Nasopharyngeal Swab     Status: None   Collection Time: 06/29/21  9:55 AM   Specimen: Nasopharyngeal Swab; Nasopharyngeal(NP) swabs in vial transport medium  Result Value Ref Range Status   SARS Coronavirus 2 by RT PCR NEGATIVE NEGATIVE Final     Comment: (NOTE) SARS-CoV-2 target nucleic acids are NOT DETECTED.  The SARS-CoV-2 RNA is generally detectable in upper respiratory specimens during the acute phase of infection. The lowest concentration of SARS-CoV-2 viral copies this assay can detect is 138 copies/mL. A negative result does not preclude SARS-Cov-2 infection and should not be used as the sole basis for treatment or other patient management decisions. A negative result may occur with  improper specimen collection/handling, submission of specimen other than nasopharyngeal swab, presence of viral mutation(s) within  the areas targeted by this assay, and inadequate number of viral copies(<138 copies/mL). A negative result must be combined with clinical observations, patient history, and epidemiological information. The expected result is Negative.  Fact Sheet for Patients:  EntrepreneurPulse.com.au  Fact Sheet for Healthcare Providers:  IncredibleEmployment.be  This test is no t yet approved or cleared by the [place] FDA and  has been authorized for detection and/or diagnosis of SARS-CoV-2 by FDA under an Emergency Use Authorization (EUA). This EUA will remain  in effect (meaning this test can be used) for the duration of the COVID-19 declaration under Section 564(b)(1) of the Act, 21 U.S.C.section 360bbb-3(b)(1), unless the authorization is terminated  or revoked sooner.       Influenza A by PCR NEGATIVE NEGATIVE Final   Influenza B by PCR NEGATIVE NEGATIVE Final    Comment: (NOTE) The Xpert Xpress SARS-CoV-2/FLU/RSV plus assay is intended as an aid in the diagnosis of influenza from Nasopharyngeal swab specimens and should not be used as a sole basis for treatment. Nasal washings and aspirates are unacceptable for Xpert Xpress SARS-CoV-2/FLU/RSV testing.  Fact Sheet for Patients: EntrepreneurPulse.com.au  Fact Sheet for Healthcare  Providers: IncredibleEmployment.be  This test is not yet approved or cleared by the [place] FDA and has been authorized for detection and/or diagnosis of SARS-CoV-2 by FDA under an Emergency Use Authorization (EUA). This EUA will remain in effect (meaning this test can be used) for the duration of the COVID-19 declaration under Section 564(b)(1) of the Act, 21 U.S.C. section 360bbb-3(b)(1), unless the authorization is terminated or revoked.  Performed at [facility], [place] [address] [place], [place] [address]   Blood culture (routine x 2)     Status: Abnormal   Collection Time: 06/29/21  3:15 PM   Specimen: BLOOD  Result Value Ref Range Status   Specimen Description BLOOD BLOOD LEFT FOREARM  Final   Special Requests   Final    BOTTLES DRAWN AEROBIC AND ANAEROBIC Blood Culture adequate volume   Culture  Setup Time   Final    GRAM POSITIVE COCCI ANAEROBIC BOTTLE ONLY CRITICAL VALUE NOTED.  VALUE IS CONSISTENT WITH PREVIOUSLY REPORTED AND CALLED VALUE.    Culture (A)  Final    STAPHYLOCOCCUS AUREUS SUSCEPTIBILITIES PERFORMED ON PREVIOUS CULTURE WITHIN THE LAST 5 DAYS. Performed at [facility], [place] [address] [place], [place] [address]    Report Status 07/03/2021 FINAL  Final  Culture, blood (routine x 2)     Status: Abnormal   Collection Time: 06/30/21  6:50 PM   Specimen: BLOOD  Result Value Ref Range Status   Specimen Description BLOOD LEFT ANTECUBITAL  Final   Special Requests   Final    BOTTLES DRAWN AEROBIC ONLY Blood Culture results may not be optimal due to an inadequate volume of blood received in culture bottles   Culture  Setup Time   Final    GRAM POSITIVE COCCI IN CLUSTERS AEROBIC BOTTLE ONLY CRITICAL VALUE NOTED.  VALUE IS CONSISTENT WITH PREVIOUSLY REPORTED AND CALLED VALUE.    Culture (A)  Final    STAPHYLOCOCCUS AUREUS SUSCEPTIBILITIES PERFORMED ON PREVIOUS CULTURE WITHIN THE LAST 5 DAYS. Performed at [facility], [place] [address] [place], [place] [address]    Report Status 07/02/2021 FINAL  Final  Culture, blood (routine x 2)     Status: Abnormal   Collection Time: 06/30/21  6:55 PM   Specimen: BLOOD  Result Value Ref Range Status  Specimen Description BLOOD LEFT ANTECUBITAL  Final   Special Requests   Final    BOTTLES DRAWN AEROBIC ONLY Blood Culture results may not be optimal due to an inadequate volume of blood received in culture bottles   Culture  Setup Time   Final    GRAM POSITIVE COCCI IN CLUSTERS AEROBIC BOTTLE ONLY CRITICAL VALUE NOTED.  VALUE IS CONSISTENT WITH PREVIOUSLY REPORTED AND CALLED VALUE.    Culture (A)  Final    STAPHYLOCOCCUS AUREUS SUSCEPTIBILITIES PERFORMED ON PREVIOUS CULTURE WITHIN THE LAST 5 DAYS. Performed at [facility], [place] [address] [place], [place] [address]    Report Status 07/02/2021 FINAL  Final  Culture, blood (routine x 2)     Status: Abnormal (Preliminary result)   Collection Time: 07/02/21  8:50 AM   Specimen: BLOOD  Result Value Ref Range Status   Specimen Description BLOOD SITE NOT SPECIFIED  Final   Special Requests IN PEDIATRIC BOTTLE Blood Culture adequate volume  Final   Culture  Setup Time   Final    GRAM POSITIVE COCCI AEROBIC BOTTLE ONLY CRITICAL VALUE NOTED.  VALUE IS CONSISTENT WITH PREVIOUSLY REPORTED AND CALLED VALUE.    Culture (A)  Final    STAPHYLOCOCCUS AUREUS Sent to [place] for further susceptibility testing. Performed at [facility], [place] [address] [place], [place] [address]    Report Status PENDING  Incomplete  Culture, blood (routine x 2)     Status: None (Preliminary result)   Collection Time: 07/02/21 11:50 AM   Specimen: BLOOD RIGHT FOREARM  Result Value Ref Range Status   Specimen Description BLOOD RIGHT FOREARM  Final   Special Requests   Final    BOTTLES DRAWN AEROBIC AND ANAEROBIC Blood Culture adequate volume   Culture   Final    NO GROWTH < 24 HOURS Performed at [facility], [place] [address], [place], [place] [address]    Report Status PENDING  Incomplete         Radiology Studies: No results found.      Scheduled Meds:  [MAR Hold] buprenorphine-naloxone  1.5 tablet Sublingual Daily   cloNIDine  0.1 mg Oral BH-qamhs   Followed by   [facility] Hold] cloNIDine  0.1 mg Oral QAC breakfast   [MAR Hold] enoxaparin (LOVENOX) injection  40 mg Subcutaneous Q24H   [MAR Hold] famotidine  20 mg Oral Daily   [MAR Hold] feeding supplement  237 mL Oral BID BM   [MAR Hold] magic mouthwash  5 mL Oral QID   [MAR Hold] nicotine  21 mg Transdermal Daily   [MAR Hold] valACYclovir  1,000 mg Oral BID   Continuous Infusions:  [MAR Hold] vancomycin 750 mg (07/03/21 1049)     LOS: 4 days       Phillips Climes, MD [facility]   To contact the attending provider between 7A-7P or the covering provider during after hours 7P-7A, please log into the web site [email or web address] and access using universal [place] password for that web site. If you do not have the password, please call the hospital operator.  07/03/2021, 1:23 PM

## 2021-07-03 NOTE — Progress Notes (Signed)
Patient reports feeling somewhat better today. She was having some anxiety before and right after her TEE. I explained to the patient that her results are not bad and what the providers already expected. Patient given PRN pain meds as ordered.

## 2021-07-03 NOTE — Progress Notes (Signed)
  Echocardiogram TEE has been performed.  Merrie Roof F 07/03/2021, 2:38 PM

## 2021-07-03 NOTE — Anesthesia Preprocedure Evaluation (Signed)
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  Reviewed: Allergy & Precautions, NPO status , Patient's Chart, lab work & pertinent test results  Airway Mallampati: II  TM Distance: >3 FB Neck ROM: Full    Dental  (+) Dental Advisory Given, Upper Dentures, Missing   Pulmonary asthma , Current Smoker and Patient abstained from smoking.,    Pulmonary exam normal breath sounds clear to auscultation       Cardiovascular Normal cardiovascular exam Rhythm:Regular Rate:Normal     Neuro/Psych  Headaches, PSYCHIATRIC DISORDERS Anxiety Depression Bipolar Disorder negative neurological ROS     GI/Hepatic negative GI ROS, (+)     substance abuse  IV drug use, Hepatitis -, C  Endo/Other  negative endocrine ROS  Renal/GU negative Renal ROS     Musculoskeletal  (+) Arthritis , narcotic dependent  Abdominal   Peds  Hematology  (+) Blood dyscrasia, anemia , Bacteremia    Anesthesia Other Findings Day of surgery medications reviewed with the patient.  Reproductive/Obstetrics                             Anesthesia Physical Anesthesia Plan  ASA: 3  Anesthesia Plan: MAC   Post-op Pain Management:    Induction: Intravenous  PONV Risk Score and Plan: 1 and Propofol infusion and Treatment may vary due to age or medical condition  Airway Management Planned: Nasal Cannula and Natural Airway  Additional Equipment:   Intra-op Plan:   Post-operative Plan:   Informed Consent: I have reviewed the patients History and Physical, chart, labs and discussed the procedure including the risks, benefits and alternatives for the proposed anesthesia with the patient or authorized  representative who has indicated his/her understanding and acceptance.     Dental advisory given  Plan Discussed with: CRNA and Anesthesiologist  Anesthesia Plan Comments:         Anesthesia Quick Evaluation

## 2021-07-03 NOTE — Anesthesia Postprocedure Evaluation (Signed)
Anesthesia Post Note  Patient: Yvette Gaines  Procedure(s) Performed: TRANSESOPHAGEAL ECHOCARDIOGRAM (TEE)     Patient location during evaluation: Endoscopy Anesthesia Type: MAC Level of consciousness: awake and alert Pain management: pain level controlled Vital Signs Assessment: post-procedure vital signs reviewed and stable Respiratory status: spontaneous breathing, nonlabored ventilation, respiratory function stable and patient connected to nasal cannula oxygen Cardiovascular status: stable and blood pressure returned to baseline Postop Assessment: no apparent nausea or vomiting Anesthetic complications: no   No notable events documented.  Last Vitals:  Vitals:   07/03/21 1413 07/03/21 1423  BP: 121/77 123/82  Pulse: 90 96  Resp: 11 17  Temp:    SpO2: 100% 100%    Last Pain:  Vitals:   07/03/21 1423  TempSrc:   PainSc: 0-No pain                 Catalina Gravel

## 2021-07-03 NOTE — Progress Notes (Signed)
Mobility Specialist Progress Note    07/03/21 0957  Mobility  Activity Ambulated in hall  Level of Assistance Independent  Assistive Device None  Distance Ambulated (ft) 230 ft  Mobility Ambulated independently in hallway  Mobility Response Tolerated well  Mobility performed by Mobility specialist  Bed Position  (couch)  $Mobility charge 1 Mobility   Pt received sitting EOB with RN present and agreeable. C/o L foot pain in arch and feeling nervous about TEE. Returned with call bell in reach.   Hildred Alamin Mobility Specialist  Mobility Specialist Phone: [phone]

## 2021-07-03 NOTE — Progress Notes (Signed)
This chaplain responded to a spiritual care consult for prayer.  The chaplain with the Pt. permission listened reflectively as the Pt. shared her relationship with religion and her hope for new beginnings.  The Pt. discerned the strength in prayer and requested a Bible and journal on her spiritual journey.  The Pt. accepted the chaplain's invitation for prayer and F/U spiritual care.  Chaplain Sallyanne Kuster [phone]

## 2021-07-03 NOTE — Progress Notes (Signed)
[facility]  Date of Admission:  06/29/2021     Total days of antibiotics 5         ASSESSMENT:  Yvette Gaines continues to have persistent MRSA bacteremia with most recent cultures on 07/02/21 with growing Staphylococcus aureus. TEE with 1.35 cm x 0.582 cm vegetation on tricuspid valve confirming endocarditis.  Consider CVTS evaluation to consider angiovac. Recheck blood cultures to determine clearance of bacteremia. Left shoulder and left calf pain resolved. Renal function stable with no evidence of nephrotoxicity. Consider change of antibiotics to daptomycin and ceftaroline given persistent bacteremia. Remaining medical and supportive care per primary team.   PLAN:  Continue vancomycin. Recheck blood cultures Consider CVTS evaluation Continue to monitor renal function on vancomycin.  Remaining medical and supportive care per Primary Team.     Principal Problem:   MRSA bacteremia Active Problems:   Severe opioid use disorder (HCC)   Endocarditis   Septic pulmonary embolism (HCC)   Chronic viral hepatitis C (HCC)   Shoulder abscess   Calf abscess   Odynophagia    buprenorphine-naloxone  1.5 tablet Sublingual Daily   cloNIDine  0.1 mg Oral BH-qamhs   Followed by   Derrill Memo ON 07/04/2021] cloNIDine  0.1 mg Oral QAC breakfast   enoxaparin (LOVENOX) injection  40 mg Subcutaneous Q24H   famotidine  20 mg Oral Daily   feeding supplement  237 mL Oral BID BM   magic mouthwash  5 mL Oral QID   nicotine  21 mg Transdermal Daily   valACYclovir  1,000 mg Oral BID    SUBJECTIVE:  Febrile with temperature of 101.2 over the past 24 hours with blood cultures persistently positive. TEE with vegetation on the tricuspid valve. Continues to have anxiety over plan of care.  No Known Allergies   Review of Systems: Review of Systems  Constitutional:  Negative for chills, fever and weight loss.  Respiratory:  Negative for cough, shortness of breath and  wheezing.   Cardiovascular:  Negative for chest pain and leg swelling.  Gastrointestinal:  Negative for abdominal pain, constipation, diarrhea, nausea and vomiting.  Skin:  Negative for rash.  Psychiatric/Behavioral:  The patient is nervous/anxious.      OBJECTIVE: Vitals:   07/03/21 1237 07/03/21 1403 07/03/21 1413 07/03/21 1423  BP: 103/73 104/61 121/77 123/82  Pulse: 74 86 90 96  Resp: 12 (!) 22 11 17   Temp: 99 F (37.2 C) (!) 97.2 F (36.2 C)    TempSrc: Temporal Temporal    SpO2: 99% 99% 100% 100%  Weight: 59 kg     Height: 5\' 2"  (1.575 m)      Body mass index is 23.78 kg/m.  Physical Exam Constitutional:      General: Yvette Gaines is not in acute distress.    Appearance: Yvette Gaines is well-developed.  Cardiovascular:     Rate and Rhythm: Normal rate and regular rhythm.     Heart sounds: Normal heart sounds.  Pulmonary:     Effort: Pulmonary effort is normal.     Breath sounds: Normal breath sounds.  Skin:    General: Skin is warm and dry.  Neurological:     Mental Status: Yvette Gaines is alert and oriented to person, place, and time.  Psychiatric:        Behavior: Behavior normal.        Thought Content: Thought content normal.        Judgment: Judgment normal.    Lab Results Lab Results  Component  Value Date   WBC 9.2 07/02/2021   HGB 8.1 (L) 07/02/2021   HCT 25.3 (L) 07/02/2021   MCV 87.2 07/02/2021   PLT 502 (H) 07/02/2021    Lab Results  Component Value Date   CREATININE 0.59 07/02/2021   BUN 14 07/02/2021   NA 132 (L) 07/02/2021   K 4.0 07/02/2021   CL 105 07/02/2021   CO2 19 (L) 07/02/2021    Lab Results  Component Value Date   ALT 50 (H) 06/29/2021   AST 40 06/29/2021   ALKPHOS 167 (H) 06/29/2021   BILITOT 1.2 06/29/2021     Microbiology: Recent Results (from the past 240 hour(s))  Blood culture (routine x 2)     Status: Abnormal   Collection Time: 06/29/21  9:54 AM   Specimen: BLOOD RIGHT HAND  Result Value Ref Range Status   Specimen Description  BLOOD RIGHT HAND  Final   Special Requests   Final    BOTTLES DRAWN AEROBIC ONLY Blood Culture results may not be optimal due to an inadequate volume of blood received in culture bottles   Culture  Setup Time   Final    GRAM POSITIVE COCCI AEROBIC BOTTLE ONLY CRITICAL RESULT CALLED TO, READ BACK BY AND VERIFIED WITH: G,BARR PHARMD @1338  06/30/21 EB Performed at [facility], [place] [address], [place], [place] [address]    Culture METHICILLIN RESISTANT STAPHYLOCOCCUS AUREUS (A)  Final   Report Status 07/02/2021 FINAL  Final   Organism ID, Bacteria METHICILLIN RESISTANT STAPHYLOCOCCUS AUREUS  Final      Susceptibility   Methicillin resistant staphylococcus aureus - MIC*    CIPROFLOXACIN <=0.5 SENSITIVE Sensitive     ERYTHROMYCIN >=8 RESISTANT Resistant     GENTAMICIN <=0.5 SENSITIVE Sensitive     OXACILLIN >=4 RESISTANT Resistant     TETRACYCLINE <=1 SENSITIVE Sensitive     VANCOMYCIN 1 SENSITIVE Sensitive     TRIMETH/SULFA <=10 SENSITIVE Sensitive     CLINDAMYCIN <=0.25 SENSITIVE Sensitive     RIFAMPIN <=0.5 SENSITIVE Sensitive     Inducible Clindamycin NEGATIVE Sensitive     * METHICILLIN RESISTANT STAPHYLOCOCCUS AUREUS  Blood Culture ID Panel (Reflexed)     Status: Abnormal   Collection Time: 06/29/21  9:54 AM  Result Value Ref Range Status   Enterococcus faecalis NOT DETECTED NOT DETECTED Final   Enterococcus Faecium NOT DETECTED NOT DETECTED Final   Listeria monocytogenes NOT DETECTED NOT DETECTED Final   Staphylococcus species DETECTED (A) NOT DETECTED Final    Comment: CRITICAL RESULT CALLED TO, READ BACK BY AND VERIFIED WITH: G,BARR PHARMD @1338  06/30/21 EB    Staphylococcus aureus (BCID) DETECTED (A) NOT DETECTED Final    Comment: Methicillin (oxacillin)-resistant Staphylococcus aureus (MRSA). MRSA is predictably resistant to beta-lactam antibiotics (except ceftaroline). Preferred therapy is vancomycin unless clinically contraindicated. Patient requires contact  precautions if  hospitalized. CRITICAL RESULT CALLED TO, READ BACK BY AND VERIFIED WITH: G,BARR PHARMD @1338  06/30/21 EB    Staphylococcus epidermidis NOT DETECTED NOT DETECTED Final   Staphylococcus lugdunensis NOT DETECTED NOT DETECTED Final   Streptococcus species NOT DETECTED NOT DETECTED Final   Streptococcus agalactiae NOT DETECTED NOT DETECTED Final   Streptococcus pneumoniae NOT DETECTED NOT DETECTED Final   Streptococcus pyogenes NOT DETECTED NOT DETECTED Final   A.calcoaceticus-baumannii NOT DETECTED NOT DETECTED Final   Bacteroides fragilis NOT DETECTED NOT DETECTED Final   Enterobacterales NOT DETECTED NOT DETECTED Final   Enterobacter cloacae complex NOT DETECTED NOT DETECTED Final   Escherichia coli  NOT DETECTED NOT DETECTED Final   Klebsiella aerogenes NOT DETECTED NOT DETECTED Final   Klebsiella oxytoca NOT DETECTED NOT DETECTED Final   Klebsiella pneumoniae NOT DETECTED NOT DETECTED Final   Proteus species NOT DETECTED NOT DETECTED Final   Salmonella species NOT DETECTED NOT DETECTED Final   Serratia marcescens NOT DETECTED NOT DETECTED Final   Haemophilus influenzae NOT DETECTED NOT DETECTED Final   Neisseria meningitidis NOT DETECTED NOT DETECTED Final   Pseudomonas aeruginosa NOT DETECTED NOT DETECTED Final   Stenotrophomonas maltophilia NOT DETECTED NOT DETECTED Final   Candida albicans NOT DETECTED NOT DETECTED Final   Candida auris NOT DETECTED NOT DETECTED Final   Candida glabrata NOT DETECTED NOT DETECTED Final   Candida krusei NOT DETECTED NOT DETECTED Final   Candida parapsilosis NOT DETECTED NOT DETECTED Final   Candida tropicalis NOT DETECTED NOT DETECTED Final   Cryptococcus neoformans/gattii NOT DETECTED NOT DETECTED Final   Meth resistant mecA/C and MREJ DETECTED (A) NOT DETECTED Final    Comment: CRITICAL RESULT CALLED TO, READ BACK BY AND VERIFIED WITH: G,BARR PHARMD @1338  06/30/21 EB Performed at [facility], [address]  [place], [place] [address]   Resp Panel by RT-PCR (Flu A&B, Covid) Nasopharyngeal Swab     Status: None   Collection Time: 06/29/21  9:55 AM   Specimen: Nasopharyngeal Swab; Nasopharyngeal(NP) swabs in vial transport medium  Result Value Ref Range Status   SARS Coronavirus 2 by RT PCR NEGATIVE NEGATIVE Final    Comment: (NOTE) SARS-CoV-2 target nucleic acids are NOT DETECTED.  The SARS-CoV-2 RNA is generally detectable in upper respiratory specimens during the acute phase of infection. The lowest concentration of SARS-CoV-2 viral copies this assay can detect is 138 copies/mL. A negative result does not preclude SARS-Cov-2 infection and should not be used as the sole basis for treatment or other patient management decisions. A negative result may occur with  improper specimen collection/handling, submission of specimen other than nasopharyngeal swab, presence of viral mutation(s) within the areas targeted by this assay, and inadequate number of viral copies(<138 copies/mL). A negative result must be combined with clinical observations, patient history, and epidemiological information. The expected result is Negative.  Fact Sheet for Patients:  EntrepreneurPulse.com.au  Fact Sheet for Healthcare Providers:  IncredibleEmployment.be  This test is no t yet approved or cleared by the [place] FDA and  has been authorized for detection and/or diagnosis of SARS-CoV-2 by FDA under an Emergency Use Authorization (EUA). This EUA will remain  in effect (meaning this test can be used) for the duration of the COVID-19 declaration under Section 564(b)(1) of the Act, 21 U.S.C.section 360bbb-3(b)(1), unless the authorization is terminated  or revoked sooner.       Influenza A by PCR NEGATIVE NEGATIVE Final   Influenza B by PCR NEGATIVE NEGATIVE Final    Comment: (NOTE) The Xpert Xpress SARS-CoV-2/FLU/RSV plus assay is intended as an aid in the diagnosis of  influenza from Nasopharyngeal swab specimens and should not be used as a sole basis for treatment. Nasal washings and aspirates are unacceptable for Xpert Xpress SARS-CoV-2/FLU/RSV testing.  Fact Sheet for Patients: EntrepreneurPulse.com.au  Fact Sheet for Healthcare Providers: IncredibleEmployment.be  This test is not yet approved or cleared by the [place] FDA and has been authorized for detection and/or diagnosis of SARS-CoV-2 by FDA under an Emergency Use Authorization (EUA). This EUA will remain in effect (meaning this test can be used) for the duration of the COVID-19 declaration under Section  564(b)(1) of the Act, 21 U.S.C. section 360bbb-3(b)(1), unless the authorization is terminated or revoked.  Performed at [facility], [place] [address] [place], [place] [address]   Blood culture (routine x 2)     Status: Abnormal   Collection Time: 06/29/21  3:15 PM   Specimen: BLOOD  Result Value Ref Range Status   Specimen Description BLOOD BLOOD LEFT FOREARM  Final   Special Requests   Final    BOTTLES DRAWN AEROBIC AND ANAEROBIC Blood Culture adequate volume   Culture  Setup Time   Final    GRAM POSITIVE COCCI ANAEROBIC BOTTLE ONLY CRITICAL VALUE NOTED.  VALUE IS CONSISTENT WITH PREVIOUSLY REPORTED AND CALLED VALUE.    Culture (A)  Final    STAPHYLOCOCCUS AUREUS SUSCEPTIBILITIES PERFORMED ON PREVIOUS CULTURE WITHIN THE LAST 5 DAYS. Performed at [facility], [place] [address] [place], [place] [address]    Report Status 07/03/2021 FINAL  Final  Culture, blood (routine x 2)     Status: Abnormal   Collection Time: 06/30/21  6:50 PM   Specimen: BLOOD  Result Value Ref Range Status   Specimen Description BLOOD LEFT ANTECUBITAL  Final   Special Requests   Final    BOTTLES DRAWN AEROBIC ONLY Blood Culture results may not be optimal due to an inadequate volume of blood received in culture bottles   Culture  Setup Time   Final     GRAM POSITIVE COCCI IN CLUSTERS AEROBIC BOTTLE ONLY CRITICAL VALUE NOTED.  VALUE IS CONSISTENT WITH PREVIOUSLY REPORTED AND CALLED VALUE.    Culture (A)  Final    STAPHYLOCOCCUS AUREUS SUSCEPTIBILITIES PERFORMED ON PREVIOUS CULTURE WITHIN THE LAST 5 DAYS. Performed at [facility], [place] [address] [place], [place] [address]    Report Status 07/02/2021 FINAL  Final  Culture, blood (routine x 2)     Status: Abnormal   Collection Time: 06/30/21  6:55 PM   Specimen: BLOOD  Result Value Ref Range Status   Specimen Description BLOOD LEFT ANTECUBITAL  Final   Special Requests   Final    BOTTLES DRAWN AEROBIC ONLY Blood Culture results may not be optimal due to an inadequate volume of blood received in culture bottles   Culture  Setup Time   Final    GRAM POSITIVE COCCI IN CLUSTERS AEROBIC BOTTLE ONLY CRITICAL VALUE NOTED.  VALUE IS CONSISTENT WITH PREVIOUSLY REPORTED AND CALLED VALUE.    Culture (A)  Final    STAPHYLOCOCCUS AUREUS SUSCEPTIBILITIES PERFORMED ON PREVIOUS CULTURE WITHIN THE LAST 5 DAYS. Performed at [facility], [place] [address], [place], [place] [address]    Report Status 07/02/2021 FINAL  Final  Culture, blood (routine x 2)     Status: Abnormal (Preliminary result)   Collection Time: 07/02/21  8:50 AM   Specimen: BLOOD  Result Value Ref Range Status   Specimen Description BLOOD SITE NOT SPECIFIED  Final   Special Requests IN PEDIATRIC BOTTLE Blood Culture adequate volume  Final   Culture  Setup Time   Final    GRAM POSITIVE COCCI AEROBIC BOTTLE ONLY CRITICAL VALUE NOTED.  VALUE IS CONSISTENT WITH PREVIOUSLY REPORTED AND CALLED VALUE.    Culture (A)  Final    STAPHYLOCOCCUS AUREUS Sent to [place] for further susceptibility testing. Performed at [facility], [place] [address], [place], [place] [address]    Report Status PENDING  Incomplete  Culture, blood (routine x 2)     Status: None (Preliminary  result)   Collection Time: 07/02/21 11:50 AM    Specimen: BLOOD RIGHT FOREARM  Result Value Ref Range Status   Specimen Description BLOOD RIGHT FOREARM  Final   Special Requests   Final    BOTTLES DRAWN AEROBIC AND ANAEROBIC Blood Culture adequate volume   Culture   Final    NO GROWTH < 24 HOURS Performed at [facility], [address], [place], [place] [address]    Report Status PENDING  Incomplete     Terri Piedra, [facility]  07/03/2021  3:59 PM

## 2021-07-03 NOTE — Interval H&P Note (Signed)
History and Physical Interval Note:  07/03/2021 1:16 PM  Yvette Gaines  has presented today for surgery, with the diagnosis of BACTEREMIA.  The various methods of treatment have been discussed with the patient and family. After consideration of risks, benefits and other options for treatment, the patient has consented to  Procedure(s): TRANSESOPHAGEAL ECHOCARDIOGRAM (TEE) (N/A) as a surgical intervention.  The patient's history has been reviewed, patient examined, no change in status, stable for surgery.  I have reviewed the patient's chart and labs.  Questions were answered to the patient's satisfaction.     Elya Tarquinio

## 2021-07-04 DIAGNOSIS — L02419 Cutaneous abscess of limb, unspecified: Secondary | ICD-10-CM | POA: Diagnosis not present

## 2021-07-04 DIAGNOSIS — I361 Nonrheumatic tricuspid (valve) insufficiency: Secondary | ICD-10-CM

## 2021-07-04 DIAGNOSIS — B9562 Methicillin resistant Staphylococcus aureus infection as the cause of diseases classified elsewhere: Secondary | ICD-10-CM | POA: Diagnosis not present

## 2021-07-04 DIAGNOSIS — R7881 Bacteremia: Secondary | ICD-10-CM | POA: Diagnosis not present

## 2021-07-04 DIAGNOSIS — I339 Acute and subacute endocarditis, unspecified: Secondary | ICD-10-CM

## 2021-07-04 DIAGNOSIS — B182 Chronic viral hepatitis C: Secondary | ICD-10-CM | POA: Diagnosis not present

## 2021-07-04 DIAGNOSIS — I330 Acute and subacute infective endocarditis: Secondary | ICD-10-CM | POA: Diagnosis not present

## 2021-07-04 LAB — CBC
HCT: 24 % — ABNORMAL LOW (ref 36.0–46.0)
Hemoglobin: 7.8 g/dL — ABNORMAL LOW (ref 12.0–15.0)
MCH: 28.5 pg (ref 26.0–34.0)
MCHC: 32.5 g/dL (ref 30.0–36.0)
MCV: 87.6 fL (ref 80.0–100.0)
Platelets: 548 10*3/uL — ABNORMAL HIGH (ref 150–400)
RBC: 2.74 MIL/uL — ABNORMAL LOW (ref 3.87–5.11)
RDW: 19.5 % — ABNORMAL HIGH (ref 11.5–15.5)
WBC: 10.5 10*3/uL (ref 4.0–10.5)
nRBC: 0 % (ref 0.0–0.2)

## 2021-07-04 LAB — BASIC METABOLIC PANEL
Anion gap: 6 (ref 5–15)
BUN: 13 mg/dL (ref 6–20)
CO2: 21 mmol/L — ABNORMAL LOW (ref 22–32)
Calcium: 7.7 mg/dL — ABNORMAL LOW (ref 8.9–10.3)
Chloride: 107 mmol/L (ref 98–111)
Creatinine, Ser: 0.62 mg/dL (ref 0.44–1.00)
GFR, Estimated: >60 mL/min (ref >60)
Glucose, Bld: 87 mg/dL (ref 70–99)
Potassium: 4.1 mmol/L (ref 3.5–5.1)
Sodium: 134 mmol/L — ABNORMAL LOW (ref 135–145)

## 2021-07-04 LAB — HEPATITIS B SURFACE ANTIBODY, QUANTITATIVE: Hep B S AB Quant (Post): 83.7 m[IU]/mL (ref >9.9)

## 2021-07-04 MED ORDER — ENSURE ENLIVE PO LIQD
237.0000 mL | Freq: Three times a day (TID) | ORAL | Status: DC
  Administered 2021-07-04 – 2021-08-19 (×104): 237 mL via ORAL
  Filled 2021-07-04: qty 237

## 2021-07-04 MED ORDER — ADULT MULTIVITAMIN W/MINERALS CH
1.0000 | ORAL_TABLET | Freq: Every day | ORAL | Status: DC
  Administered 2021-07-04 – 2021-08-19 (×47): 1 via ORAL
  Filled 2021-07-04 (×47): qty 1

## 2021-07-04 NOTE — Progress Notes (Signed)
PROGRESS NOTE    Yvette Gaines  DQQ:[id number] DOB: Feb 24, 1971 DOA: 06/29/2021 PCP: [name], DO    Chief Complaint  Patient presents with   Chest Pain   Weakness   Shortness of Breath   Drug Problem    Brief Narrative:  Yvette Gaines is a 50 y.o. 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   Patient presented to [facility] on 10/05 for worsening of left shoulder swelling and pain.  Was found to have abscess of left shoulder and left calf,  I&D was done on 10/06 and culture showed MRSA.[name]  Patient however signed out [place] after the surgery. On 10/16, patient came back to [facility] complaining about new onset of chest pains and shortness of breath, CT chest showed multiple bilateral septic emboli, echocardiogram showed new onset of tricuspid vegetation compatible with endocarditis.  Blood culture showed again MRSA.  Patient was started on vancomycin and cefepime since. During hospital stay, patient was also found to develop acute thrombocytopenia, DIC was ruled out.  Patient was admitted to [facility], and started on IV vancomycin, TEE with tricuspid leaflet vegetation.     Assessment & Plan:   Principal Problem:   MRSA bacteremia Active Problems:   Severe opioid use disorder ([place])   Endocarditis   Septic pulmonary embolism (HCC)   Chronic viral hepatitis C (HCC)   Shoulder abscess   Calf abscess   Odynophagia   MRSA bacteremia/MRSA TV endocarditis/septic emboli/recent right shoulder and right calf abscess status post I&D 10/6 at [facility] in a patient with IV drug use. -Blood cultures positive for MRSA on 10/21, 10/22.   -Blood cultures no growth so far from 10/24 /10/25.   -TEE consistent with tricuspid valve endocarditis.  Will be seen by CT  surgery for possible intervention /angio vac.   -Not a candidate to go home with PICC line, last drug use a week ago.   May stay in the hospital to complete antibiotic therapy.   Thrombocytopenia: Resolved.   Recent right shoulder abscess -Status post I&D on 10/06 at [facility], according to records and imaging study and orthopedic note, there was no infection on [facility] joint itself, and patient recovered well. No acute issue now.   Moderate protein calorie malnutrition -Start protein supplement   Mild intermittent asthma -At baseline.  As needed bronchodilator.   Cigarette smoking -Nicotine patch.  Hypokalemia, resolved.  IV drug abuse -She wants to quit.  Started on Suboxone 1.5 mg daily.  Currently no evidence of withdrawals.  Normocytic anemia -This is most likely anemia of acute illness/anemia of chronic disease, anemia panel with normal J94, folic acid, and ferritin level, iron level is low. -Transfuse for hemoglobin less than 7.     +HCV AB -Quantitative RNA pending.   DVT prophylaxis: Heparin Code Status: Full Family Communication: None at bedside Disposition:   Status is: Inpatient  Remains inpatient appropriate because: Bacteremia with need for IV antibiotics       Consultants:  ID CT surgery   Subjective:  Patient seen and examined.  Without any complaints today.  Afebrile overnight.  Objective: Vitals:   07/03/21 1644 07/03/21 2102 07/04/21 0532 07/04/21 0858  BP: 119/71 137/85 120/77 115/74  Pulse: 93 82 79 85  Resp: 16 18 17 14   Temp: 98.1 F (36.7 C) 98.4 F (36.9  C) 98.5 F (36.9 C) 98.2 F (36.8 C)  TempSrc: Oral Oral Oral Oral  SpO2: 100% 100% 99% 100%  Weight:      Height:        Intake/Output Summary (Last 24 hours) at 07/04/2021 1328 Last data filed at 07/04/2021 1227 Gross per 24 hour  Intake 950 ml  Output --  Net 950 ml    Filed Weights   06/29/21 1100 07/03/21 1237  Weight: 60.3 kg 59 kg    Examination:  General:  Looks comfortable on room air.  Not in any distress. Cardiovascular: S1-S2 normal.  Regular rate rhythm.  Respiratory: Bilateral clear.  No added sounds. Gastrointestinal: Soft and nontender.  Bowel sounds present. Ext: No swelling or edema.  No joint effusion.   Data Reviewed: I have personally reviewed following labs and imaging studies  CBC: Recent Labs  Lab 06/29/21 0924 06/30/21 0127 07/01/21 0155 07/02/21 0101 07/04/21 0135  WBC 16.9* 16.3* 12.0* 9.2 10.5  HGB 8.5* 7.6* 7.1* 8.1* 7.8*  HCT 27.4* 23.2* 21.8* 25.3* 24.0*  MCV 90.1 84.4 84.8 87.2 87.6  PLT 336 404* 402* 502* 548*    Basic Metabolic Panel: Recent Labs  Lab 06/29/21 0924 06/30/21 0127 07/01/21 0155 07/02/21 0101 07/04/21 0135  NA 131* 130* 129* 132* 134*  K 3.7 3.2* 3.7 4.0 4.1  CL 103 103 102 105 107  CO2 17* 19* 19* 19* 21*  GLUCOSE 98 99 132* 98 87  BUN 13 11 13 14 13   CREATININE 0.61 0.65 0.66 0.59 0.62  CALCIUM 7.7* 7.3* 7.4* 7.4* 7.7*  MG  --   --  1.9  --   --   PHOS  --   --  2.6 4.0  --     GFR: Estimated Creatinine Clearance: 66.5 mL/min (by C-G formula based on SCr of 0.62 mg/dL).  Liver Function Tests: Recent Labs  Lab 06/29/21 0924  AST 40  ALT 50*  ALKPHOS 167*  BILITOT 1.2  PROT 6.0*  ALBUMIN 1.8*    CBG: Recent Labs  Lab 07/03/21 2059  GLUCAP 117*     Recent Results (from the past 240 hour(s))  Blood culture (routine x 2)     Status: Abnormal   Collection Time: 06/29/21  9:54 AM   Specimen: BLOOD RIGHT HAND  Result Value Ref Range Status   Specimen Description BLOOD RIGHT HAND  Final   Special Requests   Final    BOTTLES DRAWN AEROBIC ONLY Blood Culture results may not be optimal due to an inadequate volume of blood received in culture bottles   Culture  Setup Time   Final    GRAM POSITIVE COCCI AEROBIC BOTTLE ONLY CRITICAL RESULT CALLED TO, READ BACK BY AND VERIFIED WITH: G,BARR PHARMD @1338  06/30/21 EB Performed at [facility], [place] [address], [place], [place] [address]    Culture METHICILLIN RESISTANT STAPHYLOCOCCUS AUREUS (A)  Final   Report Status 07/02/2021 FINAL  Final   Organism ID, Bacteria METHICILLIN RESISTANT STAPHYLOCOCCUS AUREUS  Final      Susceptibility   Methicillin resistant staphylococcus aureus - MIC*    CIPROFLOXACIN <=0.5 SENSITIVE Sensitive     ERYTHROMYCIN >=8 RESISTANT Resistant     GENTAMICIN <=0.5 SENSITIVE Sensitive     OXACILLIN >=4 RESISTANT Resistant     TETRACYCLINE <=1 SENSITIVE Sensitive     VANCOMYCIN 1 SENSITIVE Sensitive     TRIMETH/SULFA <=10 SENSITIVE Sensitive     CLINDAMYCIN <=0.25 SENSITIVE Sensitive  RIFAMPIN <=0.5 SENSITIVE Sensitive     Inducible Clindamycin NEGATIVE Sensitive     * METHICILLIN RESISTANT STAPHYLOCOCCUS AUREUS  Blood Culture ID Panel (Reflexed)     Status: Abnormal   Collection Time: 06/29/21  9:54 AM  Result Value Ref Range Status   Enterococcus faecalis NOT DETECTED NOT DETECTED Final   Enterococcus Faecium NOT DETECTED NOT DETECTED Final   Listeria monocytogenes NOT DETECTED NOT DETECTED Final   Staphylococcus species DETECTED (A) NOT DETECTED Final    Comment: CRITICAL RESULT CALLED TO, READ BACK BY AND VERIFIED WITH: G,BARR PHARMD @1338  06/30/21 EB    Staphylococcus aureus (BCID) DETECTED (A) NOT DETECTED Final    Comment: Methicillin (oxacillin)-resistant Staphylococcus aureus (MRSA). MRSA is predictably resistant to beta-lactam antibiotics (except ceftaroline). Preferred therapy is vancomycin unless clinically contraindicated. Patient requires contact precautions if  hospitalized. CRITICAL RESULT CALLED TO, READ BACK BY AND VERIFIED WITH: G,BARR PHARMD @1338  06/30/21 EB    Staphylococcus epidermidis NOT DETECTED NOT DETECTED Final   Staphylococcus lugdunensis NOT DETECTED NOT DETECTED Final   Streptococcus species NOT DETECTED NOT DETECTED Final   Streptococcus agalactiae NOT DETECTED NOT DETECTED Final   Streptococcus pneumoniae NOT DETECTED NOT  DETECTED Final   Streptococcus pyogenes NOT DETECTED NOT DETECTED Final   A.calcoaceticus-baumannii NOT DETECTED NOT DETECTED Final   Bacteroides fragilis NOT DETECTED NOT DETECTED Final   Enterobacterales NOT DETECTED NOT DETECTED Final   Enterobacter cloacae complex NOT DETECTED NOT DETECTED Final   Escherichia coli NOT DETECTED NOT DETECTED Final   Klebsiella aerogenes NOT DETECTED NOT DETECTED Final   Klebsiella oxytoca NOT DETECTED NOT DETECTED Final   Klebsiella pneumoniae NOT DETECTED NOT DETECTED Final   Proteus species NOT DETECTED NOT DETECTED Final   Salmonella species NOT DETECTED NOT DETECTED Final   Serratia marcescens NOT DETECTED NOT DETECTED Final   Haemophilus influenzae NOT DETECTED NOT DETECTED Final   Neisseria meningitidis NOT DETECTED NOT DETECTED Final   Pseudomonas aeruginosa NOT DETECTED NOT DETECTED Final   Stenotrophomonas maltophilia NOT DETECTED NOT DETECTED Final   Candida albicans NOT DETECTED NOT DETECTED Final   Candida auris NOT DETECTED NOT DETECTED Final   Candida glabrata NOT DETECTED NOT DETECTED Final   Candida krusei NOT DETECTED NOT DETECTED Final   Candida parapsilosis NOT DETECTED NOT DETECTED Final   Candida tropicalis NOT DETECTED NOT DETECTED Final   Cryptococcus neoformans/gattii NOT DETECTED NOT DETECTED Final   Meth resistant mecA/C and MREJ DETECTED (A) NOT DETECTED Final    Comment: CRITICAL RESULT CALLED TO, READ BACK BY AND VERIFIED WITH: G,BARR PHARMD @1338  06/30/21 EB Performed at [facility], [address], [place], [place] [address]   Resp Panel by RT-PCR (Flu A&B, Covid) Nasopharyngeal Swab     Status: None   Collection Time: 06/29/21  9:55 AM   Specimen: Nasopharyngeal Swab; Nasopharyngeal(NP) swabs in vial transport medium  Result Value Ref Range Status   SARS Coronavirus 2 by RT PCR NEGATIVE NEGATIVE Final    Comment: (NOTE) SARS-CoV-2 target nucleic acids are NOT DETECTED.  The SARS-CoV-2 RNA is generally  detectable in upper respiratory specimens during the acute phase of infection. The lowest concentration of SARS-CoV-2 viral copies this assay can detect is 138 copies/mL. A negative result does not preclude SARS-Cov-2 infection and should not be used as the sole basis for treatment or other patient management decisions. A negative result may occur with  improper specimen collection/handling, submission of specimen other than nasopharyngeal swab, presence of viral mutation(s) within  the areas targeted by this assay, and inadequate number of viral copies(<138 copies/mL). A negative result must be combined with clinical observations, patient history, and epidemiological information. The expected result is Negative.  Fact Sheet for Patients:  EntrepreneurPulse.com.au  Fact Sheet for Healthcare Providers:  IncredibleEmployment.be  This test is no t yet approved or cleared by the [place] FDA and  has been authorized for detection and/or diagnosis of SARS-CoV-2 by FDA under an Emergency Use Authorization (EUA). This EUA will remain  in effect (meaning this test can be used) for the duration of the COVID-19 declaration under Section 564(b)(1) of the Act, 21 U.S.C.section 360bbb-3(b)(1), unless the authorization is terminated  or revoked sooner.       Influenza A by PCR NEGATIVE NEGATIVE Final   Influenza B by PCR NEGATIVE NEGATIVE Final    Comment: (NOTE) The Xpert Xpress SARS-CoV-2/FLU/RSV plus assay is intended as an aid in the diagnosis of influenza from Nasopharyngeal swab specimens and should not be used as a sole basis for treatment. Nasal washings and aspirates are unacceptable for Xpert Xpress SARS-CoV-2/FLU/RSV testing.  Fact Sheet for Patients: EntrepreneurPulse.com.au  Fact Sheet for Healthcare Providers: IncredibleEmployment.be  This test is not yet approved or cleared by the [place] FDA  and has been authorized for detection and/or diagnosis of SARS-CoV-2 by FDA under an Emergency Use Authorization (EUA). This EUA will remain in effect (meaning this test can be used) for the duration of the COVID-19 declaration under Section 564(b)(1) of the Act, 21 U.S.C. section 360bbb-3(b)(1), unless the authorization is terminated or revoked.  Performed at [facility], [place] [address], [place], [place] [address]   Blood culture (routine x 2)     Status: Abnormal   Collection Time: 06/29/21  3:15 PM   Specimen: BLOOD  Result Value Ref Range Status   Specimen Description BLOOD BLOOD LEFT FOREARM  Final   Special Requests   Final    BOTTLES DRAWN AEROBIC AND ANAEROBIC Blood Culture adequate volume   Culture  Setup Time   Final    GRAM POSITIVE COCCI ANAEROBIC BOTTLE ONLY CRITICAL VALUE NOTED.  VALUE IS CONSISTENT WITH PREVIOUSLY REPORTED AND CALLED VALUE.    Culture (A)  Final    STAPHYLOCOCCUS AUREUS SUSCEPTIBILITIES PERFORMED ON PREVIOUS CULTURE WITHIN THE LAST 5 DAYS. Performed at [facility], [place] [address], [place], [place] [address]    Report Status 07/03/2021 FINAL  Final  Culture, blood (routine x 2)     Status: Abnormal   Collection Time: 06/30/21  6:50 PM   Specimen: BLOOD  Result Value Ref Range Status   Specimen Description BLOOD LEFT ANTECUBITAL  Final   Special Requests   Final    BOTTLES DRAWN AEROBIC ONLY Blood Culture results may not be optimal due to an inadequate volume of blood received in culture bottles   Culture  Setup Time   Final    GRAM POSITIVE COCCI IN CLUSTERS AEROBIC BOTTLE ONLY CRITICAL VALUE NOTED.  VALUE IS CONSISTENT WITH PREVIOUSLY REPORTED AND CALLED VALUE.    Culture (A)  Final    STAPHYLOCOCCUS AUREUS SUSCEPTIBILITIES PERFORMED ON PREVIOUS CULTURE WITHIN THE LAST 5 DAYS. Performed at [facility], [place] [address] [place], [place] [address]    Report Status 07/02/2021 FINAL  Final  Culture, blood (routine x 2)      Status: Abnormal   Collection Time: 06/30/21  6:55 PM   Specimen: BLOOD  Result Value Ref Range Status  Specimen Description BLOOD LEFT ANTECUBITAL  Final   Special Requests   Final    BOTTLES DRAWN AEROBIC ONLY Blood Culture results may not be optimal due to an inadequate volume of blood received in culture bottles   Culture  Setup Time   Final    GRAM POSITIVE COCCI IN CLUSTERS AEROBIC BOTTLE ONLY CRITICAL VALUE NOTED.  VALUE IS CONSISTENT WITH PREVIOUSLY REPORTED AND CALLED VALUE.    Culture (A)  Final    STAPHYLOCOCCUS AUREUS SUSCEPTIBILITIES PERFORMED ON PREVIOUS CULTURE WITHIN THE LAST 5 DAYS. Performed at [facility], [place] [address] [place], [place] [address]    Report Status 07/02/2021 FINAL  Final  Culture, blood (routine x 2)     Status: Abnormal (Preliminary result)   Collection Time: 07/02/21  8:50 AM   Specimen: BLOOD  Result Value Ref Range Status   Specimen Description BLOOD SITE NOT SPECIFIED  Final   Special Requests IN PEDIATRIC BOTTLE Blood Culture adequate volume  Final   Culture  Setup Time   Final    GRAM POSITIVE COCCI AEROBIC BOTTLE ONLY CRITICAL VALUE NOTED.  VALUE IS CONSISTENT WITH PREVIOUSLY REPORTED AND CALLED VALUE.    Culture (A)  Final    STAPHYLOCOCCUS AUREUS Sent to [facility] for further susceptibility testing. Performed at [facility], [place] [address], [place], [place] [address]    Report Status PENDING  Incomplete  Culture, blood (routine x 2)     Status: None (Preliminary result)   Collection Time: 07/02/21 11:50 AM   Specimen: BLOOD RIGHT FOREARM  Result Value Ref Range Status   Specimen Description BLOOD RIGHT FOREARM  Final   Special Requests   Final    BOTTLES DRAWN AEROBIC AND ANAEROBIC Blood Culture adequate volume   Culture   Final    NO GROWTH 2 DAYS Performed at [facility], [place] [address] [place], [place] [address]    Report Status PENDING  Incomplete  Culture, blood (routine x 2)     Status: None  (Preliminary result)   Collection Time: 07/03/21  8:06 PM   Specimen: BLOOD  Result Value Ref Range Status   Specimen Description BLOOD RIGHT ARM  Final   Special Requests   Final    BOTTLES DRAWN AEROBIC AND ANAEROBIC Blood Culture adequate volume   Culture   Final    NO GROWTH < 12 HOURS Performed at [facility], [place] [address], [place], [place] [address]    Report Status PENDING  Incomplete  Culture, blood (routine x 2)     Status: None (Preliminary result)   Collection Time: 07/03/21  8:06 PM   Specimen: BLOOD  Result Value Ref Range Status   Specimen Description BLOOD LEFT ARM  Final   Special Requests   Final    BOTTLES DRAWN AEROBIC AND ANAEROBIC Blood Culture adequate volume   Culture   Final    NO GROWTH < 12 HOURS Performed at [facility], [place] [address], [place], [place] [address]    Report Status PENDING  Incomplete         Radiology Studies: ECHO TEE  Result Date: 07/03/2021    TRANSESOPHOGEAL ECHO REPORT   Patient Name:   Yvette Gaines Date of Exam: 07/03/2021 Medical Rec #:  [id number]    Height:       62.0 in Accession #:    [id number]   Weight:       130.0 lb Date of Birth:  1971-08-13    BSA:          1.592 m Patient Age:    50 years     BP:           118/78 mmHg Patient Gender: F            HR:           61 bpm. Exam Location:  Inpatient Procedure: 2D Echo, Color Doppler, Cardiac Doppler and 3D Echo Indications:    Bacteremia  History:        Patient has no prior history of Echocardiogram examinations.                 IVDU.  Sonographer:    Merrie Roof RDCS Referring Phys: [id number] HAO MENG Diagnosing      Kardie Tobb DO Phys: PROCEDURE: The transesophogeal probe was passed without difficulty through the esophogus of the patient. Local oropharyngeal anesthetic was provided with Cetacaine. Sedation performed by different physician. The patient was monitored while under deep sedation. The patient's vital signs; including heart rate, blood pressure, and  oxygen saturation; remained stable throughout the procedure. The patient developed no complications during the procedure. IMPRESSIONS  1. Left ventricular ejection fraction, by estimation, is 55 to 60%. The left ventricle has normal function.  2. Right ventricular systolic function is low normal. The right ventricular size is normal.  3. No left atrial/left atrial appendage thrombus was detected.  4. The mitral valve is normal in structure. Trivial mitral valve regurgitation.  5. There is a highly mobile echodense mass on the anterior leaflet of the tricuspid valve measuring 1.35 cm x 0.582 cm. The valve leaftets appears to be intact with no appreciation of a perforation. Tricuspid valve regurgitation is moderate.  6. The aortic valve is tricuspid. Aortic valve regurgitation is not visualized. FINDINGS  Left Ventricle: Left ventricular ejection fraction, by estimation, is 55 to 60%. The left ventricle has normal function. The left ventricular internal cavity size was normal in size. Right Ventricle: The right ventricular size is normal. No increase in right ventricular wall thickness. Right ventricular systolic function is low normal. Left Atrium: Left atrial size was normal in size. No left atrial/left atrial appendage thrombus was detected. Right Atrium: Right atrial size was normal in size. Pericardium: There is no evidence of pericardial effusion. Mitral Valve: The mitral valve is normal in structure. Trivial mitral valve regurgitation. Tricuspid Valve: There is a highly mobile echodense mass on the anterior leaflet of the tricuspid valve measuring 1.35 cm x 0.582 cm. The valve leaftets appears to be intact with no appreciation of a perforation or tear. The tricuspid valve is abnormal. Tricuspid valve regurgitation is moderate . No evidence of tricuspid stenosis. Aortic Valve: The aortic valve is tricuspid. Aortic valve regurgitation is not visualized. Pulmonic Valve: The pulmonic valve was not well visualized.  Pulmonic valve regurgitation is trivial. Aorta: Aortic root could not be assessed. There is minimal (Grade I) layered plaque involving the descending aorta. Venous: The left upper pulmonary vein and left lower pulmonary vein are normal. IAS/Shunts: No atrial level shunt detected by color flow Doppler.  TRICUSPID VALVE TR Peak grad:   25.4 mmHg TR Vmax:        252.00 cm/s Kardie Tobb DO Electronically signed by Berniece Salines DO Signature Date/Time: 07/03/2021/3:48:29 PM    Final         Scheduled Meds:  buprenorphine-naloxone  1.5 tablet Sublingual Daily   cloNIDine  0.1 mg Oral QAC breakfast  enoxaparin (LOVENOX) injection  40 mg Subcutaneous Q24H   famotidine  20 mg Oral Daily   feeding supplement  237 mL Oral BID BM   magic mouthwash  5 mL Oral QID   nicotine  21 mg Transdermal Daily   valACYclovir  1,000 mg Oral BID   Continuous Infusions:  vancomycin 750 mg (07/04/21 1033)     LOS: 5 days   Total time spent: 30 minutes    Barb Merino, MD [facility]  07/04/2021, 1:28 PM

## 2021-07-04 NOTE — Progress Notes (Signed)
This chaplain followed up with the delivery of a Bible and journal.  The chaplain understands the Pt. Is interesting in talking about forgiveness and requested scripture references from the chaplain.    The Pt. is appreciative of the visit and looking forward to spiritual care F/U.

## 2021-07-04 NOTE — Progress Notes (Signed)
This chaplain is present for a F/U spiritual care visit.    The Pt. declines the visit sharing "I don't feel well, please call the RN." The chaplain is appreciative of the RN-Tina's response and the  education in asking clarifying questions of the Pt.  This chaplain is available for F/U spiritual care as needed.  Chaplain Sallyanne Kuster [phone]

## 2021-07-04 NOTE — Progress Notes (Signed)
1 Day Post-Op Procedure(s) (LRB): TRANSESOPHAGEAL ECHOCARDIOGRAM (TEE) (N/A) Subjective: No complaints. Feels better.  Tmax 100.1 today  Objective: Vital signs in last 24 hours: Temp:  [98.2 F (36.8 C)-100.1 F (37.8 C)] 100.1 F (37.8 C) (10/26 1623) Pulse Rate:  [79-103] 103 (10/26 1623) Resp:  [14-18] 16 (10/26 1623) BP: (114-137)/(69-85) 114/69 (10/26 1623) SpO2:  [99 %-100 %] 100 % (10/26 1623)  Hemodynamic parameters for last 24 hours:    Intake/Output from previous day: 10/25 0701 - 10/26 0700 In: 800 [P.O.:200; I.V.:300; IV Piggyback:300] Out: -  Intake/Output this shift: No intake/output data recorded.  General appearance: alert and cooperative Heart: regular rate and rhythm, S1, S2 normal, no murmur Lungs: clear to auscultation bilaterally Extremities: no edema  Lab Results: Recent Labs    07/02/21 0101 07/04/21 0135  WBC 9.2 10.5  HGB 8.1* 7.8*  HCT 25.3* 24.0*  PLT 502* 548*   BMET:  Recent Labs    07/02/21 0101 07/04/21 0135  NA 132* 134*  K 4.0 4.1  CL 105 107  CO2 19* 21*  GLUCOSE 98 87  BUN 14 13  CREATININE 0.59 0.62  CALCIUM 7.4* 7.7*    PT/INR: No results for input(s): LABPROT, INR in the last 72 hours. ABG No results found for: PHART, HCO3, TCO2, ACIDBASEDEF, O2SAT CBG (last 3)  Recent Labs    07/03/21 2059  GLUCAP 117*   TRANSESOPHOGEAL ECHO REPORT         Patient Name:   Yvette Gaines Date of Exam: 07/03/2021  Medical Rec #:  [id number]    Height:       62.0 in  Accession #:    [id number]   Weight:       130.0 lb  Date of Birth:  1970-10-08    BSA:          1.592 m  Patient Age:    50 years     BP:           118/78 mmHg  Patient Gender: F            HR:           61 bpm.  Exam Location:  Inpatient   Procedure: 2D Echo, Color Doppler, Cardiac Doppler and 3D Echo   Indications:    Bacteremia     History:        Patient has no prior history of Echocardiogram  examinations.                  IVDU.     Sonographer:     Merrie Roof RDCS  Referring Phys: [id number] HAO MENG  Diagnosing      Kardie Tobb DO  Phys:   PROCEDURE: The transesophogeal probe was passed without difficulty through  the esophogus of the patient. Local oropharyngeal anesthetic was provided  with Cetacaine. Sedation performed by different physician. The patient was  monitored while under deep  sedation. The patient's vital signs; including heart rate, blood pressure,  and oxygen saturation; remained stable throughout the procedure. The  patient developed no complications during the procedure.   IMPRESSIONS     1. Left ventricular ejection fraction, by estimation, is 55 to 60%. The  left ventricle has normal function.   2. Right ventricular systolic function is low normal. The right  ventricular size is normal.   3. No left atrial/left atrial appendage thrombus was detected.   4. The mitral valve is normal in structure. Trivial mitral valve  regurgitation.  5. There is a highly mobile echodense mass on the anterior leaflet of the  tricuspid valve measuring 1.35 cm x 0.582 cm. The valve leaftets appears  to be intact with no appreciation of a perforation. Tricuspid valve  regurgitation is moderate.   6. The aortic valve is tricuspid. Aortic valve regurgitation is not  visualized.   FINDINGS   Left Ventricle: Left ventricular ejection fraction, by estimation, is 55  to 60%. The left ventricle has normal function. The left ventricular  internal cavity size was normal in size.   Right Ventricle: The right ventricular size is normal. No increase in  right ventricular wall thickness. Right ventricular systolic function is  low normal.   Left Atrium: Left atrial size was normal in size. No left atrial/left  atrial appendage thrombus was detected.   Right Atrium: Right atrial size was normal in size.   Pericardium: There is no evidence of pericardial effusion.   Mitral Valve: The mitral valve is normal in structure. Trivial  mitral  valve regurgitation.   Tricuspid Valve: There is a highly mobile echodense mass on the anterior  leaflet of the tricuspid valve measuring 1.35 cm x 0.582 cm. The valve  leaftets appears to be intact with no appreciation of a perforation or  tear. The tricuspid valve is abnormal.  Tricuspid valve regurgitation is moderate . No evidence of tricuspid  stenosis.   Aortic Valve: The aortic valve is tricuspid. Aortic valve regurgitation is  not visualized.   Pulmonic Valve: The pulmonic valve was not well visualized. Pulmonic valve  regurgitation is trivial.   Aorta: Aortic root could not be assessed. There is minimal (Grade I)  layered plaque involving the descending aorta.   Venous: The left upper pulmonary vein and left lower pulmonary vein are  normal.   IAS/Shunts: No atrial level shunt detected by color flow Doppler.      TRICUSPID VALVE  TR Peak grad:   25.4 mmHg  TR Vmax:        252.00 cm/s   Kardie Tobb DO  Electronically signed by Berniece Salines DO  Signature Date/Time: 07/03/2021/3:48:29 PM         Final     Assessment/Plan:  MRSA tricuspid valve endocarditis. She has been afebrile until low grade fever this afternoon. Her WBC ct has been normal. 1/2 blood cultures from 10/24 positive and both BC from yesterday negative so far. TEE reviewed and there is a 1.35 x  0.5 cm mobile vegetation on the tricuspid valve with moderate TR. There is no indication for surgery at this time and the vegetation is too small for Angiovac treatment. I would continue antibiotic therapy and follow blood cultures and clinical response. We will follow her progress.  LOS: 5 days    Gaye Pollack 07/04/2021

## 2021-07-04 NOTE — Progress Notes (Signed)
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 1 out of 4 bottles from blood cultures on 07/02/21 are positive for Staphylococcus aureus with new cultures from 07/03/21 in process. Discussed plan of care to include need for prolonged course of antibiotics starting once blood cultures have cleared and will likely need to remain in the hospital for duration of treatment due to recent drug use prior to admission. Have requested CVTS to evaluate for potential angiovac intervention. Sensitivities for Daptomycin and Ceftaroline have been sent. Continue current dose of vancomycin. Remaining medical and supportive care per primary team.   PLAN:  Continue current dose of vancomycin. CVTS evaluation for possible angiovac Monitor cultures for clearance of bacteremia. Remaining medical and supportive care per primary team.   Principal Problem:   MRSA bacteremia Active Problems:   Severe opioid use disorder (HCC)   Endocarditis   Septic pulmonary embolism (HCC)   Chronic viral hepatitis C (HCC)   Shoulder abscess   Calf abscess   Odynophagia    buprenorphine-naloxone  1.5 tablet Sublingual Daily   cloNIDine  0.1 mg Oral QAC breakfast   enoxaparin (LOVENOX) injection  40 mg Subcutaneous Q24H   famotidine  20 mg Oral Daily   feeding supplement  237 mL Oral BID BM   magic mouthwash  5 mL Oral QID   nicotine  21 mg Transdermal Daily   valACYclovir  1,000 mg Oral BID    SUBJECTIVE:  Afebrile overnight with no acute events. Feeling better and now coughing up some sputum. No fevers/chills.  No Known Allergies   Review of Systems: Review of Systems  Constitutional:  Negative for chills, fever and weight loss.  Respiratory:  Negative for cough, shortness of breath and wheezing.    Cardiovascular:  Negative for chest pain and leg swelling.  Gastrointestinal:  Negative for abdominal pain, constipation, diarrhea, nausea and vomiting.  Skin:  Negative for rash.     OBJECTIVE: Vitals:   07/03/21 1644 07/03/21 2102 07/04/21 0532 07/04/21 0858  BP: 119/71 137/85 120/77 115/74  Pulse: 93 82 79 85  Resp: 16 18 17 14   Temp: 98.1 F (36.7 C) 98.4 F (36.9 C) 98.5 F (36.9 C) 98.2 F (36.8 C)  TempSrc: Oral Oral Oral Oral  SpO2: 100% 100% 99% 100%  Weight:      Height:       Body mass index is 23.78 kg/m.  Physical Exam Constitutional:      General: She is not in acute distress.    Appearance: She is well-developed.  Cardiovascular:     Rate and Rhythm: Normal rate and regular rhythm.     Heart sounds: Normal heart sounds.  Pulmonary:     Effort: Pulmonary effort is normal.     Breath sounds: Normal breath sounds.  Skin:    General: Skin is warm and dry.  Neurological:     Mental Status: She is alert and oriented to person, place, and time.  Psychiatric:        Behavior: Behavior normal.        Thought Content: Thought content normal.        Judgment: Judgment normal.    Lab Results Lab Results  Component Value Date   WBC 10.5 07/04/2021   HGB 7.8 (L) 07/04/2021  HCT 24.0 (L) 07/04/2021   MCV 87.6 07/04/2021   PLT 548 (H) 07/04/2021    Lab Results  Component Value Date   CREATININE 0.62 07/04/2021   BUN 13 07/04/2021   NA 134 (L) 07/04/2021   K 4.1 07/04/2021   CL 107 07/04/2021   CO2 21 (L) 07/04/2021    Lab Results  Component Value Date   ALT 50 (H) 06/29/2021   AST 40 06/29/2021   ALKPHOS 167 (H) 06/29/2021   BILITOT 1.2 06/29/2021     Microbiology: Recent Results (from the past 240 hour(s))  Blood culture (routine x 2)     Status: Abnormal   Collection Time: 06/29/21  9:54 AM   Specimen: BLOOD RIGHT HAND  Result Value Ref Range Status   Specimen Description BLOOD RIGHT HAND  Final   Special Requests   Final    BOTTLES  DRAWN AEROBIC ONLY Blood Culture results may not be optimal due to an inadequate volume of blood received in culture bottles   Culture  Setup Time   Final    GRAM POSITIVE COCCI AEROBIC BOTTLE ONLY CRITICAL RESULT CALLED TO, READ BACK BY AND VERIFIED WITH: G,BARR PHARMD @1338  06/30/21 EB Performed at [facility], [place] [address] [place], [place] [address]    Culture METHICILLIN RESISTANT STAPHYLOCOCCUS AUREUS (A)  Final   Report Status 07/02/2021 FINAL  Final   Organism ID, Bacteria METHICILLIN RESISTANT STAPHYLOCOCCUS AUREUS  Final      Susceptibility   Methicillin resistant staphylococcus aureus - MIC*    CIPROFLOXACIN <=0.5 SENSITIVE Sensitive     ERYTHROMYCIN >=8 RESISTANT Resistant     GENTAMICIN <=0.5 SENSITIVE Sensitive     OXACILLIN >=4 RESISTANT Resistant     TETRACYCLINE <=1 SENSITIVE Sensitive     VANCOMYCIN 1 SENSITIVE Sensitive     TRIMETH/SULFA <=10 SENSITIVE Sensitive     CLINDAMYCIN <=0.25 SENSITIVE Sensitive     RIFAMPIN <=0.5 SENSITIVE Sensitive     Inducible Clindamycin NEGATIVE Sensitive     * METHICILLIN RESISTANT STAPHYLOCOCCUS AUREUS  Blood Culture ID Panel (Reflexed)     Status: Abnormal   Collection Time: 06/29/21  9:54 AM  Result Value Ref Range Status   Enterococcus faecalis NOT DETECTED NOT DETECTED Final   Enterococcus Faecium NOT DETECTED NOT DETECTED Final   Listeria monocytogenes NOT DETECTED NOT DETECTED Final   Staphylococcus species DETECTED (A) NOT DETECTED Final    Comment: CRITICAL RESULT CALLED TO, READ BACK BY AND VERIFIED WITH: G,BARR PHARMD @1338  06/30/21 EB    Staphylococcus aureus (BCID) DETECTED (A) NOT DETECTED Final    Comment: Methicillin (oxacillin)-resistant Staphylococcus aureus (MRSA). MRSA is predictably resistant to beta-lactam antibiotics (except ceftaroline). Preferred therapy is vancomycin unless clinically contraindicated. Patient requires contact precautions if  hospitalized. CRITICAL RESULT CALLED TO, READ BACK BY  AND VERIFIED WITH: G,BARR PHARMD @1338  06/30/21 EB    Staphylococcus epidermidis NOT DETECTED NOT DETECTED Final   Staphylococcus lugdunensis NOT DETECTED NOT DETECTED Final   Streptococcus species NOT DETECTED NOT DETECTED Final   Streptococcus agalactiae NOT DETECTED NOT DETECTED Final   Streptococcus pneumoniae NOT DETECTED NOT DETECTED Final   Streptococcus pyogenes NOT DETECTED NOT DETECTED Final   A.calcoaceticus-baumannii NOT DETECTED NOT DETECTED Final   Bacteroides fragilis NOT DETECTED NOT DETECTED Final   Enterobacterales NOT DETECTED NOT DETECTED Final   Enterobacter cloacae complex NOT DETECTED NOT DETECTED Final   Escherichia coli NOT DETECTED NOT DETECTED Final   Klebsiella aerogenes NOT DETECTED NOT DETECTED Final  Klebsiella oxytoca NOT DETECTED NOT DETECTED Final   Klebsiella pneumoniae NOT DETECTED NOT DETECTED Final   Proteus species NOT DETECTED NOT DETECTED Final   Salmonella species NOT DETECTED NOT DETECTED Final   Serratia marcescens NOT DETECTED NOT DETECTED Final   Haemophilus influenzae NOT DETECTED NOT DETECTED Final   Neisseria meningitidis NOT DETECTED NOT DETECTED Final   Pseudomonas aeruginosa NOT DETECTED NOT DETECTED Final   Stenotrophomonas maltophilia NOT DETECTED NOT DETECTED Final   Candida albicans NOT DETECTED NOT DETECTED Final   Candida auris NOT DETECTED NOT DETECTED Final   Candida glabrata NOT DETECTED NOT DETECTED Final   Candida krusei NOT DETECTED NOT DETECTED Final   Candida parapsilosis NOT DETECTED NOT DETECTED Final   Candida tropicalis NOT DETECTED NOT DETECTED Final   Cryptococcus neoformans/gattii NOT DETECTED NOT DETECTED Final   Meth resistant mecA/C and MREJ DETECTED (A) NOT DETECTED Final    Comment: CRITICAL RESULT CALLED TO, READ BACK BY AND VERIFIED WITH: G,BARR PHARMD @1338  06/30/21 EB Performed at [facility], [address], [place], [place] [address]   Resp Panel by RT-PCR (Flu A&B, Covid) Nasopharyngeal  Swab     Status: None   Collection Time: 06/29/21  9:55 AM   Specimen: Nasopharyngeal Swab; Nasopharyngeal(NP) swabs in vial transport medium  Result Value Ref Range Status   SARS Coronavirus 2 by RT PCR NEGATIVE NEGATIVE Final    Comment: (NOTE) SARS-CoV-2 target nucleic acids are NOT DETECTED.  The SARS-CoV-2 RNA is generally detectable in upper respiratory specimens during the acute phase of infection. The lowest concentration of SARS-CoV-2 viral copies this assay can detect is 138 copies/mL. A negative result does not preclude SARS-Cov-2 infection and should not be used as the sole basis for treatment or other patient management decisions. A negative result may occur with  improper specimen collection/handling, submission of specimen other than nasopharyngeal swab, presence of viral mutation(s) within the areas targeted by this assay, and inadequate number of viral copies(<138 copies/mL). A negative result must be combined with clinical observations, patient history, and epidemiological information. The expected result is Negative.  Fact Sheet for Patients:  EntrepreneurPulse.com.au  Fact Sheet for Healthcare Providers:  IncredibleEmployment.be  This test is no t yet approved or cleared by the [place] FDA and  has been authorized for detection and/or diagnosis of SARS-CoV-2 by FDA under an Emergency Use Authorization (EUA). This EUA will remain  in effect (meaning this test can be used) for the duration of the COVID-19 declaration under Section 564(b)(1) of the Act, 21 U.S.C.section 360bbb-3(b)(1), unless the authorization is terminated  or revoked sooner.       Influenza A by PCR NEGATIVE NEGATIVE Final   Influenza B by PCR NEGATIVE NEGATIVE Final    Comment: (NOTE) The Xpert Xpress SARS-CoV-2/FLU/RSV plus assay is intended as an aid in the diagnosis of influenza from Nasopharyngeal swab specimens and should not be used as a sole  basis for treatment. Nasal washings and aspirates are unacceptable for Xpert Xpress SARS-CoV-2/FLU/RSV testing.  Fact Sheet for Patients: EntrepreneurPulse.com.au  Fact Sheet for Healthcare Providers: IncredibleEmployment.be  This test is not yet approved or cleared by the [place] FDA and has been authorized for detection and/or diagnosis of SARS-CoV-2 by FDA under an Emergency Use Authorization (EUA). This EUA will remain in effect (meaning this test can be used) for the duration of the COVID-19 declaration under Section 564(b)(1) of the Act, 21 U.S.C. section 360bbb-3(b)(1), unless the authorization is terminated or revoked.  Performed at [facility], [place] [address], [place], [place] [address]   Blood culture (routine x 2)     Status: Abnormal   Collection Time: 06/29/21  3:15 PM   Specimen: BLOOD  Result Value Ref Range Status   Specimen Description BLOOD BLOOD LEFT FOREARM  Final   Special Requests   Final    BOTTLES DRAWN AEROBIC AND ANAEROBIC Blood Culture adequate volume   Culture  Setup Time   Final    GRAM POSITIVE COCCI ANAEROBIC BOTTLE ONLY CRITICAL VALUE NOTED.  VALUE IS CONSISTENT WITH PREVIOUSLY REPORTED AND CALLED VALUE.    Culture (A)  Final    STAPHYLOCOCCUS AUREUS SUSCEPTIBILITIES PERFORMED ON PREVIOUS CULTURE WITHIN THE LAST 5 DAYS. Performed at [facility], [place] [address] [place], [place] [address]    Report Status 07/03/2021 FINAL  Final  Culture, blood (routine x 2)     Status: Abnormal   Collection Time: 06/30/21  6:50 PM   Specimen: BLOOD  Result Value Ref Range Status   Specimen Description BLOOD LEFT ANTECUBITAL  Final   Special Requests   Final    BOTTLES DRAWN AEROBIC ONLY Blood Culture results may not be optimal due to an inadequate volume of blood received in culture bottles   Culture  Setup Time   Final    GRAM POSITIVE COCCI IN CLUSTERS AEROBIC BOTTLE ONLY CRITICAL VALUE NOTED.   VALUE IS CONSISTENT WITH PREVIOUSLY REPORTED AND CALLED VALUE.    Culture (A)  Final    STAPHYLOCOCCUS AUREUS SUSCEPTIBILITIES PERFORMED ON PREVIOUS CULTURE WITHIN THE LAST 5 DAYS. Performed at [facility], [place] [address], [place], [place] [address]    Report Status 07/02/2021 FINAL  Final  Culture, blood (routine x 2)     Status: Abnormal   Collection Time: 06/30/21  6:55 PM   Specimen: BLOOD  Result Value Ref Range Status   Specimen Description BLOOD LEFT ANTECUBITAL  Final   Special Requests   Final    BOTTLES DRAWN AEROBIC ONLY Blood Culture results may not be optimal due to an inadequate volume of blood received in culture bottles   Culture  Setup Time   Final    GRAM POSITIVE COCCI IN CLUSTERS AEROBIC BOTTLE ONLY CRITICAL VALUE NOTED.  VALUE IS CONSISTENT WITH PREVIOUSLY REPORTED AND CALLED VALUE.    Culture (A)  Final    STAPHYLOCOCCUS AUREUS SUSCEPTIBILITIES PERFORMED ON PREVIOUS CULTURE WITHIN THE LAST 5 DAYS. Performed at [facility], [place] [address] [place], [place] [address]    Report Status 07/02/2021 FINAL  Final  Culture, blood (routine x 2)     Status: Abnormal (Preliminary result)   Collection Time: 07/02/21  8:50 AM   Specimen: BLOOD  Result Value Ref Range Status   Specimen Description BLOOD SITE NOT SPECIFIED  Final   Special Requests IN PEDIATRIC BOTTLE Blood Culture adequate volume  Final   Culture  Setup Time   Final    GRAM POSITIVE COCCI AEROBIC BOTTLE ONLY CRITICAL VALUE NOTED.  VALUE IS CONSISTENT WITH PREVIOUSLY REPORTED AND CALLED VALUE.    Culture (A)  Final    STAPHYLOCOCCUS AUREUS Sent to [facility] for further susceptibility testing. Performed at [facility], [place] [address], [place], [place] [address]    Report Status PENDING  Incomplete  Culture, blood (routine x 2)     Status: None (Preliminary result)   Collection Time: 07/02/21 11:50 AM   Specimen: BLOOD RIGHT FOREARM  Result  Value Ref Range Status   Specimen  Description BLOOD RIGHT FOREARM  Final   Special Requests   Final    BOTTLES DRAWN AEROBIC AND ANAEROBIC Blood Culture adequate volume   Culture   Final    NO GROWTH 2 DAYS Performed at [facility], [address], [place], [place] [address]    Report Status PENDING  Incomplete  Culture, blood (routine x 2)     Status: None (Preliminary result)   Collection Time: 07/03/21  8:06 PM   Specimen: BLOOD  Result Value Ref Range Status   Specimen Description BLOOD RIGHT ARM  Final   Special Requests   Final    BOTTLES DRAWN AEROBIC AND ANAEROBIC Blood Culture adequate volume   Culture   Final    NO GROWTH < 12 HOURS Performed at [facility], [place] [address] [place], [place] [address]    Report Status PENDING  Incomplete  Culture, blood (routine x 2)     Status: None (Preliminary result)   Collection Time: 07/03/21  8:06 PM   Specimen: BLOOD  Result Value Ref Range Status   Specimen Description BLOOD LEFT ARM  Final   Special Requests   Final    BOTTLES DRAWN AEROBIC AND ANAEROBIC Blood Culture adequate volume   Culture   Final    NO GROWTH < 12 HOURS Performed at [facility], [place] [address], [place], [place] [address]    Report Status PENDING  Incomplete     Terri Piedra, [facility]  07/04/2021  10:45 AM

## 2021-07-04 NOTE — Progress Notes (Signed)
Mobility Specialist Progress Note   07/04/21 1256  Mobility  Activity Ambulated in hall  Level of Assistance Independent  Assistive Device None  Distance Ambulated (ft) 550 ft  Mobility Ambulated independently in hallway  Mobility Response Tolerated well  Mobility performed by Mobility specialist  $Mobility charge 1 Mobility   Received pt coming out of BR, agreeable to mobility. Pt states to be in continuous 8/10 pain that they are now use to. Returned back to EOB w/ no complaints from ambulation and call bell by side.   Holland Falling Mobility Specialist Phone Number [phone]

## 2021-07-04 NOTE — Progress Notes (Signed)
Initial Nutrition Assessment  DOCUMENTATION CODES:  Severe malnutrition in context of chronic illness  INTERVENTION:  Advance diet as medically able and as tolerated.  Increase Ensure from BID to TID.  Add Magic cup TID with meals, each supplement provides 290 kcal and 9 grams of protein.  Add MVI with minerals daily.  NUTRITION DIAGNOSIS:  Severe Malnutrition related to chronic illness, cancer and cancer related treatments as evidenced by severe fat depletion, severe muscle depletion.  GOAL:  Patient will meet greater than or equal to 90% of their needs  MONITOR:  PO intake, Supplement acceptance, Diet advancement, Labs, Weight trends, Skin, I & O's  REASON FOR ASSESSMENT:  Malnutrition Screening Tool    ASSESSMENT:  50 yo female with a PMH of mild intermittent asthma, cancer, recently diagnosed MRSA on tricuspid valve with bilateral pulmonary emboli on 06/24/2021 at [facility], she left AMA 10/21, and came to [facility] seeking treatment.  Spoke with pt at bedside. Pt reports having an increased appetite after the last 2-3 weeks of almost no appetite and very little PO intake.  Per Epic, pt ate 0% of breakfast and lunch yesterday. Pt ate 75% of breakfast this morning.   Pt with no consistent weight history in Epic or Care Everywhere. Per Care Everywhere, pt weighed 20 lbs less than she did on admission just a week prior. Weight history then too far dated to track.  Recommend adding Ensure TID, Magic Cup TID, and MVI with minerals daily.  Supplements: Ensure BID  Medications: reviewed; Pepcid  Labs: reviewed; Na 134 (L), CBG 117 (H)  NUTRITION - FOCUSED PHYSICAL EXAM: Flowsheet Row Most Recent Value  Orbital Region Severe depletion  Upper Arm Region Severe depletion  Thoracic and Lumbar Region Severe depletion  Buccal Region Severe depletion  Temple Region Severe depletion  Clavicle Bone Region Severe depletion  Clavicle and Acromion Bone Region Severe  depletion  Scapular Bone Region Severe depletion  Dorsal Hand Moderate depletion  Patellar Region Moderate depletion  Anterior Thigh Region Mild depletion  Posterior Calf Region Mild depletion  Edema (RD Assessment) Mild  [RUE]  Hair Reviewed  Eyes Reviewed  Mouth Reviewed  Skin Reviewed  Nails Reviewed   Diet Order:   Diet Order             Diet regular Room service appropriate? Yes; Fluid consistency: Thin  Diet effective now                  EDUCATION NEEDS:  Education needs have been addressed  Skin:  Skin Assessment: Skin Integrity Issues: Skin Integrity Issues:: Other (Comment) Other: Skin tear - L leg  Last BM:  07/02/21  Height:  Ht Readings from Last 1 Encounters:  07/03/21 5\' 2"  (1.575 m)   Weight:  Wt Readings from Last 1 Encounters:  07/03/21 59 kg   BMI:  Body mass index is 23.78 kg/m.  Estimated Nutritional Needs:  Kcal:  8938-1017 Protein:  80-95 grams Fluid:  >1.75 L  Derrel Nip, RD, LDN (she/her/hers) Registered Dietitian I Pager #: [phone] After-Hours/Weekend Pager # in [place]

## 2021-07-05 DIAGNOSIS — L02419 Cutaneous abscess of limb, unspecified: Secondary | ICD-10-CM | POA: Diagnosis not present

## 2021-07-05 DIAGNOSIS — E43 Unspecified severe protein-calorie malnutrition: Secondary | ICD-10-CM | POA: Insufficient documentation

## 2021-07-05 DIAGNOSIS — I330 Acute and subacute infective endocarditis: Secondary | ICD-10-CM | POA: Diagnosis not present

## 2021-07-05 DIAGNOSIS — B182 Chronic viral hepatitis C: Secondary | ICD-10-CM | POA: Diagnosis not present

## 2021-07-05 DIAGNOSIS — R7881 Bacteremia: Secondary | ICD-10-CM | POA: Diagnosis not present

## 2021-07-05 DIAGNOSIS — B9562 Methicillin resistant Staphylococcus aureus infection as the cause of diseases classified elsewhere: Secondary | ICD-10-CM | POA: Diagnosis not present

## 2021-07-05 NOTE — Progress Notes (Signed)
Subjective:   No new complaints   Antibiotics:  Anti-infectives (From admission, onward)    Start     Dose/Rate Route Frequency Ordered Stop   07/01/21 2200  vancomycin (VANCOREADY) IVPB 750 mg/150 mL        750 mg 150 mL/hr over 60 Minutes Intravenous Every 12 hours 07/01/21 1314     07/01/21 1400  valACYclovir (VALTREX) tablet 1,000 mg        1,000 mg Oral 2 times daily 07/01/21 1306 07/08/21 0959   06/30/21 0200  vancomycin (VANCOREADY) IVPB 750 mg/150 mL  Status:  Discontinued        750 mg 150 mL/hr over 60 Minutes Intravenous Every 12 hours 06/29/21 1253 06/29/21 1254   06/29/21 1700  vancomycin (VANCOREADY) IVPB 750 mg/150 mL  Status:  Discontinued        750 mg 150 mL/hr over 60 Minutes Intravenous Every 12 hours 06/29/21 1308 07/01/21 1301   06/29/21 1615  rifampin (RIFADIN) capsule 300 mg  Status:  Discontinued        300 mg Oral Daily 06/29/21 1606 06/29/21 1607   06/29/21 1130  vancomycin (VANCOREADY) IVPB 1250 mg/250 mL  Status:  Discontinued        1,250 mg 166.7 mL/hr over 90 Minutes Intravenous  Once 06/29/21 1124 06/29/21 1254   06/29/21 1130  ceFEPIme (MAXIPIME) 2 g in sodium chloride 0.9 % 100 mL IVPB  Status:  Discontinued        2 g 200 mL/hr over 30 Minutes Intravenous  Once 06/29/21 1124 06/29/21 1251       Medications: Scheduled Meds:  buprenorphine-naloxone  1.5 tablet Sublingual Daily   enoxaparin (LOVENOX) injection  40 mg Subcutaneous Q24H   famotidine  20 mg Oral Daily   feeding supplement  237 mL Oral TID BM   magic mouthwash  5 mL Oral QID   multivitamin with minerals  1 tablet Oral Daily   nicotine  21 mg Transdermal Daily   valACYclovir  1,000 mg Oral BID   Continuous Infusions:  vancomycin 750 mg (07/05/21 0935)   PRN Meds:.acetaminophen **OR** acetaminophen, albuterol, ALPRAZolam, influenza vac split quadrivalent PF, ondansetron **OR** ondansetron (ZOFRAN) IV    Objective: Weight change:   Intake/Output Summary  (Last 24 hours) at 07/05/2021 1749 Last data filed at 07/04/2021 2129 Gross per 24 hour  Intake 320 ml  Output --  Net 320 ml    Blood pressure 125/77, pulse 87, temperature 99.5 F (37.5 C), temperature source Oral, resp. rate 18, height 5\' 2"  (1.575 m), weight 59 kg, SpO2 100 %. Temp:  [98.1 F (36.7 C)-99.5 F (37.5 C)] 99.5 F (37.5 C) (10/27 1500) Pulse Rate:  [74-87] 87 (10/27 1500) Resp:  [16-18] 18 (10/27 1500) BP: (112-128)/(71-88) 125/77 (10/27 1500) SpO2:  [100 %] 100 % (10/27 1500)  Physical Exam: Physical Exam Vitals reviewed.  Constitutional:      General: She is not in acute distress. HENT:     Head: Normocephalic and atraumatic.  Cardiovascular:     Rate and Rhythm: Tachycardia present.  Pulmonary:     Effort: No respiratory distress.     Breath sounds: No wheezing.  Abdominal:     General: There is no distension.  Neurological:     General: No focal deficit present.     Mental Status: She is alert and oriented to person, place, and time.  Psychiatric:        Mood and Affect: Mood  normal.        Behavior: Behavior normal.        Thought Content: Thought content normal.        Judgment: Judgment normal.     CBC:    BMET Recent Labs    07/04/21 0135  NA 134*  K 4.1  CL 107  CO2 21*  GLUCOSE 87  BUN 13  CREATININE 0.62  CALCIUM 7.7*      Liver Panel  No results for input(s): PROT, ALBUMIN, AST, ALT, ALKPHOS, BILITOT, BILIDIR, IBILI in the last 72 hours.     Sedimentation Rate No results for input(s): ESRSEDRATE in the last 72 hours.  C-Reactive Protein No results for input(s): CRP in the last 72 hours.  Micro Results: Recent Results (from the past 720 hour(s))  Blood culture (routine x 2)     Status: Abnormal   Collection Time: 06/29/21  9:54 AM   Specimen: BLOOD RIGHT HAND  Result Value Ref Range Status   Specimen Description BLOOD RIGHT HAND  Final   Special Requests   Final    BOTTLES DRAWN AEROBIC ONLY Blood Culture  results may not be optimal due to an inadequate volume of blood received in culture bottles   Culture  Setup Time   Final    GRAM POSITIVE COCCI AEROBIC BOTTLE ONLY CRITICAL RESULT CALLED TO, READ BACK BY AND VERIFIED WITH: G,BARR PHARMD @1338  06/30/21 EB Performed at [facility], [place] [address] [place], [place] [address]    Culture METHICILLIN RESISTANT STAPHYLOCOCCUS AUREUS (A)  Final   Report Status 07/02/2021 FINAL  Final   Organism ID, Bacteria METHICILLIN RESISTANT STAPHYLOCOCCUS AUREUS  Final      Susceptibility   Methicillin resistant staphylococcus aureus - MIC*    CIPROFLOXACIN <=0.5 SENSITIVE Sensitive     ERYTHROMYCIN >=8 RESISTANT Resistant     GENTAMICIN <=0.5 SENSITIVE Sensitive     OXACILLIN >=4 RESISTANT Resistant     TETRACYCLINE <=1 SENSITIVE Sensitive     VANCOMYCIN 1 SENSITIVE Sensitive     TRIMETH/SULFA <=10 SENSITIVE Sensitive     CLINDAMYCIN <=0.25 SENSITIVE Sensitive     RIFAMPIN <=0.5 SENSITIVE Sensitive     Inducible Clindamycin NEGATIVE Sensitive     * METHICILLIN RESISTANT STAPHYLOCOCCUS AUREUS  Blood Culture ID Panel (Reflexed)     Status: Abnormal   Collection Time: 06/29/21  9:54 AM  Result Value Ref Range Status   Enterococcus faecalis NOT DETECTED NOT DETECTED Final   Enterococcus Faecium NOT DETECTED NOT DETECTED Final   Listeria monocytogenes NOT DETECTED NOT DETECTED Final   Staphylococcus species DETECTED (A) NOT DETECTED Final    Comment: CRITICAL RESULT CALLED TO, READ BACK BY AND VERIFIED WITH: G,BARR PHARMD @1338  06/30/21 EB    Staphylococcus aureus (BCID) DETECTED (A) NOT DETECTED Final    Comment: Methicillin (oxacillin)-resistant Staphylococcus aureus (MRSA). MRSA is predictably resistant to beta-lactam antibiotics (except ceftaroline). Preferred therapy is vancomycin unless clinically contraindicated. Patient requires contact precautions if  hospitalized. CRITICAL RESULT CALLED TO, READ BACK BY AND VERIFIED WITH: G,BARR  PHARMD @1338  06/30/21 EB    Staphylococcus epidermidis NOT DETECTED NOT DETECTED Final   Staphylococcus lugdunensis NOT DETECTED NOT DETECTED Final   Streptococcus species NOT DETECTED NOT DETECTED Final   Streptococcus agalactiae NOT DETECTED NOT DETECTED Final   Streptococcus pneumoniae NOT DETECTED NOT DETECTED Final   Streptococcus pyogenes NOT DETECTED NOT DETECTED Final   A.calcoaceticus-baumannii NOT DETECTED NOT DETECTED Final   Bacteroides fragilis NOT DETECTED NOT DETECTED  Final   Enterobacterales NOT DETECTED NOT DETECTED Final   Enterobacter cloacae complex NOT DETECTED NOT DETECTED Final   Escherichia coli NOT DETECTED NOT DETECTED Final   Klebsiella aerogenes NOT DETECTED NOT DETECTED Final   Klebsiella oxytoca NOT DETECTED NOT DETECTED Final   Klebsiella pneumoniae NOT DETECTED NOT DETECTED Final   Proteus species NOT DETECTED NOT DETECTED Final   Salmonella species NOT DETECTED NOT DETECTED Final   Serratia marcescens NOT DETECTED NOT DETECTED Final   Haemophilus influenzae NOT DETECTED NOT DETECTED Final   Neisseria meningitidis NOT DETECTED NOT DETECTED Final   Pseudomonas aeruginosa NOT DETECTED NOT DETECTED Final   Stenotrophomonas maltophilia NOT DETECTED NOT DETECTED Final   Candida albicans NOT DETECTED NOT DETECTED Final   Candida auris NOT DETECTED NOT DETECTED Final   Candida glabrata NOT DETECTED NOT DETECTED Final   Candida krusei NOT DETECTED NOT DETECTED Final   Candida parapsilosis NOT DETECTED NOT DETECTED Final   Candida tropicalis NOT DETECTED NOT DETECTED Final   Cryptococcus neoformans/gattii NOT DETECTED NOT DETECTED Final   Meth resistant mecA/C and MREJ DETECTED (A) NOT DETECTED Final    Comment: CRITICAL RESULT CALLED TO, READ BACK BY AND VERIFIED WITH: G,BARR PHARMD @1338  06/30/21 EB Performed at [facility], [address], [place], [place] [address]   Resp Panel by RT-PCR (Flu A&B, Covid) Nasopharyngeal Swab     Status: None    Collection Time: 06/29/21  9:55 AM   Specimen: Nasopharyngeal Swab; Nasopharyngeal(NP) swabs in vial transport medium  Result Value Ref Range Status   SARS Coronavirus 2 by RT PCR NEGATIVE NEGATIVE Final    Comment: (NOTE) SARS-CoV-2 target nucleic acids are NOT DETECTED.  The SARS-CoV-2 RNA is generally detectable in upper respiratory specimens during the acute phase of infection. The lowest concentration of SARS-CoV-2 viral copies this assay can detect is 138 copies/mL. A negative result does not preclude SARS-Cov-2 infection and should not be used as the sole basis for treatment or other patient management decisions. A negative result may occur with  improper specimen collection/handling, submission of specimen other than nasopharyngeal swab, presence of viral mutation(s) within the areas targeted by this assay, and inadequate number of viral copies(<138 copies/mL). A negative result must be combined with clinical observations, patient history, and epidemiological information. The expected result is Negative.  Fact Sheet for Patients:  EntrepreneurPulse.com.au  Fact Sheet for Healthcare Providers:  IncredibleEmployment.be  This test is no t yet approved or cleared by the [place] FDA and  has been authorized for detection and/or diagnosis of SARS-CoV-2 by FDA under an Emergency Use Authorization (EUA). This EUA will remain  in effect (meaning this test can be used) for the duration of the COVID-19 declaration under Section 564(b)(1) of the Act, 21 U.S.C.section 360bbb-3(b)(1), unless the authorization is terminated  or revoked sooner.       Influenza A by PCR NEGATIVE NEGATIVE Final   Influenza B by PCR NEGATIVE NEGATIVE Final    Comment: (NOTE) The Xpert Xpress SARS-CoV-2/FLU/RSV plus assay is intended as an aid in the diagnosis of influenza from Nasopharyngeal swab specimens and should not be used as a sole basis for treatment.  Nasal washings and aspirates are unacceptable for Xpert Xpress SARS-CoV-2/FLU/RSV testing.  Fact Sheet for Patients: EntrepreneurPulse.com.au  Fact Sheet for Healthcare Providers: IncredibleEmployment.be  This test is not yet approved or cleared by the [place] FDA and has been authorized for detection and/or diagnosis of SARS-CoV-2 by FDA under an Emergency Use  Authorization (EUA). This EUA will remain in effect (meaning this test can be used) for the duration of the COVID-19 declaration under Section 564(b)(1) of the Act, 21 U.S.C. section 360bbb-3(b)(1), unless the authorization is terminated or revoked.  Performed at [facility], [place] [address], [place], [place] [address]   Blood culture (routine x 2)     Status: Abnormal   Collection Time: 06/29/21  3:15 PM   Specimen: BLOOD  Result Value Ref Range Status   Specimen Description BLOOD BLOOD LEFT FOREARM  Final   Special Requests   Final    BOTTLES DRAWN AEROBIC AND ANAEROBIC Blood Culture adequate volume   Culture  Setup Time   Final    GRAM POSITIVE COCCI ANAEROBIC BOTTLE ONLY CRITICAL VALUE NOTED.  VALUE IS CONSISTENT WITH PREVIOUSLY REPORTED AND CALLED VALUE.    Culture (A)  Final    STAPHYLOCOCCUS AUREUS SUSCEPTIBILITIES PERFORMED ON PREVIOUS CULTURE WITHIN THE LAST 5 DAYS. Performed at [facility], [place] [address] [place], [place] [address]    Report Status 07/03/2021 FINAL  Final  Culture, blood (routine x 2)     Status: Abnormal   Collection Time: 06/30/21  6:50 PM   Specimen: BLOOD  Result Value Ref Range Status   Specimen Description BLOOD LEFT ANTECUBITAL  Final   Special Requests   Final    BOTTLES DRAWN AEROBIC ONLY Blood Culture results may not be optimal due to an inadequate volume of blood received in culture bottles   Culture  Setup Time   Final    GRAM POSITIVE COCCI IN CLUSTERS AEROBIC BOTTLE ONLY CRITICAL VALUE NOTED.  VALUE IS CONSISTENT  WITH PREVIOUSLY REPORTED AND CALLED VALUE.    Culture (A)  Final    STAPHYLOCOCCUS AUREUS SUSCEPTIBILITIES PERFORMED ON PREVIOUS CULTURE WITHIN THE LAST 5 DAYS. Performed at [facility], [place] [address] [place], [place] [address]    Report Status 07/02/2021 FINAL  Final  Culture, blood (routine x 2)     Status: Abnormal   Collection Time: 06/30/21  6:55 PM   Specimen: BLOOD  Result Value Ref Range Status   Specimen Description BLOOD LEFT ANTECUBITAL  Final   Special Requests   Final    BOTTLES DRAWN AEROBIC ONLY Blood Culture results may not be optimal due to an inadequate volume of blood received in culture bottles   Culture  Setup Time   Final    GRAM POSITIVE COCCI IN CLUSTERS AEROBIC BOTTLE ONLY CRITICAL VALUE NOTED.  VALUE IS CONSISTENT WITH PREVIOUSLY REPORTED AND CALLED VALUE.    Culture (A)  Final    STAPHYLOCOCCUS AUREUS SUSCEPTIBILITIES PERFORMED ON PREVIOUS CULTURE WITHIN THE LAST 5 DAYS. Performed at [facility], [place] [address] [place], [place] [address]    Report Status 07/02/2021 FINAL  Final  Culture, blood (routine x 2)     Status: Abnormal (Preliminary result)   Collection Time: 07/02/21  8:50 AM   Specimen: BLOOD  Result Value Ref Range Status   Specimen Description BLOOD SITE NOT SPECIFIED  Final   Special Requests IN PEDIATRIC BOTTLE Blood Culture adequate volume  Final   Culture  Setup Time   Final    GRAM POSITIVE COCCI AEROBIC BOTTLE ONLY CRITICAL VALUE NOTED.  VALUE IS CONSISTENT WITH PREVIOUSLY REPORTED AND CALLED VALUE.    Culture (A)  Final    STAPHYLOCOCCUS AUREUS Sent to [facility] for further susceptibility testing. Performed at [facility], [place],  [place] [address]    Report Status PENDING  Incomplete  Culture, blood (routine x 2)     Status: None (Preliminary result)   Collection Time: 07/02/21 11:50 AM   Specimen: BLOOD RIGHT FOREARM  Result Value Ref Range Status   Specimen Description BLOOD RIGHT  FOREARM  Final   Special Requests   Final    BOTTLES DRAWN AEROBIC AND ANAEROBIC Blood Culture adequate volume   Culture   Final    NO GROWTH 3 DAYS Performed at [facility], [place] [address], [place], [place] [address]    Report Status PENDING  Incomplete  Culture, blood (routine x 2)     Status: None (Preliminary result)   Collection Time: 07/03/21  8:06 PM   Specimen: BLOOD  Result Value Ref Range Status   Specimen Description BLOOD RIGHT ARM  Final   Special Requests   Final    BOTTLES DRAWN AEROBIC AND ANAEROBIC Blood Culture adequate volume   Culture  Setup Time   Final    GRAM POSITIVE COCCI IN CLUSTERS ANAEROBIC BOTTLE ONLY CRITICAL VALUE NOTED.  VALUE IS CONSISTENT WITH PREVIOUSLY REPORTED AND CALLED VALUE. Performed at [facility], [place] [address], [place], [place] [address]    Culture GRAM POSITIVE COCCI  Final   Report Status PENDING  Incomplete  Culture, blood (routine x 2)     Status: None (Preliminary result)   Collection Time: 07/03/21  8:06 PM   Specimen: BLOOD  Result Value Ref Range Status   Specimen Description BLOOD LEFT ARM  Final   Special Requests   Final    BOTTLES DRAWN AEROBIC AND ANAEROBIC Blood Culture adequate volume   Culture   Final    NO GROWTH 2 DAYS Performed at [facility], [place] [address] [place], [place] [address]    Report Status PENDING  Incomplete    Studies/Results: No results found.    Assessment/Plan:  INTERVAL HISTORY:   Blood cultures now finally not growing MRSA   Principal Problem:   MRSA bacteremia Active Problems:   Severe opioid use disorder ([place])   Endocarditis   Septic pulmonary embolism ([facility])   Chronic viral hepatitis C ([place])   Shoulder abscess   Calf abscess   Odynophagia   Protein-calorie malnutrition, severe    Yvette Gaines is a 50 y.o. 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, recent left shoulder and left Abscesses status  post I&D on June 14, 2021 at [facility] vasculitic rash and HCV+  #1 MRSA bacteremia with TV endocarditis and septic embolism    Greatly appreciate Cardiology and CT surgery  She is again with [facility] in 1/2 cultures from the 25th which I have reviewed  (I am hopoing this is a contaminant)  May need to consider dual therapy and also angiovac  #2 Shoulder and calf abscesses resolved  #3 HCV: I resent HCV RNA, she is Hep A immune and HBV immune  She may benefit from PrEP vs HIV     LOS: 6 days   Alcide Evener 07/05/2021, 5:49 PM

## 2021-07-05 NOTE — Progress Notes (Signed)
Mobility Specialist Progress Note   07/05/21 1638  Mobility  Activity Refused mobility   Pt states chest is still hurting and would like to hold off until tomorrow.  Holland Falling Mobility Specialist Phone Number [phone]

## 2021-07-05 NOTE — Progress Notes (Signed)
Mobility Specialist Progress Note   07/05/21 1544  Mobility  Activity Refused mobility   Pt states that chest is hurting real bad today and asked for me to come back in an hour.   Holland Falling Mobility Specialist Phone Number [phone]

## 2021-07-05 NOTE — Progress Notes (Signed)
PROGRESS NOTE    Hindy Perrault  OHY:[id number] DOB: 12-27-1970 DOA: 06/29/2021 PCP: [name], DO    Chief Complaint  Patient presents with   Chest Pain   Weakness   Shortness of Breath   Drug Problem    Brief Narrative:  Kyira Volkert is a 50 y.o. 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   Patient presented to [facility] on 10/05 for worsening of left shoulder swelling and pain.  Was found to have abscess of left shoulder and left calf,  I&D was done on 10/06 and culture showed MRSA.[name]  Patient however signed out [place] after the surgery. On 10/16, patient came back to [facility] complaining about new onset of chest pains and shortness of breath, CT chest showed multiple bilateral septic emboli, echocardiogram showed new onset of tricuspid vegetation compatible with endocarditis.  Blood culture showed again MRSA.  Patient was started on vancomycin and cefepime since. During hospital stay, patient was also found to develop acute thrombocytopenia, DIC was ruled out.  Patient was admitted to [facility], and started on IV vancomycin, TEE with tricuspid leaflet vegetation.     Assessment & Plan:   Principal Problem:   MRSA bacteremia Active Problems:   Severe opioid use disorder ([place])   Endocarditis   Septic pulmonary embolism (HCC)   Chronic viral hepatitis C (HCC)   Shoulder abscess   Calf abscess   Odynophagia   Protein-calorie malnutrition, severe   MRSA bacteremia/MRSA TV endocarditis/septic emboli/recent right shoulder and right calf abscess status post I&D 10/6 at [facility] in a patient with IV drug use. -Blood cultures positive for MRSA on 10/21, 10/22.   -Blood cultures 1/2 positive from 10/24. -Blood cultures negative so far from 10/25. -TEE  consistent with tricuspid valve endocarditis.  Seen by CT surgery.  Decided against any need for surgery. -Not a candidate to go home with PICC line, last drug use a week ago.   May stay in the hospital to complete antibiotic therapy.   Thrombocytopenia: Resolved.   Recent right shoulder abscess -Status post I&D on 10/06 at [facility], according to records and imaging study and orthopedic note, there was no infection on [facility] joint itself, and patient recovered well. No acute issue now.   Severe protein calorie malnutrition Nutrition Status: Nutrition Problem: Severe Malnutrition Etiology: chronic illness, cancer and cancer related treatments Signs/Symptoms: severe fat depletion, severe muscle depletion Interventions: Ensure Enlive (each supplement provides 350kcal and 20 grams of protein), Magic cup, MVI    Mild intermittent asthma -At baseline.  As needed bronchodilator.   Cigarette smoking -Nicotine patch.  Hypokalemia, resolved.  IV drug abuse -She wants to quit.  Started on Suboxone 1.5 mg daily.  Currently no evidence of withdrawals.  Normocytic anemia -This is most likely anemia of acute illness/anemia of chronic disease, anemia panel with normal R48, folic acid, and ferritin level, iron level is low. -Transfuse for hemoglobin less than 7.     +HCV AB -Quantitative RNA pending.    DVT prophylaxis: Heparin Code Status: Full Family Communication: None at bedside Disposition:   Status is: Inpatient  Remains inpatient appropriate because: Bacteremia with need for IV antibiotics       Consultants:  ID CT surgery   Subjective:  Patient seen and examined.  Denies any complaints.  Afebrile.  Objective:  Vitals:   07/04/21 1956 07/05/21 0441 07/05/21 0802 07/05/21 0818  BP: 112/71 126/82 128/88 119/86  Pulse: 87 83 75 74  Resp: 18 18 16 16   Temp: 98.8 F (37.1 C) 98.5 F (36.9 C) 98.6 F (37 C) 98.1 F (36.7 C)  TempSrc: Oral Oral Oral Oral  SpO2: 100%  100% 100% 100%  Weight:      Height:        Intake/Output Summary (Last 24 hours) at 07/05/2021 1257 Last data filed at 07/04/2021 2129 [place] per 24 hour  Intake 320 ml  Output --  Net 320 ml    Filed Weights   06/29/21 1100 07/03/21 1237  Weight: 60.3 kg 59 kg    Examination:  General: Looks comfortable.  Walking around in the room. Cardiovascular: S1-S2 normal.  Regular rate rhythm. Respiratory: Bilateral clear.  No added sounds. Gastrointestinal: Soft and nontender.  Bowel sound present. Ext: No swelling or edema.  No cyanosis. Neuro: Alert oriented x4.  No focal deficits.    Data Reviewed: I have personally reviewed following labs and imaging studies  CBC: Recent Labs  Lab 06/29/21 0924 06/30/21 0127 07/01/21 0155 07/02/21 0101 07/04/21 0135  WBC 16.9* 16.3* 12.0* 9.2 10.5  HGB 8.5* 7.6* 7.1* 8.1* 7.8*  HCT 27.4* 23.2* 21.8* 25.3* 24.0*  MCV 90.1 84.4 84.8 87.2 87.6  PLT 336 404* 402* 502* 548*    Basic Metabolic Panel: Recent Labs  Lab 06/29/21 0924 06/30/21 0127 07/01/21 0155 07/02/21 0101 07/04/21 0135  NA 131* 130* 129* 132* 134*  K 3.7 3.2* 3.7 4.0 4.1  CL 103 103 102 105 107  CO2 17* 19* 19* 19* 21*  GLUCOSE 98 99 132* 98 87  BUN 13 11 13 14 13   CREATININE 0.61 0.65 0.66 0.59 0.62  CALCIUM 7.7* 7.3* 7.4* 7.4* 7.7*  MG  --   --  1.9  --   --   PHOS  --   --  2.6 4.0  --     GFR: Estimated Creatinine Clearance: 66.5 mL/min (by C-G formula based on SCr of 0.62 mg/dL).  Liver Function Tests: Recent Labs  Lab 06/29/21 0924  AST 40  ALT 50*  ALKPHOS 167*  BILITOT 1.2  PROT 6.0*  ALBUMIN 1.8*    CBG: Recent Labs  Lab 07/03/21 2059  GLUCAP 117*     Recent Results (from the past 240 hour(s))  Blood culture (routine x 2)     Status: Abnormal   Collection Time: 06/29/21  9:54 AM   Specimen: BLOOD RIGHT HAND  Result Value Ref Range Status   Specimen Description BLOOD RIGHT HAND  Final   Special Requests   Final    BOTTLES  DRAWN AEROBIC ONLY Blood Culture results may not be optimal due to an inadequate volume of blood received in culture bottles   Culture  Setup Time   Final    GRAM POSITIVE COCCI AEROBIC BOTTLE ONLY CRITICAL RESULT CALLED TO, READ BACK BY AND VERIFIED WITH: G,BARR PHARMD @1338  06/30/21 EB Performed at [facility], [place] [address] [place], [place] [address]    Culture METHICILLIN RESISTANT STAPHYLOCOCCUS AUREUS (A)  Final   Report Status 07/02/2021 FINAL  Final   Organism ID, Bacteria METHICILLIN RESISTANT STAPHYLOCOCCUS AUREUS  Final      Susceptibility   Methicillin resistant staphylococcus aureus - MIC*    CIPROFLOXACIN <=0.5 SENSITIVE Sensitive     ERYTHROMYCIN >=8 RESISTANT Resistant     GENTAMICIN <=0.5 SENSITIVE Sensitive  OXACILLIN >=4 RESISTANT Resistant     TETRACYCLINE <=1 SENSITIVE Sensitive     VANCOMYCIN 1 SENSITIVE Sensitive     TRIMETH/SULFA <=10 SENSITIVE Sensitive     CLINDAMYCIN <=0.25 SENSITIVE Sensitive     RIFAMPIN <=0.5 SENSITIVE Sensitive     Inducible Clindamycin NEGATIVE Sensitive     * METHICILLIN RESISTANT STAPHYLOCOCCUS AUREUS  Blood Culture ID Panel (Reflexed)     Status: Abnormal   Collection Time: 06/29/21  9:54 AM  Result Value Ref Range Status   Enterococcus faecalis NOT DETECTED NOT DETECTED Final   Enterococcus Faecium NOT DETECTED NOT DETECTED Final   Listeria monocytogenes NOT DETECTED NOT DETECTED Final   Staphylococcus species DETECTED (A) NOT DETECTED Final    Comment: CRITICAL RESULT CALLED TO, READ BACK BY AND VERIFIED WITH: G,BARR PHARMD @1338  06/30/21 EB    Staphylococcus aureus (BCID) DETECTED (A) NOT DETECTED Final    Comment: Methicillin (oxacillin)-resistant Staphylococcus aureus (MRSA). MRSA is predictably resistant to beta-lactam antibiotics (except ceftaroline). Preferred therapy is vancomycin unless clinically contraindicated. Patient requires contact precautions if  hospitalized. CRITICAL RESULT CALLED TO, READ BACK BY  AND VERIFIED WITH: G,BARR PHARMD @1338  06/30/21 EB    Staphylococcus epidermidis NOT DETECTED NOT DETECTED Final   Staphylococcus lugdunensis NOT DETECTED NOT DETECTED Final   Streptococcus species NOT DETECTED NOT DETECTED Final   Streptococcus agalactiae NOT DETECTED NOT DETECTED Final   Streptococcus pneumoniae NOT DETECTED NOT DETECTED Final   Streptococcus pyogenes NOT DETECTED NOT DETECTED Final   A.calcoaceticus-baumannii NOT DETECTED NOT DETECTED Final   Bacteroides fragilis NOT DETECTED NOT DETECTED Final   Enterobacterales NOT DETECTED NOT DETECTED Final   Enterobacter cloacae complex NOT DETECTED NOT DETECTED Final   Escherichia coli NOT DETECTED NOT DETECTED Final   Klebsiella aerogenes NOT DETECTED NOT DETECTED Final   Klebsiella oxytoca NOT DETECTED NOT DETECTED Final   Klebsiella pneumoniae NOT DETECTED NOT DETECTED Final   Proteus species NOT DETECTED NOT DETECTED Final   Salmonella species NOT DETECTED NOT DETECTED Final   Serratia marcescens NOT DETECTED NOT DETECTED Final   Haemophilus influenzae NOT DETECTED NOT DETECTED Final   Neisseria meningitidis NOT DETECTED NOT DETECTED Final   Pseudomonas aeruginosa NOT DETECTED NOT DETECTED Final   Stenotrophomonas maltophilia NOT DETECTED NOT DETECTED Final   Candida albicans NOT DETECTED NOT DETECTED Final   Candida auris NOT DETECTED NOT DETECTED Final   Candida glabrata NOT DETECTED NOT DETECTED Final   Candida krusei NOT DETECTED NOT DETECTED Final   Candida parapsilosis NOT DETECTED NOT DETECTED Final   Candida tropicalis NOT DETECTED NOT DETECTED Final   Cryptococcus neoformans/gattii NOT DETECTED NOT DETECTED Final   Meth resistant mecA/C and MREJ DETECTED (A) NOT DETECTED Final    Comment: CRITICAL RESULT CALLED TO, READ BACK BY AND VERIFIED WITH: G,BARR PHARMD @1338  06/30/21 EB Performed at [facility], [address], [place], [place] [address]   Resp Panel by RT-PCR (Flu A&B, Covid) Nasopharyngeal  Swab     Status: None   Collection Time: 06/29/21  9:55 AM   Specimen: Nasopharyngeal Swab; Nasopharyngeal(NP) swabs in vial transport medium  Result Value Ref Range Status   SARS Coronavirus 2 by RT PCR NEGATIVE NEGATIVE Final    Comment: (NOTE) SARS-CoV-2 target nucleic acids are NOT DETECTED.  The SARS-CoV-2 RNA is generally detectable in upper respiratory specimens during the acute phase of infection. The lowest concentration of SARS-CoV-2 viral copies this assay can detect is 138 copies/mL. A negative result does not preclude  SARS-Cov-2 infection and should not be used as the sole basis for treatment or other patient management decisions. A negative result may occur with  improper specimen collection/handling, submission of specimen other than nasopharyngeal swab, presence of viral mutation(s) within the areas targeted by this assay, and inadequate number of viral copies(<138 copies/mL). A negative result must be combined with clinical observations, patient history, and epidemiological information. The expected result is Negative.  Fact Sheet for Patients:  EntrepreneurPulse.com.au  Fact Sheet for Healthcare Providers:  IncredibleEmployment.be  This test is no t yet approved or cleared by the [place] FDA and  has been authorized for detection and/or diagnosis of SARS-CoV-2 by FDA under an Emergency Use Authorization (EUA). This EUA will remain  in effect (meaning this test can be used) for the duration of the COVID-19 declaration under Section 564(b)(1) of the Act, 21 U.S.C.section 360bbb-3(b)(1), unless the authorization is terminated  or revoked sooner.       Influenza A by PCR NEGATIVE NEGATIVE Final   Influenza B by PCR NEGATIVE NEGATIVE Final    Comment: (NOTE) The Xpert Xpress SARS-CoV-2/FLU/RSV plus assay is intended as an aid in the diagnosis of influenza from Nasopharyngeal swab specimens and should not be used as a sole  basis for treatment. Nasal washings and aspirates are unacceptable for Xpert Xpress SARS-CoV-2/FLU/RSV testing.  Fact Sheet for Patients: EntrepreneurPulse.com.au  Fact Sheet for Healthcare Providers: IncredibleEmployment.be  This test is not yet approved or cleared by the [place] FDA and has been authorized for detection and/or diagnosis of SARS-CoV-2 by FDA under an Emergency Use Authorization (EUA). This EUA will remain in effect (meaning this test can be used) for the duration of the COVID-19 declaration under Section 564(b)(1) of the Act, 21 U.S.C. section 360bbb-3(b)(1), unless the authorization is terminated or revoked.  Performed at [facility], [place] [address], [place], [place] [address]   Blood culture (routine x 2)     Status: Abnormal   Collection Time: 06/29/21  3:15 PM   Specimen: BLOOD  Result Value Ref Range Status   Specimen Description BLOOD BLOOD LEFT FOREARM  Final   Special Requests   Final    BOTTLES DRAWN AEROBIC AND ANAEROBIC Blood Culture adequate volume   Culture  Setup Time   Final    GRAM POSITIVE COCCI ANAEROBIC BOTTLE ONLY CRITICAL VALUE NOTED.  VALUE IS CONSISTENT WITH PREVIOUSLY REPORTED AND CALLED VALUE.    Culture (A)  Final    STAPHYLOCOCCUS AUREUS SUSCEPTIBILITIES PERFORMED ON PREVIOUS CULTURE WITHIN THE LAST 5 DAYS. Performed at [facility], [place] [address] [place], [place] [address]    Report Status 07/03/2021 FINAL  Final  Culture, blood (routine x 2)     Status: Abnormal   Collection Time: 06/30/21  6:50 PM   Specimen: BLOOD  Result Value Ref Range Status   Specimen Description BLOOD LEFT ANTECUBITAL  Final   Special Requests   Final    BOTTLES DRAWN AEROBIC ONLY Blood Culture results may not be optimal due to an inadequate volume of blood received in culture bottles   Culture  Setup Time   Final    GRAM POSITIVE COCCI IN CLUSTERS AEROBIC BOTTLE ONLY CRITICAL VALUE NOTED.   VALUE IS CONSISTENT WITH PREVIOUSLY REPORTED AND CALLED VALUE.    Culture (A)  Final    STAPHYLOCOCCUS AUREUS SUSCEPTIBILITIES PERFORMED ON PREVIOUS CULTURE WITHIN THE LAST 5 DAYS. Performed at [facility], [place] [address] [place], [place] [address]  Report Status 07/02/2021 FINAL  Final  Culture, blood (routine x 2)     Status: Abnormal   Collection Time: 06/30/21  6:55 PM   Specimen: BLOOD  Result Value Ref Range Status   Specimen Description BLOOD LEFT ANTECUBITAL  Final   Special Requests   Final    BOTTLES DRAWN AEROBIC ONLY Blood Culture results may not be optimal due to an inadequate volume of blood received in culture bottles   Culture  Setup Time   Final    GRAM POSITIVE COCCI IN CLUSTERS AEROBIC BOTTLE ONLY CRITICAL VALUE NOTED.  VALUE IS CONSISTENT WITH PREVIOUSLY REPORTED AND CALLED VALUE.    Culture (A)  Final    STAPHYLOCOCCUS AUREUS SUSCEPTIBILITIES PERFORMED ON PREVIOUS CULTURE WITHIN THE LAST 5 DAYS. Performed at [facility], [place] [address], [place], [place] [address]    Report Status 07/02/2021 FINAL  Final  Culture, blood (routine x 2)     Status: Abnormal (Preliminary result)   Collection Time: 07/02/21  8:50 AM   Specimen: BLOOD  Result Value Ref Range Status   Specimen Description BLOOD SITE NOT SPECIFIED  Final   Special Requests IN PEDIATRIC BOTTLE Blood Culture adequate volume  Final   Culture  Setup Time   Final    GRAM POSITIVE COCCI AEROBIC BOTTLE ONLY CRITICAL VALUE NOTED.  VALUE IS CONSISTENT WITH PREVIOUSLY REPORTED AND CALLED VALUE.    Culture (A)  Final    STAPHYLOCOCCUS AUREUS Sent to [place] for further susceptibility testing. Performed at [facility], [place] [address] [place], [place] [address]    Report Status PENDING  Incomplete  Culture, blood (routine x 2)     Status: None (Preliminary result)   Collection Time: 07/02/21 11:50 AM   Specimen: BLOOD RIGHT FOREARM  Result Value Ref Range Status   Specimen  Description BLOOD RIGHT FOREARM  Final   Special Requests   Final    BOTTLES DRAWN AEROBIC AND ANAEROBIC Blood Culture adequate volume   Culture   Final    NO GROWTH 3 DAYS Performed at [facility], [place] [address] [place], [place] [address]    Report Status PENDING  Incomplete  Culture, blood (routine x 2)     Status: None (Preliminary result)   Collection Time: 07/03/21  8:06 PM   Specimen: BLOOD  Result Value Ref Range Status   Specimen Description BLOOD RIGHT ARM  Final   Special Requests   Final    BOTTLES DRAWN AEROBIC AND ANAEROBIC Blood Culture adequate volume   Culture  Setup Time   Final    GRAM POSITIVE COCCI IN CLUSTERS ANAEROBIC BOTTLE ONLY CRITICAL VALUE NOTED.  VALUE IS CONSISTENT WITH PREVIOUSLY REPORTED AND CALLED VALUE. Performed at [facility], [place] [address], [place], [place] [address]    Culture GRAM POSITIVE COCCI  Final   Report Status PENDING  Incomplete  Culture, blood (routine x 2)     Status: None (Preliminary result)   Collection Time: 07/03/21  8:06 PM   Specimen: BLOOD  Result Value Ref Range Status   Specimen Description BLOOD LEFT ARM  Final   Special Requests   Final    BOTTLES DRAWN AEROBIC AND ANAEROBIC Blood Culture adequate volume   Culture   Final    NO GROWTH 2 DAYS Performed at [facility], [place] [address], [place], [place] [address]    Report Status PENDING  Incomplete  Radiology Studies: ECHO TEE  Result Date: 07/03/2021    TRANSESOPHOGEAL ECHO REPORT   Patient Name:   STEPHENIE NAVEJAS Date of Exam: 07/03/2021 Medical Rec #:  [id number]    Height:       62.0 in Accession #:    [id number]   Weight:       130.0 lb Date of Birth:  July 13, 1971    BSA:          1.592 m Patient Age:    10 years     BP:           118/78 mmHg Patient Gender: F            HR:           61 bpm. Exam Location:  Inpatient Procedure: 2D Echo, Color Doppler, Cardiac Doppler and 3D Echo Indications:    Bacteremia  History:        Patient has  no prior history of Echocardiogram examinations.                 IVDU.  Sonographer:    Merrie Roof RDCS Referring Phys: [id number] HAO MENG Diagnosing      Kardie Tobb DO Phys: PROCEDURE: The transesophogeal probe was passed without difficulty through the esophogus of the patient. Local oropharyngeal anesthetic was provided with Cetacaine. Sedation performed by different physician. The patient was monitored while under deep sedation. The patient's vital signs; including heart rate, blood pressure, and oxygen saturation; remained stable throughout the procedure. The patient developed no complications during the procedure. IMPRESSIONS  1. Left ventricular ejection fraction, by estimation, is 55 to 60%. The left ventricle has normal function.  2. Right ventricular systolic function is low normal. The right ventricular size is normal.  3. No left atrial/left atrial appendage thrombus was detected.  4. The mitral valve is normal in structure. Trivial mitral valve regurgitation.  5. There is a highly mobile echodense mass on the anterior leaflet of the tricuspid valve measuring 1.35 cm x 0.582 cm. The valve leaftets appears to be intact with no appreciation of a perforation. Tricuspid valve regurgitation is moderate.  6. The aortic valve is tricuspid. Aortic valve regurgitation is not visualized. FINDINGS  Left Ventricle: Left ventricular ejection fraction, by estimation, is 55 to 60%. The left ventricle has normal function. The left ventricular internal cavity size was normal in size. Right Ventricle: The right ventricular size is normal. No increase in right ventricular wall thickness. Right ventricular systolic function is low normal. Left Atrium: Left atrial size was normal in size. No left atrial/left atrial appendage thrombus was detected. Right Atrium: Right atrial size was normal in size. Pericardium: There is no evidence of pericardial effusion. Mitral Valve: The mitral valve is normal in structure. Trivial mitral  valve regurgitation. Tricuspid Valve: There is a highly mobile echodense mass on the anterior leaflet of the tricuspid valve measuring 1.35 cm x 0.582 cm. The valve leaftets appears to be intact with no appreciation of a perforation or tear. The tricuspid valve is abnormal. Tricuspid valve regurgitation is moderate . No evidence of tricuspid stenosis. Aortic Valve: The aortic valve is tricuspid. Aortic valve regurgitation is not visualized. Pulmonic Valve: The pulmonic valve was not well visualized. Pulmonic valve regurgitation is trivial. Aorta: Aortic root could not be assessed. There is minimal (Grade I) layered plaque involving the descending aorta. Venous: The left upper pulmonary vein and left lower pulmonary vein are normal. IAS/Shunts: No atrial level shunt detected by color flow Doppler.  TRICUSPID VALVE TR Peak grad:   25.4 mmHg TR Vmax:        252.00 cm/s Kardie Tobb DO Electronically signed by Berniece Salines DO Signature Date/Time: 07/03/2021/3:48:29 PM    Final         Scheduled Meds:  buprenorphine-naloxone  1.5 tablet Sublingual Daily   enoxaparin (LOVENOX) injection  40 mg Subcutaneous Q24H   famotidine  20 mg Oral Daily   feeding supplement  237 mL Oral TID BM   magic mouthwash  5 mL Oral QID   multivitamin with minerals  1 tablet Oral Daily   nicotine  21 mg Transdermal Daily   valACYclovir  1,000 mg Oral BID   Continuous Infusions:  vancomycin 750 mg (07/05/21 0935)     LOS: 6 days   Total time spent: 30 minutes    Barb Merino, MD [facility]  07/05/2021, 12:57 PM

## 2021-07-06 DIAGNOSIS — I2690 Septic pulmonary embolism without acute cor pulmonale: Secondary | ICD-10-CM | POA: Diagnosis not present

## 2021-07-06 DIAGNOSIS — F1120 Opioid dependence, uncomplicated: Secondary | ICD-10-CM | POA: Diagnosis not present

## 2021-07-06 DIAGNOSIS — B9562 Methicillin resistant Staphylococcus aureus infection as the cause of diseases classified elsewhere: Secondary | ICD-10-CM | POA: Diagnosis not present

## 2021-07-06 DIAGNOSIS — I330 Acute and subacute infective endocarditis: Secondary | ICD-10-CM | POA: Diagnosis not present

## 2021-07-06 DIAGNOSIS — R7881 Bacteremia: Secondary | ICD-10-CM | POA: Diagnosis not present

## 2021-07-06 LAB — HCV RNA QUANT: HCV Quantitative: NOT DETECTED IU/mL (ref >50)

## 2021-07-06 LAB — CK: Total CK: 16 U/L — ABNORMAL LOW (ref 38–234)

## 2021-07-06 MED ORDER — DAPTOMYCIN 500 MG IV SOLR
500.0000 mg | Freq: Every day | INTRAVENOUS | Status: AC
Start: 2021-07-06 — End: 2021-07-12
  Administered 2021-07-06 – 2021-07-12 (×7): 500 mg via INTRAVENOUS
  Filled 2021-07-06 (×8): qty 10

## 2021-07-06 MED ORDER — SODIUM CHLORIDE 0.9 % IV SOLN
600.0000 mg | Freq: Three times a day (TID) | INTRAVENOUS | Status: AC
  Administered 2021-07-06 – 2021-07-12 (×20): 600 mg via INTRAVENOUS
  Filled 2021-07-06 (×20): qty 20

## 2021-07-06 NOTE — Progress Notes (Signed)
Mobility Specialist Progress Note   07/06/21 1110  Mobility  Activity Ambulated in hall  Level of Assistance Modified independent, requires aide device or extra time  Assistive Device  (IV Pole)  Distance Ambulated (ft) 1120 ft  Mobility Ambulated independently in hallway  Mobility Response Tolerated well  Mobility performed by Mobility specialist  $Mobility charge 1 Mobility   Received pt coming out of BR, had no complaints and agreeable to mobility. Asymptomatic throughout ambulation, pt returned back to bed w/call bell by side and all needs met.   Holland Falling Mobility Specialist Phone Number [phone]

## 2021-07-06 NOTE — Plan of Care (Signed)
  Problem: Education: Goal: Knowledge of General Education information will improve Description Including pain rating scale, medication(s)/side effects and non-pharmacologic comfort measures Outcome: Progressing   

## 2021-07-06 NOTE — Progress Notes (Signed)
PROGRESS NOTE    Yvette Gaines  VOH:[id number] DOB: [birth year] DOA: 06/29/2021 PCP: [name], DO    Chief Complaint  Patient presents with   Chest Pain   Weakness   Shortness of Breath   Drug Problem    Brief Narrative:  Yvette Gaines is a 50 y.o. 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   Patient presented to [facility] on 10/05 for worsening of left shoulder swelling and pain.  Was found to have abscess of left shoulder and left calf,  I&D was done on 10/06 and culture showed MRSA.[name]  Patient however signed out [place] after the surgery. On 10/16, patient came back to [facility] complaining about new onset of chest pains and shortness of breath, CT chest showed multiple bilateral septic emboli, echocardiogram showed new onset of tricuspid vegetation compatible with endocarditis.  Blood culture showed again MRSA.  Patient was started on vancomycin and cefepime since. During hospital stay, patient was also found to develop acute thrombocytopenia, DIC was ruled out.  Patient was admitted to [facility], and started on IV vancomycin, TEE with tricuspid leaflet vegetation.     Assessment & Plan:   Principal Problem:   MRSA bacteremia Active Problems:   Severe opioid use disorder ([place])   Endocarditis   Septic pulmonary embolism (HCC)   Chronic viral hepatitis C (HCC)   Shoulder abscess   Calf abscess   Odynophagia   Protein-calorie malnutrition, severe   MRSA bacteremia/MRSA TV endocarditis/septic emboli/recent right shoulder and right calf abscess status post I&D 10/6 at [facility] in a patient with IV drug use. -Blood cultures positive for MRSA on 10/21, 10/22.   -Blood cultures 1/2 positive from 10/24. -Blood cultures 1/2 positive from 10/25. -TEE  consistent with tricuspid valve endocarditis.  Seen by CT surgery.  Decided against any need for surgery. -Not a candidate to go home with PICC line, last drug use a week ago.   May stay in the hospital to complete antibiotic therapy.   Thrombocytopenia: Resolved.   Recent right shoulder abscess -Status post I&D on 10/06 at [facility], according to records and imaging study and orthopedic note, there was no infection on [facility] joint itself, and patient recovered well. No acute issue now.   Severe protein calorie malnutrition Nutrition Status: Nutrition Problem: Severe Malnutrition Etiology: chronic illness, cancer and cancer related treatments Signs/Symptoms: severe fat depletion, severe muscle depletion Interventions: Ensure Enlive (each supplement provides 350kcal and 20 grams of protein), Magic cup, MVI    Mild intermittent asthma -At baseline.  As needed bronchodilator.   Cigarette smoking -Nicotine patch.  Hypokalemia, resolved.  IV drug abuse -She wants to quit.  Started on Suboxone 1.5 mg daily.  Currently no evidence of withdrawals.  Normocytic anemia -This is most likely anemia of acute illness/anemia of chronic disease, anemia panel with normal I94, folic acid, and ferritin level, iron level is low. -Transfuse for hemoglobin less than 7.     +HCV AB -Quantitative RNA pending.  May need acute treatment.  DVT prophylaxis: Heparin Code Status: Full Family Communication: None at bedside Disposition:   Status is: Inpatient  Remains inpatient appropriate because: Bacteremia with need for IV antibiotics       Consultants:  ID CT surgery   Subjective:  No complaints today.  Afebrile.  Objective: Vitals:  07/05/21 1500 07/05/21 2022 07/06/21 0516 07/06/21 0729  BP: 125/77 132/80 (!) 143/96 123/82  Pulse: 87 88 81 79  Resp: 18 16 18 17   Temp: 99.5 F (37.5 C) 98.7 F (37.1 C) 97.8 F (36.6 C) 98.3 F (36.8 C)  TempSrc: Oral Oral Oral Oral  SpO2: 100%  99% 100% 100%  Weight:      Height:        Intake/Output Summary (Last 24 hours) at 07/06/2021 1133 Last data filed at 07/06/2021 0626 Gross per 24 hour  Intake 928.21 ml  Output --  Net 928.21 ml    Filed Weights   06/29/21 1100 07/03/21 1237  Weight: 60.3 kg 59 kg    Examination:  General: Looks comfortable.  Walking around in the room.  Denies any complaints. Cardiovascular: S1-S2 normal.  Regular rate rhythm. Respiratory: Bilateral clear.  No added sounds. Gastrointestinal: Soft and nontender.  Bowel sounds present. Ext: No cyanosis or swelling.  No edema. Neuro: No focal deficits.     Data Reviewed: I have personally reviewed following labs and imaging studies  CBC: Recent Labs  Lab 06/30/21 0127 07/01/21 0155 07/02/21 0101 07/04/21 0135  WBC 16.3* 12.0* 9.2 10.5  HGB 7.6* 7.1* 8.1* 7.8*  HCT 23.2* 21.8* 25.3* 24.0*  MCV 84.4 84.8 87.2 87.6  PLT 404* 402* 502* 548*    Basic Metabolic Panel: Recent Labs  Lab 06/30/21 0127 07/01/21 0155 07/02/21 0101 07/04/21 0135  NA 130* 129* 132* 134*  K 3.2* 3.7 4.0 4.1  CL 103 102 105 107  CO2 19* 19* 19* 21*  GLUCOSE 99 132* 98 87  BUN 11 13 14 13   CREATININE 0.65 0.66 0.59 0.62  CALCIUM 7.3* 7.4* 7.4* 7.7*  MG  --  1.9  --   --   PHOS  --  2.6 4.0  --     GFR: Estimated Creatinine Clearance: 66.5 mL/min (by C-G formula based on SCr of 0.62 mg/dL).  Liver Function Tests: No results for input(s): AST, ALT, ALKPHOS, BILITOT, PROT, ALBUMIN in the last 168 hours.   CBG: Recent Labs  Lab 07/03/21 2059  GLUCAP 117*     Recent Results (from the past 240 hour(s))  Blood culture (routine x 2)     Status: Abnormal   Collection Time: 06/29/21  9:54 AM   Specimen: BLOOD RIGHT HAND  Result Value Ref Range Status   Specimen Description BLOOD RIGHT HAND  Final   Special Requests   Final    BOTTLES DRAWN AEROBIC ONLY Blood Culture results may not be optimal due to an inadequate volume of blood received  in culture bottles   Culture  Setup Time   Final    GRAM POSITIVE COCCI AEROBIC BOTTLE ONLY CRITICAL RESULT CALLED TO, READ BACK BY AND VERIFIED WITH: G,BARR PHARMD @1338  06/30/21 EB Performed at [facility], [place] [address] [place], [place] [address]    Culture METHICILLIN RESISTANT STAPHYLOCOCCUS AUREUS (A)  Final   Report Status 07/02/2021 FINAL  Final   Organism ID, Bacteria METHICILLIN RESISTANT STAPHYLOCOCCUS AUREUS  Final      Susceptibility   Methicillin resistant staphylococcus aureus - MIC*    CIPROFLOXACIN <=0.5 SENSITIVE Sensitive     ERYTHROMYCIN >=8 RESISTANT Resistant     GENTAMICIN <=0.5 SENSITIVE Sensitive     OXACILLIN >=4 RESISTANT Resistant     TETRACYCLINE <=1 SENSITIVE Sensitive     VANCOMYCIN 1 SENSITIVE Sensitive     TRIMETH/SULFA <=10 SENSITIVE Sensitive  CLINDAMYCIN <=0.25 SENSITIVE Sensitive     RIFAMPIN <=0.5 SENSITIVE Sensitive     Inducible Clindamycin NEGATIVE Sensitive     * METHICILLIN RESISTANT STAPHYLOCOCCUS AUREUS  Blood Culture ID Panel (Reflexed)     Status: Abnormal   Collection Time: 06/29/21  9:54 AM  Result Value Ref Range Status   Enterococcus faecalis NOT DETECTED NOT DETECTED Final   Enterococcus Faecium NOT DETECTED NOT DETECTED Final   Listeria monocytogenes NOT DETECTED NOT DETECTED Final   Staphylococcus species DETECTED (A) NOT DETECTED Final    Comment: CRITICAL RESULT CALLED TO, READ BACK BY AND VERIFIED WITH: G,BARR PHARMD @1338  06/30/21 EB    Staphylococcus aureus (BCID) DETECTED (A) NOT DETECTED Final    Comment: Methicillin (oxacillin)-resistant Staphylococcus aureus (MRSA). MRSA is predictably resistant to beta-lactam antibiotics (except ceftaroline). Preferred therapy is vancomycin unless clinically contraindicated. Patient requires contact precautions if  hospitalized. CRITICAL RESULT CALLED TO, READ BACK BY AND VERIFIED WITH: G,BARR PHARMD @1338  06/30/21 EB    Staphylococcus epidermidis NOT DETECTED NOT  DETECTED Final   Staphylococcus lugdunensis NOT DETECTED NOT DETECTED Final   Streptococcus species NOT DETECTED NOT DETECTED Final   Streptococcus agalactiae NOT DETECTED NOT DETECTED Final   Streptococcus pneumoniae NOT DETECTED NOT DETECTED Final   Streptococcus pyogenes NOT DETECTED NOT DETECTED Final   A.calcoaceticus-baumannii NOT DETECTED NOT DETECTED Final   Bacteroides fragilis NOT DETECTED NOT DETECTED Final   Enterobacterales NOT DETECTED NOT DETECTED Final   Enterobacter cloacae complex NOT DETECTED NOT DETECTED Final   Escherichia coli NOT DETECTED NOT DETECTED Final   Klebsiella aerogenes NOT DETECTED NOT DETECTED Final   Klebsiella oxytoca NOT DETECTED NOT DETECTED Final   Klebsiella pneumoniae NOT DETECTED NOT DETECTED Final   Proteus species NOT DETECTED NOT DETECTED Final   Salmonella species NOT DETECTED NOT DETECTED Final   Serratia marcescens NOT DETECTED NOT DETECTED Final   Haemophilus influenzae NOT DETECTED NOT DETECTED Final   Neisseria meningitidis NOT DETECTED NOT DETECTED Final   Pseudomonas aeruginosa NOT DETECTED NOT DETECTED Final   Stenotrophomonas maltophilia NOT DETECTED NOT DETECTED Final   Candida albicans NOT DETECTED NOT DETECTED Final   Candida auris NOT DETECTED NOT DETECTED Final   Candida glabrata NOT DETECTED NOT DETECTED Final   Candida krusei NOT DETECTED NOT DETECTED Final   Candida parapsilosis NOT DETECTED NOT DETECTED Final   Candida tropicalis NOT DETECTED NOT DETECTED Final   Cryptococcus neoformans/gattii NOT DETECTED NOT DETECTED Final   Meth resistant mecA/C and MREJ DETECTED (A) NOT DETECTED Final    Comment: CRITICAL RESULT CALLED TO, READ BACK BY AND VERIFIED WITH: G,BARR PHARMD @1338  06/30/21 EB Performed at [facility], [address] [place], [place] [address]   Resp Panel by RT-PCR (Flu A&B, Covid) Nasopharyngeal Swab     Status: None   Collection Time: 06/29/21  9:55 AM   Specimen: Nasopharyngeal Swab;  Nasopharyngeal(NP) swabs in vial transport medium  Result Value Ref Range Status   SARS Coronavirus 2 by RT PCR NEGATIVE NEGATIVE Final    Comment: (NOTE) SARS-CoV-2 target nucleic acids are NOT DETECTED.  The SARS-CoV-2 RNA is generally detectable in upper respiratory specimens during the acute phase of infection. The lowest concentration of SARS-CoV-2 viral copies this assay can detect is 138 copies/mL. A negative result does not preclude SARS-Cov-2 infection and should not be used as the sole basis for treatment or other patient management decisions. A negative result may occur with  improper specimen collection/handling, submission of specimen other  than nasopharyngeal swab, presence of viral mutation(s) within the areas targeted by this assay, and inadequate number of viral copies(<138 copies/mL). A negative result must be combined with clinical observations, patient history, and epidemiological information. The expected result is Negative.  Fact Sheet for Patients:  EntrepreneurPulse.com.au  Fact Sheet for Healthcare Providers:  IncredibleEmployment.be  This test is no t yet approved or cleared by the [place] FDA and  has been authorized for detection and/or diagnosis of SARS-CoV-2 by FDA under an Emergency Use Authorization (EUA). This EUA will remain  in effect (meaning this test can be used) for the duration of the COVID-19 declaration under Section 564(b)(1) of the Act, 21 U.S.C.section 360bbb-3(b)(1), unless the authorization is terminated  or revoked sooner.       Influenza A by PCR NEGATIVE NEGATIVE Final   Influenza B by PCR NEGATIVE NEGATIVE Final    Comment: (NOTE) The Xpert Xpress SARS-CoV-2/FLU/RSV plus assay is intended as an aid in the diagnosis of influenza from Nasopharyngeal swab specimens and should not be used as a sole basis for treatment. Nasal washings and aspirates are unacceptable for Xpert Xpress  SARS-CoV-2/FLU/RSV testing.  Fact Sheet for Patients: EntrepreneurPulse.com.au  Fact Sheet for Healthcare Providers: IncredibleEmployment.be  This test is not yet approved or cleared by the [place] FDA and has been authorized for detection and/or diagnosis of SARS-CoV-2 by FDA under an Emergency Use Authorization (EUA). This EUA will remain in effect (meaning this test can be used) for the duration of the COVID-19 declaration under Section 564(b)(1) of the Act, 21 U.S.C. section 360bbb-3(b)(1), unless the authorization is terminated or revoked.  Performed at [facility], [place] [address], [place], [place] [address]   Blood culture (routine x 2)     Status: Abnormal   Collection Time: 06/29/21  3:15 PM   Specimen: BLOOD  Result Value Ref Range Status   Specimen Description BLOOD BLOOD LEFT FOREARM  Final   Special Requests   Final    BOTTLES DRAWN AEROBIC AND ANAEROBIC Blood Culture adequate volume   Culture  Setup Time   Final    GRAM POSITIVE COCCI ANAEROBIC BOTTLE ONLY CRITICAL VALUE NOTED.  VALUE IS CONSISTENT WITH PREVIOUSLY REPORTED AND CALLED VALUE.    Culture (A)  Final    STAPHYLOCOCCUS AUREUS SUSCEPTIBILITIES PERFORMED ON PREVIOUS CULTURE WITHIN THE LAST 5 DAYS. Performed at [facility], [place] [address], [place], [place] [address]    Report Status 07/03/2021 FINAL  Final  Culture, blood (routine x 2)     Status: Abnormal   Collection Time: 06/30/21  6:50 PM   Specimen: BLOOD  Result Value Ref Range Status   Specimen Description BLOOD LEFT ANTECUBITAL  Final   Special Requests   Final    BOTTLES DRAWN AEROBIC ONLY Blood Culture results may not be optimal due to an inadequate volume of blood received in culture bottles   Culture  Setup Time   Final    GRAM POSITIVE COCCI IN CLUSTERS AEROBIC BOTTLE ONLY CRITICAL VALUE NOTED.  VALUE IS CONSISTENT WITH PREVIOUSLY REPORTED AND CALLED VALUE.    Culture (A)  Final     STAPHYLOCOCCUS AUREUS SUSCEPTIBILITIES PERFORMED ON PREVIOUS CULTURE WITHIN THE LAST 5 DAYS. Performed at [facility], [place] [address], [place], [place] [address]    Report Status 07/02/2021 FINAL  Final  Culture, blood (routine x 2)     Status: Abnormal   Collection Time: 06/30/21  6:55 PM   Specimen: BLOOD  Result Value Ref Range Status   Specimen Description BLOOD LEFT ANTECUBITAL  Final   Special Requests   Final    BOTTLES DRAWN AEROBIC ONLY Blood Culture results may not be optimal due to an inadequate volume of blood received in culture bottles   Culture  Setup Time   Final    GRAM POSITIVE COCCI IN CLUSTERS AEROBIC BOTTLE ONLY CRITICAL VALUE NOTED.  VALUE IS CONSISTENT WITH PREVIOUSLY REPORTED AND CALLED VALUE.    Culture (A)  Final    STAPHYLOCOCCUS AUREUS SUSCEPTIBILITIES PERFORMED ON PREVIOUS CULTURE WITHIN THE LAST 5 DAYS. Performed at [facility], [place] [address], [place], [place] [address]    Report Status 07/02/2021 FINAL  Final  Culture, blood (routine x 2)     Status: Abnormal (Preliminary result)   Collection Time: 07/02/21  8:50 AM   Specimen: BLOOD  Result Value Ref Range Status   Specimen Description BLOOD SITE NOT SPECIFIED  Final   Special Requests IN PEDIATRIC BOTTLE Blood Culture adequate volume  Final   Culture  Setup Time   Final    GRAM POSITIVE COCCI AEROBIC BOTTLE ONLY CRITICAL VALUE NOTED.  VALUE IS CONSISTENT WITH PREVIOUSLY REPORTED AND CALLED VALUE.    Culture (A)  Final    STAPHYLOCOCCUS AUREUS Sent to [place] for further susceptibility testing. Performed at [facility], [place] [address] [place], [place] [address]    Report Status PENDING  Incomplete  Culture, blood (routine x 2)     Status: None (Preliminary result)   Collection Time: 07/02/21 11:50 AM   Specimen: BLOOD RIGHT FOREARM  Result Value Ref Range Status   Specimen Description BLOOD RIGHT FOREARM  Final   Special Requests   Final    BOTTLES DRAWN AEROBIC  AND ANAEROBIC Blood Culture adequate volume   Culture   Final    NO GROWTH 3 DAYS Performed at [facility], [place] [address] [place], [place] [address]    Report Status PENDING  Incomplete  Culture, blood (routine x 2)     Status: Abnormal (Preliminary result)   Collection Time: 07/03/21  8:06 PM   Specimen: BLOOD  Result Value Ref Range Status   Specimen Description BLOOD RIGHT ARM  Final   Special Requests   Final    BOTTLES DRAWN AEROBIC AND ANAEROBIC Blood Culture adequate volume   Culture  Setup Time   Final    GRAM POSITIVE COCCI IN CLUSTERS ANAEROBIC BOTTLE ONLY CRITICAL VALUE NOTED.  VALUE IS CONSISTENT WITH PREVIOUSLY REPORTED AND CALLED VALUE.    Culture (A)  Final    STAPHYLOCOCCUS AUREUS SUSCEPTIBILITIES PERFORMED ON PREVIOUS CULTURE WITHIN THE LAST 5 DAYS. Performed at [facility], [place] [address] [place], [place] [address]    Report Status PENDING  Incomplete  Culture, blood (routine x 2)     Status: None (Preliminary result)   Collection Time: 07/03/21  8:06 PM   Specimen: BLOOD  Result Value Ref Range Status   Specimen Description BLOOD LEFT ARM  Final   Special Requests   Final    BOTTLES DRAWN AEROBIC AND ANAEROBIC Blood Culture adequate volume   Culture   Final    NO GROWTH 2 DAYS Performed at [facility], [place] [address], [place],  [address]    Report Status PENDING  Incomplete         Radiology Studies: No results found.      Scheduled Meds:  buprenorphine-naloxone  1.5 tablet  Sublingual Daily   enoxaparin (LOVENOX) injection  40 mg Subcutaneous Q24H   famotidine  20 mg Oral Daily   feeding supplement  237 mL Oral TID BM   magic mouthwash  5 mL Oral QID   multivitamin with minerals  1 tablet Oral Daily   nicotine  21 mg Transdermal Daily   valACYclovir  1,000 mg Oral BID   Continuous Infusions:  ceFTAROline (TEFLARO) IV       LOS: 7 days   Total time spent: 30 minutes    Barb Merino, MD [facility]  07/06/2021, 11:33 AM

## 2021-07-06 NOTE — Progress Notes (Signed)
[facility] for Infectious Disease  Date of Admission:  06/29/2021           Reason for visit: Follow up on MRSA endocarditis  Current antibiotics: Vancomycin 10/21-present Valtrex 10/23--present    ASSESSMENT & RECOMMENDATIONS:    50 y.o. female admitted with:  # MRSA bacteremia and TV endocarditis with pulmonary septic emboli.  Blood cultures continue to be persistently positive from 10/25 with staph aureus and she continues on vancomycin monotherapy. -- follow up 10/27 blood cx -- will do ceftaroline and daptomycin dual therapy given persistent bacteremia -- stop vancomycin -- CVTS plans noted with currently no surgical indication and vegetation too small for Angiovac  # Shoulder and calf abscess: S/p I&D at OSH.  No current issues. -- monitor  # HCV Ab positive: RNA pending. 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 -- continue valtrex x 7 days total     Principal Problem:   MRSA bacteremia Active Problems:   Severe opioid use disorder (HCC)   Endocarditis   Septic pulmonary embolism (HCC)   Chronic viral hepatitis C (HCC)   Shoulder abscess   Calf abscess   Odynophagia   Protein-calorie malnutrition, severe    MEDICATIONS:    Scheduled Meds:  buprenorphine-naloxone  1.5 tablet Sublingual Daily   enoxaparin (LOVENOX) injection  40 mg Subcutaneous Q24H   famotidine  20 mg Oral Daily   feeding supplement  237 mL Oral TID BM   magic mouthwash  5 mL Oral QID   multivitamin with minerals  1 tablet Oral Daily   nicotine  21 mg Transdermal Daily   valACYclovir  1,000 mg Oral BID   Continuous Infusions:  vancomycin Stopped (07/05/21 2238)   PRN Meds:.acetaminophen **OR** acetaminophen, albuterol, ALPRAZolam, influenza vac split quadrivalent PF, ondansetron **OR** ondansetron (ZOFRAN) IV  SUBJECTIVE:   24 hour events:  No acute events noted Tmax 99.5 HCV RNA in process 10/25 blood cx = Staph  aureus  She is feeling better today.  Oral lesions much better and no longer having painful swallowing.  She is tolerating abx.  Doing well on suboxone.  She is interested in NA resources.   Review of Systems  All other systems reviewed and are negative.    OBJECTIVE:   Blood pressure 123/82, pulse 79, temperature 98.3 F (36.8 C), temperature source Oral, resp. rate 17, height 5\' 2"  (1.575 m), weight 59 kg, SpO2 100 %. Body mass index is 23.78 kg/m.  Physical Exam Constitutional:      General: She is not in acute distress.    Appearance: Normal appearance.  HENT:     Head: Normocephalic and atraumatic.  Pulmonary:     Effort: Pulmonary effort is normal. No respiratory distress.  Abdominal:     General: There is no distension.     Palpations: Abdomen is soft.  Musculoskeletal:     Cervical back: Normal range of motion and neck supple.  Neurological:     General: No focal deficit present.     Mental Status: She is alert and oriented to person, place, and time.  Psychiatric:        Mood and Affect: Mood normal.        Behavior: Behavior normal.     Lab Results: Lab Results  Component Value Date   WBC 10.5 07/04/2021   HGB 7.8 (L) 07/04/2021   HCT 24.0 (L) 07/04/2021   MCV 87.6 07/04/2021  PLT 548 (H) 07/04/2021    Lab Results  Component Value Date   NA 134 (L) 07/04/2021   K 4.1 07/04/2021   CO2 21 (L) 07/04/2021   GLUCOSE 87 07/04/2021   BUN 13 07/04/2021   CREATININE 0.62 07/04/2021   CALCIUM 7.7 (L) 07/04/2021   GFRNONAA >60 07/04/2021   GFRAA >60 06/28/2015    Lab Results  Component Value Date   ALT 50 (H) 06/29/2021   AST 40 06/29/2021   ALKPHOS 167 (H) 06/29/2021   BILITOT 1.2 06/29/2021       Component Value Date/Time   CRP 17.6 (H) 06/29/2021 1657       Component Value Date/Time   ESRSEDRATE 35 (H) 06/30/2021 0127     I have reviewed the micro and lab results in Epic.  Imaging: No results found.   Imaging independently reviewed  in Epic.    Raynelle [facility] for Infectious Disease [facility] [phone] pager 07/06/2021, 9:55 AM  I spent greater than 35 minutes with the patient including greater than 50% of time in face to face counsel of the patient and in coordination of their care.

## 2021-07-06 NOTE — Progress Notes (Addendum)
Pharmacy Antibiotic Note  Yvette Gaines is a 50 y.o. female admitted on 06/29/2021 with MRSA  bacteremia, TV endocarditis and septic emboli. Patient with persistently positive cultures on vancomycin, switched to ceftaroline + daptomycin per ID.  Pharmacy has been consulted for daptomycin dosing.  10/27 culture no growth so far. Renal function stable. Baseline CK ordered.  Plan: Stop vancomycin Ceftraoline 600mg  Q8 hr per ID Daptomycin 500mg  Q24hr  F/u CK Q Friday F/u duration per ID    Height: 5\' 2"  (157.5 cm) Weight: 59 kg (130 lb) IBW/kg (Calculated) : 50.1  Temp (24hrs), Avg:98.6 F (37 C), Min:97.8 F (36.6 C), Max:99.5 F (37.5 C)  Recent Labs  Lab 06/29/21 1515 06/30/21 0127 07/01/21 0155 07/02/21 0101 07/02/21 0850 07/04/21 0135  WBC  --  16.3* 12.0* 9.2  --  10.5  CREATININE  --  0.65 0.66 0.59  --  0.62  LATICACIDVEN 1.6  --   --   --   --   --   VANCOTROUGH  --   --   --   --  11*  --   VANCOPEAK  --   --   --  23*  --   --     Estimated Creatinine Clearance: 66.5 mL/min (by C-G formula based on SCr of 0.62 mg/dL).    No Known Allergies  Antimicrobials this admission: Valacyclovir 10/23 >> 10/29 Vanc 10/21 >> 10/28 Dapto 10/28>>  Ceftraoline 10/28 >>   10/28 CK pending   Microbiology results: 10/21 Bcx 3/3: MRSA 10/22 Bcx 2/2: MRSA  10/24 Bcx 1/3: MRSA  10/25 Bcx 1/4: MRSA  10/27 Bcx: NGTD    Thank you for allowing pharmacy to be a part of this patient's care.  Benetta Spar, PharmD, BCPS, BCCP Clinical Pharmacist  Please check AMION for all [facility] phone numbers After 10:00 PM, call [place] [phone]

## 2021-07-07 DIAGNOSIS — I330 Acute and subacute infective endocarditis: Secondary | ICD-10-CM | POA: Diagnosis not present

## 2021-07-07 DIAGNOSIS — R7881 Bacteremia: Secondary | ICD-10-CM | POA: Diagnosis not present

## 2021-07-07 DIAGNOSIS — L02419 Cutaneous abscess of limb, unspecified: Secondary | ICD-10-CM | POA: Diagnosis not present

## 2021-07-07 DIAGNOSIS — B182 Chronic viral hepatitis C: Secondary | ICD-10-CM | POA: Diagnosis not present

## 2021-07-07 DIAGNOSIS — B9562 Methicillin resistant Staphylococcus aureus infection as the cause of diseases classified elsewhere: Secondary | ICD-10-CM | POA: Diagnosis not present

## 2021-07-07 LAB — CULTURE, BLOOD (ROUTINE X 2)
Culture: NO GROWTH
Special Requests: ADEQUATE
Special Requests: ADEQUATE

## 2021-07-07 NOTE — Progress Notes (Signed)
PROGRESS NOTE    [name]  DEY:[id number] DOB: [birth year] DOA: 06/29/2021 PCP: [name], DO    Chief Complaint  Patient presents with   Chest Pain   Weakness   Shortness of Breath   Drug Problem    Brief Narrative:  Yvette Gaines is a 50 y.o. 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   Patient presented to [facility] on 10/05 for worsening of left shoulder swelling and pain.  Was found to have abscess of left shoulder and left calf,  I&D was done on 10/06 and culture showed MRSA.[name]  Patient however signed out [name] after the surgery. On 10/16, patient came back to [facility] complaining about new onset of chest pains and shortness of breath, CT chest showed multiple bilateral septic emboli, echocardiogram showed new onset of tricuspid vegetation compatible with endocarditis.  Blood culture showed again MRSA.  Patient was started on vancomycin and cefepime since. During hospital stay, patient was also found to develop acute thrombocytopenia, DIC was ruled out.  Patient was admitted to [facility], and started on IV vancomycin, TEE with tricuspid leaflet vegetation.     Assessment & Plan:   Principal Problem:   MRSA bacteremia Active Problems:   Severe opioid use disorder ([place])   Endocarditis   Septic pulmonary embolism (HCC)   Chronic viral hepatitis C (HCC)   Shoulder abscess   Calf abscess   Odynophagia   Protein-calorie malnutrition, severe   MRSA bacteremia/MRSA TV endocarditis/septic emboli/recent right shoulder and right calf abscess status post I&D 10/6 at [facility] in a patient with IV drug use. -Blood cultures positive for MRSA on 10/21, 10/22.   -Blood cultures 1/2 positive from 10/24. -Blood cultures 1/2 positive from 10/25. -Blood  cultures 2 out of 2 positive from 10/27. -TEE consistent with tricuspid valve endocarditis.  Seen by CT surgery.  Decided against any need for surgery. -Not a candidate to go home with PICC line, last drug use a week ago.   Was on vancomycin.  Currently remains on dual therapy with Teflaro and daptomycin.  Followed by ID.  Thrombocytopenia: Resolved.   Recent right shoulder abscess -Status post I&D on 10/06 at [facility], according to records and imaging study and orthopedic note, there was no infection on [facility] joint itself, and patient recovered well. No acute issue now.   Severe protein calorie malnutrition Nutrition Status: Nutrition Problem: Severe Malnutrition Etiology: chronic illness, cancer and cancer related treatments Signs/Symptoms: severe fat depletion, severe muscle depletion Interventions: Ensure Enlive (each supplement provides 350kcal and 20 grams of protein), Magic cup, MVI    Mild intermittent asthma -At baseline.  As needed bronchodilator.   Cigarette smoking -Nicotine patch.  Hypokalemia, resolved.  IV drug abuse -She wants to quit.  Started on Suboxone 1.5 mg daily.  Currently no evidence of withdrawals.  Normocytic anemia -This is most likely anemia of acute illness/anemia of chronic disease, anemia panel with normal D14, folic acid, and ferritin level, iron level is low. -Transfuse for hemoglobin less than 7.     +HCV AB -Quantitative RNA pending.  May need treatment.  DVT prophylaxis: Heparin Code Status: Full Family Communication: None at bedside Disposition:   Status is: Inpatient  Remains inpatient appropriate because: Bacteremia with need for IV antibiotics       Consultants:  ID CT  surgery   Subjective:  Seen and examined.  No complaints.  Objective: Vitals:   07/06/21 1723 07/06/21 1947 07/07/21 0408 07/07/21 0835  BP: 128/82 125/79 129/77 123/88  Pulse: 95 95 92 84  Resp: 19 18 18 18   Temp: 98.2 F (36.8 C) 98.5 F (36.9 C)  99 F (37.2 C) 97.9 F (36.6 C)  TempSrc: Oral Oral Oral Oral  SpO2: 100% 99% 98% 100%  Weight:      Height:        Intake/Output Summary (Last 24 hours) at 07/07/2021 1329 Last data filed at 07/07/2021 0409 Gross per 24 hour  Intake 880 ml  Output --  Net 880 ml    Filed Weights   06/29/21 1100 07/03/21 1237  Weight: 60.3 kg 59 kg    Examination:  General: Looks comfortable.  On room air. Cardiovascular: S1-S2 normal.  Regular rate rhythm. Respiratory: Bilateral clear.  No added sounds. Gastrointestinal: Soft and nontender.  Bowel sounds present. Ext: No cyanosis or swelling.  No edema. Neuro: No focal deficits.     Data Reviewed: I have personally reviewed following labs and imaging studies  CBC: Recent Labs  Lab 07/01/21 0155 07/02/21 0101 07/04/21 0135  WBC 12.0* 9.2 10.5  HGB 7.1* 8.1* 7.8*  HCT 21.8* 25.3* 24.0*  MCV 84.8 87.2 87.6  PLT 402* 502* 548*    Basic Metabolic Panel: Recent Labs  Lab 07/01/21 0155 07/02/21 0101 07/04/21 0135  NA 129* 132* 134*  K 3.7 4.0 4.1  CL 102 105 107  CO2 19* 19* 21*  GLUCOSE 132* 98 87  BUN 13 14 13   CREATININE 0.66 0.59 0.62  CALCIUM 7.4* 7.4* 7.7*  MG 1.9  --   --   PHOS 2.6 4.0  --     GFR: Estimated Creatinine Clearance: 66.5 mL/min (by C-G formula based on SCr of 0.62 mg/dL).  Liver Function Tests: No results for input(s): AST, ALT, ALKPHOS, BILITOT, PROT, ALBUMIN in the last 168 hours.   CBG: Recent Labs  Lab 07/03/21 2059  GLUCAP 117*     Recent Results (from the past 240 hour(s))  Blood culture (routine x 2)     Status: Abnormal   Collection Time: 06/29/21  9:54 AM   Specimen: BLOOD RIGHT HAND  Result Value Ref Range Status   Specimen Description BLOOD RIGHT HAND  Final   Special Requests   Final    BOTTLES DRAWN AEROBIC ONLY Blood Culture results may not be optimal due to an inadequate volume of blood received in culture bottles   Culture  Setup Time   Final    GRAM POSITIVE  COCCI AEROBIC BOTTLE ONLY CRITICAL RESULT CALLED TO, READ BACK BY AND VERIFIED WITH: G,BARR PHARMD @1338  06/30/21 EB Performed at [facility], [place] [address] [place], [place] [address]    Culture METHICILLIN RESISTANT STAPHYLOCOCCUS AUREUS (A)  Final   Report Status 07/02/2021 FINAL  Final   Organism ID, Bacteria METHICILLIN RESISTANT STAPHYLOCOCCUS AUREUS  Final      Susceptibility   Methicillin resistant staphylococcus aureus - MIC*    CIPROFLOXACIN <=0.5 SENSITIVE Sensitive     ERYTHROMYCIN >=8 RESISTANT Resistant     GENTAMICIN <=0.5 SENSITIVE Sensitive     OXACILLIN >=4 RESISTANT Resistant     TETRACYCLINE <=1 SENSITIVE Sensitive     VANCOMYCIN 1 SENSITIVE Sensitive     TRIMETH/SULFA <=10 SENSITIVE Sensitive     CLINDAMYCIN <=0.25 SENSITIVE Sensitive     RIFAMPIN <=0.5 SENSITIVE Sensitive  Inducible Clindamycin NEGATIVE Sensitive     * METHICILLIN RESISTANT STAPHYLOCOCCUS AUREUS  Blood Culture ID Panel (Reflexed)     Status: Abnormal   Collection Time: 06/29/21  9:54 AM  Result Value Ref Range Status   Enterococcus faecalis NOT DETECTED NOT DETECTED Final   Enterococcus Faecium NOT DETECTED NOT DETECTED Final   Listeria monocytogenes NOT DETECTED NOT DETECTED Final   Staphylococcus species DETECTED (A) NOT DETECTED Final    Comment: CRITICAL RESULT CALLED TO, READ BACK BY AND VERIFIED WITH: G,BARR PHARMD @1338  06/30/21 EB    Staphylococcus aureus (BCID) DETECTED (A) NOT DETECTED Final    Comment: Methicillin (oxacillin)-resistant Staphylococcus aureus (MRSA). MRSA is predictably resistant to beta-lactam antibiotics (except ceftaroline). Preferred therapy is vancomycin unless clinically contraindicated. Patient requires contact precautions if  hospitalized. CRITICAL RESULT CALLED TO, READ BACK BY AND VERIFIED WITH: G,BARR PHARMD @1338  06/30/21 EB    Staphylococcus epidermidis NOT DETECTED NOT DETECTED Final   Staphylococcus lugdunensis NOT DETECTED NOT DETECTED  Final   Streptococcus species NOT DETECTED NOT DETECTED Final   Streptococcus agalactiae NOT DETECTED NOT DETECTED Final   Streptococcus pneumoniae NOT DETECTED NOT DETECTED Final   Streptococcus pyogenes NOT DETECTED NOT DETECTED Final   A.calcoaceticus-baumannii NOT DETECTED NOT DETECTED Final   Bacteroides fragilis NOT DETECTED NOT DETECTED Final   Enterobacterales NOT DETECTED NOT DETECTED Final   Enterobacter cloacae complex NOT DETECTED NOT DETECTED Final   Escherichia coli NOT DETECTED NOT DETECTED Final   Klebsiella aerogenes NOT DETECTED NOT DETECTED Final   Klebsiella oxytoca NOT DETECTED NOT DETECTED Final   Klebsiella pneumoniae NOT DETECTED NOT DETECTED Final   Proteus species NOT DETECTED NOT DETECTED Final   Salmonella species NOT DETECTED NOT DETECTED Final   Serratia marcescens NOT DETECTED NOT DETECTED Final   Haemophilus influenzae NOT DETECTED NOT DETECTED Final   Neisseria meningitidis NOT DETECTED NOT DETECTED Final   Pseudomonas aeruginosa NOT DETECTED NOT DETECTED Final   Stenotrophomonas maltophilia NOT DETECTED NOT DETECTED Final   Candida albicans NOT DETECTED NOT DETECTED Final   Candida auris NOT DETECTED NOT DETECTED Final   Candida glabrata NOT DETECTED NOT DETECTED Final   Candida krusei NOT DETECTED NOT DETECTED Final   Candida parapsilosis NOT DETECTED NOT DETECTED Final   Candida tropicalis NOT DETECTED NOT DETECTED Final   Cryptococcus neoformans/gattii NOT DETECTED NOT DETECTED Final   Meth resistant mecA/C and MREJ DETECTED (A) NOT DETECTED Final    Comment: CRITICAL RESULT CALLED TO, READ BACK BY AND VERIFIED WITH: G,BARR PHARMD @1338  06/30/21 EB Performed at [facility], [address], [place], [place] [address]   Resp Panel by RT-PCR (Flu A&B, Covid) Nasopharyngeal Swab     Status: None   Collection Time: 06/29/21  9:55 AM   Specimen: Nasopharyngeal Swab; Nasopharyngeal(NP) swabs in vial transport medium  Result Value Ref Range  Status   SARS Coronavirus 2 by RT PCR NEGATIVE NEGATIVE Final    Comment: (NOTE) SARS-CoV-2 target nucleic acids are NOT DETECTED.  The SARS-CoV-2 RNA is generally detectable in upper respiratory specimens during the acute phase of infection. The lowest concentration of SARS-CoV-2 viral copies this assay can detect is 138 copies/mL. A negative result does not preclude SARS-Cov-2 infection and should not be used as the sole basis for treatment or other patient management decisions. A negative result may occur with  improper specimen collection/handling, submission of specimen other than nasopharyngeal swab, presence of viral mutation(s) within the areas targeted by this assay, and inadequate  number of viral copies(<138 copies/mL). A negative result must be combined with clinical observations, patient history, and epidemiological information. The expected result is Negative.  Fact Sheet for Patients:  EntrepreneurPulse.com.au  Fact Sheet for Healthcare Providers:  IncredibleEmployment.be  This test is no t yet approved or cleared by the [place] FDA and  has been authorized for detection and/or diagnosis of SARS-CoV-2 by FDA under an Emergency Use Authorization (EUA). This EUA will remain  in effect (meaning this test can be used) for the duration of the COVID-19 declaration under Section 564(b)(1) of the Act, 21 U.S.C.section 360bbb-3(b)(1), unless the authorization is terminated  or revoked sooner.       Influenza A by PCR NEGATIVE NEGATIVE Final   Influenza B by PCR NEGATIVE NEGATIVE Final    Comment: (NOTE) The Xpert Xpress SARS-CoV-2/FLU/RSV plus assay is intended as an aid in the diagnosis of influenza from Nasopharyngeal swab specimens and should not be used as a sole basis for treatment. Nasal washings and aspirates are unacceptable for Xpert Xpress SARS-CoV-2/FLU/RSV testing.  Fact Sheet for  Patients: EntrepreneurPulse.com.au  Fact Sheet for Healthcare Providers: IncredibleEmployment.be  This test is not yet approved or cleared by the [place] FDA and has been authorized for detection and/or diagnosis of SARS-CoV-2 by FDA under an Emergency Use Authorization (EUA). This EUA will remain in effect (meaning this test can be used) for the duration of the COVID-19 declaration under Section 564(b)(1) of the Act, 21 U.S.C. section 360bbb-3(b)(1), unless the authorization is terminated or revoked.  Performed at [facility], [place] [address], [place], [place] [address]   Blood culture (routine x 2)     Status: Abnormal   Collection Time: 06/29/21  3:15 PM   Specimen: BLOOD  Result Value Ref Range Status   Specimen Description BLOOD BLOOD LEFT FOREARM  Final   Special Requests   Final    BOTTLES DRAWN AEROBIC AND ANAEROBIC Blood Culture adequate volume   Culture  Setup Time   Final    GRAM POSITIVE COCCI ANAEROBIC BOTTLE ONLY CRITICAL VALUE NOTED.  VALUE IS CONSISTENT WITH PREVIOUSLY REPORTED AND CALLED VALUE.    Culture (A)  Final    STAPHYLOCOCCUS AUREUS SUSCEPTIBILITIES PERFORMED ON PREVIOUS CULTURE WITHIN THE LAST 5 DAYS. Performed at [facility], [place] [address] [place], [place] [address]    Report Status 07/03/2021 FINAL  Final  Culture, blood (routine x 2)     Status: Abnormal   Collection Time: 06/30/21  6:50 PM   Specimen: BLOOD  Result Value Ref Range Status   Specimen Description BLOOD LEFT ANTECUBITAL  Final   Special Requests   Final    BOTTLES DRAWN AEROBIC ONLY Blood Culture results may not be optimal due to an inadequate volume of blood received in culture bottles   Culture  Setup Time   Final    GRAM POSITIVE COCCI IN CLUSTERS AEROBIC BOTTLE ONLY CRITICAL VALUE NOTED.  VALUE IS CONSISTENT WITH PREVIOUSLY REPORTED AND CALLED VALUE.    Culture (A)  Final    STAPHYLOCOCCUS AUREUS SUSCEPTIBILITIES  PERFORMED ON PREVIOUS CULTURE WITHIN THE LAST 5 DAYS. Performed at [facility], [place] [address], [place], [place] [address]    Report Status 07/02/2021 FINAL  Final  Culture, blood (routine x 2)     Status: Abnormal   Collection Time: 06/30/21  6:55 PM   Specimen: BLOOD  Result Value Ref Range Status   Specimen Description BLOOD LEFT ANTECUBITAL  Final  Special Requests   Final    BOTTLES DRAWN AEROBIC ONLY Blood Culture results may not be optimal due to an inadequate volume of blood received in culture bottles   Culture  Setup Time   Final    GRAM POSITIVE COCCI IN CLUSTERS AEROBIC BOTTLE ONLY CRITICAL VALUE NOTED.  VALUE IS CONSISTENT WITH PREVIOUSLY REPORTED AND CALLED VALUE.    Culture (A)  Final    STAPHYLOCOCCUS AUREUS SUSCEPTIBILITIES PERFORMED ON PREVIOUS CULTURE WITHIN THE LAST 5 DAYS. Performed at [facility], [place] [address], [place], [place] [address]    Report Status 07/02/2021 FINAL  Final  Culture, blood (routine x 2)     Status: Abnormal (Preliminary result)   Collection Time: 07/02/21  8:50 AM   Specimen: BLOOD  Result Value Ref Range Status   Specimen Description BLOOD SITE NOT SPECIFIED  Final   Special Requests IN PEDIATRIC BOTTLE Blood Culture adequate volume  Final   Culture  Setup Time   Final    GRAM POSITIVE COCCI AEROBIC BOTTLE ONLY CRITICAL VALUE NOTED.  VALUE IS CONSISTENT WITH PREVIOUSLY REPORTED AND CALLED VALUE.    Culture (A)  Final    STAPHYLOCOCCUS AUREUS Sent to [place] for further susceptibility testing. Performed at [facility], [place] [address] [place], [place] [address]    Report Status PENDING  Incomplete  Culture, blood (routine x 2)     Status: None   Collection Time: 07/02/21 11:50 AM   Specimen: BLOOD RIGHT FOREARM  Result Value Ref Range Status   Specimen Description BLOOD RIGHT FOREARM  Final   Special Requests   Final    BOTTLES DRAWN AEROBIC AND ANAEROBIC Blood Culture adequate volume   Culture   Final     NO GROWTH 5 DAYS Performed at [facility] [facility], [place] [address], [place], [place] [address]    Report Status 07/07/2021 FINAL  Final  Culture, blood (routine x 2)     Status: Abnormal   Collection Time: 07/03/21  8:06 PM   Specimen: BLOOD  Result Value Ref Range Status   Specimen Description BLOOD RIGHT ARM  Final   Special Requests   Final    BOTTLES DRAWN AEROBIC AND ANAEROBIC Blood Culture adequate volume   Culture  Setup Time   Final    GRAM POSITIVE COCCI IN CLUSTERS ANAEROBIC BOTTLE ONLY CRITICAL VALUE NOTED.  VALUE IS CONSISTENT WITH PREVIOUSLY REPORTED AND CALLED VALUE.    Culture (A)  Final    STAPHYLOCOCCUS AUREUS SUSCEPTIBILITIES PERFORMED ON PREVIOUS CULTURE WITHIN THE LAST 5 DAYS. Performed at [facility], [place] [address], [place], [place] [address]    Report Status 07/07/2021 FINAL  Final  Culture, blood (routine x 2)     Status: None (Preliminary result)   Collection Time: 07/03/21  8:06 PM   Specimen: BLOOD  Result Value Ref Range Status   Specimen Description BLOOD LEFT ARM  Final   Special Requests   Final    BOTTLES DRAWN AEROBIC AND ANAEROBIC Blood Culture adequate volume   Culture  Setup Time   Final    GRAM POSITIVE COCCI IN CLUSTERS ANAEROBIC BOTTLE ONLY CRITICAL VALUE NOTED.  VALUE IS CONSISTENT WITH PREVIOUSLY REPORTED AND CALLED VALUE. Performed at [facility], [place] [address], [place], [place] [address]    Culture GRAM POSITIVE COCCI  Final   Report Status PENDING  Incomplete  Culture, blood (Routine X 2) w Reflex to ID Panel  Status: Abnormal (Preliminary result)   Collection Time: 07/05/21  7:00 PM   Specimen: BLOOD RIGHT ARM  Result Value Ref Range Status   Specimen Description BLOOD RIGHT ARM  Final   Special Requests   Final    BOTTLES DRAWN AEROBIC AND ANAEROBIC Blood Culture adequate volume   Culture  Setup Time   Final    GRAM POSITIVE COCCI IN BOTH AEROBIC AND ANAEROBIC BOTTLES CRITICAL VALUE NOTED.  VALUE IS  CONSISTENT WITH PREVIOUSLY REPORTED AND CALLED VALUE.    Culture (A)  Final    STAPHYLOCOCCUS AUREUS SUSCEPTIBILITIES PERFORMED ON PREVIOUS CULTURE WITHIN THE LAST 5 DAYS. Performed at [facility], [place] [address] [place], [place] [address]    Report Status PENDING  Incomplete  Culture, blood (Routine X 2) w Reflex to ID Panel     Status: Abnormal (Preliminary result)   Collection Time: 07/05/21  7:06 PM   Specimen: BLOOD LEFT WRIST  Result Value Ref Range Status   Specimen Description BLOOD LEFT WRIST  Final   Special Requests   Final    BOTTLES DRAWN AEROBIC AND ANAEROBIC Blood Culture results may not be optimal due to an inadequate volume of blood received in culture bottles   Culture  Setup Time   Final    GRAM POSITIVE COCCI IN BOTH AEROBIC AND ANAEROBIC BOTTLES CRITICAL VALUE NOTED.  VALUE IS CONSISTENT WITH PREVIOUSLY REPORTED AND CALLED VALUE.    Culture (A)  Final    STAPHYLOCOCCUS AUREUS SUSCEPTIBILITIES PERFORMED ON PREVIOUS CULTURE WITHIN THE LAST 5 DAYS. Performed at [facility], [place] [address] [place], [place] [address]    Report Status PENDING  Incomplete         Radiology Studies: No results found.      Scheduled Meds:  buprenorphine-naloxone  1.5 tablet Sublingual Daily   enoxaparin (LOVENOX) injection  40 mg Subcutaneous Q24H   famotidine  20 mg Oral Daily   feeding supplement  237 mL Oral TID BM   magic mouthwash  5 mL Oral QID   multivitamin with minerals  1 tablet Oral Daily   nicotine  21 mg Transdermal Daily   valACYclovir  1,000 mg Oral BID   Continuous Infusions:  ceFTAROline (TEFLARO) IV 600 mg (07/07/21 0524)   DAPTOmycin (CUBICIN)  IV 500 mg (07/06/21 1503)     LOS: 8 days   Total time spent: 25 minutes.    Barb Merino, MD [facility]  07/07/2021, 1:29 PM

## 2021-07-07 NOTE — Progress Notes (Signed)
Subjective:   No new complaints   Antibiotics:  Anti-infectives (From admission, onward)    Start     Dose/Rate Route Frequency Ordered Stop   07/06/21 1400  ceftaroline (TEFLARO) 600 mg in sodium chloride 0.9 % 100 mL IVPB        600 mg 100 mL/hr over 60 Minutes Intravenous Every 8 hours 07/06/21 1033     07/06/21 1330  DAPTOmycin (CUBICIN) 500 mg in sodium chloride 0.9 % IVPB        500 mg 120 mL/hr over 30 Minutes Intravenous Daily 07/06/21 1233     07/01/21 2200  vancomycin (VANCOREADY) IVPB 750 mg/150 mL  Status:  Discontinued        750 mg 150 mL/hr over 60 Minutes Intravenous Every 12 hours 07/01/21 1314 07/06/21 1033   07/01/21 1400  valACYclovir (VALTREX) tablet 1,000 mg        1,000 mg Oral 2 times daily 07/01/21 1306 07/08/21 0959   06/30/21 0200  vancomycin (VANCOREADY) IVPB 750 mg/150 mL  Status:  Discontinued        750 mg 150 mL/hr over 60 Minutes Intravenous Every 12 hours 06/29/21 1253 06/29/21 1254   06/29/21 1700  vancomycin (VANCOREADY) IVPB 750 mg/150 mL  Status:  Discontinued        750 mg 150 mL/hr over 60 Minutes Intravenous Every 12 hours 06/29/21 1308 07/01/21 1301   06/29/21 1615  rifampin (RIFADIN) capsule 300 mg  Status:  Discontinued        300 mg Oral Daily 06/29/21 1606 06/29/21 1607   06/29/21 1130  vancomycin (VANCOREADY) IVPB 1250 mg/250 mL  Status:  Discontinued        1,250 mg 166.7 mL/hr over 90 Minutes Intravenous  Once 06/29/21 1124 06/29/21 1254   06/29/21 1130  ceFEPIme (MAXIPIME) 2 g in sodium chloride 0.9 % 100 mL IVPB  Status:  Discontinued        2 g 200 mL/hr over 30 Minutes Intravenous  Once 06/29/21 1124 06/29/21 1251       Medications: Scheduled Meds:  buprenorphine-naloxone  1.5 tablet Sublingual Daily   enoxaparin (LOVENOX) injection  40 mg Subcutaneous Q24H   famotidine  20 mg Oral Daily   feeding supplement  237 mL Oral TID BM   magic mouthwash  5 mL Oral QID   multivitamin with minerals  1 tablet Oral  Daily   nicotine  21 mg Transdermal Daily   valACYclovir  1,000 mg Oral BID   Continuous Infusions:  ceFTAROline (TEFLARO) IV 600 mg (07/07/21 0524)   DAPTOmycin (CUBICIN)  IV 500 mg (07/06/21 1503)   PRN Meds:.acetaminophen **OR** acetaminophen, albuterol, ALPRAZolam, influenza vac split quadrivalent PF, ondansetron **OR** ondansetron (ZOFRAN) IV    Objective: Weight change:   Intake/Output Summary (Last 24 hours) at 07/07/2021 1349 Last data filed at 07/07/2021 0409 Gross per 24 hour  Intake 880 ml  Output --  Net 880 ml    Blood pressure 123/88, pulse 84, temperature 97.9 F (36.6 C), temperature source Oral, resp. rate 18, height 5\' 2"  (1.575 m), weight 59 kg, SpO2 100 %. Temp:  [97.9 F (36.6 C)-99 F (37.2 C)] 97.9 F (36.6 C) (10/29 0835) Pulse Rate:  [84-95] 84 (10/29 0835) Resp:  [18-19] 18 (10/29 0835) BP: (123-129)/(77-88) 123/88 (10/29 0835) SpO2:  [98 %-100 %] 100 % (10/29 3662)  Physical Exam: Physical Exam Vitals reviewed.  Constitutional:      General: Yvette Gaines is not in  acute distress. HENT:     Head: Normocephalic and atraumatic.  Cardiovascular:     Rate and Rhythm: Tachycardia present.  Pulmonary:     Effort: Pulmonary effort is normal. No respiratory distress.     Breath sounds: No wheezing.  Abdominal:     General: There is no distension.  Musculoskeletal:        General: Normal range of motion.  Skin:    General: Skin is warm.  Neurological:     General: No focal deficit present.     Mental Status: Yvette Gaines is alert and oriented to person, place, and time.  Psychiatric:        Mood and Affect: Mood normal.        Behavior: Behavior normal.        Thought Content: Thought content normal.        Judgment: Judgment normal.    Incision and shoulder is well-healed and same for left calf CBC:    BMET No results for input(s): NA, K, CL, CO2, GLUCOSE, BUN, CREATININE, CALCIUM in the last 72 hours.    Liver Panel  No results for input(s):  PROT, ALBUMIN, AST, ALT, ALKPHOS, BILITOT, BILIDIR, IBILI in the last 72 hours.     Sedimentation Rate No results for input(s): ESRSEDRATE in the last 72 hours.  C-Reactive Protein No results for input(s): CRP in the last 72 hours.  Micro Results: Recent Results (from the past 720 hour(s))  Blood culture (routine x 2)     Status: Abnormal   Collection Time: 06/29/21  9:54 AM   Specimen: BLOOD RIGHT HAND  Result Value Ref Range Status   Specimen Description BLOOD RIGHT HAND  Final   Special Requests   Final    BOTTLES DRAWN AEROBIC ONLY Blood Culture results may not be optimal due to an inadequate volume of blood received in culture bottles   Culture  Setup Time   Final    GRAM POSITIVE COCCI AEROBIC BOTTLE ONLY CRITICAL RESULT CALLED TO, READ BACK BY AND VERIFIED WITH: G,BARR PHARMD @1338  06/30/21 EB Performed at [facility], [place] [address], [place],  [address]    Culture METHICILLIN RESISTANT STAPHYLOCOCCUS AUREUS (A)  Final   Report Status 07/02/2021 FINAL  Final   Organism ID, Bacteria METHICILLIN RESISTANT STAPHYLOCOCCUS AUREUS  Final      Susceptibility   Methicillin resistant staphylococcus aureus - MIC*    CIPROFLOXACIN <=0.5 SENSITIVE Sensitive     ERYTHROMYCIN >=8 RESISTANT Resistant     GENTAMICIN <=0.5 SENSITIVE Sensitive     OXACILLIN >=4 RESISTANT Resistant     TETRACYCLINE <=1 SENSITIVE Sensitive     VANCOMYCIN 1 SENSITIVE Sensitive     TRIMETH/SULFA <=10 SENSITIVE Sensitive     CLINDAMYCIN <=0.25 SENSITIVE Sensitive     RIFAMPIN <=0.5 SENSITIVE Sensitive     Inducible Clindamycin NEGATIVE Sensitive     * METHICILLIN RESISTANT STAPHYLOCOCCUS AUREUS  Blood Culture ID Panel (Reflexed)     Status: Abnormal   Collection Time: 06/29/21  9:54 AM  Result Value Ref Range Status   Enterococcus faecalis NOT DETECTED NOT DETECTED Final   Enterococcus Faecium NOT DETECTED NOT DETECTED Final   Listeria monocytogenes NOT DETECTED NOT DETECTED Final    Staphylococcus species DETECTED (A) NOT DETECTED Final    Comment: CRITICAL RESULT CALLED TO, READ BACK BY AND VERIFIED WITH: G,BARR PHARMD @1338  06/30/21 EB    Staphylococcus aureus (BCID) DETECTED (A) NOT DETECTED Final    Comment: Methicillin (oxacillin)-resistant Staphylococcus  aureus (MRSA). MRSA is predictably resistant to beta-lactam antibiotics (except ceftaroline). Preferred therapy is vancomycin unless clinically contraindicated. Patient requires contact precautions if  hospitalized. CRITICAL RESULT CALLED TO, READ BACK BY AND VERIFIED WITH: G,BARR PHARMD @1338  06/30/21 EB    Staphylococcus epidermidis NOT DETECTED NOT DETECTED Final   Staphylococcus lugdunensis NOT DETECTED NOT DETECTED Final   Streptococcus species NOT DETECTED NOT DETECTED Final   Streptococcus agalactiae NOT DETECTED NOT DETECTED Final   Streptococcus pneumoniae NOT DETECTED NOT DETECTED Final   Streptococcus pyogenes NOT DETECTED NOT DETECTED Final   A.calcoaceticus-baumannii NOT DETECTED NOT DETECTED Final   Bacteroides fragilis NOT DETECTED NOT DETECTED Final   Enterobacterales NOT DETECTED NOT DETECTED Final   Enterobacter cloacae complex NOT DETECTED NOT DETECTED Final   Escherichia coli NOT DETECTED NOT DETECTED Final   Klebsiella aerogenes NOT DETECTED NOT DETECTED Final   Klebsiella oxytoca NOT DETECTED NOT DETECTED Final   Klebsiella pneumoniae NOT DETECTED NOT DETECTED Final   Proteus species NOT DETECTED NOT DETECTED Final   Salmonella species NOT DETECTED NOT DETECTED Final   Serratia marcescens NOT DETECTED NOT DETECTED Final   Haemophilus influenzae NOT DETECTED NOT DETECTED Final   Neisseria meningitidis NOT DETECTED NOT DETECTED Final   Pseudomonas aeruginosa NOT DETECTED NOT DETECTED Final   Stenotrophomonas maltophilia NOT DETECTED NOT DETECTED Final   Candida albicans NOT DETECTED NOT DETECTED Final   Candida auris NOT DETECTED NOT DETECTED Final   Candida glabrata NOT DETECTED NOT  DETECTED Final   Candida krusei NOT DETECTED NOT DETECTED Final   Candida parapsilosis NOT DETECTED NOT DETECTED Final   Candida tropicalis NOT DETECTED NOT DETECTED Final   Cryptococcus neoformans/gattii NOT DETECTED NOT DETECTED Final   Meth resistant mecA/C and MREJ DETECTED (A) NOT DETECTED Final    Comment: CRITICAL RESULT CALLED TO, READ BACK BY AND VERIFIED WITH: G,BARR PHARMD @1338  06/30/21 EB Performed at [facility], [address], [place], [place] [address]   Resp Panel by RT-PCR (Flu A&B, Covid) Nasopharyngeal Swab     Status: None   Collection Time: 06/29/21  9:55 AM   Specimen: Nasopharyngeal Swab; Nasopharyngeal(NP) swabs in vial transport medium  Result Value Ref Range Status   SARS Coronavirus 2 by RT PCR NEGATIVE NEGATIVE Final    Comment: (NOTE) SARS-CoV-2 target nucleic acids are NOT DETECTED.  The SARS-CoV-2 RNA is generally detectable in upper respiratory specimens during the acute phase of infection. The lowest concentration of SARS-CoV-2 viral copies this assay can detect is 138 copies/mL. A negative result does not preclude SARS-Cov-2 infection and should not be used as the sole basis for treatment or other patient management decisions. A negative result may occur with  improper specimen collection/handling, submission of specimen other than nasopharyngeal swab, presence of viral mutation(s) within the areas targeted by this assay, and inadequate number of viral copies(<138 copies/mL). A negative result must be combined with clinical observations, patient history, and epidemiological information. The expected result is Negative.  Fact Sheet for Patients:  EntrepreneurPulse.com.au  Fact Sheet for Healthcare Providers:  IncredibleEmployment.be  This test is no t yet approved or cleared by the [place] FDA and  has been authorized for detection and/or diagnosis of SARS-CoV-2 by FDA under an Emergency Use  Authorization (EUA). This EUA will remain  in effect (meaning this test can be used) for the duration of the COVID-19 declaration under Section 564(b)(1) of the Act, 21 U.S.C.section 360bbb-3(b)(1), unless the authorization is terminated  or revoked sooner.  Influenza A by PCR NEGATIVE NEGATIVE Final   Influenza B by PCR NEGATIVE NEGATIVE Final    Comment: (NOTE) The Xpert Xpress SARS-CoV-2/FLU/RSV plus assay is intended as an aid in the diagnosis of influenza from Nasopharyngeal swab specimens and should not be used as a sole basis for treatment. Nasal washings and aspirates are unacceptable for Xpert Xpress SARS-CoV-2/FLU/RSV testing.  Fact Sheet for Patients: EntrepreneurPulse.com.au  Fact Sheet for Healthcare Providers: IncredibleEmployment.be  This test is not yet approved or cleared by the [place] FDA and has been authorized for detection and/or diagnosis of SARS-CoV-2 by FDA under an Emergency Use Authorization (EUA). This EUA will remain in effect (meaning this test can be used) for the duration of the COVID-19 declaration under Section 564(b)(1) of the Act, 21 U.S.C. section 360bbb-3(b)(1), unless the authorization is terminated or revoked.  Performed at [facility], [place] [address], [place], [place] [address]   Blood culture (routine x 2)     Status: Abnormal   Collection Time: 06/29/21  3:15 PM   Specimen: BLOOD  Result Value Ref Range Status   Specimen Description BLOOD BLOOD LEFT FOREARM  Final   Special Requests   Final    BOTTLES DRAWN AEROBIC AND ANAEROBIC Blood Culture adequate volume   Culture  Setup Time   Final    GRAM POSITIVE COCCI ANAEROBIC BOTTLE ONLY CRITICAL VALUE NOTED.  VALUE IS CONSISTENT WITH PREVIOUSLY REPORTED AND CALLED VALUE.    Culture (A)  Final    STAPHYLOCOCCUS AUREUS SUSCEPTIBILITIES PERFORMED ON PREVIOUS CULTURE WITHIN THE LAST 5 DAYS. Performed at [facility], [place] [address], [place], [place] [address]    Report Status 07/03/2021 FINAL  Final  Culture, blood (routine x 2)     Status: Abnormal   Collection Time: 06/30/21  6:50 PM   Specimen: BLOOD  Result Value Ref Range Status   Specimen Description BLOOD LEFT ANTECUBITAL  Final   Special Requests   Final    BOTTLES DRAWN AEROBIC ONLY Blood Culture results may not be optimal due to an inadequate volume of blood received in culture bottles   Culture  Setup Time   Final    GRAM POSITIVE COCCI IN CLUSTERS AEROBIC BOTTLE ONLY CRITICAL VALUE NOTED.  VALUE IS CONSISTENT WITH PREVIOUSLY REPORTED AND CALLED VALUE.    Culture (A)  Final    STAPHYLOCOCCUS AUREUS SUSCEPTIBILITIES PERFORMED ON PREVIOUS CULTURE WITHIN THE LAST 5 DAYS. Performed at [facility], [place] [address], [place],  [address]    Report Status 07/02/2021 FINAL  Final  Culture, blood (routine x 2)     Status: Abnormal   Collection Time: 06/30/21  6:55 PM   Specimen: BLOOD  Result Value Ref Range Status   Specimen Description BLOOD LEFT ANTECUBITAL  Final   Special Requests   Final    BOTTLES DRAWN AEROBIC ONLY Blood Culture results may not be optimal due to an inadequate volume of blood received in culture bottles   Culture  Setup Time   Final    GRAM POSITIVE COCCI IN CLUSTERS AEROBIC BOTTLE ONLY CRITICAL VALUE NOTED.  VALUE IS CONSISTENT WITH PREVIOUSLY REPORTED AND CALLED VALUE.    Culture (A)  Final    STAPHYLOCOCCUS AUREUS SUSCEPTIBILITIES PERFORMED ON PREVIOUS CULTURE WITHIN THE LAST 5 DAYS. Performed at [facility], [place] [address], [place],  [address]    Report Status 07/02/2021 FINAL  Final  Culture, blood (routine x 2)  Status: Abnormal (Preliminary result)   Collection Time: 07/02/21  8:50 AM   Specimen: BLOOD  Result Value Ref Range Status   Specimen Description BLOOD SITE NOT SPECIFIED  Final   Special Requests IN PEDIATRIC BOTTLE Blood Culture adequate volume  Final   Culture  Setup Time    Final    GRAM POSITIVE COCCI AEROBIC BOTTLE ONLY CRITICAL VALUE NOTED.  VALUE IS CONSISTENT WITH PREVIOUSLY REPORTED AND CALLED VALUE.    Culture (A)  Final    STAPHYLOCOCCUS AUREUS Sent to [place] for further susceptibility testing. Performed at [facility], [place] [address] [place], [place] [address]    Report Status PENDING  Incomplete  Culture, blood (routine x 2)     Status: None   Collection Time: 07/02/21 11:50 AM   Specimen: BLOOD RIGHT FOREARM  Result Value Ref Range Status   Specimen Description BLOOD RIGHT FOREARM  Final   Special Requests   Final    BOTTLES DRAWN AEROBIC AND ANAEROBIC Blood Culture adequate volume   Culture   Final    NO GROWTH 5 DAYS Performed at [facility], [place] [address], [place], [place] [address]    Report Status 07/07/2021 FINAL  Final  Culture, blood (routine x 2)     Status: Abnormal   Collection Time: 07/03/21  8:06 PM   Specimen: BLOOD  Result Value Ref Range Status   Specimen Description BLOOD RIGHT ARM  Final   Special Requests   Final    BOTTLES DRAWN AEROBIC AND ANAEROBIC Blood Culture adequate volume   Culture  Setup Time   Final    GRAM POSITIVE COCCI IN CLUSTERS ANAEROBIC BOTTLE ONLY CRITICAL VALUE NOTED.  VALUE IS CONSISTENT WITH PREVIOUSLY REPORTED AND CALLED VALUE.    Culture (A)  Final    STAPHYLOCOCCUS AUREUS SUSCEPTIBILITIES PERFORMED ON PREVIOUS CULTURE WITHIN THE LAST 5 DAYS. Performed at [facility], [place] [address] [place], [place] [address]    Report Status 07/07/2021 FINAL  Final  Culture, blood (routine x 2)     Status: None (Preliminary result)   Collection Time: 07/03/21  8:06 PM   Specimen: BLOOD  Result Value Ref Range Status   Specimen Description BLOOD LEFT ARM  Final   Special Requests   Final    BOTTLES DRAWN AEROBIC AND ANAEROBIC Blood Culture adequate volume   Culture  Setup Time   Final    GRAM POSITIVE COCCI IN CLUSTERS ANAEROBIC BOTTLE ONLY CRITICAL VALUE NOTED.  VALUE IS  CONSISTENT WITH PREVIOUSLY REPORTED AND CALLED VALUE. Performed at [facility], [place] [address], [place], [place] [address]    Culture GRAM POSITIVE COCCI  Final   Report Status PENDING  Incomplete  Culture, blood (Routine X 2) w Reflex to ID Panel     Status: Abnormal (Preliminary result)   Collection Time: 07/05/21  7:00 PM   Specimen: BLOOD RIGHT ARM  Result Value Ref Range Status   Specimen Description BLOOD RIGHT ARM  Final   Special Requests   Final    BOTTLES DRAWN AEROBIC AND ANAEROBIC Blood Culture adequate volume   Culture  Setup Time   Final    GRAM POSITIVE COCCI IN BOTH AEROBIC AND ANAEROBIC BOTTLES CRITICAL VALUE NOTED.  VALUE IS CONSISTENT WITH PREVIOUSLY REPORTED AND CALLED VALUE.    Culture (A)  Final    STAPHYLOCOCCUS AUREUS SUSCEPTIBILITIES PERFORMED ON PREVIOUS CULTURE WITHIN THE LAST 5 DAYS. Performed at [facility], [place]  [address], [place], [place] [address]    Report Status PENDING  Incomplete  Culture, blood (Routine X 2) w Reflex to ID Panel     Status: Abnormal (Preliminary result)   Collection Time: 07/05/21  7:06 PM   Specimen: BLOOD LEFT WRIST  Result Value Ref Range Status   Specimen Description BLOOD LEFT WRIST  Final   Special Requests   Final    BOTTLES DRAWN AEROBIC AND ANAEROBIC Blood Culture results may not be optimal due to an inadequate volume of blood received in culture bottles   Culture  Setup Time   Final    GRAM POSITIVE COCCI IN BOTH AEROBIC AND ANAEROBIC BOTTLES CRITICAL VALUE NOTED.  VALUE IS CONSISTENT WITH PREVIOUSLY REPORTED AND CALLED VALUE.    Culture (A)  Final    STAPHYLOCOCCUS AUREUS SUSCEPTIBILITIES PERFORMED ON PREVIOUS CULTURE WITHIN THE LAST 5 DAYS. Performed at [facility], [place] [address] [place],  [address]    Report Status PENDING  Incomplete    Studies/Results: No results found.    Assessment/Plan:  INTERVAL HISTORY:   Unfortunately Yvette Gaines is still persistently positive with her  MRSA bacteremia   Principal Problem:   MRSA bacteremia Active Problems:   Severe opioid use disorder ([place])   Endocarditis   Septic pulmonary embolism ([facility])   Chronic viral hepatitis C ([place])   Shoulder abscess   Calf abscess   Odynophagia   Protein-calorie malnutrition, severe    Yvette Gaines is a 50 y.o. 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  Cardiology and cardiothoracic surgery are following the patient and greatly appreciate their help   #2 shoulder infection cath infection: Both seem resolved  3.  Hepatitis C: Be happy to treat this in the clinic.  4.  Consideration of HIV preexposure prophylaxis we will discuss further in clinic.  IV drug use: Needs a long-term plan for coming off of her IV drugs with opiate replacement.        LOS: 8 days   Alcide Evener 07/07/2021, 1:49 PM

## 2021-07-08 DIAGNOSIS — B9562 Methicillin resistant Staphylococcus aureus infection as the cause of diseases classified elsewhere: Secondary | ICD-10-CM | POA: Diagnosis not present

## 2021-07-08 DIAGNOSIS — R7881 Bacteremia: Secondary | ICD-10-CM | POA: Diagnosis not present

## 2021-07-08 LAB — MISC LABCORP TEST (SEND OUT)
LabCorp test name: 2
Labcorp test code: 88005
Source (LabCorp): 2

## 2021-07-08 LAB — CULTURE, BLOOD (ROUTINE X 2)

## 2021-07-08 NOTE — Progress Notes (Signed)
PROGRESS NOTE    Yvette Gaines  JSE:[id number] DOB: 12/09/1970 DOA: 06/29/2021 PCP: [name], DO    Chief Complaint  Patient presents with   Chest Pain   Weakness   Shortness of Breath   Drug Problem    Brief Narrative:  Yvette Gaines is a 50 y.o. 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   Patient presented to [facility] on 10/05 for worsening of left shoulder swelling and pain.  Was found to have abscess of left shoulder and left calf,  I&D was done on 10/06 and culture showed MRSA.[name]  Patient however signed out [place] after the surgery. On 10/16, patient came back to [facility] complaining about new onset of chest pains and shortness of breath, CT chest showed multiple bilateral septic emboli, echocardiogram showed new onset of tricuspid vegetation compatible with endocarditis.  Blood culture showed again MRSA.  Patient was started on vancomycin and cefepime since. During hospital stay, patient was also found to develop acute thrombocytopenia, DIC was ruled out.  Patient was admitted to [facility], and started on IV vancomycin, TEE with tricuspid leaflet vegetation.     Assessment & Plan:   Principal Problem:   MRSA bacteremia Active Problems:   Severe opioid use disorder ([place])   Endocarditis   Septic pulmonary embolism (HCC)   Chronic viral hepatitis C (HCC)   Shoulder abscess   Calf abscess   Odynophagia   Protein-calorie malnutrition, severe   MRSA bacteremia/MRSA TV endocarditis/septic emboli/recent right shoulder and right calf abscess status post I&D 10/6 at [facility] in a patient with IV drug use. -Blood cultures positive for MRSA on 10/21, 10/22.   -Blood cultures 1/2 positive from 10/24. -Blood cultures 1/2 positive from 10/25. -Blood  cultures 2 out of 2 positive from 10/27. -Blood cultures no growth so far from 10/29. -TEE consistent with tricuspid valve endocarditis.  Seen by CT surgery.  Decided against any need for surgery.  If persistent positive cultures despite changing antibiotics, may need angio VAC. -Not a candidate to go home with PICC line, last drug use a week ago.   Was on vancomycin.  Currently remains on dual therapy with Teflaro and daptomycin.  Followed by ID.  Thrombocytopenia: Resolved.   Recent right shoulder abscess -Status post I&D on 10/06 at [facility], according to records and imaging study and orthopedic note, there was no infection on [facility] joint itself, and patient recovered well. No acute issue now.   Severe protein calorie malnutrition Nutrition Status: Nutrition Problem: Severe Malnutrition Etiology: chronic illness, cancer and cancer related treatments Signs/Symptoms: severe fat depletion, severe muscle depletion Interventions: Ensure Enlive (each supplement provides 350kcal and 20 grams of protein), Magic cup, MVI    Mild intermittent asthma -At baseline.  As needed bronchodilator.   Cigarette smoking -Nicotine patch.  IV drug abuse -She wants to quit.  Started on Suboxone 1.5 mg daily.  Currently no evidence of withdrawals.  Normocytic anemia -This is most likely anemia of acute illness/anemia of chronic disease, anemia panel with normal W73, folic acid, and ferritin level, iron level is low. -Transfuse for hemoglobin less than 7.     +HCV AB -ID recommended treatment after discharge.  DVT prophylaxis: Heparin Code Status: Full Family Communication: None at bedside Disposition:   Status is: Inpatient  Remains inpatient appropriate because: Bacteremia  with need for IV antibiotics       Consultants:  ID CT surgery   Subjective:  No new complaints.  Afebrile.  Objective: Vitals:   07/07/21 1632 07/07/21 2033 07/08/21 0556 07/08/21 0956  BP: 128/87 117/79  118/80 126/80  Pulse: 80 78 90 86  Resp: 16 17 17 18   Temp: 98.2 F (36.8 C) 98.4 F (36.9 C) 100.1 F (37.8 C) 97.9 F (36.6 C)  TempSrc: Oral Oral Oral Oral  SpO2: 100% 100% 98% 100%  Weight:      Height:        Intake/Output Summary (Last 24 hours) at 07/08/2021 1211 Last data filed at 07/08/2021 0900 Gross per 24 hour  Intake 300 ml  Output --  Net 300 ml    Filed Weights   06/29/21 1100 07/03/21 1237  Weight: 60.3 kg 59 kg    Examination:  General: Looks comfortable.  On room air. Cardiovascular: S1-S2 normal.  Regular rate rhythm. Respiratory: Bilateral clear.  No added sounds. Gastrointestinal: Soft and nontender.  Bowel sounds present. Ext: No cyanosis or swelling.  No edema. Neuro: No focal deficits.     Data Reviewed: I have personally reviewed following labs and imaging studies  CBC: Recent Labs  Lab 07/02/21 0101 07/04/21 0135  WBC 9.2 10.5  HGB 8.1* 7.8*  HCT 25.3* 24.0*  MCV 87.2 87.6  PLT 502* 548*    Basic Metabolic Panel: Recent Labs  Lab 07/02/21 0101 07/04/21 0135  NA 132* 134*  K 4.0 4.1  CL 105 107  CO2 19* 21*  GLUCOSE 98 87  BUN 14 13  CREATININE 0.59 0.62  CALCIUM 7.4* 7.7*  PHOS 4.0  --     GFR: Estimated Creatinine Clearance: 66.5 mL/min (by C-G formula based on SCr of 0.62 mg/dL).  Liver Function Tests: No results for input(s): AST, ALT, ALKPHOS, BILITOT, PROT, ALBUMIN in the last 168 hours.   CBG: Recent Labs  Lab 07/03/21 2059  GLUCAP 117*     Recent Results (from the past 240 hour(s))  Blood culture (routine x 2)     Status: Abnormal   Collection Time: 06/29/21  9:54 AM   Specimen: BLOOD RIGHT HAND  Result Value Ref Range Status   Specimen Description BLOOD RIGHT HAND  Final   Special Requests   Final    BOTTLES DRAWN AEROBIC ONLY Blood Culture results may not be optimal due to an inadequate volume of blood received in culture bottles   Culture  Setup Time   Final    GRAM POSITIVE COCCI AEROBIC  BOTTLE ONLY CRITICAL RESULT CALLED TO, READ BACK BY AND VERIFIED WITH: G,BARR PHARMD @1338  06/30/21 EB Performed at [facility], [place] [address] [place], [place] [address]    Culture METHICILLIN RESISTANT STAPHYLOCOCCUS AUREUS (A)  Final   Report Status 07/02/2021 FINAL  Final   Organism ID, Bacteria METHICILLIN RESISTANT STAPHYLOCOCCUS AUREUS  Final      Susceptibility   Methicillin resistant staphylococcus aureus - MIC*    CIPROFLOXACIN <=0.5 SENSITIVE Sensitive     ERYTHROMYCIN >=8 RESISTANT Resistant     GENTAMICIN <=0.5 SENSITIVE Sensitive     OXACILLIN >=4 RESISTANT Resistant     TETRACYCLINE <=1 SENSITIVE Sensitive     VANCOMYCIN 1 SENSITIVE Sensitive     TRIMETH/SULFA <=10 SENSITIVE Sensitive     CLINDAMYCIN <=0.25 SENSITIVE Sensitive     RIFAMPIN <=0.5 SENSITIVE Sensitive     Inducible Clindamycin NEGATIVE Sensitive     * METHICILLIN  RESISTANT STAPHYLOCOCCUS AUREUS  Blood Culture ID Panel (Reflexed)     Status: Abnormal   Collection Time: 06/29/21  9:54 AM  Result Value Ref Range Status   Enterococcus faecalis NOT DETECTED NOT DETECTED Final   Enterococcus Faecium NOT DETECTED NOT DETECTED Final   Listeria monocytogenes NOT DETECTED NOT DETECTED Final   Staphylococcus species DETECTED (A) NOT DETECTED Final    Comment: CRITICAL RESULT CALLED TO, READ BACK BY AND VERIFIED WITH: G,BARR PHARMD @1338  06/30/21 EB    Staphylococcus aureus (BCID) DETECTED (A) NOT DETECTED Final    Comment: Methicillin (oxacillin)-resistant Staphylococcus aureus (MRSA). MRSA is predictably resistant to beta-lactam antibiotics (except ceftaroline). Preferred therapy is vancomycin unless clinically contraindicated. Patient requires contact precautions if  hospitalized. CRITICAL RESULT CALLED TO, READ BACK BY AND VERIFIED WITH: G,BARR PHARMD @1338  06/30/21 EB    Staphylococcus epidermidis NOT DETECTED NOT DETECTED Final   Staphylococcus lugdunensis NOT DETECTED NOT DETECTED Final    Streptococcus species NOT DETECTED NOT DETECTED Final   Streptococcus agalactiae NOT DETECTED NOT DETECTED Final   Streptococcus pneumoniae NOT DETECTED NOT DETECTED Final   Streptococcus pyogenes NOT DETECTED NOT DETECTED Final   A.calcoaceticus-baumannii NOT DETECTED NOT DETECTED Final   Bacteroides fragilis NOT DETECTED NOT DETECTED Final   Enterobacterales NOT DETECTED NOT DETECTED Final   Enterobacter cloacae complex NOT DETECTED NOT DETECTED Final   Escherichia coli NOT DETECTED NOT DETECTED Final   Klebsiella aerogenes NOT DETECTED NOT DETECTED Final   Klebsiella oxytoca NOT DETECTED NOT DETECTED Final   Klebsiella pneumoniae NOT DETECTED NOT DETECTED Final   Proteus species NOT DETECTED NOT DETECTED Final   Salmonella species NOT DETECTED NOT DETECTED Final   Serratia marcescens NOT DETECTED NOT DETECTED Final   Haemophilus influenzae NOT DETECTED NOT DETECTED Final   Neisseria meningitidis NOT DETECTED NOT DETECTED Final   Pseudomonas aeruginosa NOT DETECTED NOT DETECTED Final   Stenotrophomonas maltophilia NOT DETECTED NOT DETECTED Final   Candida albicans NOT DETECTED NOT DETECTED Final   Candida auris NOT DETECTED NOT DETECTED Final   Candida glabrata NOT DETECTED NOT DETECTED Final   Candida krusei NOT DETECTED NOT DETECTED Final   Candida parapsilosis NOT DETECTED NOT DETECTED Final   Candida tropicalis NOT DETECTED NOT DETECTED Final   Cryptococcus neoformans/gattii NOT DETECTED NOT DETECTED Final   Meth resistant mecA/C and MREJ DETECTED (A) NOT DETECTED Final    Comment: CRITICAL RESULT CALLED TO, READ BACK BY AND VERIFIED WITH: G,BARR PHARMD @1338  06/30/21 EB Performed at [facility], [address] [place],  [address]   Resp Panel by RT-PCR (Flu A&B, Covid) Nasopharyngeal Swab     Status: None   Collection Time: 06/29/21  9:55 AM   Specimen: Nasopharyngeal Swab; Nasopharyngeal(NP) swabs in vial transport medium  Result Value Ref Range Status   SARS  Coronavirus 2 by RT PCR NEGATIVE NEGATIVE Final    Comment: (NOTE) SARS-CoV-2 target nucleic acids are NOT DETECTED.  The SARS-CoV-2 RNA is generally detectable in upper respiratory specimens during the acute phase of infection. The lowest concentration of SARS-CoV-2 viral copies this assay can detect is 138 copies/mL. A negative result does not preclude SARS-Cov-2 infection and should not be used as the sole basis for treatment or other patient management decisions. A negative result may occur with  improper specimen collection/handling, submission of specimen other than nasopharyngeal swab, presence of viral mutation(s) within the areas targeted by this assay, and inadequate number of viral copies(<138 copies/mL). A negative result must be  combined with clinical observations, patient history, and epidemiological information. The expected result is Negative.  Fact Sheet for Patients:  EntrepreneurPulse.com.au  Fact Sheet for Healthcare Providers:  IncredibleEmployment.be  This test is no t yet approved or cleared by the [place] FDA and  has been authorized for detection and/or diagnosis of SARS-CoV-2 by FDA under an Emergency Use Authorization (EUA). This EUA will remain  in effect (meaning this test can be used) for the duration of the COVID-19 declaration under Section 564(b)(1) of the Act, 21 U.S.C.section 360bbb-3(b)(1), unless the authorization is terminated  or revoked sooner.       Influenza A by PCR NEGATIVE NEGATIVE Final   Influenza B by PCR NEGATIVE NEGATIVE Final    Comment: (NOTE) The Xpert Xpress SARS-CoV-2/FLU/RSV plus assay is intended as an aid in the diagnosis of influenza from Nasopharyngeal swab specimens and should not be used as a sole basis for treatment. Nasal washings and aspirates are unacceptable for Xpert Xpress SARS-CoV-2/FLU/RSV testing.  Fact Sheet for  Patients: EntrepreneurPulse.com.au  Fact Sheet for Healthcare Providers: IncredibleEmployment.be  This test is not yet approved or cleared by the [place] FDA and has been authorized for detection and/or diagnosis of SARS-CoV-2 by FDA under an Emergency Use Authorization (EUA). This EUA will remain in effect (meaning this test can be used) for the duration of the COVID-19 declaration under Section 564(b)(1) of the Act, 21 U.S.C. section 360bbb-3(b)(1), unless the authorization is terminated or revoked.  Performed at [facility], [place] [address], [place], [place] [address]   Blood culture (routine x 2)     Status: Abnormal   Collection Time: 06/29/21  3:15 PM   Specimen: BLOOD  Result Value Ref Range Status   Specimen Description BLOOD BLOOD LEFT FOREARM  Final   Special Requests   Final    BOTTLES DRAWN AEROBIC AND ANAEROBIC Blood Culture adequate volume   Culture  Setup Time   Final    GRAM POSITIVE COCCI ANAEROBIC BOTTLE ONLY CRITICAL VALUE NOTED.  VALUE IS CONSISTENT WITH PREVIOUSLY REPORTED AND CALLED VALUE.    Culture (A)  Final    STAPHYLOCOCCUS AUREUS SUSCEPTIBILITIES PERFORMED ON PREVIOUS CULTURE WITHIN THE LAST 5 DAYS. Performed at [facility], [place] [address] [place], [place] [address]    Report Status 07/03/2021 FINAL  Final  Culture, blood (routine x 2)     Status: Abnormal   Collection Time: 06/30/21  6:50 PM   Specimen: BLOOD  Result Value Ref Range Status   Specimen Description BLOOD LEFT ANTECUBITAL  Final   Special Requests   Final    BOTTLES DRAWN AEROBIC ONLY Blood Culture results may not be optimal due to an inadequate volume of blood received in culture bottles   Culture  Setup Time   Final    GRAM POSITIVE COCCI IN CLUSTERS AEROBIC BOTTLE ONLY CRITICAL VALUE NOTED.  VALUE IS CONSISTENT WITH PREVIOUSLY REPORTED AND CALLED VALUE.    Culture (A)  Final    STAPHYLOCOCCUS AUREUS SUSCEPTIBILITIES  PERFORMED ON PREVIOUS CULTURE WITHIN THE LAST 5 DAYS. Performed at [facility], [place] [address], [place], [place] [address]    Report Status 07/02/2021 FINAL  Final  Culture, blood (routine x 2)     Status: Abnormal   Collection Time: 06/30/21  6:55 PM   Specimen: BLOOD  Result Value Ref Range Status   Specimen Description BLOOD LEFT ANTECUBITAL  Final   Special Requests   Final    BOTTLES  DRAWN AEROBIC ONLY Blood Culture results may not be optimal due to an inadequate volume of blood received in culture bottles   Culture  Setup Time   Final    GRAM POSITIVE COCCI IN CLUSTERS AEROBIC BOTTLE ONLY CRITICAL VALUE NOTED.  VALUE IS CONSISTENT WITH PREVIOUSLY REPORTED AND CALLED VALUE.    Culture (A)  Final    STAPHYLOCOCCUS AUREUS SUSCEPTIBILITIES PERFORMED ON PREVIOUS CULTURE WITHIN THE LAST 5 DAYS. Performed at [facility], [place] [address] [place], [place] [address]    Report Status 07/02/2021 FINAL  Final  Culture, blood (routine x 2)     Status: Abnormal (Preliminary result)   Collection Time: 07/02/21  8:50 AM   Specimen: BLOOD  Result Value Ref Range Status   Specimen Description BLOOD SITE NOT SPECIFIED  Final   Special Requests IN PEDIATRIC BOTTLE Blood Culture adequate volume  Final   Culture  Setup Time   Final    GRAM POSITIVE COCCI AEROBIC BOTTLE ONLY CRITICAL VALUE NOTED.  VALUE IS CONSISTENT WITH PREVIOUSLY REPORTED AND CALLED VALUE.    Culture (A)  Final    STAPHYLOCOCCUS AUREUS Sent to [place] for further susceptibility testing. Performed at [facility], [place] [address] [place], [place] [address]    Report Status PENDING  Incomplete  Culture, blood (routine x 2)     Status: None   Collection Time: 07/02/21 11:50 AM   Specimen: BLOOD RIGHT FOREARM  Result Value Ref Range Status   Specimen Description BLOOD RIGHT FOREARM  Final   Special Requests   Final    BOTTLES DRAWN AEROBIC AND ANAEROBIC Blood Culture adequate volume   Culture   Final     NO GROWTH 5 DAYS Performed at [facility], [place] [address] [place], [place] [address]    Report Status 07/07/2021 FINAL  Final  Culture, blood (routine x 2)     Status: Abnormal   Collection Time: 07/03/21  8:06 PM   Specimen: BLOOD  Result Value Ref Range Status   Specimen Description BLOOD RIGHT ARM  Final   Special Requests   Final    BOTTLES DRAWN AEROBIC AND ANAEROBIC Blood Culture adequate volume   Culture  Setup Time   Final    GRAM POSITIVE COCCI IN CLUSTERS ANAEROBIC BOTTLE ONLY CRITICAL VALUE NOTED.  VALUE IS CONSISTENT WITH PREVIOUSLY REPORTED AND CALLED VALUE.    Culture (A)  Final    STAPHYLOCOCCUS AUREUS SUSCEPTIBILITIES PERFORMED ON PREVIOUS CULTURE WITHIN THE LAST 5 DAYS. Performed at [facility], [place] [address] [place], [place] [address]    Report Status 07/07/2021 FINAL  Final  Culture, blood (routine x 2)     Status: Abnormal (Preliminary result)   Collection Time: 07/03/21  8:06 PM   Specimen: BLOOD  Result Value Ref Range Status   Specimen Description BLOOD LEFT ARM  Final   Special Requests   Final    BOTTLES DRAWN AEROBIC AND ANAEROBIC Blood Culture adequate volume   Culture  Setup Time   Final    GRAM POSITIVE COCCI IN CLUSTERS ANAEROBIC BOTTLE ONLY CRITICAL VALUE NOTED.  VALUE IS CONSISTENT WITH PREVIOUSLY REPORTED AND CALLED VALUE.    Culture (A)  Final    STAPHYLOCOCCUS AUREUS SUSCEPTIBILITIES TO FOLLOW Performed at [facility], [place] [address] [place], [place] [address]    Report Status PENDING  Incomplete  Culture, blood (Routine X 2) w Reflex to ID Panel     Status: Abnormal (  Preliminary result)   Collection Time: 07/05/21  7:00 PM   Specimen: BLOOD RIGHT ARM  Result Value Ref Range Status   Specimen Description BLOOD RIGHT ARM  Final   Special Requests   Final    BOTTLES DRAWN AEROBIC AND ANAEROBIC Blood Culture adequate volume   Culture  Setup Time   Final    GRAM POSITIVE COCCI IN BOTH AEROBIC AND ANAEROBIC  BOTTLES CRITICAL VALUE NOTED.  VALUE IS CONSISTENT WITH PREVIOUSLY REPORTED AND CALLED VALUE.    Culture (A)  Final    STAPHYLOCOCCUS AUREUS SUSCEPTIBILITIES PERFORMED ON PREVIOUS CULTURE WITHIN THE LAST 5 DAYS. Performed at [facility], [place] [address], [place], [place] [address]    Report Status PENDING  Incomplete  Culture, blood (Routine X 2) w Reflex to ID Panel     Status: Abnormal   Collection Time: 07/05/21  7:06 PM   Specimen: BLOOD LEFT WRIST  Result Value Ref Range Status   Specimen Description BLOOD LEFT WRIST  Final   Special Requests   Final    BOTTLES DRAWN AEROBIC AND ANAEROBIC Blood Culture results may not be optimal due to an inadequate volume of blood received in culture bottles   Culture  Setup Time   Final    GRAM POSITIVE COCCI IN BOTH AEROBIC AND ANAEROBIC BOTTLES CRITICAL VALUE NOTED.  VALUE IS CONSISTENT WITH PREVIOUSLY REPORTED AND CALLED VALUE.    Culture (A)  Final    STAPHYLOCOCCUS AUREUS SUSCEPTIBILITIES PERFORMED ON PREVIOUS CULTURE WITHIN THE LAST 5 DAYS. Performed at [facility], [place] [address], [place], [place] [address]    Report Status 07/08/2021 FINAL  Final  Culture, blood (Routine X 2) w Reflex to ID Panel     Status: None (Preliminary result)   Collection Time: 07/07/21  2:38 PM   Specimen: BLOOD RIGHT ARM  Result Value Ref Range Status   Specimen Description BLOOD RIGHT ARM  Final   Special Requests   Final    BOTTLES DRAWN AEROBIC ONLY Blood Culture results may not be optimal due to an inadequate volume of blood received in culture bottles   Culture   Final    NO GROWTH < 24 HOURS Performed at [facility], [place] [address] [place], [place] [address]    Report Status PENDING  Incomplete  Culture, blood (Routine X 2) w Reflex to ID Panel     Status: None (Preliminary result)   Collection Time: 07/07/21  2:39 PM   Specimen: BLOOD RIGHT HAND  Result Value Ref Range Status   Specimen Description BLOOD RIGHT HAND  Final    Special Requests   Final    BOTTLES DRAWN AEROBIC ONLY Blood Culture results may not be optimal due to an inadequate volume of blood received in culture bottles   Culture   Final    NO GROWTH < 24 HOURS Performed at [facility], [place] [address], [place], [place] [address]    Report Status PENDING  Incomplete         Radiology Studies: No results found.      Scheduled Meds:  buprenorphine-naloxone  1.5 tablet Sublingual Daily   enoxaparin (LOVENOX) injection  40 mg Subcutaneous Q24H   famotidine  20 mg Oral Daily   feeding supplement  237 mL Oral TID BM   magic mouthwash  5 mL Oral QID   multivitamin with minerals  1 tablet Oral Daily   nicotine  21 mg Transdermal Daily  Continuous Infusions:  ceFTAROline (TEFLARO) IV 600 mg (07/08/21 0557)   DAPTOmycin (CUBICIN)  IV 500 mg (07/07/21 2223)     LOS: 9 days   Total time spent: 25 minutes.    Barb Merino, MD [facility]  07/08/2021, 12:11 PM

## 2021-07-09 ENCOUNTER — Other Ambulatory Visit: Payer: Self-pay

## 2021-07-09 DIAGNOSIS — R7881 Bacteremia: Secondary | ICD-10-CM | POA: Diagnosis not present

## 2021-07-09 DIAGNOSIS — B9562 Methicillin resistant Staphylococcus aureus infection as the cause of diseases classified elsewhere: Secondary | ICD-10-CM | POA: Diagnosis not present

## 2021-07-09 LAB — CULTURE, BLOOD (ROUTINE X 2)
Special Requests: ADEQUATE
Special Requests: ADEQUATE

## 2021-07-09 NOTE — Progress Notes (Addendum)
[facility] PROGRESS [facility]  Yvette Gaines EUM:[id number] DOB: 1971-02-09 DOA: 06/29/2021 PCP: [name], DO  Status: Remains inpatient appropriate because:  Unsafe to discharge patient with history of IVDU with PICC line in place for outpatient IV therapy Patient requires a total of 6 weeks of IV antibiotics after first negative blood culture result  Barriers to discharge: Social: IVDU history so unable to discharge to complete antibiotic therapies in the outpatient environment  Clinical: Follow-up blood cultures remain positive for Staphylococcus with repeat blood cultures pending as of 10/29 Also initiated on Suboxone this admission to treat substance abuse and will need to have follow-up appointment with Suboxone clinic prior to prescribing Suboxone at discharge.  Suboxone will need to be prescribed by any attending physician that has privileges to prescribe this medication  Level of care:  Med-Surg   Code Status: Full Family Communication: Patient DVT prophylaxis: Lovenox COVID vaccination status: Unknown  HPI: 50 y.o. 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   Patient presented to [facility] on 10/05 for worsening of left shoulder swelling and pain.  Was found to have abscess of left shoulder and left calf,  I&D was done on 10/06 and culture showed MRSA.[name]  Patient however signed out Botines after the surgery. On 10/16, patient came back to [facility] complaining about new onset of chest pains and shortness of breath, CT chest showed multiple bilateral septic emboli, echocardiogram showed new onset of tricuspid vegetation compatible with endocarditis.  Blood culture showed again MRSA.  Patient was started on vancomycin and cefepime since. During hospital  stay, patient was also found to develop acute thrombocytopenia, DIC was ruled out.  Patient was admitted to [facility], and started on IV vancomycin, TEE with tricuspid leaflet vegetation.   Subjective: Patient complaining of vivid dreams which she attributes to transitioning off of drugs to Suboxone.  At present time she states insomnia not significant enough to require sleep aid/sedative medication.  Objective: Vitals:   07/09/21 0442 07/09/21 0744  BP: 115/84 111/83  Pulse: 84 80  Resp: 16 17  Temp: 98.9 F (37.2 C) 98.5 F (36.9 C)  SpO2: 99% 98%    Intake/Output Summary (Last 24 hours) at 07/09/2021 0911 Last data filed at 07/09/2021 0600 Gross per 24 hour  Intake 1220 ml  Output --  Net 1220 ml   Filed Weights   06/29/21 1100 07/03/21 1237  Weight: 60.3 kg 59 kg    Exam:  Constitutional: NAD, calm, comfortable-appears somewhat malnourished and underweight Respiratory: clear to auscultation bilaterally, no wheezing, no crackles. Normal respiratory effort. No accessory muscle use.  Room air Cardiovascular: Regular rate and rhythm, no murmurs / rubs / gallops. No extremity edema. 2+ pedal pulses. Abdomen: no tenderness, no masses palpated. Bowel sounds positive. LBM  Neurologic: CN 2-12 grossly intact. Sensation intact, DTR normal. Strength 5/5 x all 4 extremities.  Psychiatric: Normal judgment and insight. Alert and oriented x 3. Normal mood.    Assessment/Plan: Acute problems:   MRSA bacteremia Active Problems:   Severe opioid use disorder (HCC)   Endocarditis   Septic pulmonary embolism (HCC)   Chronic viral hepatitis C (HCC)   Shoulder abscess   Calf abscess   Odynophagia   Protein-calorie malnutrition, severe     MRSA bacteremia/MRSA TV endocarditis/septic emboli/recent right shoulder and right calf  abscess /status post I&D 10/6 (at [facility])  -TEE consistent with tricuspid valve endocarditis.  Seen by CT surgery.  Decided against any need for  surgery.  If persistent positive cultures despite changing antibiotics, may need angio VAC. -Not a candidate to go home with PICC line, last drug use a week ago.   -Vancomycin discontinued, narrowed to Teflaro and daptomycin by ID. -Last blood cultures obtained on 10/29 and currently remain negative  IVDU with narcotics -Started on Suboxone this admission which will need to be continued after discharge -Once date of last dose IV antibiotics determined will need to have current Suboxone clinic reach out to patient regarding assessment so follow-up appointment can be scheduled -Once ready for discharge I will ask Dr. Loleta Books to prescribe Suboxone -Unable to place PICC line until blood cultures are negative and after with ID   Thrombocytopenia:  Resolved.   Recent right shoulder and calf abscess -Source of bacteremia -Status post I&D on 10/06 at [facility] -At outside hospital revealed no infectious involvement of AC joint    Severe protein calorie malnutrition Nutrition Problem: Severe Malnutrition Etiology: chronic illness and poor intake prior to admission secondary to IVDU Body mass index is 23.78 kg/m.  Signs/Symptoms: severe fat depletion, severe muscle depletion Interventions: Ensure Enlive (each supplement provides 350kcal and 20 grams of protein), Magic cup, MVI    Mild intermittent asthma/current tobacco abuse -Currently asymptomatic -Continue prn bronchodilators -Continue nicotine patch   Normocytic anemia -Anemia panel reveals low iron which is consistent with chronic anemia for nutrition -Current hemoglobin 7.8 -Transfuse for hemoglobin less than 7.      +HCV AB -ID recommended treatment after discharge.   Blood culture data: -Blood cultures positive for MRSA on 10/21, 10/22.   -Blood cultures 1/2 positive from 10/24. -Blood cultures 1/2 positive from 10/25. -Blood cultures 2 out of 2 positive from 10/27. -Blood cultures no growth so far from 10/29.   Data  Reviewed: Basic Metabolic Panel: Recent Labs  Lab 07/04/21 0135  NA 134*  K 4.1  CL 107  CO2 21*  GLUCOSE 87  BUN 13  CREATININE 0.62  CALCIUM 7.7*   CBC: Recent Labs  Lab 07/04/21 0135  WBC 10.5  HGB 7.8*  HCT 24.0*  MCV 87.6  PLT 548*   Cardiac Enzymes: Recent Labs  Lab 07/06/21 1318  CKTOTAL 16*   BNP (last 3 results) Recent Labs    06/29/21 1442  BNP 113.8*    ProBNP (last 3 results) No results for input(s): PROBNP in the last 8760 hours.  CBG: Recent Labs  Lab 07/03/21 2059  GLUCAP 117*    Recent Results (from the past 240 hour(s))  Blood culture (routine x 2)     Status: Abnormal   Collection Time: 06/29/21  9:54 AM   Specimen: BLOOD RIGHT HAND  Result Value Ref Range Status   Specimen Description BLOOD RIGHT HAND  Final   Special Requests   Final    BOTTLES DRAWN AEROBIC ONLY Blood Culture results may not be optimal due to an inadequate volume of blood received in culture bottles   Culture  Setup Time   Final    GRAM POSITIVE COCCI AEROBIC BOTTLE ONLY CRITICAL RESULT CALLED TO, READ BACK BY AND VERIFIED WITH: G,BARR PHARMD @1338  06/30/21 EB Performed at [facility], [place] [address] [place], [place] [address]    Culture METHICILLIN RESISTANT STAPHYLOCOCCUS AUREUS (A)  Final   Report Status 07/02/2021 FINAL  Final   Organism ID, Bacteria  METHICILLIN RESISTANT STAPHYLOCOCCUS AUREUS  Final      Susceptibility   Methicillin resistant staphylococcus aureus - MIC*    CIPROFLOXACIN <=0.5 SENSITIVE Sensitive     ERYTHROMYCIN >=8 RESISTANT Resistant     GENTAMICIN <=0.5 SENSITIVE Sensitive     OXACILLIN >=4 RESISTANT Resistant     TETRACYCLINE <=1 SENSITIVE Sensitive     VANCOMYCIN 1 SENSITIVE Sensitive     TRIMETH/SULFA <=10 SENSITIVE Sensitive     CLINDAMYCIN <=0.25 SENSITIVE Sensitive     RIFAMPIN <=0.5 SENSITIVE Sensitive     Inducible Clindamycin NEGATIVE Sensitive     * METHICILLIN RESISTANT STAPHYLOCOCCUS AUREUS  Blood Culture  ID Panel (Reflexed)     Status: Abnormal   Collection Time: 06/29/21  9:54 AM  Result Value Ref Range Status   Enterococcus faecalis NOT DETECTED NOT DETECTED Final   Enterococcus Faecium NOT DETECTED NOT DETECTED Final   Listeria monocytogenes NOT DETECTED NOT DETECTED Final   Staphylococcus species DETECTED (A) NOT DETECTED Final    Comment: CRITICAL RESULT CALLED TO, READ BACK BY AND VERIFIED WITH: G,BARR PHARMD @1338  06/30/21 EB    Staphylococcus aureus (BCID) DETECTED (A) NOT DETECTED Final    Comment: Methicillin (oxacillin)-resistant Staphylococcus aureus (MRSA). MRSA is predictably resistant to beta-lactam antibiotics (except ceftaroline). Preferred therapy is vancomycin unless clinically contraindicated. Patient requires contact precautions if  hospitalized. CRITICAL RESULT CALLED TO, READ BACK BY AND VERIFIED WITH: G,BARR PHARMD @1338  06/30/21 EB    Staphylococcus epidermidis NOT DETECTED NOT DETECTED Final   Staphylococcus lugdunensis NOT DETECTED NOT DETECTED Final   Streptococcus species NOT DETECTED NOT DETECTED Final   Streptococcus agalactiae NOT DETECTED NOT DETECTED Final   Streptococcus pneumoniae NOT DETECTED NOT DETECTED Final   Streptococcus pyogenes NOT DETECTED NOT DETECTED Final   A.calcoaceticus-baumannii NOT DETECTED NOT DETECTED Final   Bacteroides fragilis NOT DETECTED NOT DETECTED Final   Enterobacterales NOT DETECTED NOT DETECTED Final   Enterobacter cloacae complex NOT DETECTED NOT DETECTED Final   Escherichia coli NOT DETECTED NOT DETECTED Final   Klebsiella aerogenes NOT DETECTED NOT DETECTED Final   Klebsiella oxytoca NOT DETECTED NOT DETECTED Final   Klebsiella pneumoniae NOT DETECTED NOT DETECTED Final   Proteus species NOT DETECTED NOT DETECTED Final   Salmonella species NOT DETECTED NOT DETECTED Final   Serratia marcescens NOT DETECTED NOT DETECTED Final   Haemophilus influenzae NOT DETECTED NOT DETECTED Final   Neisseria meningitidis NOT  DETECTED NOT DETECTED Final   Pseudomonas aeruginosa NOT DETECTED NOT DETECTED Final   Stenotrophomonas maltophilia NOT DETECTED NOT DETECTED Final   Candida albicans NOT DETECTED NOT DETECTED Final   Candida auris NOT DETECTED NOT DETECTED Final   Candida glabrata NOT DETECTED NOT DETECTED Final   Candida krusei NOT DETECTED NOT DETECTED Final   Candida parapsilosis NOT DETECTED NOT DETECTED Final   Candida tropicalis NOT DETECTED NOT DETECTED Final   Cryptococcus neoformans/gattii NOT DETECTED NOT DETECTED Final   Meth resistant mecA/C and MREJ DETECTED (A) NOT DETECTED Final    Comment: CRITICAL RESULT CALLED TO, READ BACK BY AND VERIFIED WITH: G,BARR PHARMD @1338  06/30/21 EB Performed at [facility], [address] [place], [place] [address]   Resp Panel by RT-PCR (Flu A&B, Covid) Nasopharyngeal Swab     Status: None   Collection Time: 06/29/21  9:55 AM   Specimen: Nasopharyngeal Swab; Nasopharyngeal(NP) swabs in vial transport medium  Result Value Ref Range Status   SARS Coronavirus 2 by RT PCR NEGATIVE NEGATIVE Final  Comment: (NOTE) SARS-CoV-2 target nucleic acids are NOT DETECTED.  The SARS-CoV-2 RNA is generally detectable in upper respiratory specimens during the acute phase of infection. The lowest concentration of SARS-CoV-2 viral copies this assay can detect is 138 copies/mL. A negative result does not preclude SARS-Cov-2 infection and should not be used as the sole basis for treatment or other patient management decisions. A negative result may occur with  improper specimen collection/handling, submission of specimen other than nasopharyngeal swab, presence of viral mutation(s) within the areas targeted by this assay, and inadequate number of viral copies(<138 copies/mL). A negative result must be combined with clinical observations, patient history, and epidemiological information. The expected result is Negative.  Fact Sheet for Patients:   EntrepreneurPulse.com.au  Fact Sheet for Healthcare Providers:  IncredibleEmployment.be  This test is no t yet approved or cleared by the [place] FDA and  has been authorized for detection and/or diagnosis of SARS-CoV-2 by FDA under an Emergency Use Authorization (EUA). This EUA will remain  in effect (meaning this test can be used) for the duration of the COVID-19 declaration under Section 564(b)(1) of the Act, 21 U.S.C.section 360bbb-3(b)(1), unless the authorization is terminated  or revoked sooner.       Influenza A by PCR NEGATIVE NEGATIVE Final   Influenza B by PCR NEGATIVE NEGATIVE Final    Comment: (NOTE) The Xpert Xpress SARS-CoV-2/FLU/RSV plus assay is intended as an aid in the diagnosis of influenza from Nasopharyngeal swab specimens and should not be used as a sole basis for treatment. Nasal washings and aspirates are unacceptable for Xpert Xpress SARS-CoV-2/FLU/RSV testing.  Fact Sheet for Patients: EntrepreneurPulse.com.au  Fact Sheet for Healthcare Providers: IncredibleEmployment.be  This test is not yet approved or cleared by the [place] FDA and has been authorized for detection and/or diagnosis of SARS-CoV-2 by FDA under an Emergency Use Authorization (EUA). This EUA will remain in effect (meaning this test can be used) for the duration of the COVID-19 declaration under Section 564(b)(1) of the Act, 21 U.S.C. section 360bbb-3(b)(1), unless the authorization is terminated or revoked.  Performed at [facility], [place] [address] [place], [place] [address]   Blood culture (routine x 2)     Status: Abnormal   Collection Time: 06/29/21  3:15 PM   Specimen: BLOOD  Result Value Ref Range Status   Specimen Description BLOOD BLOOD LEFT FOREARM  Final   Special Requests   Final    BOTTLES DRAWN AEROBIC AND ANAEROBIC Blood Culture adequate volume   Culture  Setup Time   Final     GRAM POSITIVE COCCI ANAEROBIC BOTTLE ONLY CRITICAL VALUE NOTED.  VALUE IS CONSISTENT WITH PREVIOUSLY REPORTED AND CALLED VALUE.    Culture (A)  Final    STAPHYLOCOCCUS AUREUS SUSCEPTIBILITIES PERFORMED ON PREVIOUS CULTURE WITHIN THE LAST 5 DAYS. Performed at [facility], [place] [address], [place], [place] [address]    Report Status 07/03/2021 FINAL  Final  Culture, blood (routine x 2)     Status: Abnormal   Collection Time: 06/30/21  6:50 PM   Specimen: BLOOD  Result Value Ref Range Status   Specimen Description BLOOD LEFT ANTECUBITAL  Final   Special Requests   Final    BOTTLES DRAWN AEROBIC ONLY Blood Culture results may not be optimal due to an inadequate volume of blood received in culture bottles   Culture  Setup Time   Final    GRAM POSITIVE COCCI IN CLUSTERS AEROBIC BOTTLE ONLY CRITICAL VALUE NOTED.  VALUE IS CONSISTENT WITH PREVIOUSLY REPORTED AND CALLED VALUE.    Culture (A)  Final    STAPHYLOCOCCUS AUREUS SUSCEPTIBILITIES PERFORMED ON PREVIOUS CULTURE WITHIN THE LAST 5 DAYS. Performed at [facility], [place] [address] [place], [place] [address]    Report Status 07/02/2021 FINAL  Final  Culture, blood (routine x 2)     Status: Abnormal   Collection Time: 06/30/21  6:55 PM   Specimen: BLOOD  Result Value Ref Range Status   Specimen Description BLOOD LEFT ANTECUBITAL  Final   Special Requests   Final    BOTTLES DRAWN AEROBIC ONLY Blood Culture results may not be optimal due to an inadequate volume of blood received in culture bottles   Culture  Setup Time   Final    GRAM POSITIVE COCCI IN CLUSTERS AEROBIC BOTTLE ONLY CRITICAL VALUE NOTED.  VALUE IS CONSISTENT WITH PREVIOUSLY REPORTED AND CALLED VALUE.    Culture (A)  Final    STAPHYLOCOCCUS AUREUS SUSCEPTIBILITIES PERFORMED ON PREVIOUS CULTURE WITHIN THE LAST 5 DAYS. Performed at [facility], [place] [address], [place], [place] [address]    Report Status 07/02/2021 FINAL  Final  Culture, blood  (routine x 2)     Status: Abnormal (Preliminary result)   Collection Time: 07/02/21  8:50 AM   Specimen: BLOOD  Result Value Ref Range Status   Specimen Description BLOOD SITE NOT SPECIFIED  Final   Special Requests IN PEDIATRIC BOTTLE Blood Culture adequate volume  Final   Culture  Setup Time   Final    GRAM POSITIVE COCCI AEROBIC BOTTLE ONLY CRITICAL VALUE NOTED.  VALUE IS CONSISTENT WITH PREVIOUSLY REPORTED AND CALLED VALUE.    Culture (A)  Final    STAPHYLOCOCCUS AUREUS Sent to [place] for further susceptibility testing. Performed at [facility], [place] [address], [place], [place] [address]    Report Status PENDING  Incomplete  Culture, blood (routine x 2)     Status: None   Collection Time: 07/02/21 11:50 AM   Specimen: BLOOD RIGHT FOREARM  Result Value Ref Range Status   Specimen Description BLOOD RIGHT FOREARM  Final   Special Requests   Final    BOTTLES DRAWN AEROBIC AND ANAEROBIC Blood Culture adequate volume   Culture   Final    NO GROWTH 5 DAYS Performed at [facility], [place] [address], [place], [place] [address]    Report Status 07/07/2021 FINAL  Final  Culture, blood (routine x 2)     Status: Abnormal   Collection Time: 07/03/21  8:06 PM   Specimen: BLOOD  Result Value Ref Range Status   Specimen Description BLOOD RIGHT ARM  Final   Special Requests   Final    BOTTLES DRAWN AEROBIC AND ANAEROBIC Blood Culture adequate volume   Culture  Setup Time   Final    GRAM POSITIVE COCCI IN CLUSTERS ANAEROBIC BOTTLE ONLY CRITICAL VALUE NOTED.  VALUE IS CONSISTENT WITH PREVIOUSLY REPORTED AND CALLED VALUE.    Culture (A)  Final    STAPHYLOCOCCUS AUREUS SUSCEPTIBILITIES PERFORMED ON PREVIOUS CULTURE WITHIN THE LAST 5 DAYS. Performed at [facility], [place] [address] [place], [place] [address]    Report Status 07/07/2021 FINAL  Final  Culture, blood (routine x 2)     Status: Abnormal   Collection Time: 07/03/21  8:06 PM   Specimen: BLOOD  Result Value  Ref Range Status   Specimen Description BLOOD LEFT ARM  Final   Special  Requests   Final    BOTTLES DRAWN AEROBIC AND ANAEROBIC Blood Culture adequate volume   Culture  Setup Time   Final    GRAM POSITIVE COCCI IN CLUSTERS ANAEROBIC BOTTLE ONLY CRITICAL VALUE NOTED.  VALUE IS CONSISTENT WITH PREVIOUSLY REPORTED AND CALLED VALUE. Performed at [facility], [place] [address], [place], [place] [address]    Culture METHICILLIN RESISTANT STAPHYLOCOCCUS AUREUS (A)  Final   Report Status 07/09/2021 FINAL  Final   Organism ID, Bacteria METHICILLIN RESISTANT STAPHYLOCOCCUS AUREUS  Final      Susceptibility   Methicillin resistant staphylococcus aureus - MIC*    CIPROFLOXACIN <=0.5 SENSITIVE Sensitive     ERYTHROMYCIN >=8 RESISTANT Resistant     GENTAMICIN <=0.5 SENSITIVE Sensitive     OXACILLIN >=4 RESISTANT Resistant     TETRACYCLINE <=1 SENSITIVE Sensitive     VANCOMYCIN 1 SENSITIVE Sensitive     TRIMETH/SULFA <=10 SENSITIVE Sensitive     CLINDAMYCIN <=0.25 SENSITIVE Sensitive     RIFAMPIN <=0.5 SENSITIVE Sensitive     Inducible Clindamycin NEGATIVE Sensitive     * METHICILLIN RESISTANT STAPHYLOCOCCUS AUREUS  Culture, blood (Routine X 2) w Reflex to ID Panel     Status: Abnormal   Collection Time: 07/05/21  7:00 PM   Specimen: BLOOD RIGHT ARM  Result Value Ref Range Status   Specimen Description BLOOD RIGHT ARM  Final   Special Requests   Final    BOTTLES DRAWN AEROBIC AND ANAEROBIC Blood Culture adequate volume   Culture  Setup Time   Final    GRAM POSITIVE COCCI IN BOTH AEROBIC AND ANAEROBIC BOTTLES CRITICAL VALUE NOTED.  VALUE IS CONSISTENT WITH PREVIOUSLY REPORTED AND CALLED VALUE.    Culture (A)  Final    STAPHYLOCOCCUS AUREUS SUSCEPTIBILITIES PERFORMED ON PREVIOUS CULTURE WITHIN THE LAST 5 DAYS. Performed at [facility], [place] [address], [place], [place] [address]    Report Status 07/09/2021 FINAL  Final  Culture, blood (Routine X 2) w Reflex to ID Panel      Status: Abnormal   Collection Time: 07/05/21  7:06 PM   Specimen: BLOOD LEFT WRIST  Result Value Ref Range Status   Specimen Description BLOOD LEFT WRIST  Final   Special Requests   Final    BOTTLES DRAWN AEROBIC AND ANAEROBIC Blood Culture results may not be optimal due to an inadequate volume of blood received in culture bottles   Culture  Setup Time   Final    GRAM POSITIVE COCCI IN BOTH AEROBIC AND ANAEROBIC BOTTLES CRITICAL VALUE NOTED.  VALUE IS CONSISTENT WITH PREVIOUSLY REPORTED AND CALLED VALUE.    Culture (A)  Final    STAPHYLOCOCCUS AUREUS SUSCEPTIBILITIES PERFORMED ON PREVIOUS CULTURE WITHIN THE LAST 5 DAYS. Performed at [facility], [place] [address], [place], [place] [address]    Report Status 07/08/2021 FINAL  Final  Culture, blood (Routine X 2) w Reflex to ID Panel     Status: None (Preliminary result)   Collection Time: 07/07/21  2:38 PM   Specimen: BLOOD RIGHT ARM  Result Value Ref Range Status   Specimen Description BLOOD RIGHT ARM  Final   Special Requests   Final    BOTTLES DRAWN AEROBIC ONLY Blood Culture results may not be optimal due to an inadequate volume of blood received in culture bottles   Culture   Final    NO GROWTH 2 DAYS Performed at [facility], [place] [address] [place], [place]  [address]    Report Status PENDING  Incomplete  Culture, blood (Routine X 2) w Reflex to ID Panel     Status: None (Preliminary result)   Collection Time: 07/07/21  2:39 PM   Specimen: BLOOD RIGHT HAND  Result Value Ref Range Status   Specimen Description BLOOD RIGHT HAND  Final   Special Requests   Final    BOTTLES DRAWN AEROBIC ONLY Blood Culture results may not be optimal due to an inadequate volume of blood received in culture bottles   Culture   Final    NO GROWTH 2 DAYS Performed at [facility], [place] [address] [place], [place] [address]    Report Status PENDING  Incomplete     Studies: No results found.  Scheduled Meds:   buprenorphine-naloxone  1.5 tablet Sublingual Daily   enoxaparin (LOVENOX) injection  40 mg Subcutaneous Q24H   famotidine  20 mg Oral Daily   feeding supplement  237 mL Oral TID BM   magic mouthwash  5 mL Oral QID   multivitamin with minerals  1 tablet Oral Daily   nicotine  21 mg Transdermal Daily   Continuous Infusions:  ceFTAROline (TEFLARO) IV 600 mg (07/09/21 0449)   DAPTOmycin (CUBICIN)  IV 500 mg (07/08/21 2026)    Principal Problem:   MRSA bacteremia Active Problems:   Severe opioid use disorder ([place])   Endocarditis   Septic pulmonary embolism (HCC)   Chronic viral hepatitis C ([place])   Shoulder abscess   Calf abscess   Odynophagia   Protein-calorie malnutrition, severe   Consultants: Infectious disease Cardiothoracic surgery  Procedures: TTE TEE  Antibiotics: Vancomycin 10/21 through 10/28 Valacyclovir 10/23 through 10/29 Daptomycin 10/28 >> Ceftaroline 10/28 >>   Time spent: 35 minutes    Erin Hearing ANP  [facility] 7 am - 330 pm/M-F for direct patient care and secure chat Please refer to Amion for contact info 10  days

## 2021-07-10 DIAGNOSIS — L02419 Cutaneous abscess of limb, unspecified: Secondary | ICD-10-CM

## 2021-07-10 DIAGNOSIS — B182 Chronic viral hepatitis C: Secondary | ICD-10-CM | POA: Diagnosis not present

## 2021-07-10 DIAGNOSIS — I330 Acute and subacute infective endocarditis: Secondary | ICD-10-CM | POA: Diagnosis not present

## 2021-07-10 DIAGNOSIS — R1310 Dysphagia, unspecified: Secondary | ICD-10-CM

## 2021-07-10 DIAGNOSIS — R7881 Bacteremia: Secondary | ICD-10-CM | POA: Diagnosis not present

## 2021-07-10 DIAGNOSIS — F1120 Opioid dependence, uncomplicated: Secondary | ICD-10-CM

## 2021-07-10 DIAGNOSIS — I2690 Septic pulmonary embolism without acute cor pulmonale: Secondary | ICD-10-CM

## 2021-07-10 DIAGNOSIS — B9562 Methicillin resistant Staphylococcus aureus infection as the cause of diseases classified elsewhere: Secondary | ICD-10-CM

## 2021-07-10 DIAGNOSIS — E43 Unspecified severe protein-calorie malnutrition: Secondary | ICD-10-CM

## 2021-07-10 NOTE — Progress Notes (Signed)
Subjective:  Feeling better   Antibiotics:  Anti-infectives (From admission, onward)    Start     Dose/Rate Route Frequency Ordered Stop   07/06/21 1400  ceftaroline (TEFLARO) 600 mg in sodium chloride 0.9 % 100 mL IVPB        600 mg 100 mL/hr over 60 Minutes Intravenous Every 8 hours 07/06/21 1033     07/06/21 1330  DAPTOmycin (CUBICIN) 500 mg in sodium chloride 0.9 % IVPB        500 mg 120 mL/hr over 30 Minutes Intravenous Daily 07/06/21 1233     07/01/21 2200  vancomycin (VANCOREADY) IVPB 750 mg/150 mL  Status:  Discontinued        750 mg 150 mL/hr over 60 Minutes Intravenous Every 12 hours 07/01/21 1314 07/06/21 1033   07/01/21 1400  valACYclovir (VALTREX) tablet 1,000 mg        1,000 mg Oral 2 times daily 07/01/21 1306 07/08/21 0959   06/30/21 0200  vancomycin (VANCOREADY) IVPB 750 mg/150 mL  Status:  Discontinued        750 mg 150 mL/hr over 60 Minutes Intravenous Every 12 hours 06/29/21 1253 06/29/21 1254   06/29/21 1700  vancomycin (VANCOREADY) IVPB 750 mg/150 mL  Status:  Discontinued        750 mg 150 mL/hr over 60 Minutes Intravenous Every 12 hours 06/29/21 1308 07/01/21 1301   06/29/21 1615  rifampin (RIFADIN) capsule 300 mg  Status:  Discontinued        300 mg Oral Daily 06/29/21 1606 06/29/21 1607   06/29/21 1130  vancomycin (VANCOREADY) IVPB 1250 mg/250 mL  Status:  Discontinued        1,250 mg 166.7 mL/hr over 90 Minutes Intravenous  Once 06/29/21 1124 06/29/21 1254   06/29/21 1130  ceFEPIme (MAXIPIME) 2 g in sodium chloride 0.9 % 100 mL IVPB  Status:  Discontinued        2 g 200 mL/hr over 30 Minutes Intravenous  Once 06/29/21 1124 06/29/21 1251       Medications: Scheduled Meds:  buprenorphine-naloxone  1.5 tablet Sublingual Daily   enoxaparin (LOVENOX) injection  40 mg Subcutaneous Q24H   famotidine  20 mg Oral Daily   feeding supplement  237 mL Oral TID BM   magic mouthwash  5 mL Oral QID   multivitamin with minerals  1 tablet Oral Daily    nicotine  21 mg Transdermal Daily   Continuous Infusions:  ceFTAROline (TEFLARO) IV 600 mg (07/10/21 0544)   DAPTOmycin (CUBICIN)  IV Stopped (07/09/21 2032)   PRN Meds:.acetaminophen **OR** acetaminophen, albuterol, ALPRAZolam, influenza vac split quadrivalent PF, ondansetron **OR** ondansetron (ZOFRAN) IV    Objective: Weight change:   Intake/Output Summary (Last 24 hours) at 07/10/2021 1125 Last data filed at 07/10/2021 0448 Gross per 24 hour  Intake 340 ml  Output --  Net 340 ml    Blood pressure 135/81, pulse 77, temperature 98.3 F (36.8 C), temperature source Oral, resp. rate 18, height 5\' 2"  (1.575 m), weight 59 kg, SpO2 97 %. Temp:  [98.3 F (36.8 C)-98.8 F (37.1 C)] 98.3 F (36.8 C) (11/01 0831) Pulse Rate:  [74-81] 77 (11/01 0831) Resp:  [16-18] 18 (11/01 0831) BP: (113-135)/(75-82) 135/81 (11/01 0831) SpO2:  [97 %-99 %] 97 % (11/01 0831)  Physical Exam: Physical Exam Constitutional:      General: She is not in acute distress.    Appearance: She is well-developed. She is not diaphoretic.  HENT:     Head: Normocephalic and atraumatic.     Right Ear: External ear normal.     Left Ear: External ear normal.     Mouth/Throat:     Pharynx: No oropharyngeal exudate.  Eyes:     General: No scleral icterus.    Extraocular Movements: Extraocular movements intact.     Conjunctiva/sclera: Conjunctivae normal.  Cardiovascular:     Rate and Rhythm: Normal rate and regular rhythm.     Heart sounds:    Friction rub present.  Pulmonary:     Effort: Pulmonary effort is normal. No respiratory distress.     Breath sounds: No wheezing or rales.  Abdominal:     General: Bowel sounds are normal. There is no distension.     Palpations: Abdomen is soft.     Tenderness: There is no abdominal tenderness. There is no rebound.  Musculoskeletal:        General: No tenderness. Normal range of motion.  Lymphadenopathy:     Cervical: No cervical adenopathy.  Skin:     General: Skin is warm and dry.     Coloration: Skin is not pale.     Findings: No erythema or rash.  Neurological:     General: No focal deficit present.     Mental Status: She is alert and oriented to person, place, and time.     Motor: No abnormal muscle tone.     Coordination: Coordination normal.  Psychiatric:        Mood and Affect: Mood normal.        Behavior: Behavior normal.        Thought Content: Thought content normal.        Judgment: Judgment normal.    Incision and shoulder is well-healed and same for left calf CBC:    BMET No results for input(s): NA, K, CL, CO2, GLUCOSE, BUN, CREATININE, CALCIUM in the last 72 hours.    Liver Panel  No results for input(s): PROT, ALBUMIN, AST, ALT, ALKPHOS, BILITOT, BILIDIR, IBILI in the last 72 hours.     Sedimentation Rate No results for input(s): ESRSEDRATE in the last 72 hours.  C-Reactive Protein No results for input(s): CRP in the last 72 hours.  Micro Results: Recent Results (from the past 720 hour(s))  Blood culture (routine x 2)     Status: Abnormal   Collection Time: 06/29/21  9:54 AM   Specimen: BLOOD RIGHT HAND  Result Value Ref Range Status   Specimen Description BLOOD RIGHT HAND  Final   Special Requests   Final    BOTTLES DRAWN AEROBIC ONLY Blood Culture results may not be optimal due to an inadequate volume of blood received in culture bottles   Culture  Setup Time   Final    GRAM POSITIVE COCCI AEROBIC BOTTLE ONLY CRITICAL RESULT CALLED TO, READ BACK BY AND VERIFIED WITH: G,BARR PHARMD @1338  06/30/21 EB Performed at [facility], [place] [address], [place],  [address]    Culture METHICILLIN RESISTANT STAPHYLOCOCCUS AUREUS (A)  Final   Report Status 07/02/2021 FINAL  Final   Organism ID, Bacteria METHICILLIN RESISTANT STAPHYLOCOCCUS AUREUS  Final      Susceptibility   Methicillin resistant staphylococcus aureus - MIC*    CIPROFLOXACIN <=0.5 SENSITIVE Sensitive     ERYTHROMYCIN >=8  RESISTANT Resistant     GENTAMICIN <=0.5 SENSITIVE Sensitive     OXACILLIN >=4 RESISTANT Resistant     TETRACYCLINE <=1 SENSITIVE Sensitive  VANCOMYCIN 1 SENSITIVE Sensitive     TRIMETH/SULFA <=10 SENSITIVE Sensitive     CLINDAMYCIN <=0.25 SENSITIVE Sensitive     RIFAMPIN <=0.5 SENSITIVE Sensitive     Inducible Clindamycin NEGATIVE Sensitive     * METHICILLIN RESISTANT STAPHYLOCOCCUS AUREUS  Blood Culture ID Panel (Reflexed)     Status: Abnormal   Collection Time: 06/29/21  9:54 AM  Result Value Ref Range Status   Enterococcus faecalis NOT DETECTED NOT DETECTED Final   Enterococcus Faecium NOT DETECTED NOT DETECTED Final   Listeria monocytogenes NOT DETECTED NOT DETECTED Final   Staphylococcus species DETECTED (A) NOT DETECTED Final    Comment: CRITICAL RESULT CALLED TO, READ BACK BY AND VERIFIED WITH: G,BARR PHARMD @1338  06/30/21 EB    Staphylococcus aureus (BCID) DETECTED (A) NOT DETECTED Final    Comment: Methicillin (oxacillin)-resistant Staphylococcus aureus (MRSA). MRSA is predictably resistant to beta-lactam antibiotics (except ceftaroline). Preferred therapy is vancomycin unless clinically contraindicated. Patient requires contact precautions if  hospitalized. CRITICAL RESULT CALLED TO, READ BACK BY AND VERIFIED WITH: G,BARR PHARMD @1338  06/30/21 EB    Staphylococcus epidermidis NOT DETECTED NOT DETECTED Final   Staphylococcus lugdunensis NOT DETECTED NOT DETECTED Final   Streptococcus species NOT DETECTED NOT DETECTED Final   Streptococcus agalactiae NOT DETECTED NOT DETECTED Final   Streptococcus pneumoniae NOT DETECTED NOT DETECTED Final   Streptococcus pyogenes NOT DETECTED NOT DETECTED Final   A.calcoaceticus-baumannii NOT DETECTED NOT DETECTED Final   Bacteroides fragilis NOT DETECTED NOT DETECTED Final   Enterobacterales NOT DETECTED NOT DETECTED Final   Enterobacter cloacae complex NOT DETECTED NOT DETECTED Final   Escherichia coli NOT DETECTED NOT DETECTED  Final   Klebsiella aerogenes NOT DETECTED NOT DETECTED Final   Klebsiella oxytoca NOT DETECTED NOT DETECTED Final   Klebsiella pneumoniae NOT DETECTED NOT DETECTED Final   Proteus species NOT DETECTED NOT DETECTED Final   Salmonella species NOT DETECTED NOT DETECTED Final   Serratia marcescens NOT DETECTED NOT DETECTED Final   Haemophilus influenzae NOT DETECTED NOT DETECTED Final   Neisseria meningitidis NOT DETECTED NOT DETECTED Final   Pseudomonas aeruginosa NOT DETECTED NOT DETECTED Final   Stenotrophomonas maltophilia NOT DETECTED NOT DETECTED Final   Candida albicans NOT DETECTED NOT DETECTED Final   Candida auris NOT DETECTED NOT DETECTED Final   Candida glabrata NOT DETECTED NOT DETECTED Final   Candida krusei NOT DETECTED NOT DETECTED Final   Candida parapsilosis NOT DETECTED NOT DETECTED Final   Candida tropicalis NOT DETECTED NOT DETECTED Final   Cryptococcus neoformans/gattii NOT DETECTED NOT DETECTED Final   Meth resistant mecA/C and MREJ DETECTED (A) NOT DETECTED Final    Comment: CRITICAL RESULT CALLED TO, READ BACK BY AND VERIFIED WITH: G,BARR PHARMD @1338  06/30/21 EB Performed at [facility], [address], [place], [place] [address]   Resp Panel by RT-PCR (Flu A&B, Covid) Nasopharyngeal Swab     Status: None   Collection Time: 06/29/21  9:55 AM   Specimen: Nasopharyngeal Swab; Nasopharyngeal(NP) swabs in vial transport medium  Result Value Ref Range Status   SARS Coronavirus 2 by RT PCR NEGATIVE NEGATIVE Final    Comment: (NOTE) SARS-CoV-2 target nucleic acids are NOT DETECTED.  The SARS-CoV-2 RNA is generally detectable in upper respiratory specimens during the acute phase of infection. The lowest concentration of SARS-CoV-2 viral copies this assay can detect is 138 copies/mL. A negative result does not preclude SARS-Cov-2 infection and should not be used as the sole basis for treatment or other patient management  decisions. A negative result may occur  with  improper specimen collection/handling, submission of specimen other than nasopharyngeal swab, presence of viral mutation(s) within the areas targeted by this assay, and inadequate number of viral copies(<138 copies/mL). A negative result must be combined with clinical observations, patient history, and epidemiological information. The expected result is Negative.  Fact Sheet for Patients:  EntrepreneurPulse.com.au  Fact Sheet for Healthcare Providers:  IncredibleEmployment.be  This test is no t yet approved or cleared by the [place] FDA and  has been authorized for detection and/or diagnosis of SARS-CoV-2 by FDA under an Emergency Use Authorization (EUA). This EUA will remain  in effect (meaning this test can be used) for the duration of the COVID-19 declaration under Section 564(b)(1) of the Act, 21 U.S.C.section 360bbb-3(b)(1), unless the authorization is terminated  or revoked sooner.       Influenza A by PCR NEGATIVE NEGATIVE Final   Influenza B by PCR NEGATIVE NEGATIVE Final    Comment: (NOTE) The Xpert Xpress SARS-CoV-2/FLU/RSV plus assay is intended as an aid in the diagnosis of influenza from Nasopharyngeal swab specimens and should not be used as a sole basis for treatment. Nasal washings and aspirates are unacceptable for Xpert Xpress SARS-CoV-2/FLU/RSV testing.  Fact Sheet for Patients: EntrepreneurPulse.com.au  Fact Sheet for Healthcare Providers: IncredibleEmployment.be  This test is not yet approved or cleared by the [place] FDA and has been authorized for detection and/or diagnosis of SARS-CoV-2 by FDA under an Emergency Use Authorization (EUA). This EUA will remain in effect (meaning this test can be used) for the duration of the COVID-19 declaration under Section 564(b)(1) of the Act, 21 U.S.C. section 360bbb-3(b)(1), unless the authorization is terminated  or revoked.  Performed at [facility], [place] [address], [place], [place] [address]   Blood culture (routine x 2)     Status: Abnormal   Collection Time: 06/29/21  3:15 PM   Specimen: BLOOD  Result Value Ref Range Status   Specimen Description BLOOD BLOOD LEFT FOREARM  Final   Special Requests   Final    BOTTLES DRAWN AEROBIC AND ANAEROBIC Blood Culture adequate volume   Culture  Setup Time   Final    GRAM POSITIVE COCCI ANAEROBIC BOTTLE ONLY CRITICAL VALUE NOTED.  VALUE IS CONSISTENT WITH PREVIOUSLY REPORTED AND CALLED VALUE.    Culture (A)  Final    STAPHYLOCOCCUS AUREUS SUSCEPTIBILITIES PERFORMED ON PREVIOUS CULTURE WITHIN THE LAST 5 DAYS. Performed at [facility], [place] [address], [place], [place] [address]    Report Status 07/03/2021 FINAL  Final  Culture, blood (routine x 2)     Status: Abnormal   Collection Time: 06/30/21  6:50 PM   Specimen: BLOOD  Result Value Ref Range Status   Specimen Description BLOOD LEFT ANTECUBITAL  Final   Special Requests   Final    BOTTLES DRAWN AEROBIC ONLY Blood Culture results may not be optimal due to an inadequate volume of blood received in culture bottles   Culture  Setup Time   Final    GRAM POSITIVE COCCI IN CLUSTERS AEROBIC BOTTLE ONLY CRITICAL VALUE NOTED.  VALUE IS CONSISTENT WITH PREVIOUSLY REPORTED AND CALLED VALUE.    Culture (A)  Final    STAPHYLOCOCCUS AUREUS SUSCEPTIBILITIES PERFORMED ON PREVIOUS CULTURE WITHIN THE LAST 5 DAYS. Performed at [facility], [place] [address] [place],  [address]    Report Status 07/02/2021 FINAL  Final  Culture, blood (routine x 2)  Status: Abnormal   Collection Time: 06/30/21  6:55 PM   Specimen: BLOOD  Result Value Ref Range Status   Specimen Description BLOOD LEFT ANTECUBITAL  Final   Special Requests   Final    BOTTLES DRAWN AEROBIC ONLY Blood Culture results may not be optimal due to an inadequate volume of blood received in culture bottles   Culture   Setup Time   Final    GRAM POSITIVE COCCI IN CLUSTERS AEROBIC BOTTLE ONLY CRITICAL VALUE NOTED.  VALUE IS CONSISTENT WITH PREVIOUSLY REPORTED AND CALLED VALUE.    Culture (A)  Final    STAPHYLOCOCCUS AUREUS SUSCEPTIBILITIES PERFORMED ON PREVIOUS CULTURE WITHIN THE LAST 5 DAYS. Performed at [facility], [place] [address] [place], [place] [address]    Report Status 07/02/2021 FINAL  Final  Culture, blood (routine x 2)     Status: Abnormal (Preliminary result)   Collection Time: 07/02/21  8:50 AM   Specimen: BLOOD  Result Value Ref Range Status   Specimen Description BLOOD SITE NOT SPECIFIED  Final   Special Requests IN PEDIATRIC BOTTLE Blood Culture adequate volume  Final   Culture  Setup Time   Final    GRAM POSITIVE COCCI AEROBIC BOTTLE ONLY CRITICAL VALUE NOTED.  VALUE IS CONSISTENT WITH PREVIOUSLY REPORTED AND CALLED VALUE.    Culture (A)  Final    STAPHYLOCOCCUS AUREUS Sent to [facility] for further susceptibility testing. Performed at [facility], [place] [address], [place], [place] [address]    Report Status PENDING  Incomplete  Culture, blood (routine x 2)     Status: None   Collection Time: 07/02/21 11:50 AM   Specimen: BLOOD RIGHT FOREARM  Result Value Ref Range Status   Specimen Description BLOOD RIGHT FOREARM  Final   Special Requests   Final    BOTTLES DRAWN AEROBIC AND ANAEROBIC Blood Culture adequate volume   Culture   Final    NO GROWTH 5 DAYS Performed at [facility], [place] [address], [place], [place] [address]    Report Status 07/07/2021 FINAL  Final  Culture, blood (routine x 2)     Status: Abnormal   Collection Time: 07/03/21  8:06 PM   Specimen: BLOOD  Result Value Ref Range Status   Specimen Description BLOOD RIGHT ARM  Final   Special Requests   Final    BOTTLES DRAWN AEROBIC AND ANAEROBIC Blood Culture adequate volume   Culture  Setup Time   Final    GRAM POSITIVE COCCI IN CLUSTERS ANAEROBIC BOTTLE ONLY CRITICAL VALUE NOTED.  VALUE  IS CONSISTENT WITH PREVIOUSLY REPORTED AND CALLED VALUE.    Culture (A)  Final    STAPHYLOCOCCUS AUREUS SUSCEPTIBILITIES PERFORMED ON PREVIOUS CULTURE WITHIN THE LAST 5 DAYS. Performed at [facility], [place] [address], [place], [place] [address]    Report Status 07/07/2021 FINAL  Final  Culture, blood (routine x 2)     Status: Abnormal   Collection Time: 07/03/21  8:06 PM   Specimen: BLOOD  Result Value Ref Range Status   Specimen Description BLOOD LEFT ARM  Final   Special Requests   Final    BOTTLES DRAWN AEROBIC AND ANAEROBIC Blood Culture adequate volume   Culture  Setup Time   Final    GRAM POSITIVE COCCI IN CLUSTERS ANAEROBIC BOTTLE ONLY CRITICAL VALUE NOTED.  VALUE IS CONSISTENT WITH PREVIOUSLY REPORTED AND CALLED VALUE. Performed at [facility], [place] [address] [place], [place] [address]  Culture METHICILLIN RESISTANT STAPHYLOCOCCUS AUREUS (A)  Final   Report Status 07/09/2021 FINAL  Final   Organism ID, Bacteria METHICILLIN RESISTANT STAPHYLOCOCCUS AUREUS  Final      Susceptibility   Methicillin resistant staphylococcus aureus - MIC*    CIPROFLOXACIN <=0.5 SENSITIVE Sensitive     ERYTHROMYCIN >=8 RESISTANT Resistant     GENTAMICIN <=0.5 SENSITIVE Sensitive     OXACILLIN >=4 RESISTANT Resistant     TETRACYCLINE <=1 SENSITIVE Sensitive     VANCOMYCIN 1 SENSITIVE Sensitive     TRIMETH/SULFA <=10 SENSITIVE Sensitive     CLINDAMYCIN <=0.25 SENSITIVE Sensitive     RIFAMPIN <=0.5 SENSITIVE Sensitive     Inducible Clindamycin NEGATIVE Sensitive     * METHICILLIN RESISTANT STAPHYLOCOCCUS AUREUS  Culture, blood (Routine X 2) w Reflex to ID Panel     Status: Abnormal   Collection Time: 07/05/21  7:00 PM   Specimen: BLOOD RIGHT ARM  Result Value Ref Range Status   Specimen Description BLOOD RIGHT ARM  Final   Special Requests   Final    BOTTLES DRAWN AEROBIC AND ANAEROBIC Blood Culture adequate volume   Culture  Setup Time   Final    GRAM POSITIVE COCCI IN  BOTH AEROBIC AND ANAEROBIC BOTTLES CRITICAL VALUE NOTED.  VALUE IS CONSISTENT WITH PREVIOUSLY REPORTED AND CALLED VALUE.    Culture (A)  Final    STAPHYLOCOCCUS AUREUS SUSCEPTIBILITIES PERFORMED ON PREVIOUS CULTURE WITHIN THE LAST 5 DAYS. Performed at [facility], [place] [address] [place], [place] [address]    Report Status 07/09/2021 FINAL  Final  Culture, blood (Routine X 2) w Reflex to ID Panel     Status: Abnormal   Collection Time: 07/05/21  7:06 PM   Specimen: BLOOD LEFT WRIST  Result Value Ref Range Status   Specimen Description BLOOD LEFT WRIST  Final   Special Requests   Final    BOTTLES DRAWN AEROBIC AND ANAEROBIC Blood Culture results may not be optimal due to an inadequate volume of blood received in culture bottles   Culture  Setup Time   Final    GRAM POSITIVE COCCI IN BOTH AEROBIC AND ANAEROBIC BOTTLES CRITICAL VALUE NOTED.  VALUE IS CONSISTENT WITH PREVIOUSLY REPORTED AND CALLED VALUE.    Culture (A)  Final    STAPHYLOCOCCUS AUREUS SUSCEPTIBILITIES PERFORMED ON PREVIOUS CULTURE WITHIN THE LAST 5 DAYS. Performed at [facility], [place] [address], [place], [place] [address]    Report Status 07/08/2021 FINAL  Final  Culture, blood (Routine X 2) w Reflex to ID Panel     Status: None (Preliminary result)   Collection Time: 07/07/21  2:38 PM   Specimen: BLOOD RIGHT ARM  Result Value Ref Range Status   Specimen Description BLOOD RIGHT ARM  Final   Special Requests   Final    BOTTLES DRAWN AEROBIC ONLY Blood Culture results may not be optimal due to an inadequate volume of blood received in culture bottles   Culture   Final    NO GROWTH 3 DAYS Performed at [facility], [place] [address], [place], [place] [address]    Report Status PENDING  Incomplete  Culture, blood (Routine X 2) w Reflex to ID Panel     Status: None (Preliminary result)   Collection Time: 07/07/21  2:39 PM   Specimen: BLOOD RIGHT HAND  Result Value Ref Range Status   Specimen  Description BLOOD RIGHT HAND  Final   Special Requests   Final  BOTTLES DRAWN AEROBIC ONLY Blood Culture results may not be optimal due to an inadequate volume of blood received in culture bottles   Culture   Final    NO GROWTH 3 DAYS Performed at [facility], [place] [address], [place], [place] [address]    Report Status PENDING  Incomplete    Studies/Results: No results found.    Assessment/Plan:  INTERVAL HISTORY:   Blood cultures still with no growth  Principal Problem:   MRSA bacteremia Active Problems:   Severe opioid use disorder (HCC)   Endocarditis   Septic pulmonary embolism (HCC)   Chronic viral hepatitis C ([place])   Shoulder abscess   Calf abscess   Odynophagia   Protein-calorie malnutrition, severe    Yvette Gaines is a 50 y.o. 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   #2 shoulder infection cath infection: Both seem resolved  3.  Hep C: can treat in clinic  4.  Consideration for HIV preexposure prophylaxis: Consider oral Truvada versus long-acting Apretude  IV drug use: Needs a long-term plan for coming off of her IV drugs with opiate replacement.        LOS: 11 days   Alcide Evener 07/10/2021, 11:25 AM

## 2021-07-10 NOTE — Progress Notes (Signed)
Mobility Specialist Criteria Algorithm Info.  Mobility Team: HOB elevated: Activity: Ambulated in hall; Ambulated to bathroom; Dangled on edge of bed Range of motion: Active; All extremities Level of assistance: Modified independent, requires aide device or extra time Assistive device: None (IV pole) Minutes sitting in chair:  Minutes stood:  Minutes ambulated: 10 minutes Distance ambulated (ft): 1100 ft Mobility response: Tolerated well Bed Position: Semi-fowlers  Patient received lying supine in bed willing to participate in mobility. Ambulated to bathroom then in hallway independently using IV pole to steady. Tolerated ambulation well without complaint or incident and was left dangling EOB with all needs met.   07/10/2021 4:13 PM

## 2021-07-10 NOTE — Progress Notes (Signed)
PROGRESS NOTE    Yvette Gaines  NID:[id number] DOB: May 24, 1971 DOA: 06/29/2021 PCP: [name], DO    Chief Complaint  Patient presents with   Chest Pain   Weakness   Shortness of Breath   Drug Problem    Brief Narrative:  Yvette Gaines is a 50 y.o. 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   Patient presented to [facility] on 10/05 for worsening of left shoulder swelling and pain.  Was found to have abscess of left shoulder and left calf,  I&D was done on 10/06 and culture showed MRSA.[name]  Patient however signed out [facility] after the surgery. On 10/16, patient came back to [facility] complaining about new onset of chest pains and shortness of breath, CT chest showed multiple bilateral septic emboli, echocardiogram showed new onset of tricuspid vegetation compatible with endocarditis.  Blood culture showed again MRSA.  Patient was started on vancomycin and cefepime since. During hospital stay, patient was also found to develop acute thrombocytopenia, DIC was ruled out.  Patient was admitted to [facility], and started on IV vancomycin, TEE with tricuspid leaflet vegetation.     Assessment & Plan:   Principal Problem:   MRSA bacteremia Active Problems:   Severe opioid use disorder ([place])   Endocarditis   Septic pulmonary embolism (HCC)   Chronic viral hepatitis C (HCC)   Shoulder abscess   Calf abscess   Odynophagia   Protein-calorie malnutrition, severe   MRSA bacteremia/MRSA TV endocarditis/septic emboli/recent right shoulder and right calf abscess status post I&D 10/6 at [facility] in a patient with IV drug use. -Blood cultures positive for MRSA on 10/21, 10/22.   -Blood cultures 1/2 positive from 10/24. -Blood cultures 1/2 positive from 10/25. -Blood  cultures 2 out of 2 positive from 10/27. -Blood cultures no growth for 72 hours so far from 10/29. -TEE consistent with tricuspid valve endocarditis.  Seen by CT surgery.  Decided against any need for surgery.  -Not a candidate to go home with PICC line, last drug use a week ago.   -Currently remains on dual therapy with Teflaro and daptomycin.  Followed by ID. -Recheck CBC and electrolytes tomorrow morning.  Renal functions tomorrow morning.  Thrombocytopenia: Resolved.   Recent right shoulder abscess -Status post I&D on 10/06 at [facility], according to records and imaging study and orthopedic note, there was no infection on [facility] joint itself, and patient recovered well. No acute issue now.   Severe protein calorie malnutrition Nutrition Status: Nutrition Problem: Severe Malnutrition Etiology: chronic illness, cancer and cancer related treatments Signs/Symptoms: severe fat depletion, severe muscle depletion Interventions: Ensure Enlive (each supplement provides 350kcal and 20 grams of protein), Magic cup, MVI    Mild intermittent asthma -At baseline.  As needed bronchodilator.   Cigarette smoking -Nicotine patch.  IV drug abuse -She wants to quit.  Started on Suboxone 1.5 mg daily.  Currently no evidence of withdrawals. -Patient will need follow-up with Suboxone clinic when near discharge.  Normocytic anemia -This is most likely anemia of acute illness/anemia of chronic disease, anemia panel with normal R44, folic acid, and ferritin level, iron level is low. -Transfuse for hemoglobin less than 7.     +HCV AB -ID recommended treatment after discharge.  DVT prophylaxis: Heparin Code Status: Full Family Communication: None  Disposition:   Status  is: Inpatient  Remains inpatient appropriate because: Bacteremia with need for IV antibiotics       Consultants:  ID CT surgery   Subjective:  Seen and examined in the morning rounds.  Denies any complaints.  Remains  afebrile.  On room air.  Objective: Vitals:   07/09/21 1450 07/09/21 2041 07/10/21 0529 07/10/21 0831  BP: 113/75 121/82 128/79 135/81  Pulse: 77 74 81 77  Resp: 18 16 16 18   Temp: 98.3 F (36.8 C) 98.6 F (37 C) 98.8 F (37.1 C) 98.3 F (36.8 C)  TempSrc: Oral Oral Oral Oral  SpO2: 98% 99% 97% 97%  Weight:      Height:        Intake/Output Summary (Last 24 hours) at 07/10/2021 1421 Last data filed at 07/10/2021 0448 Gross per 24 hour  Intake 340 ml  Output --  Net 340 ml    Filed Weights   06/29/21 1100 07/03/21 1237  Weight: 60.3 kg 59 kg    Examination:  General: Looks comfortable.  On room air. Cardiovascular: S1-S2 normal.  Regular rate rhythm. Respiratory: Bilateral clear.  No added sounds. Gastrointestinal: Soft and nontender.  Bowel sounds present. Ext: No cyanosis or swelling.  No edema. Neuro: No focal deficits.     Data Reviewed: I have personally reviewed following labs and imaging studies  CBC: Recent Labs  Lab 07/04/21 0135  WBC 10.5  HGB 7.8*  HCT 24.0*  MCV 87.6  PLT 548*    Basic Metabolic Panel: Recent Labs  Lab 07/04/21 0135  NA 134*  K 4.1  CL 107  CO2 21*  GLUCOSE 87  BUN 13  CREATININE 0.62  CALCIUM 7.7*    GFR: Estimated Creatinine Clearance: 66.5 mL/min (by C-G formula based on SCr of 0.62 mg/dL).  Liver Function Tests: No results for input(s): AST, ALT, ALKPHOS, BILITOT, PROT, ALBUMIN in the last 168 hours.   CBG: Recent Labs  Lab 07/03/21 2059  GLUCAP 117*     Recent Results (from the past 240 hour(s))  Culture, blood (routine x 2)     Status: Abnormal   Collection Time: 06/30/21  6:50 PM   Specimen: BLOOD  Result Value Ref Range Status   Specimen Description BLOOD LEFT ANTECUBITAL  Final   Special Requests   Final    BOTTLES DRAWN AEROBIC ONLY Blood Culture results may not be optimal due to an inadequate volume of blood received in culture bottles   Culture  Setup Time   Final    GRAM POSITIVE  COCCI IN CLUSTERS AEROBIC BOTTLE ONLY CRITICAL VALUE NOTED.  VALUE IS CONSISTENT WITH PREVIOUSLY REPORTED AND CALLED VALUE.    Culture (A)  Final    STAPHYLOCOCCUS AUREUS SUSCEPTIBILITIES PERFORMED ON PREVIOUS CULTURE WITHIN THE LAST 5 DAYS. Performed at [facility], [place] [address] [place], [place] [address]    Report Status 07/02/2021 FINAL  Final  Culture, blood (routine x 2)     Status: Abnormal   Collection Time: 06/30/21  6:55 PM   Specimen: BLOOD  Result Value Ref Range Status   Specimen Description BLOOD LEFT ANTECUBITAL  Final   Special Requests   Final    BOTTLES DRAWN AEROBIC ONLY Blood Culture results may not be optimal due to an inadequate volume of blood received in culture bottles   Culture  Setup Time   Final    GRAM POSITIVE COCCI IN CLUSTERS AEROBIC BOTTLE ONLY CRITICAL VALUE NOTED.  VALUE IS CONSISTENT WITH PREVIOUSLY REPORTED AND  CALLED VALUE.    Culture (A)  Final    STAPHYLOCOCCUS AUREUS SUSCEPTIBILITIES PERFORMED ON PREVIOUS CULTURE WITHIN THE LAST 5 DAYS. Performed at [facility], [place] [address] [place], [place] [address]    Report Status 07/02/2021 FINAL  Final  Culture, blood (routine x 2)     Status: Abnormal (Preliminary result)   Collection Time: 07/02/21  8:50 AM   Specimen: BLOOD  Result Value Ref Range Status   Specimen Description BLOOD SITE NOT SPECIFIED  Final   Special Requests IN PEDIATRIC BOTTLE Blood Culture adequate volume  Final   Culture  Setup Time   Final    GRAM POSITIVE COCCI AEROBIC BOTTLE ONLY CRITICAL VALUE NOTED.  VALUE IS CONSISTENT WITH PREVIOUSLY REPORTED AND CALLED VALUE.    Culture (A)  Final    STAPHYLOCOCCUS AUREUS Sent to [place] for further susceptibility testing. Performed at [facility], [place] [address] [place], [place] [address]    Report Status PENDING  Incomplete  Culture, blood (routine x 2)     Status: None   Collection Time: 07/02/21 11:50 AM   Specimen: BLOOD RIGHT FOREARM  Result  Value Ref Range Status   Specimen Description BLOOD RIGHT FOREARM  Final   Special Requests   Final    BOTTLES DRAWN AEROBIC AND ANAEROBIC Blood Culture adequate volume   Culture   Final    NO GROWTH 5 DAYS Performed at [facility], [place] [address] [place], [place] [address]    Report Status 07/07/2021 FINAL  Final  Culture, blood (routine x 2)     Status: Abnormal   Collection Time: 07/03/21  8:06 PM   Specimen: BLOOD  Result Value Ref Range Status   Specimen Description BLOOD RIGHT ARM  Final   Special Requests   Final    BOTTLES DRAWN AEROBIC AND ANAEROBIC Blood Culture adequate volume   Culture  Setup Time   Final    GRAM POSITIVE COCCI IN CLUSTERS ANAEROBIC BOTTLE ONLY CRITICAL VALUE NOTED.  VALUE IS CONSISTENT WITH PREVIOUSLY REPORTED AND CALLED VALUE.    Culture (A)  Final    STAPHYLOCOCCUS AUREUS SUSCEPTIBILITIES PERFORMED ON PREVIOUS CULTURE WITHIN THE LAST 5 DAYS. Performed at [facility], [place] [address], [place], [place] [address]    Report Status 07/07/2021 FINAL  Final  Culture, blood (routine x 2)     Status: Abnormal   Collection Time: 07/03/21  8:06 PM   Specimen: BLOOD  Result Value Ref Range Status   Specimen Description BLOOD LEFT ARM  Final   Special Requests   Final    BOTTLES DRAWN AEROBIC AND ANAEROBIC Blood Culture adequate volume   Culture  Setup Time   Final    GRAM POSITIVE COCCI IN CLUSTERS ANAEROBIC BOTTLE ONLY CRITICAL VALUE NOTED.  VALUE IS CONSISTENT WITH PREVIOUSLY REPORTED AND CALLED VALUE. Performed at [facility], [place] [address] [place], [place] [address]    Culture METHICILLIN RESISTANT STAPHYLOCOCCUS AUREUS (A)  Final   Report Status 07/09/2021 FINAL  Final   Organism ID, Bacteria METHICILLIN RESISTANT STAPHYLOCOCCUS AUREUS  Final      Susceptibility   Methicillin resistant staphylococcus aureus - MIC*    CIPROFLOXACIN <=0.5 SENSITIVE Sensitive     ERYTHROMYCIN >=8 RESISTANT Resistant     GENTAMICIN <=0.5  SENSITIVE Sensitive     OXACILLIN >=4 RESISTANT Resistant     TETRACYCLINE <=1 SENSITIVE Sensitive     VANCOMYCIN 1 SENSITIVE Sensitive  TRIMETH/SULFA <=10 SENSITIVE Sensitive     CLINDAMYCIN <=0.25 SENSITIVE Sensitive     RIFAMPIN <=0.5 SENSITIVE Sensitive     Inducible Clindamycin NEGATIVE Sensitive     * METHICILLIN RESISTANT STAPHYLOCOCCUS AUREUS  Culture, blood (Routine X 2) w Reflex to ID Panel     Status: Abnormal   Collection Time: 07/05/21  7:00 PM   Specimen: BLOOD RIGHT ARM  Result Value Ref Range Status   Specimen Description BLOOD RIGHT ARM  Final   Special Requests   Final    BOTTLES DRAWN AEROBIC AND ANAEROBIC Blood Culture adequate volume   Culture  Setup Time   Final    GRAM POSITIVE COCCI IN BOTH AEROBIC AND ANAEROBIC BOTTLES CRITICAL VALUE NOTED.  VALUE IS CONSISTENT WITH PREVIOUSLY REPORTED AND CALLED VALUE.    Culture (A)  Final    STAPHYLOCOCCUS AUREUS SUSCEPTIBILITIES PERFORMED ON PREVIOUS CULTURE WITHIN THE LAST 5 DAYS. Performed at [facility], [place] [address], [place], [place] [address]    Report Status 07/09/2021 FINAL  Final  Culture, blood (Routine X 2) w Reflex to ID Panel     Status: Abnormal   Collection Time: 07/05/21  7:06 PM   Specimen: BLOOD LEFT WRIST  Result Value Ref Range Status   Specimen Description BLOOD LEFT WRIST  Final   Special Requests   Final    BOTTLES DRAWN AEROBIC AND ANAEROBIC Blood Culture results may not be optimal due to an inadequate volume of blood received in culture bottles   Culture  Setup Time   Final    GRAM POSITIVE COCCI IN BOTH AEROBIC AND ANAEROBIC BOTTLES CRITICAL VALUE NOTED.  VALUE IS CONSISTENT WITH PREVIOUSLY REPORTED AND CALLED VALUE.    Culture (A)  Final    STAPHYLOCOCCUS AUREUS SUSCEPTIBILITIES PERFORMED ON PREVIOUS CULTURE WITHIN THE LAST 5 DAYS. Performed at [facility], [place] [address], [place], [place] [address]    Report Status 07/08/2021 FINAL  Final  Culture, blood (Routine X  2) w Reflex to ID Panel     Status: None (Preliminary result)   Collection Time: 07/07/21  2:38 PM   Specimen: BLOOD RIGHT ARM  Result Value Ref Range Status   Specimen Description BLOOD RIGHT ARM  Final   Special Requests   Final    BOTTLES DRAWN AEROBIC ONLY Blood Culture results may not be optimal due to an inadequate volume of blood received in culture bottles   Culture   Final    NO GROWTH 3 DAYS Performed at [facility], [place] [address], [place], [place] [address]    Report Status PENDING  Incomplete  Culture, blood (Routine X 2) w Reflex to ID Panel     Status: None (Preliminary result)   Collection Time: 07/07/21  2:39 PM   Specimen: BLOOD RIGHT HAND  Result Value Ref Range Status   Specimen Description BLOOD RIGHT HAND  Final   Special Requests   Final    BOTTLES DRAWN AEROBIC ONLY Blood Culture results may not be optimal due to an inadequate volume of blood received in culture bottles   Culture   Final    NO GROWTH 3 DAYS Performed at [facility], [place] [address], [place], [place] [address]    Report Status PENDING  Incomplete         Radiology Studies: No results found.      Scheduled Meds:  buprenorphine-naloxone  1.5 tablet Sublingual Daily   enoxaparin (LOVENOX) injection  40  mg Subcutaneous Q24H   famotidine  20 mg Oral Daily   feeding supplement  237 mL Oral TID BM   magic mouthwash  5 mL Oral QID   multivitamin with minerals  1 tablet Oral Daily   nicotine  21 mg Transdermal Daily   Continuous Infusions:  ceFTAROline (TEFLARO) IV 600 mg (07/10/21 1319)   DAPTOmycin (CUBICIN)  IV Stopped (07/09/21 2032)     LOS: 11 days   Total time spent: 20 minutes    Barb Merino, MD [facility]  07/10/2021, 2:21 PM

## 2021-07-10 NOTE — Progress Notes (Signed)
Subjective:  No new complaints   Antibiotics:  Anti-infectives (From admission, onward)    Start     Dose/Rate Route Frequency Ordered Stop   07/06/21 1400  ceftaroline (TEFLARO) 600 mg in sodium chloride 0.9 % 100 mL IVPB        600 mg 100 mL/hr over 60 Minutes Intravenous Every 8 hours 07/06/21 1033     07/06/21 1330  DAPTOmycin (CUBICIN) 500 mg in sodium chloride 0.9 % IVPB        500 mg 120 mL/hr over 30 Minutes Intravenous Daily 07/06/21 1233     07/01/21 2200  vancomycin (VANCOREADY) IVPB 750 mg/150 mL  Status:  Discontinued        750 mg 150 mL/hr over 60 Minutes Intravenous Every 12 hours 07/01/21 1314 07/06/21 1033   07/01/21 1400  valACYclovir (VALTREX) tablet 1,000 mg        1,000 mg Oral 2 times daily 07/01/21 1306 07/08/21 0959   06/30/21 0200  vancomycin (VANCOREADY) IVPB 750 mg/150 mL  Status:  Discontinued        750 mg 150 mL/hr over 60 Minutes Intravenous Every 12 hours 06/29/21 1253 06/29/21 1254   06/29/21 1700  vancomycin (VANCOREADY) IVPB 750 mg/150 mL  Status:  Discontinued        750 mg 150 mL/hr over 60 Minutes Intravenous Every 12 hours 06/29/21 1308 07/01/21 1301   06/29/21 1615  rifampin (RIFADIN) capsule 300 mg  Status:  Discontinued        300 mg Oral Daily 06/29/21 1606 06/29/21 1607   06/29/21 1130  vancomycin (VANCOREADY) IVPB 1250 mg/250 mL  Status:  Discontinued        1,250 mg 166.7 mL/hr over 90 Minutes Intravenous  Once 06/29/21 1124 06/29/21 1254   06/29/21 1130  ceFEPIme (MAXIPIME) 2 g in sodium chloride 0.9 % 100 mL IVPB  Status:  Discontinued        2 g 200 mL/hr over 30 Minutes Intravenous  Once 06/29/21 1124 06/29/21 1251       Medications: Scheduled Meds:  buprenorphine-naloxone  1.5 tablet Sublingual Daily   enoxaparin (LOVENOX) injection  40 mg Subcutaneous Q24H   famotidine  20 mg Oral Daily   feeding supplement  237 mL Oral TID BM   magic mouthwash  5 mL Oral QID   multivitamin with minerals  1 tablet Oral  Daily   nicotine  21 mg Transdermal Daily   Continuous Infusions:  ceFTAROline (TEFLARO) IV 600 mg (07/10/21 0544)   DAPTOmycin (CUBICIN)  IV Stopped (07/09/21 2032)   PRN Meds:.acetaminophen **OR** acetaminophen, albuterol, ALPRAZolam, influenza vac split quadrivalent PF, ondansetron **OR** ondansetron (ZOFRAN) IV    Objective: Weight change:   Intake/Output Summary (Last 24 hours) at 07/10/2021 0837 Last data filed at 07/10/2021 0448 Gross per 24 hour  Intake 340 ml  Output --  Net 340 ml    Blood pressure 135/81, pulse 77, temperature 98.3 F (36.8 C), temperature source Oral, resp. rate 18, height 5\' 2"  (1.575 m), weight 59 kg, SpO2 97 %. Temp:  [98.3 F (36.8 C)-98.8 F (37.1 C)] 98.3 F (36.8 C) (11/01 0831) Pulse Rate:  [74-81] 77 (11/01 0831) Resp:  [16-18] 18 (11/01 0831) BP: (113-135)/(75-82) 135/81 (11/01 0831) SpO2:  [97 %-99 %] 97 % (11/01 0831)  Physical Exam: Physical Exam Constitutional:      General: She is not in acute distress.    Appearance: She is well-developed. She is not diaphoretic.  HENT:     Head: Normocephalic and atraumatic.     Right Ear: External ear normal.     Left Ear: External ear normal.     Mouth/Throat:     Pharynx: No oropharyngeal exudate.  Eyes:     General: No scleral icterus.    Conjunctiva/sclera: Conjunctivae normal.     Pupils: Pupils are equal, round, and reactive to light.  Cardiovascular:     Rate and Rhythm: Normal rate and regular rhythm.     Heart sounds:    Friction rub present.  Pulmonary:     Effort: Pulmonary effort is normal. No respiratory distress.     Breath sounds: No wheezing.  Abdominal:     General: Bowel sounds are normal. There is no distension.     Palpations: Abdomen is soft.     Tenderness: There is no abdominal tenderness. There is no rebound.  Musculoskeletal:        General: No tenderness. Normal range of motion.  Lymphadenopathy:     Cervical: No cervical adenopathy.  Skin:     General: Skin is warm and dry.     Coloration: Skin is not pale.     Findings: No erythema or rash.  Neurological:     General: No focal deficit present.     Mental Status: She is alert and oriented to person, place, and time.     Motor: No abnormal muscle tone.     Coordination: Coordination normal.  Psychiatric:        Mood and Affect: Mood normal.        Behavior: Behavior normal.        Thought Content: Thought content normal.        Judgment: Judgment normal.    Incision and shoulder is well-healed and same for left calf CBC:    BMET No results for input(s): NA, K, CL, CO2, GLUCOSE, BUN, CREATININE, CALCIUM in the last 72 hours.    Liver Panel  No results for input(s): PROT, ALBUMIN, AST, ALT, ALKPHOS, BILITOT, BILIDIR, IBILI in the last 72 hours.     Sedimentation Rate No results for input(s): ESRSEDRATE in the last 72 hours.  C-Reactive Protein No results for input(s): CRP in the last 72 hours.  Micro Results: Recent Results (from the past 720 hour(s))  Blood culture (routine x 2)     Status: Abnormal   Collection Time: 06/29/21  9:54 AM   Specimen: BLOOD RIGHT HAND  Result Value Ref Range Status   Specimen Description BLOOD RIGHT HAND  Final   Special Requests   Final    BOTTLES DRAWN AEROBIC ONLY Blood Culture results may not be optimal due to an inadequate volume of blood received in culture bottles   Culture  Setup Time   Final    GRAM POSITIVE COCCI AEROBIC BOTTLE ONLY CRITICAL RESULT CALLED TO, READ BACK BY AND VERIFIED WITH: G,BARR PHARMD @1338  06/30/21 EB Performed at [facility], [place] [address] [place], [place] [address]    Culture METHICILLIN RESISTANT STAPHYLOCOCCUS AUREUS (A)  Final   Report Status 07/02/2021 FINAL  Final   Organism ID, Bacteria METHICILLIN RESISTANT STAPHYLOCOCCUS AUREUS  Final      Susceptibility   Methicillin resistant staphylococcus aureus - MIC*    CIPROFLOXACIN <=0.5 SENSITIVE Sensitive     ERYTHROMYCIN >=8  RESISTANT Resistant     GENTAMICIN <=0.5 SENSITIVE Sensitive     OXACILLIN >=4 RESISTANT Resistant     TETRACYCLINE <=1 SENSITIVE Sensitive  VANCOMYCIN 1 SENSITIVE Sensitive     TRIMETH/SULFA <=10 SENSITIVE Sensitive     CLINDAMYCIN <=0.25 SENSITIVE Sensitive     RIFAMPIN <=0.5 SENSITIVE Sensitive     Inducible Clindamycin NEGATIVE Sensitive     * METHICILLIN RESISTANT STAPHYLOCOCCUS AUREUS  Blood Culture ID Panel (Reflexed)     Status: Abnormal   Collection Time: 06/29/21  9:54 AM  Result Value Ref Range Status   Enterococcus faecalis NOT DETECTED NOT DETECTED Final   Enterococcus Faecium NOT DETECTED NOT DETECTED Final   Listeria monocytogenes NOT DETECTED NOT DETECTED Final   Staphylococcus species DETECTED (A) NOT DETECTED Final    Comment: CRITICAL RESULT CALLED TO, READ BACK BY AND VERIFIED WITH: G,BARR PHARMD @1338  06/30/21 EB    Staphylococcus aureus (BCID) DETECTED (A) NOT DETECTED Final    Comment: Methicillin (oxacillin)-resistant Staphylococcus aureus (MRSA). MRSA is predictably resistant to beta-lactam antibiotics (except ceftaroline). Preferred therapy is vancomycin unless clinically contraindicated. Patient requires contact precautions if  hospitalized. CRITICAL RESULT CALLED TO, READ BACK BY AND VERIFIED WITH: G,BARR PHARMD @1338  06/30/21 EB    Staphylococcus epidermidis NOT DETECTED NOT DETECTED Final   Staphylococcus lugdunensis NOT DETECTED NOT DETECTED Final   Streptococcus species NOT DETECTED NOT DETECTED Final   Streptococcus agalactiae NOT DETECTED NOT DETECTED Final   Streptococcus pneumoniae NOT DETECTED NOT DETECTED Final   Streptococcus pyogenes NOT DETECTED NOT DETECTED Final   A.calcoaceticus-baumannii NOT DETECTED NOT DETECTED Final   Bacteroides fragilis NOT DETECTED NOT DETECTED Final   Enterobacterales NOT DETECTED NOT DETECTED Final   Enterobacter cloacae complex NOT DETECTED NOT DETECTED Final   Escherichia coli NOT DETECTED NOT DETECTED  Final   Klebsiella aerogenes NOT DETECTED NOT DETECTED Final   Klebsiella oxytoca NOT DETECTED NOT DETECTED Final   Klebsiella pneumoniae NOT DETECTED NOT DETECTED Final   Proteus species NOT DETECTED NOT DETECTED Final   Salmonella species NOT DETECTED NOT DETECTED Final   Serratia marcescens NOT DETECTED NOT DETECTED Final   Haemophilus influenzae NOT DETECTED NOT DETECTED Final   Neisseria meningitidis NOT DETECTED NOT DETECTED Final   Pseudomonas aeruginosa NOT DETECTED NOT DETECTED Final   Stenotrophomonas maltophilia NOT DETECTED NOT DETECTED Final   Candida albicans NOT DETECTED NOT DETECTED Final   Candida auris NOT DETECTED NOT DETECTED Final   Candida glabrata NOT DETECTED NOT DETECTED Final   Candida krusei NOT DETECTED NOT DETECTED Final   Candida parapsilosis NOT DETECTED NOT DETECTED Final   Candida tropicalis NOT DETECTED NOT DETECTED Final   Cryptococcus neoformans/gattii NOT DETECTED NOT DETECTED Final   Meth resistant mecA/C and MREJ DETECTED (A) NOT DETECTED Final    Comment: CRITICAL RESULT CALLED TO, READ BACK BY AND VERIFIED WITH: G,BARR PHARMD @1338  06/30/21 EB Performed at [facility], [address] [place], [place] [address]   Resp Panel by RT-PCR (Flu A&B, Covid) Nasopharyngeal Swab     Status: None   Collection Time: 06/29/21  9:55 AM   Specimen: Nasopharyngeal Swab; Nasopharyngeal(NP) swabs in vial transport medium  Result Value Ref Range Status   SARS Coronavirus 2 by RT PCR NEGATIVE NEGATIVE Final    Comment: (NOTE) SARS-CoV-2 target nucleic acids are NOT DETECTED.  The SARS-CoV-2 RNA is generally detectable in upper respiratory specimens during the acute phase of infection. The lowest concentration of SARS-CoV-2 viral copies this assay can detect is 138 copies/mL. A negative result does not preclude SARS-Cov-2 infection and should not be used as the sole basis for treatment or other patient management  decisions. A negative result may occur  with  improper specimen collection/handling, submission of specimen other than nasopharyngeal swab, presence of viral mutation(s) within the areas targeted by this assay, and inadequate number of viral copies(<138 copies/mL). A negative result must be combined with clinical observations, patient history, and epidemiological information. The expected result is Negative.  Fact Sheet for Patients:  EntrepreneurPulse.com.au  Fact Sheet for Healthcare Providers:  IncredibleEmployment.be  This test is no t yet approved or cleared by the [place] FDA and  has been authorized for detection and/or diagnosis of SARS-CoV-2 by FDA under an Emergency Use Authorization (EUA). This EUA will remain  in effect (meaning this test can be used) for the duration of the COVID-19 declaration under Section 564(b)(1) of the Act, 21 U.S.C.section 360bbb-3(b)(1), unless the authorization is terminated  or revoked sooner.       Influenza A by PCR NEGATIVE NEGATIVE Final   Influenza B by PCR NEGATIVE NEGATIVE Final    Comment: (NOTE) The Xpert Xpress SARS-CoV-2/FLU/RSV plus assay is intended as an aid in the diagnosis of influenza from Nasopharyngeal swab specimens and should not be used as a sole basis for treatment. Nasal washings and aspirates are unacceptable for Xpert Xpress SARS-CoV-2/FLU/RSV testing.  Fact Sheet for Patients: EntrepreneurPulse.com.au  Fact Sheet for Healthcare Providers: IncredibleEmployment.be  This test is not yet approved or cleared by the [place] FDA and has been authorized for detection and/or diagnosis of SARS-CoV-2 by FDA under an Emergency Use Authorization (EUA). This EUA will remain in effect (meaning this test can be used) for the duration of the COVID-19 declaration under Section 564(b)(1) of the Act, 21 U.S.C. section 360bbb-3(b)(1), unless the authorization is terminated  or revoked.  Performed at [facility], [place] [address] [place], [place] [address]   Blood culture (routine x 2)     Status: Abnormal   Collection Time: 06/29/21  3:15 PM   Specimen: BLOOD  Result Value Ref Range Status   Specimen Description BLOOD BLOOD LEFT FOREARM  Final   Special Requests   Final    BOTTLES DRAWN AEROBIC AND ANAEROBIC Blood Culture adequate volume   Culture  Setup Time   Final    GRAM POSITIVE COCCI ANAEROBIC BOTTLE ONLY CRITICAL VALUE NOTED.  VALUE IS CONSISTENT WITH PREVIOUSLY REPORTED AND CALLED VALUE.    Culture (A)  Final    STAPHYLOCOCCUS AUREUS SUSCEPTIBILITIES PERFORMED ON PREVIOUS CULTURE WITHIN THE LAST 5 DAYS. Performed at [facility], [place] [address] [place], [place] [address]    Report Status 07/03/2021 FINAL  Final  Culture, blood (routine x 2)     Status: Abnormal   Collection Time: 06/30/21  6:50 PM   Specimen: BLOOD  Result Value Ref Range Status   Specimen Description BLOOD LEFT ANTECUBITAL  Final   Special Requests   Final    BOTTLES DRAWN AEROBIC ONLY Blood Culture results may not be optimal due to an inadequate volume of blood received in culture bottles   Culture  Setup Time   Final    GRAM POSITIVE COCCI IN CLUSTERS AEROBIC BOTTLE ONLY CRITICAL VALUE NOTED.  VALUE IS CONSISTENT WITH PREVIOUSLY REPORTED AND CALLED VALUE.    Culture (A)  Final    STAPHYLOCOCCUS AUREUS SUSCEPTIBILITIES PERFORMED ON PREVIOUS CULTURE WITHIN THE LAST 5 DAYS. Performed at [facility], [place] [address], [place], [place] [address]    Report Status 07/02/2021 FINAL  Final  Culture, blood (routine x 2)  Status: Abnormal   Collection Time: 06/30/21  6:55 PM   Specimen: BLOOD  Result Value Ref Range Status   Specimen Description BLOOD LEFT ANTECUBITAL  Final   Special Requests   Final    BOTTLES DRAWN AEROBIC ONLY Blood Culture results may not be optimal due to an inadequate volume of blood received in culture bottles   Culture   Setup Time   Final    GRAM POSITIVE COCCI IN CLUSTERS AEROBIC BOTTLE ONLY CRITICAL VALUE NOTED.  VALUE IS CONSISTENT WITH PREVIOUSLY REPORTED AND CALLED VALUE.    Culture (A)  Final    STAPHYLOCOCCUS AUREUS SUSCEPTIBILITIES PERFORMED ON PREVIOUS CULTURE WITHIN THE LAST 5 DAYS. Performed at [facility], [place] [address] [place], [place] [address]    Report Status 07/02/2021 FINAL  Final  Culture, blood (routine x 2)     Status: Abnormal (Preliminary result)   Collection Time: 07/02/21  8:50 AM   Specimen: BLOOD  Result Value Ref Range Status   Specimen Description BLOOD SITE NOT SPECIFIED  Final   Special Requests IN PEDIATRIC BOTTLE Blood Culture adequate volume  Final   Culture  Setup Time   Final    GRAM POSITIVE COCCI AEROBIC BOTTLE ONLY CRITICAL VALUE NOTED.  VALUE IS CONSISTENT WITH PREVIOUSLY REPORTED AND CALLED VALUE.    Culture (A)  Final    STAPHYLOCOCCUS AUREUS Sent to [facility] for further susceptibility testing. Performed at [facility], [place] [address] [place], [place] [address]    Report Status PENDING  Incomplete  Culture, blood (routine x 2)     Status: None   Collection Time: 07/02/21 11:50 AM   Specimen: BLOOD RIGHT FOREARM  Result Value Ref Range Status   Specimen Description BLOOD RIGHT FOREARM  Final   Special Requests   Final    BOTTLES DRAWN AEROBIC AND ANAEROBIC Blood Culture adequate volume   Culture   Final    NO GROWTH 5 DAYS Performed at [facility], [place] [address], [place], [place] [address]    Report Status 07/07/2021 FINAL  Final  Culture, blood (routine x 2)     Status: Abnormal   Collection Time: 07/03/21  8:06 PM   Specimen: BLOOD  Result Value Ref Range Status   Specimen Description BLOOD RIGHT ARM  Final   Special Requests   Final    BOTTLES DRAWN AEROBIC AND ANAEROBIC Blood Culture adequate volume   Culture  Setup Time   Final    GRAM POSITIVE COCCI IN CLUSTERS ANAEROBIC BOTTLE ONLY CRITICAL VALUE NOTED.  VALUE  IS CONSISTENT WITH PREVIOUSLY REPORTED AND CALLED VALUE.    Culture (A)  Final    STAPHYLOCOCCUS AUREUS SUSCEPTIBILITIES PERFORMED ON PREVIOUS CULTURE WITHIN THE LAST 5 DAYS. Performed at [facility], [place] [address] [place], [place] [address]    Report Status 07/07/2021 FINAL  Final  Culture, blood (routine x 2)     Status: Abnormal   Collection Time: 07/03/21  8:06 PM   Specimen: BLOOD  Result Value Ref Range Status   Specimen Description BLOOD LEFT ARM  Final   Special Requests   Final    BOTTLES DRAWN AEROBIC AND ANAEROBIC Blood Culture adequate volume   Culture  Setup Time   Final    GRAM POSITIVE COCCI IN CLUSTERS ANAEROBIC BOTTLE ONLY CRITICAL VALUE NOTED.  VALUE IS CONSISTENT WITH PREVIOUSLY REPORTED AND CALLED VALUE. Performed at [facility], [place] [address] [place], [place] [address]  Culture METHICILLIN RESISTANT STAPHYLOCOCCUS AUREUS (A)  Final   Report Status 07/09/2021 FINAL  Final   Organism ID, Bacteria METHICILLIN RESISTANT STAPHYLOCOCCUS AUREUS  Final      Susceptibility   Methicillin resistant staphylococcus aureus - MIC*    CIPROFLOXACIN <=0.5 SENSITIVE Sensitive     ERYTHROMYCIN >=8 RESISTANT Resistant     GENTAMICIN <=0.5 SENSITIVE Sensitive     OXACILLIN >=4 RESISTANT Resistant     TETRACYCLINE <=1 SENSITIVE Sensitive     VANCOMYCIN 1 SENSITIVE Sensitive     TRIMETH/SULFA <=10 SENSITIVE Sensitive     CLINDAMYCIN <=0.25 SENSITIVE Sensitive     RIFAMPIN <=0.5 SENSITIVE Sensitive     Inducible Clindamycin NEGATIVE Sensitive     * METHICILLIN RESISTANT STAPHYLOCOCCUS AUREUS  Culture, blood (Routine X 2) w Reflex to ID Panel     Status: Abnormal   Collection Time: 07/05/21  7:00 PM   Specimen: BLOOD RIGHT ARM  Result Value Ref Range Status   Specimen Description BLOOD RIGHT ARM  Final   Special Requests   Final    BOTTLES DRAWN AEROBIC AND ANAEROBIC Blood Culture adequate volume   Culture  Setup Time   Final    GRAM POSITIVE COCCI IN  BOTH AEROBIC AND ANAEROBIC BOTTLES CRITICAL VALUE NOTED.  VALUE IS CONSISTENT WITH PREVIOUSLY REPORTED AND CALLED VALUE.    Culture (A)  Final    STAPHYLOCOCCUS AUREUS SUSCEPTIBILITIES PERFORMED ON PREVIOUS CULTURE WITHIN THE LAST 5 DAYS. Performed at [facility], [place] [address], [place], [place] [address]    Report Status 07/09/2021 FINAL  Final  Culture, blood (Routine X 2) w Reflex to ID Panel     Status: Abnormal   Collection Time: 07/05/21  7:06 PM   Specimen: BLOOD LEFT WRIST  Result Value Ref Range Status   Specimen Description BLOOD LEFT WRIST  Final   Special Requests   Final    BOTTLES DRAWN AEROBIC AND ANAEROBIC Blood Culture results may not be optimal due to an inadequate volume of blood received in culture bottles   Culture  Setup Time   Final    GRAM POSITIVE COCCI IN BOTH AEROBIC AND ANAEROBIC BOTTLES CRITICAL VALUE NOTED.  VALUE IS CONSISTENT WITH PREVIOUSLY REPORTED AND CALLED VALUE.    Culture (A)  Final    STAPHYLOCOCCUS AUREUS SUSCEPTIBILITIES PERFORMED ON PREVIOUS CULTURE WITHIN THE LAST 5 DAYS. Performed at [facility], [place] [address], [place], [place] [address]    Report Status 07/08/2021 FINAL  Final  Culture, blood (Routine X 2) w Reflex to ID Panel     Status: None (Preliminary result)   Collection Time: 07/07/21  2:38 PM   Specimen: BLOOD RIGHT ARM  Result Value Ref Range Status   Specimen Description BLOOD RIGHT ARM  Final   Special Requests   Final    BOTTLES DRAWN AEROBIC ONLY Blood Culture results may not be optimal due to an inadequate volume of blood received in culture bottles   Culture   Final    NO GROWTH 3 DAYS Performed at [facility], [place] [address], [place], [place] [address]    Report Status PENDING  Incomplete  Culture, blood (Routine X 2) w Reflex to ID Panel     Status: None (Preliminary result)   Collection Time: 07/07/21  2:39 PM   Specimen: BLOOD RIGHT HAND  Result Value Ref Range Status   Specimen  Description BLOOD RIGHT HAND  Final   Special Requests   Final  BOTTLES DRAWN AEROBIC ONLY Blood Culture results may not be optimal due to an inadequate volume of blood received in culture bottles   Culture   Final    NO GROWTH 3 DAYS Performed at [facility], [place] [address], [place], [place] [address]    Report Status PENDING  Incomplete    Studies/Results: No results found.    Assessment/Plan:  INTERVAL HISTORY:   Yesterday her blood cultures were negative from the 29th  Principal Problem:   MRSA bacteremia Active Problems:   Severe opioid use disorder ([place])   Endocarditis   Septic pulmonary embolism ([facility])   Chronic viral hepatitis C ([place])   Shoulder abscess   Calf abscess   Odynophagia   Protein-calorie malnutrition, severe    Yvette Gaines is a 50 y.o. 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  I have reviewed her blood cultures from the 29th and there are no growth to date   Cardiology and cardiothoracic surgery are following the patient and greatly appreciate their help   #2 shoulder infection cath infection: Both seem resolved  3.  Hep chronic hepatitis C without hepatic coma: Happy to treat this  4.  Consideration for HIV preexposure prophylaxis: Consider oral Truvada versus long-acting Apretude  IV drug use: Needs a long-term plan for coming off of her IV drugs with opiate replacement.        LOS: 11 days   Alcide Evener 07/10/2021, 8:37 AM

## 2021-07-10 NOTE — TOC Progression Note (Signed)
Transition of Care [facility]) - Progression Note    Patient Details  Name: Yvette Gaines MRN: [id number] Date of Birth: [birth year]  Transition of Care [facility]) CM/SW C[place], RN Phone Number: 07/10/2021, 3:04 PM  Clinical Narrative:    CM met with the patient at the bedside.  The patient currently lives with her ex-husband at the home.  The patient's ex-husband works during the day and will be able to provide transportation for the patient when she is discharged home from the hospital.  The patient is aware that she will receive IV antibiotics for the next 6 weeks in the hospital as an inpatient.  The patient is receiving Suboxone treatment and is not a candidate for SNF placement at [facility] for DTP bed at this time considering the patient's current prescribed need for the suboxone.  Rande Brunt, CM with [facility] is aware of this barrier to discharge along with need for IV antibiotics.  The patient currently has a peripheral IV and no PICC line.  The patient has recent history of leaving AMA from area hospital but is currently pleasant and agreeable for admission stay here and IV antibiotics treatment.  The patient is currently covered for admission through [facility] and has already signed up for health benefits through the insurance company for the next year as well.  The patient will needed follow up at free health clinic in [place], [place] prior to discharge from the hospital since the patient is unable to afford PCP visits with her previous PCP.  The patient uses [facility] for pharmacy needs.  CM and MSW with DTP Team will continue to follow the patient for discharge needs for home - pending discharge in 6 weeks after completion of IV antibiotics as inpatient.   Expected Discharge Plan: Home/Self Care Barriers to Discharge: Other (must enter comment) (Recent history of IVDU - needs IV antibiotics for next 6 weeks, tx with suboxone)  Expected Discharge  Plan and Services Expected Discharge Plan: Home/Self Care In-house Referral: PCP / [facility], Financial Counselor Discharge Planning Services: CM Consult   Living arrangements for the past 2 months: Apartment                                       Social Determinants of Health (SDOH) Interventions    Readmission Risk Interventions No flowsheet data found.

## 2021-07-11 DIAGNOSIS — B182 Chronic viral hepatitis C: Secondary | ICD-10-CM | POA: Diagnosis not present

## 2021-07-11 DIAGNOSIS — B9562 Methicillin resistant Staphylococcus aureus infection as the cause of diseases classified elsewhere: Secondary | ICD-10-CM | POA: Diagnosis not present

## 2021-07-11 DIAGNOSIS — L02419 Cutaneous abscess of limb, unspecified: Secondary | ICD-10-CM | POA: Diagnosis not present

## 2021-07-11 DIAGNOSIS — I330 Acute and subacute infective endocarditis: Secondary | ICD-10-CM | POA: Diagnosis not present

## 2021-07-11 DIAGNOSIS — R7881 Bacteremia: Secondary | ICD-10-CM | POA: Diagnosis not present

## 2021-07-11 LAB — CBC WITH DIFFERENTIAL/PLATELET
Abs Immature Granulocytes: 0.04 10*3/uL (ref 0.00–0.07)
Basophils Absolute: 0.1 10*3/uL (ref 0.0–0.1)
Basophils Relative: 1 %
Eosinophils Absolute: 0.2 10*3/uL (ref 0.0–0.5)
Eosinophils Relative: 3 %
HCT: 28.4 % — ABNORMAL LOW (ref 36.0–46.0)
Hemoglobin: 8.9 g/dL — ABNORMAL LOW (ref 12.0–15.0)
Immature Granulocytes: 1 %
Lymphocytes Relative: 28 %
Lymphs Abs: 1.9 10*3/uL (ref 0.7–4.0)
MCH: 28.2 pg (ref 26.0–34.0)
MCHC: 31.3 g/dL (ref 30.0–36.0)
MCV: 89.9 fL (ref 80.0–100.0)
Monocytes Absolute: 0.4 10*3/uL (ref 0.1–1.0)
Monocytes Relative: 6 %
Neutro Abs: 4.4 10*3/uL (ref 1.7–7.7)
Neutrophils Relative %: 61 %
Platelets: 505 10*3/uL — ABNORMAL HIGH (ref 150–400)
RBC: 3.16 MIL/uL — ABNORMAL LOW (ref 3.87–5.11)
RDW: 19.5 % — ABNORMAL HIGH (ref 11.5–15.5)
WBC: 7 10*3/uL (ref 4.0–10.5)
nRBC: 0 % (ref 0.0–0.2)

## 2021-07-11 LAB — BASIC METABOLIC PANEL
Anion gap: 8 (ref 5–15)
BUN: 10 mg/dL (ref 6–20)
CO2: 25 mmol/L (ref 22–32)
Calcium: 8.5 mg/dL — ABNORMAL LOW (ref 8.9–10.3)
Chloride: 101 mmol/L (ref 98–111)
Creatinine, Ser: 0.56 mg/dL (ref 0.44–1.00)
GFR, Estimated: >60 mL/min (ref >60)
Glucose, Bld: 97 mg/dL (ref 70–99)
Potassium: 4.1 mmol/L (ref 3.5–5.1)
Sodium: 134 mmol/L — ABNORMAL LOW (ref 135–145)

## 2021-07-11 LAB — MAGNESIUM: Magnesium: 2.1 mg/dL (ref 1.7–2.4)

## 2021-07-11 MED ORDER — BUSPIRONE HCL 5 MG PO TABS
5.0000 mg | ORAL_TABLET | Freq: Three times a day (TID) | ORAL | Status: DC
  Administered 2021-07-11 – 2021-07-25 (×45): 5 mg via ORAL
  Filled 2021-07-11 (×45): qty 1

## 2021-07-11 MED ORDER — TRAZODONE HCL 150 MG PO TABS
150.0000 mg | ORAL_TABLET | Freq: Every day | ORAL | Status: DC
  Administered 2021-07-11 – 2021-08-18 (×39): 150 mg via ORAL
  Filled 2021-07-11 (×39): qty 1

## 2021-07-11 MED ORDER — CITALOPRAM HYDROBROMIDE 20 MG PO TABS
20.0000 mg | ORAL_TABLET | Freq: Every day | ORAL | Status: DC
  Administered 2021-07-11 – 2021-08-19 (×40): 20 mg via ORAL
  Filled 2021-07-11 (×41): qty 1

## 2021-07-11 MED ORDER — HYDROCORTISONE 1 % EX CREA
TOPICAL_CREAM | CUTANEOUS | Status: DC | PRN
  Filled 2021-07-11 (×2): qty 28

## 2021-07-11 NOTE — Progress Notes (Signed)
Mobility Specialist Criteria Algorithm Info.  Mobility Team: HOB elevated: Activity: Ambulated in hall Range of motion: Active; All extremities Level of assistance: Standby assist, set-up cues, supervision of patient - no hands on Assistive device: Other (Comment) (IV Pole) Minutes sitting in chair:  Minutes stood:  Minutes ambulated: 10 minutes Distance ambulated (ft): 1150 ft Mobility response: Tolerated well Bed Position: Semi-fowlers  Patient ambulated in hallway independently using IV pole to steady. Tolerated ambulation well without complaint or incident and was left dangling EOB with all needs met.   07/11/2021 5:00 PM

## 2021-07-11 NOTE — Plan of Care (Signed)
  Problem: Education: Goal: Knowledge of General Education information will improve Description: Including pain rating scale, medication(s)/side effects and non-pharmacologic comfort measures Outcome: Progressing   Problem: Clinical Measurements: Goal: Ability to maintain clinical measurements within normal limits will improve Outcome: Progressing   Problem: Clinical Measurements: Goal: Diagnostic test results will improve Outcome: Progressing   Problem: Nutrition: Goal: Adequate nutrition will be maintained Outcome: Progressing   Problem: Coping: Goal: Level of anxiety will decrease Outcome: Progressing   Problem: Pain Managment: Goal: General experience of comfort will improve Outcome: Progressing   Problem: Safety: Goal: Ability to remain free from injury will improve Outcome: Progressing   Problem: Skin Integrity: Goal: Risk for impaired skin integrity will decrease Outcome: Progressing

## 2021-07-11 NOTE — Progress Notes (Signed)
[facility] PRO[facility]ESS [facility]  Yvette Gaines OZD:[id number] DOB: 1970-09-27 DOA: 06/29/2021 PCP: [name], DO  Status: Remains inpatient appropriate because:  Unsafe to discharge patient with history of IVDU with PICC line in place for outpatient IV therapy Patient requires a total of 6 weeks of IV antibiotics after first negative blood culture result  Barriers to discharge: Social: IVDU history so unable to discharge to complete antibiotic therapies in the outpatient environment  Clinical: repeat blood cultures of 10/29 negative Also initiated on Suboxone this admission to treat substance abuse and will need to have follow-up appointment with Suboxone clinic prior to prescribing Suboxone at discharge.  Suboxone will need to be prescribed by any attending physician that has privileges to prescribe this medication-because of requirement for Suboxone not only acutely but after discharge it is unlikely she would receive an SNF bed offer to complete her antibiotics.  In addition her antibiotics are quite expensive and this would also be a limiting factor for SNF placement.  Level of care:  Med-Surg   Code Status: Full Family Communication: Patient DVT prophylaxis: Lovenox COVID vaccination status: Unknown  HPI: 50 y.o. 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   Patient presented to [facility] on 10/05 for worsening of left shoulder swelling and pain.  Was found to have abscess of left shoulder and left calf,  I&D was done on 10/06 and culture showed MRSA.[name]  Patient however signed out [place] after the surgery. On 10/16, patient came back to [facility] complaining about new onset of chest pains and shortness of breath, CT chest showed multiple bilateral septic  emboli, echocardiogram showed new onset of tricuspid vegetation compatible with endocarditis.  Blood culture showed again MRSA.  Patient was started on vancomycin and cefepime since. During hospital stay, patient was also found to develop acute thrombocytopenia, DIC was ruled out.  Patient was admitted to [facility], and started on IV vancomycin, TEE with tricuspid leaflet vegetation.   Subjective: Continues to to report insomnia and is requesting preadmission psychotropic medications of Celexa, BuSpar and trazodone be resumed  Objective: Vitals:   07/10/21 2033 07/11/21 0521  BP: 126/82 121/83  Pulse: 74 77  Resp: 18 18  Temp: 98.6 F (37 C) 98.4 F (36.9 C)  SpO2: 98% 98%    Intake/Output Summary (Last 24 hours) at 07/11/2021 0811 Last data filed at 07/11/2021 2595 Gross per 24 hour  Intake 300 ml  Output --  Net 300 ml   Filed Weights   06/29/21 1100 07/03/21 1237  Weight: 60.3 kg 59 kg    Exam:  Constitutional: NAD, calm, comfortable- Respiratory: clear to auscultation bilaterally, Normal respiratory effort. No accessory muscle use.  Room air Cardiovascular: Regular rate and rhythm, no murmurs / rubs / gallops. No extremity edema.  S1-S2 Abdomen: no tenderness. Bowel sounds positive. LBM 1/31 Neurologic: CN 2-12 grossly intact. Sensation intact, DTR normal. Strength 5/5 x all 4 extremities.  Psychiatric: Normal judgment and insight. Alert and oriented x 3. Normal mood.    Assessment/Plan: Acute problems:   MRSA bacteremia Active Problems:   Severe opioid use disorder (HCC)   Endocarditis   Septic pulmonary embolism (HCC)   Chronic viral hepatitis C (HCC)   Shoulder abscess   Calf abscess   Odynophagia   Protein-calorie malnutrition, severe  MRSA bacteremia/MRSA TV endocarditis/septic emboli/recent right shoulder and right calf abscess /status post I&D 10/6 (at [facility])  -TEE consistent with tricuspid valve endocarditis.  Seen by CT surgery.   Decided against any need for surgery.  If persistent positive cultures despite changing antibiotics, may need angio VAC. -Not a candidate to go home with PICC line, last drug use a week ago.   -Vancomycin discontinued, narrowed to Teflaro and daptomycin by ID. -Last blood cultures obtained on 10/29 and currently remain negative-discussed with ID and once blood cultures have finalized as negative recommendation will then be given as to when appropriate to insert PICC line  IVDU with narcotics -Started on Suboxone this admission which will need to be continued after discharge -Once date of last dose IV antibiotics determined will need to have current Suboxone clinic reach out to patient regarding assessment so follow-up appointment can be scheduled -Once ready for discharge I will ask Dr. Loleta Books to prescribe Suboxone -Unable to place PICC line until blood cultures are negative and after with ID  Depression and insomnia Patient reports was on Celexa, BuSpar and trazodone at bedtime and was able to recall all of her doses.  These medications have been reordered   Thrombocytopenia:  Resolved.   Recent right shoulder and calf abscess -Source of bacteremia -Status post I&D on 10/06 at [facility] -At outside hospital revealed no infectious involvement of AC joint    Severe protein calorie malnutrition Nutrition Problem: Severe Malnutrition Etiology: chronic illness and poor intake prior to admission secondary to IVDU Body mass index is 23.78 kg/m.  Signs/Symptoms: severe fat depletion, severe muscle depletion Interventions: Ensure Enlive (each supplement provides 350kcal and 20 grams of protein), Magic cup, MVI    Mild intermittent asthma/current tobacco abuse -Currently asymptomatic -Continue prn bronchodilators -Continue nicotine patch   Normocytic anemia -Anemia panel reveals low iron which is consistent with chronic anemia for nutrition -Current hemoglobin 7.8 -Transfuse for  hemoglobin less than 7.      +HCV AB -ID recommended treatment after discharge.   Blood culture data: -Blood cultures positive for MRSA on 10/21, 10/22.   -Blood cultures 1/2 positive from 10/24. -Blood cultures 1/2 positive from 10/25. -Blood cultures 2 out of 2 positive from 10/27. -Blood cultures no growth so far from 10/29.   Data Reviewed: Basic Metabolic Panel: Recent Labs  Lab 07/11/21 0150  NA 134*  K 4.1  CL 101  CO2 25  GLUCOSE 97  BUN 10  CREATININE 0.56  CALCIUM 8.5*  MG 2.1   CBC: Recent Labs  Lab 07/11/21 0150  WBC 7.0  NEUTROABS 4.4  HGB 8.9*  HCT 28.4*  MCV 89.9  PLT 505*   Cardiac Enzymes: Recent Labs  Lab 07/06/21 1318  CKTOTAL 16*   BNP (last 3 results) Recent Labs    06/29/21 1442  BNP 113.8*    ProBNP (last 3 results) No results for input(s): PROBNP in the last 8760 hours.  CBG: No results for input(s): GLUCAP in the last 168 hours.   Recent Results (from the past 240 hour(s))  Culture, blood (routine x 2)     Status: Abnormal (Preliminary result)   Collection Time: 07/02/21  8:50 AM   Specimen: BLOOD  Result Value Ref Range Status   Specimen Description BLOOD SITE NOT SPECIFIED  Final   Special Requests IN PEDIATRIC BOTTLE Blood Culture adequate volume  Final   Culture  Setup Time   Final    GRAM POSITIVE COCCI AEROBIC BOTTLE ONLY CRITICAL VALUE  NOTED.  VALUE IS CONSISTENT WITH PREVIOUSLY REPORTED AND CALLED VALUE.    Culture (A)  Final    STAPHYLOCOCCUS AUREUS Sent to [place] for further susceptibility testing. Performed at [facility], [place] [address] [place], [place] [address]    Report Status PENDING  Incomplete  Culture, blood (routine x 2)     Status: None   Collection Time: 07/02/21 11:50 AM   Specimen: BLOOD RIGHT FOREARM  Result Value Ref Range Status   Specimen Description BLOOD RIGHT FOREARM  Final   Special Requests   Final    BOTTLES DRAWN AEROBIC AND ANAEROBIC Blood Culture adequate volume    Culture   Final    NO GROWTH 5 DAYS Performed at [facility], [place] [address] [place], [place] [address]    Report Status 07/07/2021 FINAL  Final  Culture, blood (routine x 2)     Status: Abnormal   Collection Time: 07/03/21  8:06 PM   Specimen: BLOOD  Result Value Ref Range Status   Specimen Description BLOOD RIGHT ARM  Final   Special Requests   Final    BOTTLES DRAWN AEROBIC AND ANAEROBIC Blood Culture adequate volume   Culture  Setup Time   Final    GRAM POSITIVE COCCI IN CLUSTERS ANAEROBIC BOTTLE ONLY CRITICAL VALUE NOTED.  VALUE IS CONSISTENT WITH PREVIOUSLY REPORTED AND CALLED VALUE.    Culture (A)  Final    STAPHYLOCOCCUS AUREUS SUSCEPTIBILITIES PERFORMED ON PREVIOUS CULTURE WITHIN THE LAST 5 DAYS. Performed at [facility], [place] [address] [place], [place] [address]    Report Status 07/07/2021 FINAL  Final  Culture, blood (routine x 2)     Status: Abnormal   Collection Time: 07/03/21  8:06 PM   Specimen: BLOOD  Result Value Ref Range Status   Specimen Description BLOOD LEFT ARM  Final   Special Requests   Final    BOTTLES DRAWN AEROBIC AND ANAEROBIC Blood Culture adequate volume   Culture  Setup Time   Final    GRAM POSITIVE COCCI IN CLUSTERS ANAEROBIC BOTTLE ONLY CRITICAL VALUE NOTED.  VALUE IS CONSISTENT WITH PREVIOUSLY REPORTED AND CALLED VALUE. Performed at [facility], [place] [address] [place],  [address]    Culture METHICILLIN RESISTANT STAPHYLOCOCCUS AUREUS (A)  Final   Report Status 07/09/2021 FINAL  Final   Organism ID, Bacteria METHICILLIN RESISTANT STAPHYLOCOCCUS AUREUS  Final      Susceptibility   Methicillin resistant staphylococcus aureus - MIC*    CIPROFLOXACIN <=0.5 SENSITIVE Sensitive     ERYTHROMYCIN >=8 RESISTANT Resistant     GENTAMICIN <=0.5 SENSITIVE Sensitive     OXACILLIN >=4 RESISTANT Resistant     TETRACYCLINE <=1 SENSITIVE Sensitive     VANCOMYCIN 1 SENSITIVE Sensitive     TRIMETH/SULFA <=10 SENSITIVE  Sensitive     CLINDAMYCIN <=0.25 SENSITIVE Sensitive     RIFAMPIN <=0.5 SENSITIVE Sensitive     Inducible Clindamycin NEGATIVE Sensitive     * METHICILLIN RESISTANT STAPHYLOCOCCUS AUREUS  Culture, blood (Routine X 2) w Reflex to ID Panel     Status: Abnormal   Collection Time: 07/05/21  7:00 PM   Specimen: BLOOD RIGHT ARM  Result Value Ref Range Status   Specimen Description BLOOD RIGHT ARM  Final   Special Requests   Final    BOTTLES DRAWN AEROBIC AND ANAEROBIC Blood Culture adequate volume   Culture  Setup Time   Final    GRAM POSITIVE COCCI IN BOTH  AEROBIC AND ANAEROBIC BOTTLES CRITICAL VALUE NOTED.  VALUE IS CONSISTENT WITH PREVIOUSLY REPORTED AND CALLED VALUE.    Culture (A)  Final    STAPHYLOCOCCUS AUREUS SUSCEPTIBILITIES PERFORMED ON PREVIOUS CULTURE WITHIN THE LAST 5 DAYS. Performed at [facility], [place] [address] [place], [place] [address]    Report Status 07/09/2021 FINAL  Final  Culture, blood (Routine X 2) w Reflex to ID Panel     Status: Abnormal   Collection Time: 07/05/21  7:06 PM   Specimen: BLOOD LEFT WRIST  Result Value Ref Range Status   Specimen Description BLOOD LEFT WRIST  Final   Special Requests   Final    BOTTLES DRAWN AEROBIC AND ANAEROBIC Blood Culture results may not be optimal due to an inadequate volume of blood received in culture bottles   Culture  Setup Time   Final    GRAM POSITIVE COCCI IN BOTH AEROBIC AND ANAEROBIC BOTTLES CRITICAL VALUE NOTED.  VALUE IS CONSISTENT WITH PREVIOUSLY REPORTED AND CALLED VALUE.    Culture (A)  Final    STAPHYLOCOCCUS AUREUS SUSCEPTIBILITIES PERFORMED ON PREVIOUS CULTURE WITHIN THE LAST 5 DAYS. Performed at [facility], [place] [address], [place], [place] [address]    Report Status 07/08/2021 FINAL  Final  Culture, blood (Routine X 2) w Reflex to ID Panel     Status: None (Preliminary result)   Collection Time: 07/07/21  2:38 PM   Specimen: BLOOD RIGHT ARM  Result Value Ref Range Status   Specimen  Description BLOOD RIGHT ARM  Final   Special Requests   Final    BOTTLES DRAWN AEROBIC ONLY Blood Culture results may not be optimal due to an inadequate volume of blood received in culture bottles   Culture   Final    NO GROWTH 4 DAYS Performed at [facility], [place] [address] [place], [place] [address]    Report Status PENDING  Incomplete  Culture, blood (Routine X 2) w Reflex to ID Panel     Status: None (Preliminary result)   Collection Time: 07/07/21  2:39 PM   Specimen: BLOOD RIGHT HAND  Result Value Ref Range Status   Specimen Description BLOOD RIGHT HAND  Final   Special Requests   Final    BOTTLES DRAWN AEROBIC ONLY Blood Culture results may not be optimal due to an inadequate volume of blood received in culture bottles   Culture   Final    NO GROWTH 4 DAYS Performed at [facility], [place] [address], [place], [place] [address]    Report Status PENDING  Incomplete     Studies: No results found.  Scheduled Meds:  buprenorphine-naloxone  1.5 tablet Sublingual Daily   enoxaparin (LOVENOX) injection  40 mg Subcutaneous Q24H   famotidine  20 mg Oral Daily   feeding supplement  237 mL Oral TID BM   magic mouthwash  5 mL Oral QID   multivitamin with minerals  1 tablet Oral Daily   nicotine  21 mg Transdermal Daily   Continuous Infusions:  ceFTAROline (TEFLARO) IV 600 mg (07/11/21 3267)   DAPTOmycin (CUBICIN)  IV Stopped (07/10/21 2101)    Principal Problem:   MRSA bacteremia Active Problems:   Severe opioid use disorder ([place])   Endocarditis   Septic pulmonary embolism (HCC)   Chronic viral hepatitis C ([place])   Shoulder abscess   Calf abscess   Odynophagia   Protein-calorie malnutrition, severe   Consultants: Infectious disease Cardiothoracic surgery  Procedures:  TTE TEE  Antibiotics: Vancomycin 10/21 through 10/28 Valacyclovir 10/23 through 10/29 Daptomycin 10/28 >> Ceftaroline 10/28 >>   Time spent: 35 minutes    Erin Hearing  ANP  [facility] 7 am - 330 pm/M-F for direct patient care and secure chat Please refer to Amion for contact info 12  days

## 2021-07-11 NOTE — Progress Notes (Signed)
Subjective:  No new complaints   Antibiotics:  Anti-infectives (From admission, onward)    Start     Dose/Rate Route Frequency Ordered Stop   07/06/21 1400  ceftaroline (TEFLARO) 600 mg in sodium chloride 0.9 % 100 mL IVPB        600 mg 100 mL/hr over 60 Minutes Intravenous Every 8 hours 07/06/21 1033     07/06/21 1330  DAPTOmycin (CUBICIN) 500 mg in sodium chloride 0.9 % IVPB        500 mg 120 mL/hr over 30 Minutes Intravenous Daily 07/06/21 1233     07/01/21 2200  vancomycin (VANCOREADY) IVPB 750 mg/150 mL  Status:  Discontinued        750 mg 150 mL/hr over 60 Minutes Intravenous Every 12 hours 07/01/21 1314 07/06/21 1033   07/01/21 1400  valACYclovir (VALTREX) tablet 1,000 mg        1,000 mg Oral 2 times daily 07/01/21 1306 07/08/21 0959   06/30/21 0200  vancomycin (VANCOREADY) IVPB 750 mg/150 mL  Status:  Discontinued        750 mg 150 mL/hr over 60 Minutes Intravenous Every 12 hours 06/29/21 1253 06/29/21 1254   06/29/21 1700  vancomycin (VANCOREADY) IVPB 750 mg/150 mL  Status:  Discontinued        750 mg 150 mL/hr over 60 Minutes Intravenous Every 12 hours 06/29/21 1308 07/01/21 1301   06/29/21 1615  rifampin (RIFADIN) capsule 300 mg  Status:  Discontinued        300 mg Oral Daily 06/29/21 1606 06/29/21 1607   06/29/21 1130  vancomycin (VANCOREADY) IVPB 1250 mg/250 mL  Status:  Discontinued        1,250 mg 166.7 mL/hr over 90 Minutes Intravenous  Once 06/29/21 1124 06/29/21 1254   06/29/21 1130  ceFEPIme (MAXIPIME) 2 g in sodium chloride 0.9 % 100 mL IVPB  Status:  Discontinued        2 g 200 mL/hr over 30 Minutes Intravenous  Once 06/29/21 1124 06/29/21 1251       Medications: Scheduled Meds:  buprenorphine-naloxone  1.5 tablet Sublingual Daily   busPIRone  5 mg Oral TID   citalopram  20 mg Oral Daily   enoxaparin (LOVENOX) injection  40 mg Subcutaneous Q24H   famotidine  20 mg Oral Daily   feeding supplement  237 mL Oral TID BM   multivitamin with  minerals  1 tablet Oral Daily   nicotine  21 mg Transdermal Daily   traZODone  150 mg Oral QHS   Continuous Infusions:  ceFTAROline (TEFLARO) IV 600 mg (07/11/21 5329)   DAPTOmycin (CUBICIN)  IV Stopped (07/10/21 2101)   PRN Meds:.acetaminophen **OR** acetaminophen, albuterol, ALPRAZolam, influenza vac split quadrivalent PF, ondansetron **OR** ondansetron (ZOFRAN) IV    Objective: Weight change:   Intake/Output Summary (Last 24 hours) at 07/11/2021 1129 Last data filed at 07/11/2021 0700 Gross per 24 hour  Intake 575 ml  Output --  Net 575 ml    Blood pressure 116/79, pulse 74, temperature 98.3 F (36.8 C), temperature source Oral, resp. rate 16, height 5\' 2"  (1.575 m), weight 59 kg, SpO2 100 %. Temp:  [98.3 F (36.8 C)-98.6 F (37 C)] 98.3 F (36.8 C) (11/02 0845) Pulse Rate:  [74-77] 74 (11/02 0845) Resp:  [16-18] 16 (11/02 0845) BP: (116-126)/(79-83) 116/79 (11/02 0845) SpO2:  [98 %-100 %] 100 % (11/02 0845)  Physical Exam: Physical Exam Constitutional:      General: She is  not in acute distress.    Appearance: She is well-developed. She is not diaphoretic.  HENT:     Head: Normocephalic and atraumatic.     Right Ear: External ear normal.     Left Ear: External ear normal.     Mouth/Throat:     Pharynx: No oropharyngeal exudate.  Eyes:     General: No scleral icterus.    Extraocular Movements: Extraocular movements intact.     Conjunctiva/sclera: Conjunctivae normal.     Pupils: Pupils are equal, round, and reactive to light.  Cardiovascular:     Rate and Rhythm: Normal rate and regular rhythm.  Pulmonary:     Effort: Pulmonary effort is normal. No respiratory distress.     Breath sounds: No wheezing or rales.  Abdominal:     General: There is no distension.     Palpations: Abdomen is soft.  Musculoskeletal:        General: No tenderness. Normal range of motion.  Lymphadenopathy:     Cervical: No cervical adenopathy.  Skin:    General: Skin is warm and  dry.     Coloration: Skin is not pale.     Findings: No erythema or rash.  Neurological:     General: No focal deficit present.     Mental Status: She is alert and oriented to person, place, and time.     Motor: No abnormal muscle tone.     Coordination: Coordination normal.  Psychiatric:        Mood and Affect: Mood normal.        Behavior: Behavior normal.        Thought Content: Thought content normal.        Judgment: Judgment normal.     CBC:    BMET Recent Labs    07/11/21 0150  NA 134*  K 4.1  CL 101  CO2 25  GLUCOSE 97  BUN 10  CREATININE 0.56  CALCIUM 8.5*      Liver Panel  No results for input(s): PROT, ALBUMIN, AST, ALT, ALKPHOS, BILITOT, BILIDIR, IBILI in the last 72 hours.     Sedimentation Rate No results for input(s): ESRSEDRATE in the last 72 hours.  C-Reactive Protein No results for input(s): CRP in the last 72 hours.  Micro Results: Recent Results (from the past 720 hour(s))  Blood culture (routine x 2)     Status: Abnormal   Collection Time: 06/29/21  9:54 AM   Specimen: BLOOD RIGHT HAND  Result Value Ref Range Status   Specimen Description BLOOD RIGHT HAND  Final   Special Requests   Final    BOTTLES DRAWN AEROBIC ONLY Blood Culture results may not be optimal due to an inadequate volume of blood received in culture bottles   Culture  Setup Time   Final    GRAM POSITIVE COCCI AEROBIC BOTTLE ONLY CRITICAL RESULT CALLED TO, READ BACK BY AND VERIFIED WITH: G,BARR PHARMD @1338  06/30/21 EB Performed at [facility], [place] [address] [place], [place] [address]    Culture METHICILLIN RESISTANT STAPHYLOCOCCUS AUREUS (A)  Final   Report Status 07/02/2021 FINAL  Final   Organism ID, Bacteria METHICILLIN RESISTANT STAPHYLOCOCCUS AUREUS  Final      Susceptibility   Methicillin resistant staphylococcus aureus - MIC*    CIPROFLOXACIN <=0.5 SENSITIVE Sensitive     ERYTHROMYCIN >=8 RESISTANT Resistant     GENTAMICIN <=0.5 SENSITIVE  Sensitive     OXACILLIN >=4 RESISTANT Resistant     TETRACYCLINE <=  1 SENSITIVE Sensitive     VANCOMYCIN 1 SENSITIVE Sensitive     TRIMETH/SULFA <=10 SENSITIVE Sensitive     CLINDAMYCIN <=0.25 SENSITIVE Sensitive     RIFAMPIN <=0.5 SENSITIVE Sensitive     Inducible Clindamycin NEGATIVE Sensitive     * METHICILLIN RESISTANT STAPHYLOCOCCUS AUREUS  Blood Culture ID Panel (Reflexed)     Status: Abnormal   Collection Time: 06/29/21  9:54 AM  Result Value Ref Range Status   Enterococcus faecalis NOT DETECTED NOT DETECTED Final   Enterococcus Faecium NOT DETECTED NOT DETECTED Final   Listeria monocytogenes NOT DETECTED NOT DETECTED Final   Staphylococcus species DETECTED (A) NOT DETECTED Final    Comment: CRITICAL RESULT CALLED TO, READ BACK BY AND VERIFIED WITH: G,BARR PHARMD @1338  06/30/21 EB    Staphylococcus aureus (BCID) DETECTED (A) NOT DETECTED Final    Comment: Methicillin (oxacillin)-resistant Staphylococcus aureus (MRSA). MRSA is predictably resistant to beta-lactam antibiotics (except ceftaroline). Preferred therapy is vancomycin unless clinically contraindicated. Patient requires contact precautions if  hospitalized. CRITICAL RESULT CALLED TO, READ BACK BY AND VERIFIED WITH: G,BARR PHARMD @1338  06/30/21 EB    Staphylococcus epidermidis NOT DETECTED NOT DETECTED Final   Staphylococcus lugdunensis NOT DETECTED NOT DETECTED Final   Streptococcus species NOT DETECTED NOT DETECTED Final   Streptococcus agalactiae NOT DETECTED NOT DETECTED Final   Streptococcus pneumoniae NOT DETECTED NOT DETECTED Final   Streptococcus pyogenes NOT DETECTED NOT DETECTED Final   A.calcoaceticus-baumannii NOT DETECTED NOT DETECTED Final   Bacteroides fragilis NOT DETECTED NOT DETECTED Final   Enterobacterales NOT DETECTED NOT DETECTED Final   Enterobacter cloacae complex NOT DETECTED NOT DETECTED Final   Escherichia coli NOT DETECTED NOT DETECTED Final   Klebsiella aerogenes NOT DETECTED NOT DETECTED  Final   Klebsiella oxytoca NOT DETECTED NOT DETECTED Final   Klebsiella pneumoniae NOT DETECTED NOT DETECTED Final   Proteus species NOT DETECTED NOT DETECTED Final   Salmonella species NOT DETECTED NOT DETECTED Final   Serratia marcescens NOT DETECTED NOT DETECTED Final   Haemophilus influenzae NOT DETECTED NOT DETECTED Final   Neisseria meningitidis NOT DETECTED NOT DETECTED Final   Pseudomonas aeruginosa NOT DETECTED NOT DETECTED Final   Stenotrophomonas maltophilia NOT DETECTED NOT DETECTED Final   Candida albicans NOT DETECTED NOT DETECTED Final   Candida auris NOT DETECTED NOT DETECTED Final   Candida glabrata NOT DETECTED NOT DETECTED Final   Candida krusei NOT DETECTED NOT DETECTED Final   Candida parapsilosis NOT DETECTED NOT DETECTED Final   Candida tropicalis NOT DETECTED NOT DETECTED Final   Cryptococcus neoformans/gattii NOT DETECTED NOT DETECTED Final   Meth resistant mecA/C and MREJ DETECTED (A) NOT DETECTED Final    Comment: CRITICAL RESULT CALLED TO, READ BACK BY AND VERIFIED WITH: G,BARR PHARMD @1338  06/30/21 EB Performed at [facility], [address], [place], [place] [address]   Resp Panel by RT-PCR (Flu A&B, Covid) Nasopharyngeal Swab     Status: None   Collection Time: 06/29/21  9:55 AM   Specimen: Nasopharyngeal Swab; Nasopharyngeal(NP) swabs in vial transport medium  Result Value Ref Range Status   SARS Coronavirus 2 by RT PCR NEGATIVE NEGATIVE Final    Comment: (NOTE) SARS-CoV-2 target nucleic acids are NOT DETECTED.  The SARS-CoV-2 RNA is generally detectable in upper respiratory specimens during the acute phase of infection. The lowest concentration of SARS-CoV-2 viral copies this assay can detect is 138 copies/mL. A negative result does not preclude SARS-Cov-2 infection and should not be used as the sole  basis for treatment or other patient management decisions. A negative result may occur with  improper specimen collection/handling, submission  of specimen other than nasopharyngeal swab, presence of viral mutation(s) within the areas targeted by this assay, and inadequate number of viral copies(<138 copies/mL). A negative result must be combined with clinical observations, patient history, and epidemiological information. The expected result is Negative.  Fact Sheet for Patients:  EntrepreneurPulse.com.au  Fact Sheet for Healthcare Providers:  IncredibleEmployment.be  This test is no t yet approved or cleared by the [place] FDA and  has been authorized for detection and/or diagnosis of SARS-CoV-2 by FDA under an Emergency Use Authorization (EUA). This EUA will remain  in effect (meaning this test can be used) for the duration of the COVID-19 declaration under Section 564(b)(1) of the Act, 21 U.S.C.section 360bbb-3(b)(1), unless the authorization is terminated  or revoked sooner.       Influenza A by PCR NEGATIVE NEGATIVE Final   Influenza B by PCR NEGATIVE NEGATIVE Final    Comment: (NOTE) The Xpert Xpress SARS-CoV-2/FLU/RSV plus assay is intended as an aid in the diagnosis of influenza from Nasopharyngeal swab specimens and should not be used as a sole basis for treatment. Nasal washings and aspirates are unacceptable for Xpert Xpress SARS-CoV-2/FLU/RSV testing.  Fact Sheet for Patients: EntrepreneurPulse.com.au  Fact Sheet for Healthcare Providers: IncredibleEmployment.be  This test is not yet approved or cleared by the [place] FDA and has been authorized for detection and/or diagnosis of SARS-CoV-2 by FDA under an Emergency Use Authorization (EUA). This EUA will remain in effect (meaning this test can be used) for the duration of the COVID-19 declaration under Section 564(b)(1) of the Act, 21 U.S.C. section 360bbb-3(b)(1), unless the authorization is terminated or revoked.  Performed at [facility], [place] [address]  [place], [place] [address]   Blood culture (routine x 2)     Status: Abnormal   Collection Time: 06/29/21  3:15 PM   Specimen: BLOOD  Result Value Ref Range Status   Specimen Description BLOOD BLOOD LEFT FOREARM  Final   Special Requests   Final    BOTTLES DRAWN AEROBIC AND ANAEROBIC Blood Culture adequate volume   Culture  Setup Time   Final    GRAM POSITIVE COCCI ANAEROBIC BOTTLE ONLY CRITICAL VALUE NOTED.  VALUE IS CONSISTENT WITH PREVIOUSLY REPORTED AND CALLED VALUE.    Culture (A)  Final    STAPHYLOCOCCUS AUREUS SUSCEPTIBILITIES PERFORMED ON PREVIOUS CULTURE WITHIN THE LAST 5 DAYS. Performed at [facility], [place] [address], [place], [place] [address]    Report Status 07/03/2021 FINAL  Final  Culture, blood (routine x 2)     Status: Abnormal   Collection Time: 06/30/21  6:50 PM   Specimen: BLOOD  Result Value Ref Range Status   Specimen Description BLOOD LEFT ANTECUBITAL  Final   Special Requests   Final    BOTTLES DRAWN AEROBIC ONLY Blood Culture results may not be optimal due to an inadequate volume of blood received in culture bottles   Culture  Setup Time   Final    GRAM POSITIVE COCCI IN CLUSTERS AEROBIC BOTTLE ONLY CRITICAL VALUE NOTED.  VALUE IS CONSISTENT WITH PREVIOUSLY REPORTED AND CALLED VALUE.    Culture (A)  Final    STAPHYLOCOCCUS AUREUS SUSCEPTIBILITIES PERFORMED ON PREVIOUS CULTURE WITHIN THE LAST 5 DAYS. Performed at [facility], [place] [address], [place], [place] [address]    Report Status 07/02/2021 FINAL  Final  Culture,  blood (routine x 2)     Status: Abnormal   Collection Time: 06/30/21  6:55 PM   Specimen: BLOOD  Result Value Ref Range Status   Specimen Description BLOOD LEFT ANTECUBITAL  Final   Special Requests   Final    BOTTLES DRAWN AEROBIC ONLY Blood Culture results may not be optimal due to an inadequate volume of blood received in culture bottles   Culture  Setup Time   Final    GRAM POSITIVE COCCI IN CLUSTERS AEROBIC BOTTLE  ONLY CRITICAL VALUE NOTED.  VALUE IS CONSISTENT WITH PREVIOUSLY REPORTED AND CALLED VALUE.    Culture (A)  Final    STAPHYLOCOCCUS AUREUS SUSCEPTIBILITIES PERFORMED ON PREVIOUS CULTURE WITHIN THE LAST 5 DAYS. Performed at [facility], [place] [address], [place], [place] [address]    Report Status 07/02/2021 FINAL  Final  Culture, blood (routine x 2)     Status: Abnormal (Preliminary result)   Collection Time: 07/02/21  8:50 AM   Specimen: BLOOD  Result Value Ref Range Status   Specimen Description BLOOD SITE NOT SPECIFIED  Final   Special Requests IN PEDIATRIC BOTTLE Blood Culture adequate volume  Final   Culture  Setup Time   Final    GRAM POSITIVE COCCI AEROBIC BOTTLE ONLY CRITICAL VALUE NOTED.  VALUE IS CONSISTENT WITH PREVIOUSLY REPORTED AND CALLED VALUE.    Culture (A)  Final    STAPHYLOCOCCUS AUREUS Sent to [place] for further susceptibility testing. Performed at [facility], [place] [address], [place], [place] [address]    Report Status PENDING  Incomplete  Culture, blood (routine x 2)     Status: None   Collection Time: 07/02/21 11:50 AM   Specimen: BLOOD RIGHT FOREARM  Result Value Ref Range Status   Specimen Description BLOOD RIGHT FOREARM  Final   Special Requests   Final    BOTTLES DRAWN AEROBIC AND ANAEROBIC Blood Culture adequate volume   Culture   Final    NO GROWTH 5 DAYS Performed at [facility], [place] [address], [place], [place] [address]    Report Status 07/07/2021 FINAL  Final  Culture, blood (routine x 2)     Status: Abnormal   Collection Time: 07/03/21  8:06 PM   Specimen: BLOOD  Result Value Ref Range Status   Specimen Description BLOOD RIGHT ARM  Final   Special Requests   Final    BOTTLES DRAWN AEROBIC AND ANAEROBIC Blood Culture adequate volume   Culture  Setup Time   Final    GRAM POSITIVE COCCI IN CLUSTERS ANAEROBIC BOTTLE ONLY CRITICAL VALUE NOTED.  VALUE IS CONSISTENT WITH PREVIOUSLY REPORTED AND CALLED VALUE.    Culture  (A)  Final    STAPHYLOCOCCUS AUREUS SUSCEPTIBILITIES PERFORMED ON PREVIOUS CULTURE WITHIN THE LAST 5 DAYS. Performed at [facility], [place] [address], [place], [place] [address]    Report Status 07/07/2021 FINAL  Final  Culture, blood (routine x 2)     Status: Abnormal   Collection Time: 07/03/21  8:06 PM   Specimen: BLOOD  Result Value Ref Range Status   Specimen Description BLOOD LEFT ARM  Final   Special Requests   Final    BOTTLES DRAWN AEROBIC AND ANAEROBIC Blood Culture adequate volume   Culture  Setup Time   Final    GRAM POSITIVE COCCI IN CLUSTERS ANAEROBIC BOTTLE ONLY CRITICAL VALUE NOTED.  VALUE IS CONSISTENT WITH PREVIOUSLY REPORTED AND CALLED VALUE. Performed at [facility],  [address] [place], [place] [address]    Culture METHICILLIN RESISTANT STAPHYLOCOCCUS AUREUS (A)  Final   Report Status 07/09/2021 FINAL  Final   Organism ID, Bacteria METHICILLIN RESISTANT STAPHYLOCOCCUS AUREUS  Final      Susceptibility   Methicillin resistant staphylococcus aureus - MIC*    CIPROFLOXACIN <=0.5 SENSITIVE Sensitive     ERYTHROMYCIN >=8 RESISTANT Resistant     GENTAMICIN <=0.5 SENSITIVE Sensitive     OXACILLIN >=4 RESISTANT Resistant     TETRACYCLINE <=1 SENSITIVE Sensitive     VANCOMYCIN 1 SENSITIVE Sensitive     TRIMETH/SULFA <=10 SENSITIVE Sensitive     CLINDAMYCIN <=0.25 SENSITIVE Sensitive     RIFAMPIN <=0.5 SENSITIVE Sensitive     Inducible Clindamycin NEGATIVE Sensitive     * METHICILLIN RESISTANT STAPHYLOCOCCUS AUREUS  Culture, blood (Routine X 2) w Reflex to ID Panel     Status: Abnormal   Collection Time: 07/05/21  7:00 PM   Specimen: BLOOD RIGHT ARM  Result Value Ref Range Status   Specimen Description BLOOD RIGHT ARM  Final   Special Requests   Final    BOTTLES DRAWN AEROBIC AND ANAEROBIC Blood Culture adequate volume   Culture  Setup Time   Final    GRAM POSITIVE COCCI IN BOTH AEROBIC AND ANAEROBIC BOTTLES CRITICAL VALUE NOTED.  VALUE IS  CONSISTENT WITH PREVIOUSLY REPORTED AND CALLED VALUE.    Culture (A)  Final    STAPHYLOCOCCUS AUREUS SUSCEPTIBILITIES PERFORMED ON PREVIOUS CULTURE WITHIN THE LAST 5 DAYS. Performed at [facility], [place] [address], [place], [place] [address]    Report Status 07/09/2021 FINAL  Final  Culture, blood (Routine X 2) w Reflex to ID Panel     Status: Abnormal   Collection Time: 07/05/21  7:06 PM   Specimen: BLOOD LEFT WRIST  Result Value Ref Range Status   Specimen Description BLOOD LEFT WRIST  Final   Special Requests   Final    BOTTLES DRAWN AEROBIC AND ANAEROBIC Blood Culture results may not be optimal due to an inadequate volume of blood received in culture bottles   Culture  Setup Time   Final    GRAM POSITIVE COCCI IN BOTH AEROBIC AND ANAEROBIC BOTTLES CRITICAL VALUE NOTED.  VALUE IS CONSISTENT WITH PREVIOUSLY REPORTED AND CALLED VALUE.    Culture (A)  Final    STAPHYLOCOCCUS AUREUS SUSCEPTIBILITIES PERFORMED ON PREVIOUS CULTURE WITHIN THE LAST 5 DAYS. Performed at [facility], [place] [address], [place], [place] [address]    Report Status 07/08/2021 FINAL  Final  Culture, blood (Routine X 2) w Reflex to ID Panel     Status: None (Preliminary result)   Collection Time: 07/07/21  2:38 PM   Specimen: BLOOD RIGHT ARM  Result Value Ref Range Status   Specimen Description BLOOD RIGHT ARM  Final   Special Requests   Final    BOTTLES DRAWN AEROBIC ONLY Blood Culture results may not be optimal due to an inadequate volume of blood received in culture bottles   Culture   Final    NO GROWTH 4 DAYS Performed at [facility], [place] [address] [place], [place] [address]    Report Status PENDING  Incomplete  Culture, blood (Routine X 2) w Reflex to ID Panel     Status: None (Preliminary result)   Collection Time: 07/07/21  2:39 PM   Specimen: BLOOD RIGHT HAND  Result Value Ref Range Status   Specimen Description BLOOD RIGHT HAND  Final   Special Requests   Final    BOTTLES  DRAWN AEROBIC ONLY Blood Culture results may not be optimal due to an inadequate volume of blood received in culture bottles   Culture   Final    NO GROWTH 4 DAYS Performed at [facility], [place] [address] [place], [place] [address]    Report Status PENDING  Incomplete    Studies/Results: No results found.    Assessment/Plan:  INTERVAL HISTORY:   Blood cultures no growth to date   Principal Problem:   MRSA bacteremia Active Problems:   Severe opioid use disorder (HCC)   Endocarditis   Septic pulmonary embolism (HCC)   Chronic viral hepatitis C ([place])   Shoulder abscess   Calf abscess   Odynophagia   Protein-calorie malnutrition, severe    Yvette Gaines is a 50 y.o. 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 HCV: can treat in clinic  4.  Consideration for HIV preexposure prophylaxis: Consider oral Truvada versus long-acting Apretude  5 IVDU: she is going to be going to treatment clinic.        LOS: 12 days   Alcide Evener 07/11/2021, 11:29 AM

## 2021-07-11 NOTE — TOC Progression Note (Signed)
Transition of Care [facility]) - Progression Note    Patient Details  Name: Yvette Gaines MRN: [id number] Date of Birth: 27-Dec-1970  Transition of Care [facility]) CM/SW [place], RN Phone Number: 07/11/2021, 9:54 AM  Clinical Narrative:    Case management met with the patient at the bedside to discuss transitions of care.  The patient is currently receiving IV antibiotics for the next 6 weeks and Suboxone.  The patient is unable to transition to a SNF facility at this time due to her need for suboxone.  [facility] offers SNF benefits but the area facilities are incapable of offering Suboxone prescription regimen at the facilities for care.  Erin Hearing, NP was made aware of barriers to SNF placement.  The patient requested assistance with [place] Medicaid application and PCP clinic follow up.  I have the patient printed applications for PCP clinic in [place] along with [facility] Medicaid application and I will follow up this afternoon and send completed applications for the patient.  The patient states that she currently lives in an apartment with her ex-husband - who has been supporting the patient financially at this time.  The patient currently receives food stamps as well.  The patient does not drive but her husband will be able to provide transportation for the patient to appointments.  CM and MSW will continue to follow the patient for discharge planning needs.  The patient will remain hospitalized for IV antibiotics at this time since SNF placement is not an option due to the Suboxone regimen.   Expected Discharge Plan: Home/Self Care Barriers to Discharge: Other (must enter comment) (Recent history of IVDU - needs IV antibiotics for next 6 weeks, tx with suboxone)  Expected Discharge Plan and Services Expected Discharge Plan: Home/Self Care In-house Referral: PCP / [facility], Financial Counselor Discharge Planning Services: CM Consult   Living arrangements for the past 2  months: Apartment                                       Social Determinants of Health (SDOH) Interventions    Readmission Risk Interventions No flowsheet data found.

## 2021-07-12 ENCOUNTER — Inpatient Hospital Stay: Payer: Self-pay

## 2021-07-12 DIAGNOSIS — R7881 Bacteremia: Secondary | ICD-10-CM | POA: Diagnosis not present

## 2021-07-12 DIAGNOSIS — B9562 Methicillin resistant Staphylococcus aureus infection as the cause of diseases classified elsewhere: Secondary | ICD-10-CM | POA: Diagnosis not present

## 2021-07-12 LAB — CULTURE, BLOOD (ROUTINE X 2)
Culture: NO GROWTH
Culture: NO GROWTH

## 2021-07-12 MED ORDER — SODIUM CHLORIDE 0.9% FLUSH
10.0000 mL | Freq: Two times a day (BID) | INTRAVENOUS | Status: DC
  Administered 2021-07-12 – 2021-07-30 (×23): 10 mL
  Administered 2021-07-31: 20 mL
  Administered 2021-08-01 – 2021-08-18 (×24): 10 mL

## 2021-07-12 MED ORDER — SODIUM CHLORIDE 0.9% FLUSH
10.0000 mL | INTRAVENOUS | Status: DC | PRN
  Administered 2021-07-14 – 2021-08-07 (×5): 10 mL

## 2021-07-12 MED ORDER — VANCOMYCIN HCL 1250 MG/250ML IV SOLN
1250.0000 mg | Freq: Once | INTRAVENOUS | Status: AC
  Administered 2021-07-13: 1250 mg via INTRAVENOUS
  Filled 2021-07-12: qty 250

## 2021-07-12 MED ORDER — CHLORHEXIDINE GLUCONATE CLOTH 2 % EX PADS
6.0000 | MEDICATED_PAD | Freq: Every day | CUTANEOUS | Status: DC
  Administered 2021-07-12 – 2021-08-13 (×32): 6 via TOPICAL

## 2021-07-12 MED ORDER — VANCOMYCIN HCL IN DEXTROSE 1-5 GM/200ML-% IV SOLN
1000.0000 mg | INTRAVENOUS | Status: DC
  Administered 2021-07-14 – 2021-08-07 (×25): 1000 mg via INTRAVENOUS
  Filled 2021-07-12 (×27): qty 200

## 2021-07-12 NOTE — Progress Notes (Signed)
Pharmacy Antibiotic Note  Yvette Gaines is a 50 y.o. 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 Patient was previously not clearing her blood cultures and was put on combination therapy with daptomycin and ceftaroline for 7 days ending today. Blood cultures from 10/29 now no growth final, so will switch back to vancomycin alone.   Pharmacy has been consulted for vancomycin dosing.Most recent SCr - 0.56 and CrCl ~ 65 ml/min.   Plan: Vancomycin 1250 mg x 1 tomorrow at 2000 Then Vancomycin 1000 mg every 24 hours Predicted AUC 484 with rounded Scr 1  Monitor levels, blood cultures and renal function    Height: 5\' 2"  (157.5 cm) Weight: 59 kg (130 lb) IBW/kg (Calculated) : 50.1  Temp (24hrs), Avg:98.4 F (36.9 C), Min:98.1 F (36.7 C), Max:98.7 F (37.1 C)  Recent Labs  Lab 07/11/21 0150  WBC 7.0  CREATININE 0.56    Estimated Creatinine Clearance: 66.5 mL/min (by C-G formula based on SCr of 0.56 mg/dL).    No Known Allergies    Thank you for allowing pharmacy to be a part of this patient's care.  Jimmy Footman, PharmD, BCPS, [facility] Clinical Pharmacist Phone: [phone] 07/12/2021 2:01 PM

## 2021-07-12 NOTE — Progress Notes (Signed)
ID Brief Progress Note:    Yvette Gaines is a 50 y.o. female with MRSA bacteremia now finally clearing on daptomycin + ceftaroline. Would continue 7-days of the combination then plan to switch back to vancomycin tomorrow 11/4.    [name], MSN, NP-C [facility] for Infectious Disease [name] Pager: [phone] Office: [phone] [place]: [phone]

## 2021-07-12 NOTE — Progress Notes (Signed)
[facility] PROGRESS NOTE  Yvette Gaines QPY:[id number] DOB: May 08, 1971 DOA: 06/29/2021 PCP: [name], DO  Status: Remains inpatient appropriate because:  Unsafe to discharge patient with history of IVDU with PICC line in place for outpatient IV therapy Patient requires a total of 6 weeks of IV antibiotics beginning 11/3  Barriers to discharge: Social: IVDU history so unable to discharge to complete antibiotic therapies in the outpatient environment  Clinical: Final blood cultures from 10/29 returned negative as of 11/3 Also initiated on Suboxone this admission to treat substance abuse and will need to have follow-up appointment with Suboxone clinic prior to prescribing Suboxone at discharge.  Suboxone will need to be prescribed by any attending physician that has privileges to prescribe this medication-because of requirement for Suboxone not only acutely but after discharge it is unlikely she would receive an SNF bed offer to complete her antibiotics.  In addition her antibiotics are quite expensive and this would also be a limiting factor for SNF placement.  Level of care:  Med-Surg   Code Status: Full Family Communication: Patient DVT prophylaxis: Lovenox COVID vaccination status: Unknown  HPI: 50 y.o. 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   Patient presented to [facility] on 10/05 for worsening of left shoulder swelling and pain.  Was found to have abscess of left shoulder and left calf,  I&D was done on 10/06 and culture showed MRSA.[name]  Patient however signed out [place] after the surgery. On 10/16, patient came back to [facility] complaining about new onset of chest pains and shortness of breath, CT chest showed multiple bilateral septic emboli,  echocardiogram showed new onset of tricuspid vegetation compatible with endocarditis.  Blood culture showed again MRSA.  Patient was started on vancomycin and cefepime since. During hospital stay, patient was also found to develop acute thrombocytopenia, DIC was ruled out.  Patient was admitted to [facility], and started on IV vancomycin, TEE with tricuspid leaflet vegetation.   Subjective: Patient alert.  States got up early this morning to shower and get dressed and is now reading a book.  States her insomnia has improved with the addition of preadmission psychotropic medications.  Objective: Vitals:   07/11/21 2123 07/12/21 0515  BP: 111/77 119/76  Pulse: 74 83  Resp: 18 18  Temp: 98.4 F (36.9 C) 98.7 F (37.1 C)  SpO2: 98% 98%    Intake/Output Summary (Last 24 hours) at 07/12/2021 0755 Last data filed at 07/11/2021 1300 Gross per 24 hour  Intake 400 ml  Output --  Net 400 ml   Filed Weights   06/29/21 1100 07/03/21 1237  Weight: 60.3 kg 59 kg    Exam:  Constitutional: NAD, calm, comfortable Respiratory: RA, lungs are CTA on anterior exam, normal respiratory effort Cardiovascular: Regular rate and rhythm, No extremity edema.  S1-S2, normotensive Abdomen: no tenderness. Bowel sounds positive. LBM 11/02 Neurologic: CN 2-12 grossly intact. Sensation intact, DTR normal. Strength 5/5 x all 4 extremities.  Psychiatric: Normal judgment and insight. Alert and oriented x 3. Normal mood.    Assessment/Plan: Acute problems:   MRSA bacteremia Active Problems:   Severe opioid use disorder (HCC)   Endocarditis   Septic pulmonary embolism (HCC)   Chronic viral hepatitis C (HCC)   Shoulder abscess   Calf abscess   Odynophagia   Protein-calorie  malnutrition, severe     MRSA bacteremia/MRSA TV endocarditis/septic emboli/recent right shoulder and right calf abscess /status post I&D 10/6 (at [facility])  -TEE consistent with tricuspid valve endocarditis.  CT surgery  documented not a candidate for angio VAC due to small size of vegetation -Not a candidate to go home with PICC line to history of active IV narcotic abuse prior to admission -Vancomycin discontinued, narrowed to Teflaro and daptomycin by ID. -Last blood cultures obtained on 10/29 turned negative final culture on 11/3.  Discussed with attending and will ask for PICC line to be placed today 11/3 -Follow CK at discretion of ID  IVDU with narcotics -Started on Suboxone this admission recommend continue after discharge -Last date of antibiotics to do is 08/23/2021 -Once ready for discharge I will ask Dr. Loleta Books to prescribe Suboxone  Depression and insomnia New preadmission Celexa, BuSpar and trazodone at bedtime  Patient reports improvement in insomnia  Thrombocytopenia:  Resolved.   Recent right shoulder and calf abscess -Source of bacteremia -Status post I&D on 10/06 at [facility] -At outside hospital revealed no infectious involvement of AC joint    Severe protein calorie malnutrition Nutrition Problem: Severe Malnutrition Etiology: chronic illness and poor intake prior to admission secondary to IVDU Body mass index is 23.78 kg/m.  Signs/Symptoms: severe fat depletion, severe muscle depletion Interventions: Ensure Enlive (each supplement provides 350kcal and 20 grams of protein), Magic cup, MVI    Mild intermittent asthma/current tobacco abuse -Currently asymptomatic -Continue prn bronchodilators -Continue nicotine patch   Normocytic anemia -Anemia panel: low iron which is consistent with chronic anemia for nutrition -Current hemoglobin 7.8 -Transfuse for hemoglobin less than 7.      +HCV AB -ID recommended treatment after discharge. -Also ID recommended HIV preexposure prophylaxis with either oral Truvada or long-acting aptitude injectable   Blood culture data: -Blood cultures positive for MRSA on 10/21, 10/22.   -Blood cultures 1/2 positive from 10/24. -Blood  cultures 1/2 positive from 10/25. -Blood cultures 2 out of 2 positive from 10/27. -Blood cultures from 10/29 and finalized on 11/3   Data Reviewed: Basic Metabolic Panel: Recent Labs  Lab 07/11/21 0150  NA 134*  K 4.1  CL 101  CO2 25  GLUCOSE 97  BUN 10  CREATININE 0.56  CALCIUM 8.5*  MG 2.1   CBC: Recent Labs  Lab 07/11/21 0150  WBC 7.0  NEUTROABS 4.4  HGB 8.9*  HCT 28.4*  MCV 89.9  PLT 505*   Cardiac Enzymes: Recent Labs  Lab 07/06/21 1318  CKTOTAL 16*      Recent Results (from the past 240 hour(s))  Culture, blood (routine x 2)     Status: Abnormal (Preliminary result)   Collection Time: 07/02/21  8:50 AM   Specimen: BLOOD  Result Value Ref Range Status   Specimen Description BLOOD SITE NOT SPECIFIED  Final   Special Requests IN PEDIATRIC BOTTLE Blood Culture adequate volume  Final   Culture  Setup Time   Final    GRAM POSITIVE COCCI AEROBIC BOTTLE ONLY CRITICAL VALUE NOTED.  VALUE IS CONSISTENT WITH PREVIOUSLY REPORTED AND CALLED VALUE.    Culture (A)  Final    STAPHYLOCOCCUS AUREUS Sent to [place] for further susceptibility testing. Performed at [facility], [place] [address] [place], [place] [address]    Report Status PENDING  Incomplete  Culture, blood (routine x 2)     Status: None   Collection Time: 07/02/21 11:50 AM   Specimen: BLOOD RIGHT FOREARM  Result  Value Ref Range Status   Specimen Description BLOOD RIGHT FOREARM  Final   Special Requests   Final    BOTTLES DRAWN AEROBIC AND ANAEROBIC Blood Culture adequate volume   Culture   Final    NO GROWTH 5 DAYS Performed at [facility], [address], [place], [place] [address]    Report Status 07/07/2021 FINAL  Final  Culture, blood (routine x 2)     Status: Abnormal   Collection Time: 07/03/21  8:06 PM   Specimen: BLOOD  Result Value Ref Range Status   Specimen Description BLOOD RIGHT ARM  Final   Special Requests   Final    BOTTLES DRAWN AEROBIC AND ANAEROBIC Blood  Culture adequate volume   Culture  Setup Time   Final    GRAM POSITIVE COCCI IN CLUSTERS ANAEROBIC BOTTLE ONLY CRITICAL VALUE NOTED.  VALUE IS CONSISTENT WITH PREVIOUSLY REPORTED AND CALLED VALUE.    Culture (A)  Final    STAPHYLOCOCCUS AUREUS SUSCEPTIBILITIES PERFORMED ON PREVIOUS CULTURE WITHIN THE LAST 5 DAYS. Performed at [facility], [place] [address], [place], [place] [address]    Report Status 07/07/2021 FINAL  Final  Culture, blood (routine x 2)     Status: Abnormal   Collection Time: 07/03/21  8:06 PM   Specimen: BLOOD  Result Value Ref Range Status   Specimen Description BLOOD LEFT ARM  Final   Special Requests   Final    BOTTLES DRAWN AEROBIC AND ANAEROBIC Blood Culture adequate volume   Culture  Setup Time   Final    GRAM POSITIVE COCCI IN CLUSTERS ANAEROBIC BOTTLE ONLY CRITICAL VALUE NOTED.  VALUE IS CONSISTENT WITH PREVIOUSLY REPORTED AND CALLED VALUE. Performed at [facility], [place] [address], [place], [place] [address]    Culture METHICILLIN RESISTANT STAPHYLOCOCCUS AUREUS (A)  Final   Report Status 07/09/2021 FINAL  Final   Organism ID, Bacteria METHICILLIN RESISTANT STAPHYLOCOCCUS AUREUS  Final      Susceptibility   Methicillin resistant staphylococcus aureus - MIC*    CIPROFLOXACIN <=0.5 SENSITIVE Sensitive     ERYTHROMYCIN >=8 RESISTANT Resistant     GENTAMICIN <=0.5 SENSITIVE Sensitive     OXACILLIN >=4 RESISTANT Resistant     TETRACYCLINE <=1 SENSITIVE Sensitive     VANCOMYCIN 1 SENSITIVE Sensitive     TRIMETH/SULFA <=10 SENSITIVE Sensitive     CLINDAMYCIN <=0.25 SENSITIVE Sensitive     RIFAMPIN <=0.5 SENSITIVE Sensitive     Inducible Clindamycin NEGATIVE Sensitive     * METHICILLIN RESISTANT STAPHYLOCOCCUS AUREUS  Culture, blood (Routine X 2) w Reflex to ID Panel     Status: Abnormal   Collection Time: 07/05/21  7:00 PM   Specimen: BLOOD RIGHT ARM  Result Value Ref Range Status   Specimen Description BLOOD RIGHT ARM  Final   Special  Requests   Final    BOTTLES DRAWN AEROBIC AND ANAEROBIC Blood Culture adequate volume   Culture  Setup Time   Final    GRAM POSITIVE COCCI IN BOTH AEROBIC AND ANAEROBIC BOTTLES CRITICAL VALUE NOTED.  VALUE IS CONSISTENT WITH PREVIOUSLY REPORTED AND CALLED VALUE.    Culture (A)  Final    STAPHYLOCOCCUS AUREUS SUSCEPTIBILITIES PERFORMED ON PREVIOUS CULTURE WITHIN THE LAST 5 DAYS. Performed at [facility], [place] [address], [place], [place] [address]    Report Status 07/09/2021 FINAL  Final  Culture, blood (Routine X 2) w Reflex to ID Panel     Status: Abnormal  Collection Time: 07/05/21  7:06 PM   Specimen: BLOOD LEFT WRIST  Result Value Ref Range Status   Specimen Description BLOOD LEFT WRIST  Final   Special Requests   Final    BOTTLES DRAWN AEROBIC AND ANAEROBIC Blood Culture results may not be optimal due to an inadequate volume of blood received in culture bottles   Culture  Setup Time   Final    GRAM POSITIVE COCCI IN BOTH AEROBIC AND ANAEROBIC BOTTLES CRITICAL VALUE NOTED.  VALUE IS CONSISTENT WITH PREVIOUSLY REPORTED AND CALLED VALUE.    Culture (A)  Final    STAPHYLOCOCCUS AUREUS SUSCEPTIBILITIES PERFORMED ON PREVIOUS CULTURE WITHIN THE LAST 5 DAYS. Performed at [facility], [place] [address] [place], [place] [address]    Report Status 07/08/2021 FINAL  Final  Culture, blood (Routine X 2) w Reflex to ID Panel     Status: None   Collection Time: 07/07/21  2:38 PM   Specimen: BLOOD RIGHT ARM  Result Value Ref Range Status   Specimen Description BLOOD RIGHT ARM  Final   Special Requests   Final    BOTTLES DRAWN AEROBIC ONLY Blood Culture results may not be optimal due to an inadequate volume of blood received in culture bottles   Culture   Final    NO GROWTH 5 DAYS Performed at [facility], [place] [address] [place], [place] [address]    Report Status 07/12/2021 FINAL  Final  Culture, blood (Routine X 2) w Reflex to ID Panel     Status: None    Collection Time: 07/07/21  2:39 PM   Specimen: BLOOD RIGHT HAND  Result Value Ref Range Status   Specimen Description BLOOD RIGHT HAND  Final   Special Requests   Final    BOTTLES DRAWN AEROBIC ONLY Blood Culture results may not be optimal due to an inadequate volume of blood received in culture bottles   Culture   Final    NO GROWTH 5 DAYS Performed at [facility], [place] [address], [place], [place] [address]    Report Status 07/12/2021 FINAL  Final     Studies: No results found.  Scheduled Meds:  buprenorphine-naloxone  1.5 tablet Sublingual Daily   busPIRone  5 mg Oral TID   citalopram  20 mg Oral Daily   enoxaparin (LOVENOX) injection  40 mg Subcutaneous Q24H   famotidine  20 mg Oral Daily   feeding supplement  237 mL Oral TID BM   multivitamin with minerals  1 tablet Oral Daily   nicotine  21 mg Transdermal Daily   traZODone  150 mg Oral QHS   Continuous Infusions:  ceFTAROline (TEFLARO) IV 600 mg (07/12/21 0520)   DAPTOmycin (CUBICIN)  IV 500 mg (07/11/21 2032)    Principal Problem:   MRSA bacteremia Active Problems:   Severe opioid use disorder ([place])   Endocarditis   Septic pulmonary embolism (HCC)   Chronic viral hepatitis C ([place])   Shoulder abscess   Calf abscess   Odynophagia   Protein-calorie malnutrition, severe   Consultants: Infectious disease Cardiothoracic surgery  Procedures: TTE TEE  Antibiotics: Vancomycin 10/21 through 10/28 Valacyclovir 10/23 through 10/29 Daptomycin 10/28 >> Ceftaroline 10/28 >>   Time spent: 35 minutes    Erin Hearing ANP  [facility] 7 am - 330 pm/M-F for direct patient care and secure chat Please refer to Amion for contact info 13  days

## 2021-07-12 NOTE — Progress Notes (Signed)
PT Cancellation Note  Patient Details Name: Roneka Gilpin MRN: [id number] DOB: 1971-05-28   Cancelled Treatment:    Reason Eval/Treat Not Completed: PT screened, no needs identified, will sign off.  Pt doesn't need PT services as she is ambulating in room and hall with independence.  Mobility team is following on unit as well.    Alvira Philips 07/12/2021, 9:23 AM Senna Lape M,PT Acute Rehab Services [phone] (pager)

## 2021-07-12 NOTE — Progress Notes (Signed)
Mobility Specialist Criteria Algorithm Info.  Mobility Team: HOB elevated: Activity: Ambulated in hall; Ambulated to bathroom; Dangled on edge of bed Range of motion: Active; All extremities Level of assistance: Independent Assistive device: None Minutes sitting in chair:  Minutes stood:  Minutes ambulated:  Distance ambulated (ft): 1650 ft Mobility response: Tolerated well Bed Position: Semi-fowlers  Patient ambulated in hallway independently with steady gait. Tolerated ambulation well. Was left dangling EOB with all needs met.   07/12/2021 4:19 PM

## 2021-07-13 ENCOUNTER — Inpatient Hospital Stay (HOSPITAL_COMMUNITY): Payer: 59

## 2021-07-13 ENCOUNTER — Other Ambulatory Visit (HOSPITAL_COMMUNITY): Payer: 59

## 2021-07-13 DIAGNOSIS — B9562 Methicillin resistant Staphylococcus aureus infection as the cause of diseases classified elsewhere: Secondary | ICD-10-CM | POA: Diagnosis not present

## 2021-07-13 DIAGNOSIS — R0781 Pleurodynia: Secondary | ICD-10-CM | POA: Diagnosis not present

## 2021-07-13 DIAGNOSIS — L02419 Cutaneous abscess of limb, unspecified: Secondary | ICD-10-CM | POA: Diagnosis not present

## 2021-07-13 DIAGNOSIS — I330 Acute and subacute infective endocarditis: Secondary | ICD-10-CM | POA: Diagnosis not present

## 2021-07-13 DIAGNOSIS — R7881 Bacteremia: Secondary | ICD-10-CM | POA: Diagnosis not present

## 2021-07-13 DIAGNOSIS — R079 Chest pain, unspecified: Secondary | ICD-10-CM

## 2021-07-13 LAB — SEDIMENTATION RATE: Sed Rate: 78 mm/hr — ABNORMAL HIGH (ref 0–22)

## 2021-07-13 LAB — TROPONIN I (HIGH SENSITIVITY): Troponin I (High Sensitivity): 3 ng/L (ref <18)

## 2021-07-13 LAB — C-REACTIVE PROTEIN: CRP: 4.2 mg/dL — ABNORMAL HIGH (ref <1.0)

## 2021-07-13 IMAGING — CT CT ANGIO CHEST
2 of 6 series · 19 of 36 positions shown · IV contrast (omnipaque)
Comparison: [DATE]

CLINICAL DATA: 50-year-old female with pleuritic chest pain in the
setting of septic emboli endocarditis. Evaluate for pulmonary
embolism.

EXAM:
CT ANGIOGRAPHY CHEST WITH CONTRAST
TECHNIQUE: Multidetector CT imaging of the chest was performed using the
standard protocol during bolus administration of intravenous
contrast. Multiplanar CT image reconstructions and MIPs were
obtained to evaluate the vascular anatomy.
CONTRAST:  Eighty mL Omnipaque 350, intravenous

[Series 7: pe thins · axial · 0.62mm/px · z∈[+1166,+1404]mm · 18 of 379 slices shown]
[im 19/379  lung]
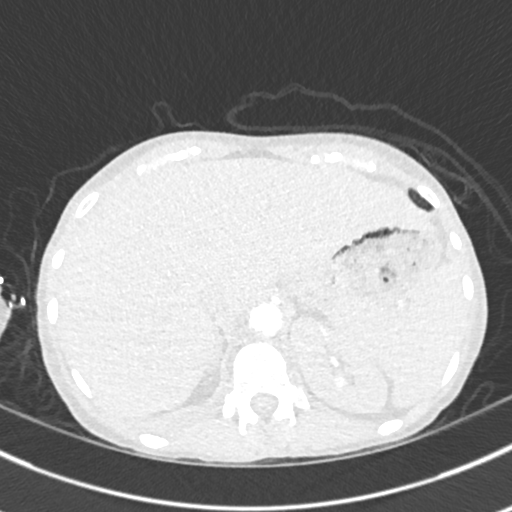
[im 38/379  mediastinal]
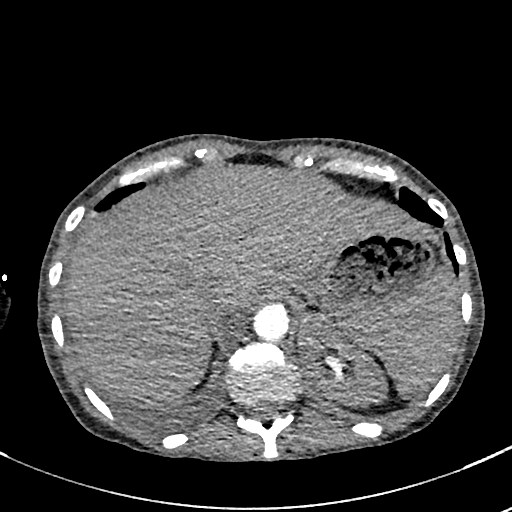
[im 57/379  lung]
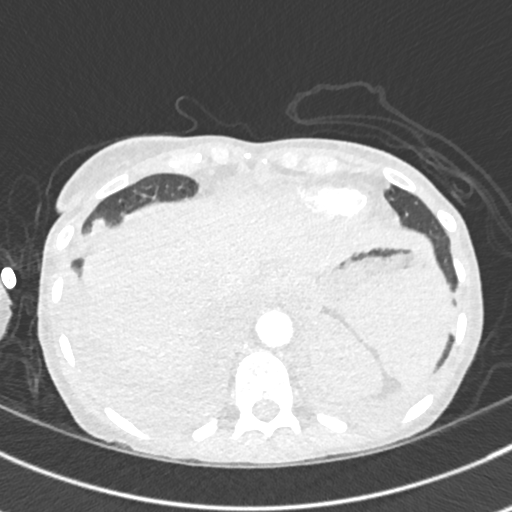
[im 76/379  mediastinal]
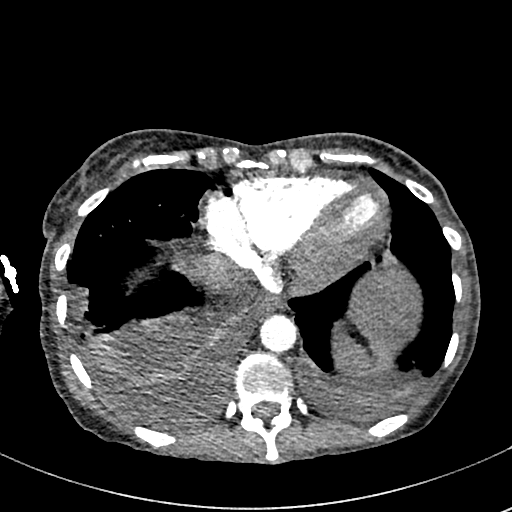
[im 95/379  lung]
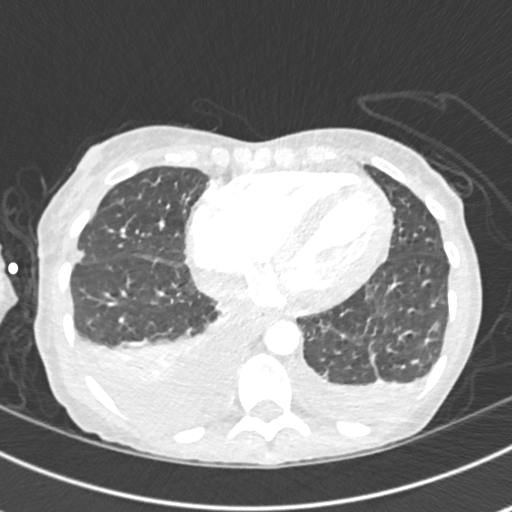
[im 114/379  mediastinal]
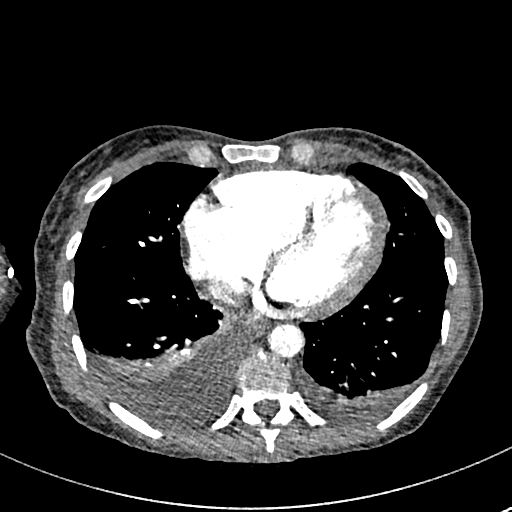
[im 133/379  lung]
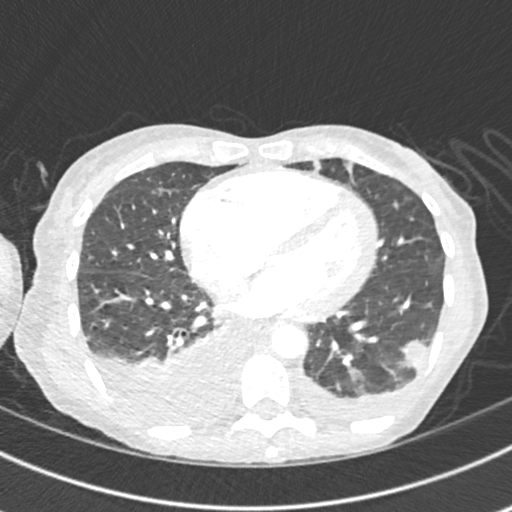
[im 152/379  mediastinal]
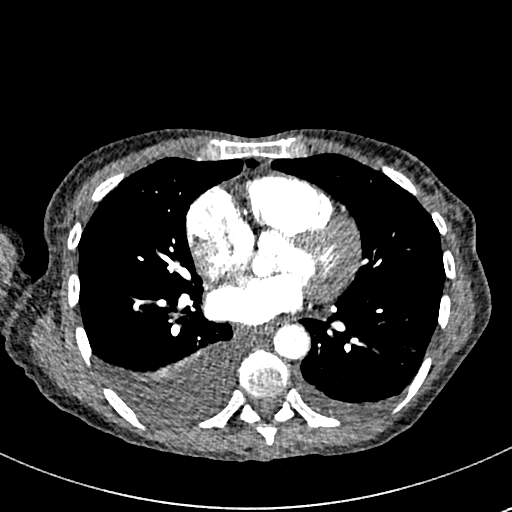
[im 171/379  lung]
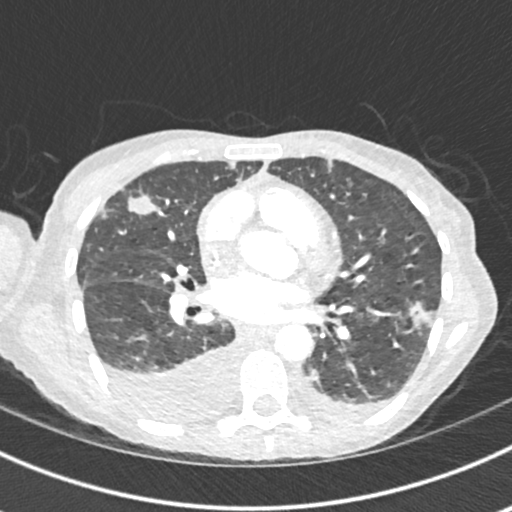
[im 208/379  mediastinal]
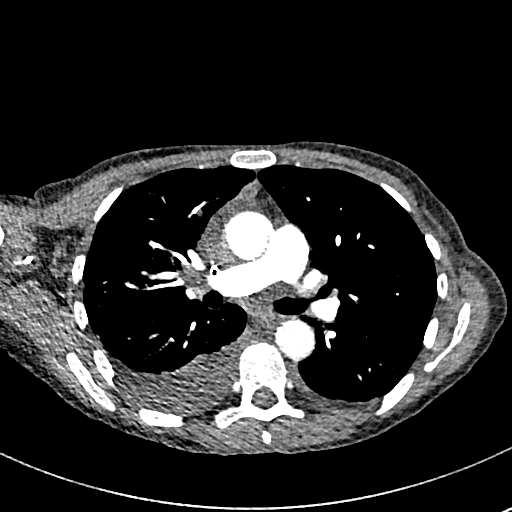
[im 227/379  lung]
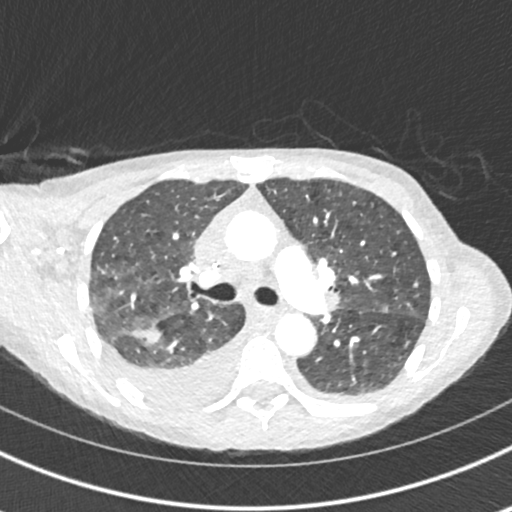
[im 246/379  mediastinal]
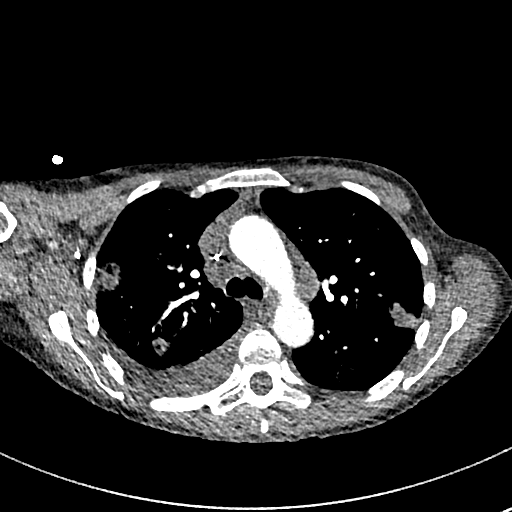
[im 265/379  lung]
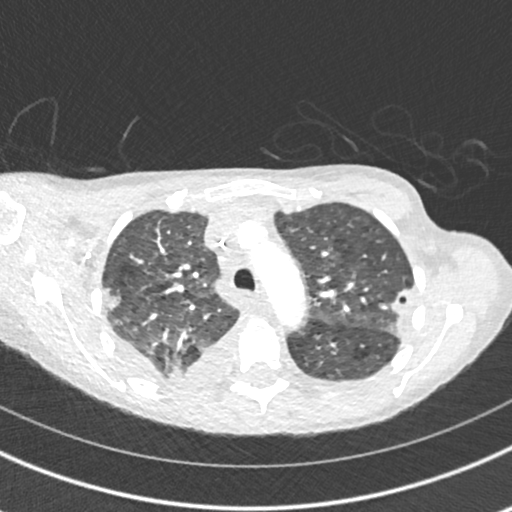
[im 284/379  mediastinal]
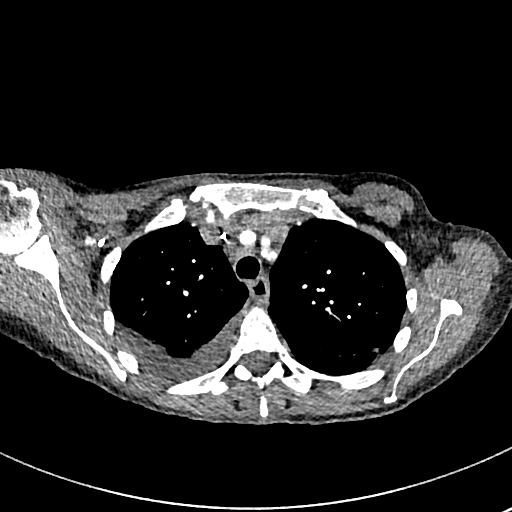
[im 303/379  lung]
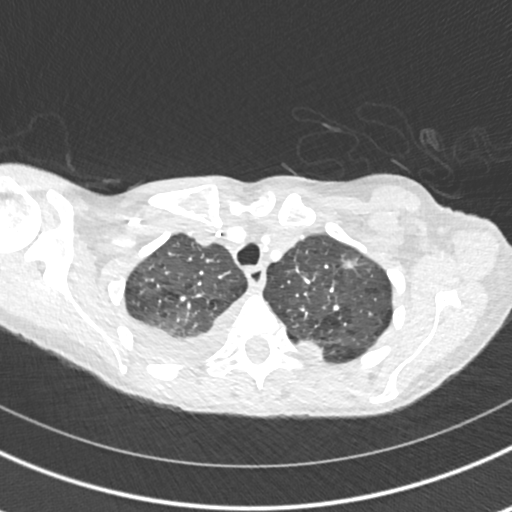
[im 322/379  mediastinal]
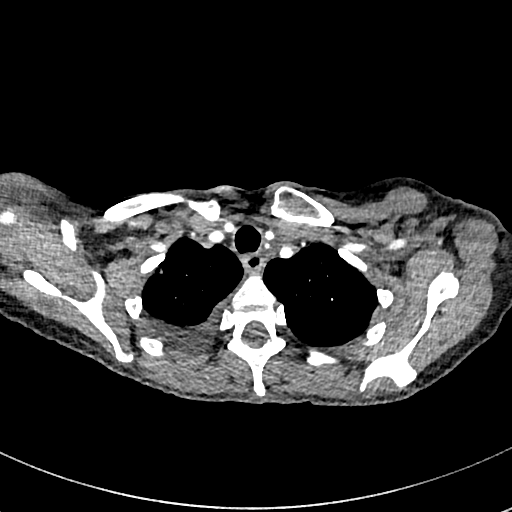
[im 341/379  lung]
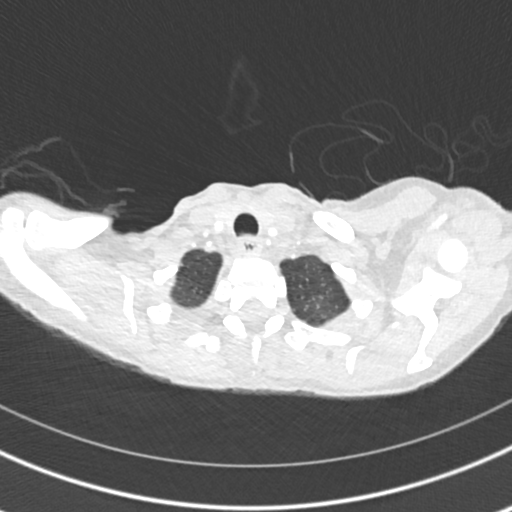
[im 360/379  mediastinal]
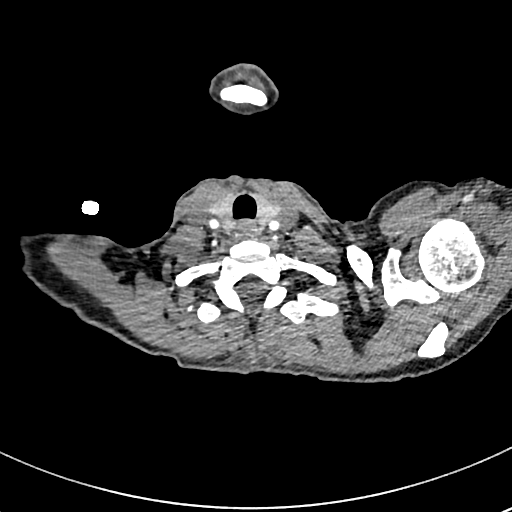

[Series 8: pe 2mm cor · coronal · 0.55mm/px · 1 of 121 slices shown]
[im 61/121  mediastinal]
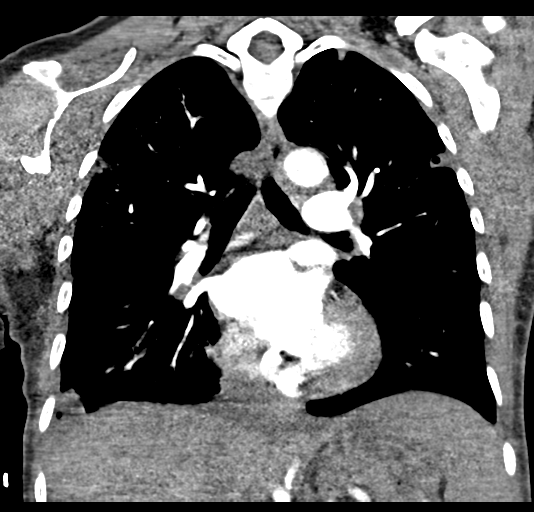

[19 of 36 positions shown; findings below may reference images not displayed]

FINDINGS: Cardiovascular: Satisfactory opacification of the pulmonary arteries
to the segmental level. No evidence of pulmonary embolism. Normal
heart size. No pericardial effusion. Right upper extremity PICC in
place with the distal tip about the cavoatrial junction.

Mediastinum/Nodes: No enlarged mediastinal, hilar, or axillary lymph
nodes. Thyroid gland, trachea, and esophagus demonstrate no
significant findings.

Lungs/Pleura: Interval development of small right and trace left
pleural effusions with associated passive atelectasis. Was a
attenuation pattern. Similar appearing multifocal peripheral
cavitary pulmonary lesions, now globally with increased surrounding
consolidative opacity. No pneumothorax.

Upper Abdomen: The visualized upper abdomen is within normal limits.

Musculoskeletal: No chest wall abnormality. No acute or significant
osseous findings.

Review of the MIP images confirms the above findings.
IMPRESSION: Vascular:

No evidence of pulmonary embolism.

Non-Vascular:

1. Interval development of small right trace left pleural effusion
with associated passive atelectasis.
2. Continued evolution of septic pulmonary emboli, now globally with
increased surrounding consolidative opacifications.

## 2021-07-13 MED ORDER — OXYCODONE HCL 5 MG PO TABS: 10.0000 mg | ORAL_TABLET | Freq: Four times a day (QID) | ORAL | Status: DC

## 2021-07-13 MED ORDER — OXYCODONE HCL 5 MG PO TABS
10.0000 mg | ORAL_TABLET | Freq: Four times a day (QID) | ORAL | Status: DC
  Administered 2021-07-13 – 2021-07-17 (×16): 10 mg via ORAL
  Filled 2021-07-13 (×16): qty 2

## 2021-07-13 MED ORDER — OXYCODONE HCL 5 MG PO TABS: 5.0000 mg | ORAL_TABLET | Freq: Four times a day (QID) | ORAL | Status: DC | PRN

## 2021-07-13 MED ORDER — OXYCODONE HCL 5 MG PO TABS
5.0000 mg | ORAL_TABLET | ORAL | Status: DC | PRN
  Administered 2021-07-13: 5 mg via ORAL
  Filled 2021-07-13: qty 1

## 2021-07-13 MED ORDER — IOHEXOL 350 MG/ML SOLN
80.0000 mL | Freq: Once | INTRAVENOUS | Status: AC | PRN
  Administered 2021-07-13: 80 mL via INTRAVENOUS

## 2021-07-13 MED ORDER — IOHEXOL 300 MG/ML  SOLN
80.0000 mL | Freq: Once | INTRAMUSCULAR | Status: AC | PRN
  Administered 2021-07-13: 80 mL via INTRAVENOUS

## 2021-07-13 MED ORDER — KETOROLAC TROMETHAMINE 10 MG PO TABS
10.0000 mg | ORAL_TABLET | Freq: Three times a day (TID) | ORAL | Status: AC
  Administered 2021-07-13 – 2021-07-18 (×14): 10 mg via ORAL
  Filled 2021-07-13 (×16): qty 1

## 2021-07-13 NOTE — Progress Notes (Signed)
SATURATION QUALIFICATIONS: (This note is used to comply with regulatory documentation for home oxygen)  Patient Saturations on Room Air at Rest = 99%  Patient Saturations on Room Air while Ambulating = 100%  Patient Saturations on 0 Liters of oxygen while Ambulating = n/a%  Please briefly explain why patient needs home oxygen:

## 2021-07-13 NOTE — Progress Notes (Addendum)
[facility] PROGRESS NOTE  Yvette Gaines QMG:[id number] DOB: 21-Sep-1970 DOA: 06/29/2021 PCP: [name], DO  Status: Remains inpatient appropriate because:  Unsafe to discharge patient with history of IVDU with PICC line in place for outpatient IV therapy Patient requires a total of 6 weeks of IV antibiotics beginning 11/3  Barriers to discharge: Social: IVDU history so unable to discharge to complete antibiotic therapies in the outpatient environment  Clinical: Final blood cultures from 10/29 returned negative as of 11/3 Also initiated on Suboxone this admission to treat substance abuse and will need to have follow-up appointment with Suboxone clinic prior to prescribing Suboxone at discharge.  Suboxone will need to be prescribed by any attending physician that has privileges to prescribe this medication-because of requirement for Suboxone not only acutely but after discharge it is unlikely she would receive an SNF bed offer to complete her antibiotics.  In addition her antibiotics are quite expensive and this would also be a limiting factor for SNF placement.  Level of care:  Med-Surg   Code Status: Full Family Communication: Patient DVT prophylaxis: Lovenox COVID vaccination status: Unknown  HPI: 50 y.o. 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 seeking treatment.   Patient presented to [facility] on 10/05 for worsening of left shoulder swelling and pain.  Was found to have abscess of left shoulder and left calf,  I&D was done on 10/06 and culture showed MRSA.[name]  Patient however signed out [place] after the surgery. On 10/16, patient came back to [facility] complaining about new onset of chest pains and shortness of breath, CT chest showed multiple bilateral septic emboli,  echocardiogram showed new onset of tricuspid vegetation compatible with endocarditis.  Blood culture showed again MRSA.  Patient was started on vancomycin and cefepime since. During hospital stay, patient was also found to develop acute thrombocytopenia, DIC was ruled out.  Patient was admitted to [facility], and started on IV vancomycin, TEE with tricuspid leaflet vegetation.   Subjective: Patient ambulating in room.  Appeared to be uncomfortable.  She states she is having different chest discomfort than she had been having over the past several days which was more in the upper chest upon awakening and would resolve after movement.  He is now having diffuse pleuritic chest pain with a foci of pain over the left chest that she describes as an ache.  Patient updated on the fact that since she has insurance we are trying to get her a bed offer at a nursing home to finish her antibiotics.  Unfortunately nursing homes do not have providers that can manage Suboxone therefore we will have to stop her Suboxone temporarily with the understanding that once she is discharged from the nursing facility she will follow-up at her prior Suboxone clinic so she can resume this medication.  She was made aware that in the interim we will use regular scheduled short acting Oxy IR with extra doses available as needed so we can titrate an appropriate dose prior to discharge to SNF since this would not be able to be accomplished in SNF setting.  Objective: Vitals:   07/12/21 1953 07/13/21 0519  BP: 118/75 112/72  Pulse: 75 84  Resp: 16   Temp: 98.3 F (36.8 C) 98.5 F (36.9 C)  SpO2: 100% 98%   No intake or output data in  the 24 hours ending 07/13/21 0736  Filed Weights   06/29/21 1100 07/03/21 1237  Weight: 60.3 kg 59 kg    Exam:  Constitutional: NAD, mildly anxious, uncomfortable secondary to anterior/pleuritic chest discomfort Respiratory: RA, lungs are CTA on anterior exam, normal respiratory  effort-chest wall nontender to palpation Cardiovascular: Regular rate and rhythm, No extremity edema.  S1-S2 without audible rub, normotensive Abdomen: no tenderness. Bowel sounds positive. LBM 11/03 Neurologic: CN 2-12 grossly intact. Sensation intact, DTR normal. Strength 5/5 x all 4 extremities.  Psychiatric: Normal judgment and insight. Alert and oriented x 3.  Mildly anxious mood.    Assessment/Plan: Acute problems:   MRSA bacteremia Active Problems:   Severe opioid use disorder (HCC)   Endocarditis   Septic pulmonary embolism (HCC)   Chronic viral hepatitis C (HCC)   Shoulder abscess   Calf abscess   Odynophagia   Protein-calorie malnutrition, severe     MRSA bacteremia/MRSA TV endocarditis/septic emboli/recent right shoulder and right calf abscess /status post I&D 10/6 (at [facility])  -TEE consistent with tricuspid valve endocarditis.  CT surgery documented not a candidate for angio VAC due to small size of vegetation -Not a candidate to go home with PICC line to history of active IV narcotic abuse prior to admission -Vancomycin discontinued, narrowed to Teflaro and daptomycin by ID. -Last blood cultures obtained on 10/29 turned negative final culture on 11/3.  Discussed with attending and will ask for PICC line to be placed today 11/3 -Follow CK at discretion of ID  Chest pain -Sounds pleuritic in nature and no chest wall tenderness with palpation -Given history of septic emboli as well as endocarditis need to check CT chest with contrast to rule out additional emboli versus pulmonary infarct-also need to rule out PE -We will need to check echo to ensure valvular vegetation has not increased in size or has become very mobile -Check high-sensitivity troponin, CRP and ESR as well as EKG to rule out pericarditis noting patient reports chest discomfort over left anterior chest worse with leaning forward -CTA chest without PE, interval development of small right trace left  pleural effusion with associated passive atelectasis.  Continued evolution of septic pulmonary emboli now globally increased surrounding consolidated opacifications-add incentive spirometry -Add oral Toradol to help with inflammatory component of pleuritic chest discomfort-follow electrolytes daily -Check room air pulse oximetry while ambulating  IVDU with narcotics -Started on Suboxone this admission recommend continue after discharge -Last date of antibiotics to do is 08/23/2021 -Unfortunately if she obtains an SNF bed due to state requirements for prescribing Suboxone in the nonacute setting this medication cannot be continued at a skilled nursing facility.  Discussed with pharmacist as well as my attending physician and plan is to begin Oxy IR 10 mg scheduled every 6 hours with 5 mg every 4 hours as needed withdrawal symptoms -She reports that she was on Suboxone in the past at a clinic in [facility] but did not keep follow-up appointment  Depression and insomnia Continue preadmission Celexa, BuSpar and trazodone at bedtime  Patient reports improvement in insomnia  Thrombocytopenia:  Resolved.   Recent right shoulder and calf abscess -Source of bacteremia -Status post I&D on 10/06 at [facility] -At outside hospital revealed no infectious involvement of AC joint    Severe protein calorie malnutrition Nutrition Problem: Severe Malnutrition Etiology: chronic illness and poor intake prior to admission secondary to IVDU Body mass index is 23.78 kg/m.  Signs/Symptoms: severe fat depletion, severe muscle depletion Interventions: Ensure Enlive (each supplement  provides 350kcal and 20 grams of protein), Magic cup, MVI    Mild intermittent asthma/current tobacco abuse -Currently asymptomatic -Continue prn bronchodilators -Continue nicotine patch   Normocytic anemia -Anemia panel: low iron which is consistent with chronic anemia for nutrition -Current hemoglobin 7.8 -Transfuse  for hemoglobin less than 7.      +HCV AB -ID recommended treatment after discharge. -Also ID recommended HIV preexposure prophylaxis with either oral Truvada or long-acting aptitude injectable   Blood culture data: -Blood cultures positive for MRSA on 10/21, 10/22.   -Blood cultures 1/2 positive from 10/24. -Blood cultures 1/2 positive from 10/25. -Blood cultures 2 out of 2 positive from 10/27. -Blood cultures from 10/29 and finalized on 11/3   Data Reviewed: Basic Metabolic Panel: Recent Labs  Lab 07/11/21 0150  NA 134*  K 4.1  CL 101  CO2 25  GLUCOSE 97  BUN 10  CREATININE 0.56  CALCIUM 8.5*  MG 2.1   CBC: Recent Labs  Lab 07/11/21 0150  WBC 7.0  NEUTROABS 4.4  HGB 8.9*  HCT 28.4*  MCV 89.9  PLT 505*   Cardiac Enzymes: Recent Labs  Lab 07/06/21 1318  CKTOTAL 16*      Recent Results (from the past 240 hour(s))  Culture, blood (routine x 2)     Status: Abnormal   Collection Time: 07/03/21  8:06 PM   Specimen: BLOOD  Result Value Ref Range Status   Specimen Description BLOOD RIGHT ARM  Final   Special Requests   Final    BOTTLES DRAWN AEROBIC AND ANAEROBIC Blood Culture adequate volume   Culture  Setup Time   Final    GRAM POSITIVE COCCI IN CLUSTERS ANAEROBIC BOTTLE ONLY CRITICAL VALUE NOTED.  VALUE IS CONSISTENT WITH PREVIOUSLY REPORTED AND CALLED VALUE.    Culture (A)  Final    STAPHYLOCOCCUS AUREUS SUSCEPTIBILITIES PERFORMED ON PREVIOUS CULTURE WITHIN THE LAST 5 DAYS. Performed at [facility], [place] [address] [place], [place] [address]    Report Status 07/07/2021 FINAL  Final  Culture, blood (routine x 2)     Status: Abnormal   Collection Time: 07/03/21  8:06 PM   Specimen: BLOOD  Result Value Ref Range Status   Specimen Description BLOOD LEFT ARM  Final   Special Requests   Final    BOTTLES DRAWN AEROBIC AND ANAEROBIC Blood Culture adequate volume   Culture  Setup Time   Final    GRAM POSITIVE COCCI IN CLUSTERS ANAEROBIC BOTTLE  ONLY CRITICAL VALUE NOTED.  VALUE IS CONSISTENT WITH PREVIOUSLY REPORTED AND CALLED VALUE. Performed at [facility], [place] [address] [place], [place] [address]    Culture METHICILLIN RESISTANT STAPHYLOCOCCUS AUREUS (A)  Final   Report Status 07/09/2021 FINAL  Final   Organism ID, Bacteria METHICILLIN RESISTANT STAPHYLOCOCCUS AUREUS  Final      Susceptibility   Methicillin resistant staphylococcus aureus - MIC*    CIPROFLOXACIN <=0.5 SENSITIVE Sensitive     ERYTHROMYCIN >=8 RESISTANT Resistant     GENTAMICIN <=0.5 SENSITIVE Sensitive     OXACILLIN >=4 RESISTANT Resistant     TETRACYCLINE <=1 SENSITIVE Sensitive     VANCOMYCIN 1 SENSITIVE Sensitive     TRIMETH/SULFA <=10 SENSITIVE Sensitive     CLINDAMYCIN <=0.25 SENSITIVE Sensitive     RIFAMPIN <=0.5 SENSITIVE Sensitive     Inducible Clindamycin NEGATIVE Sensitive     * METHICILLIN RESISTANT STAPHYLOCOCCUS AUREUS  Culture, blood (Routine X 2) w Reflex to ID Panel  Status: Abnormal   Collection Time: 07/05/21  7:00 PM   Specimen: BLOOD RIGHT ARM  Result Value Ref Range Status   Specimen Description BLOOD RIGHT ARM  Final   Special Requests   Final    BOTTLES DRAWN AEROBIC AND ANAEROBIC Blood Culture adequate volume   Culture  Setup Time   Final    GRAM POSITIVE COCCI IN BOTH AEROBIC AND ANAEROBIC BOTTLES CRITICAL VALUE NOTED.  VALUE IS CONSISTENT WITH PREVIOUSLY REPORTED AND CALLED VALUE.    Culture (A)  Final    STAPHYLOCOCCUS AUREUS SUSCEPTIBILITIES PERFORMED ON PREVIOUS CULTURE WITHIN THE LAST 5 DAYS. Performed at [facility] [address] [place], [place] [address]    Report Status 07/09/2021 FINAL  Final  Culture, blood (Routine X 2) w Reflex to ID Panel     Status: Abnormal   Collection Time: 07/05/21  7:06 PM   Specimen: BLOOD LEFT WRIST  Result Value Ref Range Status   Specimen Description BLOOD LEFT WRIST  Final   Special Requests   Final    BOTTLES DRAWN AEROBIC AND ANAEROBIC Blood Culture  results may not be optimal due to an inadequate volume of blood received in culture bottles   Culture  Setup Time   Final    GRAM POSITIVE COCCI IN BOTH AEROBIC AND ANAEROBIC BOTTLES CRITICAL VALUE NOTED.  VALUE IS CONSISTENT WITH PREVIOUSLY REPORTED AND CALLED VALUE.    Culture (A)  Final    STAPHYLOCOCCUS AUREUS SUSCEPTIBILITIES PERFORMED ON PREVIOUS CULTURE WITHIN THE LAST 5 DAYS. Performed at [facility], [place] [address], [place], [place] [address]    Report Status 07/08/2021 FINAL  Final  Culture, blood (Routine X 2) w Reflex to ID Panel     Status: None   Collection Time: 07/07/21  2:38 PM   Specimen: BLOOD RIGHT ARM  Result Value Ref Range Status   Specimen Description BLOOD RIGHT ARM  Final   Special Requests   Final    BOTTLES DRAWN AEROBIC ONLY Blood Culture results may not be optimal due to an inadequate volume of blood received in culture bottles   Culture   Final    NO GROWTH 5 DAYS Performed at [facility], [place] [address] [place], [place] [address]    Report Status 07/12/2021 FINAL  Final  Culture, blood (Routine X 2) w Reflex to ID Panel     Status: None   Collection Time: 07/07/21  2:39 PM   Specimen: BLOOD RIGHT HAND  Result Value Ref Range Status   Specimen Description BLOOD RIGHT HAND  Final   Special Requests   Final    BOTTLES DRAWN AEROBIC ONLY Blood Culture results may not be optimal due to an inadequate volume of blood received in culture bottles   Culture   Final    NO GROWTH 5 DAYS Performed at [facility], [place] [address] [place], [place] [address]    Report Status 07/12/2021 FINAL  Final     Studies: [place] EKG SITE RITE  Result Date: 07/12/2021 If Site Rite image not attached, placement could not be confirmed due to current cardiac rhythm.   Scheduled Meds:  buprenorphine-naloxone  1.5 tablet Sublingual Daily   busPIRone  5 mg Oral TID   Chlorhexidine Gluconate Cloth  6 each Topical Daily   citalopram  20 mg Oral Daily    enoxaparin (LOVENOX) injection  40 mg Subcutaneous Q24H   famotidine  20 mg Oral Daily   feeding  supplement  237 mL Oral TID BM   multivitamin with minerals  1 tablet Oral Daily   nicotine  21 mg Transdermal Daily   sodium chloride flush  10-40 mL Intracatheter Q12H   traZODone  150 mg Oral QHS   Continuous Infusions:  [START ON 07/14/2021] vancomycin     vancomycin      Principal Problem:   MRSA bacteremia Active Problems:   Severe opioid use disorder ([place])   Endocarditis   Septic pulmonary embolism (HCC)   Chronic viral hepatitis C ([place])   Shoulder abscess   Calf abscess   Odynophagia   Protein-calorie malnutrition, severe   Consultants: Infectious disease Cardiothoracic surgery  Procedures: TTE TEE  Antibiotics: Vancomycin 10/21 through 10/28 Valacyclovir 10/23 through 10/29 Daptomycin 10/28 >> Ceftaroline 10/28 >>   Time spent: 35 minutes    Erin Hearing ANP  [facility] 7 am - 330 pm/M-F for direct patient care and secure chat Please refer to Amion for contact info 14  days

## 2021-07-13 NOTE — Progress Notes (Signed)
  Mobility Specialist Criteria Algorithm Info.  Mobility Team: [facility] elevated:Self regulated Activity: Ambulated in room; Ambulated to bathroom Range of motion: Active; All extremities Level of assistance: Independent Assistive device: None Distance ambulated (ft): 550 ft Mobility response: Tolerated well Bed Position: Semi-fowlers  Patient received in bed willing to participate in mobility. Complained of pain in chest but not sure if related to a cardiac issue or picc line placement. Ambulated in hallway independently with steady gait using IV pole for stability. Tolerated ambulation well without incident was left in bed with all needs met and RN present.  07/13/2021 4:40 PM

## 2021-07-13 NOTE — Progress Notes (Signed)
Subjective:  Patient complaining of new chest pain that is present.  She also is very upset about the fact that she has been told that she is going to be transferred to a nursing home to complete IV antibiotics and that her Suboxone was stopped and she was restarted on "pain medications".  She became quite tearful is concerned that she will have higher risk of drug relapse.  This is the most upset I have seen her in quite some time.   Antibiotics:  Anti-infectives (From admission, onward)    Start     Dose/Rate Route Frequency Ordered Stop   07/14/21 2000  vancomycin (VANCOCIN) IVPB 1000 mg/200 mL premix        1,000 mg 200 mL/hr over 60 Minutes Intravenous Every 24 hours 07/12/21 1400     07/13/21 2000  vancomycin (VANCOREADY) IVPB 1250 mg/250 mL        1,250 mg 166.7 mL/hr over 90 Minutes Intravenous  Once 07/12/21 1400     07/06/21 1400  ceftaroline (TEFLARO) 600 mg in sodium chloride 0.9 % 100 mL IVPB        600 mg 100 mL/hr over 60 Minutes Intravenous Every 8 hours 07/06/21 1033 07/12/21 2229   07/06/21 1330  DAPTOmycin (CUBICIN) 500 mg in sodium chloride 0.9 % IVPB        500 mg 120 mL/hr over 30 Minutes Intravenous Daily 07/06/21 1233 07/12/21 2022   07/01/21 2200  vancomycin (VANCOREADY) IVPB 750 mg/150 mL  Status:  Discontinued        750 mg 150 mL/hr over 60 Minutes Intravenous Every 12 hours 07/01/21 1314 07/06/21 1033   07/01/21 1400  valACYclovir (VALTREX) tablet 1,000 mg        1,000 mg Oral 2 times daily 07/01/21 1306 07/08/21 0959   06/30/21 0200  vancomycin (VANCOREADY) IVPB 750 mg/150 mL  Status:  Discontinued        750 mg 150 mL/hr over 60 Minutes Intravenous Every 12 hours 06/29/21 1253 06/29/21 1254   06/29/21 1700  vancomycin (VANCOREADY) IVPB 750 mg/150 mL  Status:  Discontinued        750 mg 150 mL/hr over 60 Minutes Intravenous Every 12 hours 06/29/21 1308 07/01/21 1301   06/29/21 1615  rifampin (RIFADIN) capsule 300 mg  Status:  Discontinued         300 mg Oral Daily 06/29/21 1606 06/29/21 1607   06/29/21 1130  vancomycin (VANCOREADY) IVPB 1250 mg/250 mL  Status:  Discontinued        1,250 mg 166.7 mL/hr over 90 Minutes Intravenous  Once 06/29/21 1124 06/29/21 1254   06/29/21 1130  ceFEPIme (MAXIPIME) 2 g in sodium chloride 0.9 % 100 mL IVPB  Status:  Discontinued        2 g 200 mL/hr over 30 Minutes Intravenous  Once 06/29/21 1124 06/29/21 1251       Medications: Scheduled Meds:  busPIRone  5 mg Oral TID   Chlorhexidine Gluconate Cloth  6 each Topical Daily   citalopram  20 mg Oral Daily   enoxaparin (LOVENOX) injection  40 mg Subcutaneous Q24H   famotidine  20 mg Oral Daily   feeding supplement  237 mL Oral TID BM   ketorolac  10 mg Oral Q8H   multivitamin with minerals  1 tablet Oral Daily   nicotine  21 mg Transdermal Daily   oxyCODONE  10 mg Oral Q6H   sodium chloride flush  10-40 mL  Intracatheter Q12H   traZODone  150 mg Oral QHS   Continuous Infusions:  [START ON 07/14/2021] vancomycin     vancomycin     PRN Meds:.acetaminophen **OR** acetaminophen, albuterol, ALPRAZolam, hydrocortisone cream, influenza vac split quadrivalent PF, ondansetron **OR** ondansetron (ZOFRAN) IV, oxyCODONE **AND** oxyCODONE, sodium chloride flush    Objective: Weight change:   Intake/Output Summary (Last 24 hours) at 07/13/2021 1702 Last data filed at 07/13/2021 1200 Gross per 24 hour  Intake 480 ml  Output --  Net 480 ml    Blood pressure 112/72, pulse 84, temperature 98.5 F (36.9 C), temperature source Oral, resp. rate 16, height 5\' 2"  (1.575 m), weight 59 kg, SpO2 98 %. Temp:  [98.3 F (36.8 C)-98.5 F (36.9 C)] 98.5 F (36.9 C) (11/04 0519) Pulse Rate:  [75-84] 84 (11/04 0519) Resp:  [16] 16 (11/03 1953) BP: (107-118)/(72-75) 112/72 (11/04 0519) SpO2:  [98 %-100 %] 98 % (11/04 0519)  Physical Exam: Physical Exam Constitutional:      General: She is not in acute distress.    Appearance: She is well-developed.  She is not diaphoretic.  HENT:     Head: Normocephalic and atraumatic.     Right Ear: External ear normal.     Left Ear: External ear normal.     Mouth/Throat:     Pharynx: No oropharyngeal exudate.  Eyes:     General: No scleral icterus.    Conjunctiva/sclera: Conjunctivae normal.     Pupils: Pupils are equal, round, and reactive to light.  Cardiovascular:     Rate and Rhythm: Normal rate and regular rhythm.     Heart sounds: Normal heart sounds. No murmur heard.   No friction rub. No gallop.  Pulmonary:     Effort: Pulmonary effort is normal. No respiratory distress.     Breath sounds: Normal breath sounds. No wheezing or rales.  Abdominal:     General: Bowel sounds are normal. There is no distension.     Palpations: Abdomen is soft.     Tenderness: There is no abdominal tenderness. There is no rebound.  Musculoskeletal:        General: No tenderness. Normal range of motion.  Lymphadenopathy:     Cervical: No cervical adenopathy.  Skin:    General: Skin is warm and dry.     Coloration: Skin is not pale.     Findings: No erythema or rash.  Neurological:     General: No focal deficit present.     Mental Status: She is alert and oriented to person, place, and time.     Motor: No abnormal muscle tone.     Coordination: Coordination normal.  Psychiatric:        Mood and Affect: Mood is anxious. Affect is tearful.        Behavior: Behavior normal.        Thought Content: Thought content normal.        Judgment: Judgment normal.     CBC:    BMET Recent Labs    07/11/21 0150  NA 134*  K 4.1  CL 101  CO2 25  GLUCOSE 97  BUN 10  CREATININE 0.56  CALCIUM 8.5*       Liver Panel  No results for input(s): PROT, ALBUMIN, AST, ALT, ALKPHOS, BILITOT, BILIDIR, IBILI in the last 72 hours.     Sedimentation Rate No results for input(s): ESRSEDRATE in the last 72 hours.  C-Reactive Protein No results for input(s): CRP in the last 72  hours.  Micro  Results: Recent Results (from the past 720 hour(s))  Blood culture (routine x 2)     Status: Abnormal   Collection Time: 06/29/21  9:54 AM   Specimen: BLOOD RIGHT HAND  Result Value Ref Range Status   Specimen Description BLOOD RIGHT HAND  Final   Special Requests   Final    BOTTLES DRAWN AEROBIC ONLY Blood Culture results may not be optimal due to an inadequate volume of blood received in culture bottles   Culture  Setup Time   Final    GRAM POSITIVE COCCI AEROBIC BOTTLE ONLY CRITICAL RESULT CALLED TO, READ BACK BY AND VERIFIED WITH: G,BARR PHARMD @1338  06/30/21 EB Performed at [facility], [place] [address], [place], [place] [address]    Culture METHICILLIN RESISTANT STAPHYLOCOCCUS AUREUS (A)  Final   Report Status 07/02/2021 FINAL  Final   Organism ID, Bacteria METHICILLIN RESISTANT STAPHYLOCOCCUS AUREUS  Final      Susceptibility   Methicillin resistant staphylococcus aureus - MIC*    CIPROFLOXACIN <=0.5 SENSITIVE Sensitive     ERYTHROMYCIN >=8 RESISTANT Resistant     GENTAMICIN <=0.5 SENSITIVE Sensitive     OXACILLIN >=4 RESISTANT Resistant     TETRACYCLINE <=1 SENSITIVE Sensitive     VANCOMYCIN 1 SENSITIVE Sensitive     TRIMETH/SULFA <=10 SENSITIVE Sensitive     CLINDAMYCIN <=0.25 SENSITIVE Sensitive     RIFAMPIN <=0.5 SENSITIVE Sensitive     Inducible Clindamycin NEGATIVE Sensitive     * METHICILLIN RESISTANT STAPHYLOCOCCUS AUREUS  Blood Culture ID Panel (Reflexed)     Status: Abnormal   Collection Time: 06/29/21  9:54 AM  Result Value Ref Range Status   Enterococcus faecalis NOT DETECTED NOT DETECTED Final   Enterococcus Faecium NOT DETECTED NOT DETECTED Final   Listeria monocytogenes NOT DETECTED NOT DETECTED Final   Staphylococcus species DETECTED (A) NOT DETECTED Final    Comment: CRITICAL RESULT CALLED TO, READ BACK BY AND VERIFIED WITH: G,BARR PHARMD @1338  06/30/21 EB    Staphylococcus aureus (BCID) DETECTED (A) NOT DETECTED Final    Comment: Methicillin  (oxacillin)-resistant Staphylococcus aureus (MRSA). MRSA is predictably resistant to beta-lactam antibiotics (except ceftaroline). Preferred therapy is vancomycin unless clinically contraindicated. Patient requires contact precautions if  hospitalized. CRITICAL RESULT CALLED TO, READ BACK BY AND VERIFIED WITH: G,BARR PHARMD @1338  06/30/21 EB    Staphylococcus epidermidis NOT DETECTED NOT DETECTED Final   Staphylococcus lugdunensis NOT DETECTED NOT DETECTED Final   Streptococcus species NOT DETECTED NOT DETECTED Final   Streptococcus agalactiae NOT DETECTED NOT DETECTED Final   Streptococcus pneumoniae NOT DETECTED NOT DETECTED Final   Streptococcus pyogenes NOT DETECTED NOT DETECTED Final   A.calcoaceticus-baumannii NOT DETECTED NOT DETECTED Final   Bacteroides fragilis NOT DETECTED NOT DETECTED Final   Enterobacterales NOT DETECTED NOT DETECTED Final   Enterobacter cloacae complex NOT DETECTED NOT DETECTED Final   Escherichia coli NOT DETECTED NOT DETECTED Final   Klebsiella aerogenes NOT DETECTED NOT DETECTED Final   Klebsiella oxytoca NOT DETECTED NOT DETECTED Final   Klebsiella pneumoniae NOT DETECTED NOT DETECTED Final   Proteus species NOT DETECTED NOT DETECTED Final   Salmonella species NOT DETECTED NOT DETECTED Final   Serratia marcescens NOT DETECTED NOT DETECTED Final   Haemophilus influenzae NOT DETECTED NOT DETECTED Final   Neisseria meningitidis NOT DETECTED NOT DETECTED Final   Pseudomonas aeruginosa NOT DETECTED NOT DETECTED Final   Stenotrophomonas maltophilia NOT DETECTED NOT DETECTED Final   Candida albicans NOT DETECTED NOT DETECTED  Final   Candida auris NOT DETECTED NOT DETECTED Final   Candida glabrata NOT DETECTED NOT DETECTED Final   Candida krusei NOT DETECTED NOT DETECTED Final   Candida parapsilosis NOT DETECTED NOT DETECTED Final   Candida tropicalis NOT DETECTED NOT DETECTED Final   Cryptococcus neoformans/gattii NOT DETECTED NOT DETECTED Final   Meth  resistant mecA/C and MREJ DETECTED (A) NOT DETECTED Final    Comment: CRITICAL RESULT CALLED TO, READ BACK BY AND VERIFIED WITH: G,BARR PHARMD @1338  06/30/21 EB Performed at [facility] [address] [place], [place] [address]   Resp Panel by RT-PCR (Flu A&B, Covid) Nasopharyngeal Swab     Status: None   Collection Time: 06/29/21  9:55 AM   Specimen: Nasopharyngeal Swab; Nasopharyngeal(NP) swabs in vial transport medium  Result Value Ref Range Status   SARS Coronavirus 2 by RT PCR NEGATIVE NEGATIVE Final    Comment: (NOTE) SARS-CoV-2 target nucleic acids are NOT DETECTED.  The SARS-CoV-2 RNA is generally detectable in upper respiratory specimens during the acute phase of infection. The lowest concentration of SARS-CoV-2 viral copies this assay can detect is 138 copies/mL. A negative result does not preclude SARS-Cov-2 infection and should not be used as the sole basis for treatment or other patient management decisions. A negative result may occur with  improper specimen collection/handling, submission of specimen other than nasopharyngeal swab, presence of viral mutation(s) within the areas targeted by this assay, and inadequate number of viral copies(<138 copies/mL). A negative result must be combined with clinical observations, patient history, and epidemiological information. The expected result is Negative.  Fact Sheet for Patients:  EntrepreneurPulse.com.au  Fact Sheet for Healthcare Providers:  IncredibleEmployment.be  This test is no t yet approved or cleared by the [place] FDA and  has been authorized for detection and/or diagnosis of SARS-CoV-2 by FDA under an Emergency Use Authorization (EUA). This EUA will remain  in effect (meaning this test can be used) for the duration of the COVID-19 declaration under Section 564(b)(1) of the Act, 21 U.S.C.section 360bbb-3(b)(1), unless the authorization is terminated  or revoked  sooner.       Influenza A by PCR NEGATIVE NEGATIVE Final   Influenza B by PCR NEGATIVE NEGATIVE Final    Comment: (NOTE) The Xpert Xpress SARS-CoV-2/FLU/RSV plus assay is intended as an aid in the diagnosis of influenza from Nasopharyngeal swab specimens and should not be used as a sole basis for treatment. Nasal washings and aspirates are unacceptable for Xpert Xpress SARS-CoV-2/FLU/RSV testing.  Fact Sheet for Patients: EntrepreneurPulse.com.au  Fact Sheet for Healthcare Providers: IncredibleEmployment.be  This test is not yet approved or cleared by the [place] FDA and has been authorized for detection and/or diagnosis of SARS-CoV-2 by FDA under an Emergency Use Authorization (EUA). This EUA will remain in effect (meaning this test can be used) for the duration of the COVID-19 declaration under Section 564(b)(1) of the Act, 21 U.S.C. section 360bbb-3(b)(1), unless the authorization is terminated or revoked.  Performed at [facility], [place] [address] [place], [place] [address]   Blood culture (routine x 2)     Status: Abnormal   Collection Time: 06/29/21  3:15 PM   Specimen: BLOOD  Result Value Ref Range Status   Specimen Description BLOOD BLOOD LEFT FOREARM  Final   Special Requests   Final    BOTTLES DRAWN AEROBIC AND ANAEROBIC Blood Culture adequate volume   Culture  Setup Time   Final    GRAM POSITIVE  COCCI ANAEROBIC BOTTLE ONLY CRITICAL VALUE NOTED.  VALUE IS CONSISTENT WITH PREVIOUSLY REPORTED AND CALLED VALUE.    Culture (A)  Final    STAPHYLOCOCCUS AUREUS SUSCEPTIBILITIES PERFORMED ON PREVIOUS CULTURE WITHIN THE LAST 5 DAYS. Performed at [facility], [place] [address], [place], [place] [address]    Report Status 07/03/2021 FINAL  Final  Culture, blood (routine x 2)     Status: Abnormal   Collection Time: 06/30/21  6:50 PM   Specimen: BLOOD  Result Value Ref Range Status   Specimen Description BLOOD LEFT  ANTECUBITAL  Final   Special Requests   Final    BOTTLES DRAWN AEROBIC ONLY Blood Culture results may not be optimal due to an inadequate volume of blood received in culture bottles   Culture  Setup Time   Final    GRAM POSITIVE COCCI IN CLUSTERS AEROBIC BOTTLE ONLY CRITICAL VALUE NOTED.  VALUE IS CONSISTENT WITH PREVIOUSLY REPORTED AND CALLED VALUE.    Culture (A)  Final    STAPHYLOCOCCUS AUREUS SUSCEPTIBILITIES PERFORMED ON PREVIOUS CULTURE WITHIN THE LAST 5 DAYS. Performed at [facility], [place] [address], [place], [place] [address]    Report Status 07/02/2021 FINAL  Final  Culture, blood (routine x 2)     Status: Abnormal   Collection Time: 06/30/21  6:55 PM   Specimen: BLOOD  Result Value Ref Range Status   Specimen Description BLOOD LEFT ANTECUBITAL  Final   Special Requests   Final    BOTTLES DRAWN AEROBIC ONLY Blood Culture results may not be optimal due to an inadequate volume of blood received in culture bottles   Culture  Setup Time   Final    GRAM POSITIVE COCCI IN CLUSTERS AEROBIC BOTTLE ONLY CRITICAL VALUE NOTED.  VALUE IS CONSISTENT WITH PREVIOUSLY REPORTED AND CALLED VALUE.    Culture (A)  Final    STAPHYLOCOCCUS AUREUS SUSCEPTIBILITIES PERFORMED ON PREVIOUS CULTURE WITHIN THE LAST 5 DAYS. Performed at [facility], [place] [address] [name], [place] [address]    Report Status 07/02/2021 FINAL  Final  Culture, blood (routine x 2)     Status: Abnormal (Preliminary result)   Collection Time: 07/02/21  8:50 AM   Specimen: BLOOD  Result Value Ref Range Status   Specimen Description BLOOD SITE NOT SPECIFIED  Final   Special Requests IN PEDIATRIC BOTTLE Blood Culture adequate volume  Final   Culture  Setup Time   Final    GRAM POSITIVE COCCI AEROBIC BOTTLE ONLY CRITICAL VALUE NOTED.  VALUE IS CONSISTENT WITH PREVIOUSLY REPORTED AND CALLED VALUE.    Culture (A)  Final    STAPHYLOCOCCUS AUREUS Sent to [place] for further susceptibility testing. Performed  at [facility], [place] [address] [place], [place] [address]    Report Status PENDING  Incomplete  Culture, blood (routine x 2)     Status: None   Collection Time: 07/02/21 11:50 AM   Specimen: BLOOD RIGHT FOREARM  Result Value Ref Range Status   Specimen Description BLOOD RIGHT FOREARM  Final   Special Requests   Final    BOTTLES DRAWN AEROBIC AND ANAEROBIC Blood Culture adequate volume   Culture   Final    NO GROWTH 5 DAYS Performed at [facility], [place] [address], [place],  [address]    Report Status 07/07/2021 FINAL  Final  Culture, blood (routine x 2)     Status: Abnormal   Collection Time: 07/03/21  8:06 PM  Specimen: BLOOD  Result Value Ref Range Status   Specimen Description BLOOD RIGHT ARM  Final   Special Requests   Final    BOTTLES DRAWN AEROBIC AND ANAEROBIC Blood Culture adequate volume   Culture  Setup Time   Final    GRAM POSITIVE COCCI IN CLUSTERS ANAEROBIC BOTTLE ONLY CRITICAL VALUE NOTED.  VALUE IS CONSISTENT WITH PREVIOUSLY REPORTED AND CALLED VALUE.    Culture (A)  Final    STAPHYLOCOCCUS AUREUS SUSCEPTIBILITIES PERFORMED ON PREVIOUS CULTURE WITHIN THE LAST 5 DAYS. Performed at [facility], [place] [address], [place], [place] [address]    Report Status 07/07/2021 FINAL  Final  Culture, blood (routine x 2)     Status: Abnormal   Collection Time: 07/03/21  8:06 PM   Specimen: BLOOD  Result Value Ref Range Status   Specimen Description BLOOD LEFT ARM  Final   Special Requests   Final    BOTTLES DRAWN AEROBIC AND ANAEROBIC Blood Culture adequate volume   Culture  Setup Time   Final    GRAM POSITIVE COCCI IN CLUSTERS ANAEROBIC BOTTLE ONLY CRITICAL VALUE NOTED.  VALUE IS CONSISTENT WITH PREVIOUSLY REPORTED AND CALLED VALUE. Performed at [facility], [place] [address] [place], [place] [address]    Culture METHICILLIN RESISTANT STAPHYLOCOCCUS AUREUS (A)  Final   Report Status 07/09/2021 FINAL  Final   Organism ID, Bacteria  METHICILLIN RESISTANT STAPHYLOCOCCUS AUREUS  Final      Susceptibility   Methicillin resistant staphylococcus aureus - MIC*    CIPROFLOXACIN <=0.5 SENSITIVE Sensitive     ERYTHROMYCIN >=8 RESISTANT Resistant     GENTAMICIN <=0.5 SENSITIVE Sensitive     OXACILLIN >=4 RESISTANT Resistant     TETRACYCLINE <=1 SENSITIVE Sensitive     VANCOMYCIN 1 SENSITIVE Sensitive     TRIMETH/SULFA <=10 SENSITIVE Sensitive     CLINDAMYCIN <=0.25 SENSITIVE Sensitive     RIFAMPIN <=0.5 SENSITIVE Sensitive     Inducible Clindamycin NEGATIVE Sensitive     * METHICILLIN RESISTANT STAPHYLOCOCCUS AUREUS  Culture, blood (Routine X 2) w Reflex to ID Panel     Status: Abnormal   Collection Time: 07/05/21  7:00 PM   Specimen: BLOOD RIGHT ARM  Result Value Ref Range Status   Specimen Description BLOOD RIGHT ARM  Final   Special Requests   Final    BOTTLES DRAWN AEROBIC AND ANAEROBIC Blood Culture adequate volume   Culture  Setup Time   Final    GRAM POSITIVE COCCI IN BOTH AEROBIC AND ANAEROBIC BOTTLES CRITICAL VALUE NOTED.  VALUE IS CONSISTENT WITH PREVIOUSLY REPORTED AND CALLED VALUE.    Culture (A)  Final    STAPHYLOCOCCUS AUREUS SUSCEPTIBILITIES PERFORMED ON PREVIOUS CULTURE WITHIN THE LAST 5 DAYS. Performed at [facility], [place] [address], [place], [place] [address]    Report Status 07/09/2021 FINAL  Final  Culture, blood (Routine X 2) w Reflex to ID Panel     Status: Abnormal   Collection Time: 07/05/21  7:06 PM   Specimen: BLOOD LEFT WRIST  Result Value Ref Range Status   Specimen Description BLOOD LEFT WRIST  Final   Special Requests   Final    BOTTLES DRAWN AEROBIC AND ANAEROBIC Blood Culture results may not be optimal due to an inadequate volume of blood received in culture bottles   Culture  Setup Time   Final    GRAM POSITIVE COCCI IN BOTH AEROBIC AND ANAEROBIC BOTTLES CRITICAL VALUE NOTED.  VALUE IS CONSISTENT  WITH PREVIOUSLY REPORTED AND CALLED VALUE.    Culture (A)  Final     STAPHYLOCOCCUS AUREUS SUSCEPTIBILITIES PERFORMED ON PREVIOUS CULTURE WITHIN THE LAST 5 DAYS. Performed at [facility], [place] [address], [place], [place] [address]    Report Status 07/08/2021 FINAL  Final  Culture, blood (Routine X 2) w Reflex to ID Panel     Status: None   Collection Time: 07/07/21  2:38 PM   Specimen: BLOOD RIGHT ARM  Result Value Ref Range Status   Specimen Description BLOOD RIGHT ARM  Final   Special Requests   Final    BOTTLES DRAWN AEROBIC ONLY Blood Culture results may not be optimal due to an inadequate volume of blood received in culture bottles   Culture   Final    NO GROWTH 5 DAYS Performed at [facility], [place] [address], [place], [place] [address]    Report Status 07/12/2021 FINAL  Final  Culture, blood (Routine X 2) w Reflex to ID Panel     Status: None   Collection Time: 07/07/21  2:39 PM   Specimen: BLOOD RIGHT HAND  Result Value Ref Range Status   Specimen Description BLOOD RIGHT HAND  Final   Special Requests   Final    BOTTLES DRAWN AEROBIC ONLY Blood Culture results may not be optimal due to an inadequate volume of blood received in culture bottles   Culture   Final    NO GROWTH 5 DAYS Performed at [facility], [place] [address], [place], [place] [address]    Report Status 07/12/2021 FINAL  Final    Studies/Results: CT Angio Chest Pulmonary Embolism (PE) W or WO Contrast  Result Date: 07/13/2021 CLINICAL DATA:  50 year old female with pleuritic chest pain in the setting of septic emboli endocarditis. Evaluate for pulmonary embolism. EXAM: CT ANGIOGRAPHY CHEST WITH CONTRAST TECHNIQUE: Multidetector CT imaging of the chest was performed using the standard protocol during bolus administration of intravenous contrast. Multiplanar CT image reconstructions and MIPs were obtained to evaluate the vascular anatomy. CONTRAST:  Eighty mL Omnipaque 350, intravenous COMPARISON:  06/24/2021 FINDINGS: Cardiovascular: Satisfactory opacification of  the pulmonary arteries to the segmental level. No evidence of pulmonary embolism. Normal heart size. No pericardial effusion. Right upper extremity PICC in place with the distal tip about the cavoatrial junction. Mediastinum/Nodes: No enlarged mediastinal, hilar, or axillary lymph nodes. Thyroid gland, trachea, and esophagus demonstrate no significant findings. Lungs/Pleura: Interval development of small right and trace left pleural effusions with associated passive atelectasis. Was a attenuation pattern. Similar appearing multifocal peripheral cavitary pulmonary lesions, now globally with increased surrounding consolidative opacity. No pneumothorax. Upper Abdomen: The visualized upper abdomen is within normal limits. Musculoskeletal: No chest wall abnormality. No acute or significant osseous findings. Review of the MIP images confirms the above findings. IMPRESSION: Vascular: No evidence of pulmonary embolism. Non-Vascular: 1. Interval development of small right trace left pleural effusion with associated passive atelectasis. 2. Continued evolution of septic pulmonary emboli, now globally with increased surrounding consolidative opacifications. Ruthann Cancer, MD [facility] Radiology Electronically Signed   By: Ruthann Cancer M.D.   On: 07/13/2021 14:12   [place] EKG SITE RITE  Result Date: 07/12/2021 If Site Rite image not attached, placement could not be confirmed due to current cardiac rhythm.     Assessment/Plan:  INTERVAL HISTORY:   Her blood cultures have cleared finally and she has now been switched back to monotherapy with vancomycin.  Principal Problem:  MRSA bacteremia Active Problems:   Severe opioid use disorder ([place])   Endocarditis   Septic pulmonary embolism (HCC)   Chronic viral hepatitis C ([place])   Shoulder abscess   Calf abscess   Odynophagia   Protein-calorie malnutrition, severe    Yvette Gaines is a 50 y.o. 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  She finally cleared her blood cultures we will continue with vancomycin to complete 6 weeks of therapy from date of negative cultures.  She was telling me that she was going to be discharged to skilled nursing facility but it appears that is not the case.  She was quite willing to stay here to complete the 6 weeks of therapy.   #2 Shoulder infection: this has resolved  #3 Hepatitis C : We will be happy to treat this in the clinic.  4.  IV drug use: She was very upset about being taken off Suboxone and placed back on "pain medications he is concerned about relapse.  Definitely need a long-term plan for this.  Dr. Baxter Flattery is available for questions this clinic.  I will ask Colletta Maryland and one of my partners checking on her on Monday and then we will follow peripherally until she nears discharge.  I spent 36  minutes with the patient including  face to face counseling of the patient regarding her endocarditis and septic embolization her IV drug use, personally reviewing radiographs, updated culture data CBC BMP along with review of medical records in preparation for the visit and during the visit and in coordination of her care.        LOS: 14 days   Alcide Evener 07/13/2021, 5:02 PM

## 2021-07-13 NOTE — Progress Notes (Signed)
Nutrition Follow-up  DOCUMENTATION CODES:  Severe malnutrition in context of chronic illness  INTERVENTION:  Obtain updated weight.  Continue Ensure TID.  Continue MVI with minerals daily.  Discontinue Magic Cup TID due to shortage.  Encourage PO and supplement intake.  NUTRITION DIAGNOSIS:  Severe Malnutrition related to chronic illness, cancer and cancer related treatments as evidenced by severe fat depletion, severe muscle depletion. 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 10/25 - TEE without cardioversion 11/3 - PICC line placed  RD working remotely.  Per Epic, pt's intake has been varied, averaging about 69% over the past 6 meals. Pt documentation is limited, however.  Per records, pt continues to take Ensure.  RD to discontinue Magic Cup TID due to shortage.  Pt due for new weight. RD to order.  Continue Ensure TID and MVI with minerals.  Supplements: Ensure TID  Medications: reviewed; Pepcid, MVI with minerals, oxycodone PO QID, Xanax PRN (given twice today)  Labs: reviewed; Na 134 (L)  Diet Order:   Diet Order             Diet regular Room service appropriate? Yes; Fluid consistency: Thin  Diet effective now                  EDUCATION NEEDS:  Education needs have been addressed  Skin:  Skin Assessment: Skin Integrity Issues: Skin Integrity Issues:: Other (Comment) Other: Skin tear - L leg  Last BM:  07/13/21  Height:  Ht Readings from Last 1 Encounters:  07/03/21 5\' 2"  (1.575 m)   Weight:  Wt Readings  from Last 1 Encounters:  07/03/21 59 kg   BMI:  Body mass index is 23.78 kg/m.  Estimated Nutritional Needs:  Kcal:  2706-2376 Protein:  80-95 grams Fluid:  >1.75 L  Derrel Nip, RD, LDN (she/her/hers) Clinical Inpatient Dietitian RD Pager/After-Hours/Weekend Pager # in [place]

## 2021-07-14 ENCOUNTER — Inpatient Hospital Stay (HOSPITAL_COMMUNITY): Payer: 59

## 2021-07-14 DIAGNOSIS — I38 Endocarditis, valve unspecified: Secondary | ICD-10-CM | POA: Diagnosis not present

## 2021-07-14 DIAGNOSIS — B9562 Methicillin resistant Staphylococcus aureus infection as the cause of diseases classified elsewhere: Secondary | ICD-10-CM | POA: Diagnosis not present

## 2021-07-14 DIAGNOSIS — R7881 Bacteremia: Secondary | ICD-10-CM | POA: Diagnosis not present

## 2021-07-14 LAB — COMPREHENSIVE METABOLIC PANEL
ALT: 38 U/L (ref 0–44)
AST: 24 U/L (ref 15–41)
Albumin: 2.1 g/dL — ABNORMAL LOW (ref 3.5–5.0)
Alkaline Phosphatase: 108 U/L (ref 38–126)
Anion gap: 10 (ref 5–15)
BUN: 14 mg/dL (ref 6–20)
CO2: 25 mmol/L (ref 22–32)
Calcium: 8.6 mg/dL — ABNORMAL LOW (ref 8.9–10.3)
Chloride: 99 mmol/L (ref 98–111)
Creatinine, Ser: 0.74 mg/dL (ref 0.44–1.00)
GFR, Estimated: >60 mL/min (ref >60)
Glucose, Bld: 108 mg/dL — ABNORMAL HIGH (ref 70–99)
Potassium: 4.3 mmol/L (ref 3.5–5.1)
Sodium: 134 mmol/L — ABNORMAL LOW (ref 135–145)
Total Bilirubin: 0.5 mg/dL (ref 0.3–1.2)
Total Protein: 6.5 g/dL (ref 6.5–8.1)

## 2021-07-14 LAB — ECHOCARDIOGRAM LIMITED
Area-P 1/2: 3.03 cm2
Height: 62 in
Weight: 1594.37 oz

## 2021-07-14 LAB — CBC WITH DIFFERENTIAL/PLATELET
Abs Immature Granulocytes: 0.05 10*3/uL (ref 0.00–0.07)
Basophils Absolute: 0.1 10*3/uL (ref 0.0–0.1)
Basophils Relative: 1 %
Eosinophils Absolute: 0.3 10*3/uL (ref 0.0–0.5)
Eosinophils Relative: 3 %
HCT: 27.1 % — ABNORMAL LOW (ref 36.0–46.0)
Hemoglobin: 8.4 g/dL — ABNORMAL LOW (ref 12.0–15.0)
Immature Granulocytes: 1 %
Lymphocytes Relative: 22 %
Lymphs Abs: 2 10*3/uL (ref 0.7–4.0)
MCH: 28.1 pg (ref 26.0–34.0)
MCHC: 31 g/dL (ref 30.0–36.0)
MCV: 90.6 fL (ref 80.0–100.0)
Monocytes Absolute: 0.6 10*3/uL (ref 0.1–1.0)
Monocytes Relative: 6 %
Neutro Abs: 6.3 10*3/uL (ref 1.7–7.7)
Neutrophils Relative %: 67 %
Platelets: 556 10*3/uL — ABNORMAL HIGH (ref 150–400)
RBC: 2.99 MIL/uL — ABNORMAL LOW (ref 3.87–5.11)
RDW: 19.1 % — ABNORMAL HIGH (ref 11.5–15.5)
WBC: 9.2 10*3/uL (ref 4.0–10.5)
nRBC: 0 % (ref 0.0–0.2)

## 2021-07-14 NOTE — Progress Notes (Signed)
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    Assessment & Plan:   Principal Problem:   MRSA bacteremia Active Problems:   Severe opioid use disorder (HCC)   Endocarditis   Septic pulmonary embolism (HCC)   Chronic viral hepatitis C (HCC)   Shoulder abscess   Calf abscess   Odynophagia   Protein-calorie malnutrition, severe   Chest pain  1-MRSA bacteremia, MRSA TV endocarditis, septic emboli, recent right shoulder and right calf abscess status post I and D at [facility] consistent with tricuspid valve endocarditis. -Treated with IV antibiotic -PICC line placed.  2-Chest pain: CTA negative for PE small right trace left pleural effusion, continue evolution of septic pulmonary emboli. EKG sinus rhythm.  IVDU: Incisions from Suboxone to oxy. Stable.    Nutrition Problem: Severe Malnutrition Etiology: chronic illness, cancer and cancer related treatments    Signs/Symptoms: severe fat depletion, severe muscle depletion    Interventions: Ensure Enlive (each supplement provides 350kcal and 20 grams of protein), MVI  Estimated body mass index is 18.23 kg/m as calculated from the following:   Height as of this encounter: 5\' 2"  (1.575 m).   Weight as of this encounter: 45.2 kg.   DVT prophylaxis: Lovenox Code Status: Full code Family Communication: Care discussed with patient. Disposition Plan:  Status is: Inpatient  Remains inpatient appropriate because: Awaiting skilled nursing facility to complete antibiotic        Consultants:  ID   Subjective: She reports improvement of chest pain.  No new  complaints.  Objective: Vitals:   07/13/21 2120 07/14/21 0424 07/14/21 0428 07/14/21 0833  BP: 109/79 102/66  111/69  Pulse: 81 72  74  Resp: 16 16  18   Temp: 98.4 F (36.9 C) 98.5 F (36.9 C)  98.4 F (36.9 C)  TempSrc: Oral Oral  Oral  SpO2: 98% 98%  98%  Weight:   45.2 kg   Height:        Intake/Output Summary (Last 24 hours) at 07/14/2021 1209 Last data filed at 07/14/2021 0600 Gross per 24 hour  Intake 600 ml  Output --  Net 600 ml   Filed Weights   06/29/21 1100 07/03/21 1237 07/14/21 0428  Weight: 60.3 kg 59 kg 45.2 kg    Examination:  General exam: Appears calm and comfortable  Respiratory system: Clear to auscultation. Respiratory effort normal. Cardiovascular system: S1 & S2 heard, RRR. No JVD, murmurs, rubs, gallops or clicks. No pedal edema. Gastrointestinal system: Abdomen is nondistended, soft and nontender. No organomegaly or masses felt. Normal bowel sounds heard. Central nervous system: Alert and oriented. No focal neurological deficits. Extremities: Symmetric 5 x 5 power.   Data Reviewed: I have personally reviewed following labs and imaging studies  CBC: Recent Labs  Lab 07/11/21 0150 07/14/21 0426  WBC 7.0 9.2  NEUTROABS 4.4 6.3  HGB 8.9* 8.4*  HCT 28.4* 27.1*  MCV 89.9 90.6  PLT 505* 062*   Basic Metabolic Panel: Recent Labs  Lab 07/11/21 0150 07/14/21 0426  NA 134* 134*  K 4.1 4.3  CL 101 99  CO2 25 25  GLUCOSE 97 108*  BUN 10 14  CREATININE 0.56 0.74  CALCIUM 8.5* 8.6*  MG 2.1  --  GFR: Estimated Creatinine Clearance: 60 mL/min (by C-G formula based on SCr of 0.74 mg/dL). Liver Function Tests: Recent Labs  Lab 07/14/21 0426  AST 24  ALT 38  ALKPHOS 108  BILITOT 0.5  PROT 6.5  ALBUMIN 2.1*   No results for input(s): LIPASE, AMYLASE in the last 168 hours. No results for input(s): AMMONIA in the last 168 hours. Coagulation Profile: No results for input(s): INR, PROTIME in the last 168 hours. Cardiac Enzymes: No  results for input(s): CKTOTAL, CKMB, CKMBINDEX, TROPONINI in the last 168 hours. BNP (last 3 results) No results for input(s): PROBNP in the last 8760 hours. HbA1C: No results for input(s): HGBA1C in the last 72 hours. CBG: No results for input(s): GLUCAP in the last 168 hours. Lipid Profile: No results for input(s): CHOL, HDL, LDLCALC, TRIG, CHOLHDL, LDLDIRECT in the last 72 hours. Thyroid Function Tests: No results for input(s): TSH, T4TOTAL, FREET4, T3FREE, THYROIDAB in the last 72 hours. Anemia Panel: No results for input(s): VITAMINB12, FOLATE, FERRITIN, TIBC, IRON, RETICCTPCT in the last 72 hours. Sepsis Labs: No results for input(s): PROCALCITON, LATICACIDVEN in the last 168 hours.  Recent Results (from the past 240 hour(s))  Culture, blood (Routine X 2) w Reflex to ID Panel     Status: Abnormal   Collection Time: 07/05/21  7:00 PM   Specimen: BLOOD RIGHT ARM  Result Value Ref Range Status   Specimen Description BLOOD RIGHT ARM  Final   Special Requests   Final    BOTTLES DRAWN AEROBIC AND ANAEROBIC Blood Culture adequate volume   Culture  Setup Time   Final    GRAM POSITIVE COCCI IN BOTH AEROBIC AND ANAEROBIC BOTTLES CRITICAL VALUE NOTED.  VALUE IS CONSISTENT WITH PREVIOUSLY REPORTED AND CALLED VALUE.    Culture (A)  Final    STAPHYLOCOCCUS AUREUS SUSCEPTIBILITIES PERFORMED ON PREVIOUS CULTURE WITHIN THE LAST 5 DAYS. Performed at [facility], [place] [address], [place], [place] [address]    Report Status 07/09/2021 FINAL  Final  Culture, blood (Routine X 2) w Reflex to ID Panel     Status: Abnormal   Collection Time: 07/05/21  7:06 PM   Specimen: BLOOD LEFT WRIST  Result Value Ref Range Status   Specimen Description BLOOD LEFT WRIST  Final   Special Requests   Final    BOTTLES DRAWN AEROBIC AND ANAEROBIC Blood Culture results may not be optimal due to an inadequate volume of blood received in culture bottles   Culture  Setup Time   Final    GRAM POSITIVE  COCCI IN BOTH AEROBIC AND ANAEROBIC BOTTLES CRITICAL VALUE NOTED.  VALUE IS CONSISTENT WITH PREVIOUSLY REPORTED AND CALLED VALUE.    Culture (A)  Final    STAPHYLOCOCCUS AUREUS SUSCEPTIBILITIES PERFORMED ON PREVIOUS CULTURE WITHIN THE LAST 5 DAYS. Performed at [facility], [place] [address], [place], [place] [address]    Report Status 07/08/2021 FINAL  Final  Culture, blood (Routine X 2) w Reflex to ID Panel     Status: None   Collection Time: 07/07/21  2:38 PM   Specimen: BLOOD RIGHT ARM  Result Value Ref Range Status   Specimen Description BLOOD RIGHT ARM  Final   Special Requests   Final    BOTTLES DRAWN AEROBIC ONLY Blood Culture results may not be optimal due to an inadequate volume of blood received in culture bottles   Culture   Final    NO GROWTH 5 DAYS Performed at [facility],  [address] [place], [place] [address]    Report Status 07/12/2021 FINAL  Final  Culture, blood (Routine X 2) w Reflex to ID Panel     Status: None   Collection Time: 07/07/21  2:39 PM   Specimen: BLOOD RIGHT HAND  Result Value Ref Range Status   Specimen Description BLOOD RIGHT HAND  Final   Special Requests   Final    BOTTLES DRAWN AEROBIC ONLY Blood Culture results may not be optimal due to an inadequate volume of blood received in culture bottles   Culture   Final    NO GROWTH 5 DAYS Performed at [facility], [place] [address] [place], [place] [address]    Report Status 07/12/2021 FINAL  Final         Radiology Studies: CT Angio Chest Pulmonary Embolism (PE) W or WO Contrast  Result Date: 07/13/2021 CLINICAL DATA:  50 year old female with pleuritic chest pain in the setting of septic emboli endocarditis. Evaluate for pulmonary embolism. EXAM: CT ANGIOGRAPHY CHEST WITH CONTRAST TECHNIQUE: Multidetector CT imaging of the chest was performed using the standard protocol during bolus administration of intravenous contrast. Multiplanar CT image reconstructions and MIPs were  obtained to evaluate the vascular anatomy. CONTRAST:  Eighty mL Omnipaque 350, intravenous COMPARISON:  06/24/2021 FINDINGS: Cardiovascular: Satisfactory opacification of the pulmonary arteries to the segmental level. No evidence of pulmonary embolism. Normal heart size. No pericardial effusion. Right upper extremity PICC in place with the distal tip about the cavoatrial junction. Mediastinum/Nodes: No enlarged mediastinal, hilar, or axillary lymph nodes. Thyroid gland, trachea, and esophagus demonstrate no significant findings. Lungs/Pleura: Interval development of small right and trace left pleural effusions with associated passive atelectasis. Was a attenuation pattern. Similar appearing multifocal peripheral cavitary pulmonary lesions, now globally with increased surrounding consolidative opacity. No pneumothorax. Upper Abdomen: The visualized upper abdomen is within normal limits. Musculoskeletal: No chest wall abnormality. No acute or significant osseous findings. Review of the MIP images confirms the above findings. IMPRESSION: Vascular: No evidence of pulmonary embolism. Non-Vascular: 1. Interval development of small right trace left pleural effusion with associated passive atelectasis. 2. Continued evolution of septic pulmonary emboli, now globally with increased surrounding consolidative opacifications. Ruthann Cancer, MD [facility] Electronically Signed   By: Ruthann Cancer M.D.   On: 07/13/2021 14:12   ECHOCARDIOGRAM LIMITED  Result Date: 07/14/2021    ECHOCARDIOGRAM LIMITED REPORT   Patient Name:   Yvette Gaines Date of Exam: 07/14/2021 Medical Rec #:  [id number]    Height:       62.0 in Accession #:    [id number]   Weight:       99.6 lb Date of Birth:  Mar 13, 1971    BSA:          1.422 m Patient Age:    80 years     BP:           111/69 mmHg Patient Gender: F            HR:           75 bpm. Exam Location:  Inpatient Procedure: Limited Echo,  Limited Color Doppler and Cardiac Doppler Indications:    endocarditis  History:        Patient has prior history of Echocardiogram examinations, most                 recent 07/03/2021. Hepatitis C.  Sonographer:    [place] Referring Phys: [id number]  ALLISON L ELLIS IMPRESSIONS  1. Left ventricular ejection fraction, by estimation, is >75%. The left ventricle has hyperdynamic function.  2. Small residual vegetation noted on the anterior TV leaflet. . The tricuspid valve is abnormal. Tricuspid valve regurgitation is moderate.  3. The aortic valve is tricuspid.  4. There is mildly elevated pulmonary artery systolic pressure.  5. The inferior vena cava is normal in size with greater than 50% respiratory variability, suggesting right atrial pressure of 3 mmHg. FINDINGS  Left Ventricle: Left ventricular ejection fraction, by estimation, is >75%. The left ventricle has hyperdynamic function. The left ventricular internal cavity size was normal in size. Right Ventricle: There is mildly elevated pulmonary artery systolic pressure. The tricuspid regurgitant velocity is 3.05 m/s, and with an assumed right atrial pressure of 3 mmHg, the estimated right ventricular systolic pressure is 70.9 mmHg. Tricuspid Valve: Small residual vegetation noted on the anterior TV leaflet. The tricuspid valve is abnormal. Tricuspid valve regurgitation is moderate . No evidence of tricuspid stenosis. Aortic Valve: The aortic valve is tricuspid. Pulmonic Valve: The pulmonic valve was not well visualized. Pulmonic valve regurgitation is not visualized. No evidence of pulmonic stenosis. Venous: The inferior vena cava is normal in size with greater than 50% respiratory variability, suggesting right atrial pressure of 3 mmHg.  Diastology LV e' medial:    11.60 cm/s LV E/e' medial:  6.9 LV e' lateral:   16.40 cm/s LV E/e' lateral: 4.9  IVC IVC diam: 2.10 cm MITRAL VALVE               TRICUSPID VALVE MV Area (PHT): 3.03 cm    TR Peak grad:    37.2 mmHg MV Decel Time: 250 msec    TR Vmax:        305.00 cm/s MV E velocity: 80.50 cm/s MV A velocity: 75.10 cm/s MV E/A ratio:  1.07 Carlyle Dolly MD Electronically signed by Carlyle Dolly MD Signature Date/Time: 07/14/2021/12:08:33 PM    Final         Scheduled Meds:  busPIRone  5 mg Oral TID   Chlorhexidine Gluconate Cloth  6 each Topical Daily   citalopram  20 mg Oral Daily   enoxaparin (LOVENOX) injection  40 mg Subcutaneous Q24H   famotidine  20 mg Oral Daily   feeding supplement  237 mL Oral TID BM   ketorolac  10 mg Oral Q8H   multivitamin with minerals  1 tablet Oral Daily   nicotine  21 mg Transdermal Daily   oxyCODONE  10 mg Oral Q6H   sodium chloride flush  10-40 mL Intracatheter Q12H   traZODone  150 mg Oral QHS   Continuous Infusions:  vancomycin       LOS: 15 days    Time spent: 35 minutes.     Elmarie Shiley, MD [facility]   If 7PM-7AM, please contact night-coverage [email or web address]  07/14/2021, 12:09 PM

## 2021-07-14 NOTE — Progress Notes (Signed)
Mobility Specialist Progress Note   07/14/21 1100  Mobility  Activity Ambulated in hall  Level of Assistance Modified independent, requires aide device or extra time  Assistive Device  (IV Pole)  Distance Ambulated (ft) 1650 ft  Mobility Ambulated independently in hallway  Mobility Response Tolerated well  Mobility performed by Mobility specialist  $Mobility charge 1 Mobility   Received pt in bed having no complaints and agreeable to mobility. Asymptomatic throughout ambulation, returned back to bed w/ call bell by side and eager to d/c home.   Holland Falling Mobility Specialist Phone Number [phone]

## 2021-07-14 NOTE — Progress Notes (Signed)
  Echocardiogram 2D Echocardiogram has been performed.  Yvette Gaines 07/14/2021, 9:37 AM

## 2021-07-15 DIAGNOSIS — R7881 Bacteremia: Secondary | ICD-10-CM | POA: Diagnosis not present

## 2021-07-15 DIAGNOSIS — B9562 Methicillin resistant Staphylococcus aureus infection as the cause of diseases classified elsewhere: Secondary | ICD-10-CM | POA: Diagnosis not present

## 2021-07-15 LAB — BASIC METABOLIC PANEL
Anion gap: 7 (ref 5–15)
BUN: 15 mg/dL (ref 6–20)
CO2: 26 mmol/L (ref 22–32)
Calcium: 8.5 mg/dL — ABNORMAL LOW (ref 8.9–10.3)
Chloride: 102 mmol/L (ref 98–111)
Creatinine, Ser: 0.67 mg/dL (ref 0.44–1.00)
GFR, Estimated: >60 mL/min (ref >60)
Glucose, Bld: 111 mg/dL — ABNORMAL HIGH (ref 70–99)
Potassium: 4.1 mmol/L (ref 3.5–5.1)
Sodium: 135 mmol/L (ref 135–145)

## 2021-07-15 NOTE — Progress Notes (Signed)
Pharmacy Antibiotic Note  Yvette Gaines is a 50 y.o. 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 Patient was previously not clearing her blood cultures and was put on combination therapy with daptomycin and ceftaroline for 7 days ending today. Blood cultures from 10/29 now with no growth (final). WBC 9.2, afebrile, SCr 0.67. ECHO on 11/6 showing small residual vegetations on the anterior TV leaflet. Pharmacy has been consulted for vancomycin dosing.  Plan: -Continue vancomycin IV 1000 mg Q24H (eAUC 436.8, goal 400-600, Vd 0.72) -Monitor clinical status, blood cultures, and renal function -Obtain vancomycin peaks/troughs as clinically indicated -Will treat for 6 weeks weeks from 10/29  Height: 5\' 2"  (157.5 cm) Weight: 45.2 kg (99 lb 10.4 oz) IBW/kg (Calculated) : 50.1 kg  Temp (24hrs), Avg:98.3 F (36.8 C), Min:98.1 F (36.7 C), Max:98.6 F (37 C)  Recent Labs  Lab 07/11/21 0150 07/14/21 0426 07/15/21 0326  WBC 7.0 9.2  --   CREATININE 0.56 0.74 0.67     Estimated Creatinine Clearance: 60 mL/min (by C-G formula based on SCr of 0.67 mg/dL).    No Known Allergies  Anti-inflectives this admission: Valacyclovir 10/23 >> 10/29 Vancomycin 10/21 >> 10/28, restarting 11/4 >> Cubicin 10/28 >> 11/3 Teflaro 10/28 >> 11/3  Microbiology results: 10/21 BCx: MRSA 3/3 10/22 BCx >> MRSA 2/2 10/24 BCx >> 1/3 MRSA  10/25 Bcx >> 1/4 MRSA 10/27 Bcx >> MRSA 4/4 10/29 BCx >> negative   Thank you for allowing pharmacy to be a part of this patient's care.  Shauna Hugh, PharmD, [place]  PGY-2 Pharmacy Resident 07/15/2021 10:38 AM  Please check [email or web address] for unit-specific pharmacy phone numbers.

## 2021-07-15 NOTE — Progress Notes (Signed)
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    Assessment & Plan:   Principal Problem:   MRSA bacteremia Active Problems:   Severe opioid use disorder (HCC)   Endocarditis   Septic pulmonary embolism (HCC)   Chronic viral hepatitis C (HCC)   Shoulder abscess   Calf abscess   Odynophagia   Protein-calorie malnutrition, severe   Chest pain  1-MRSA bacteremia, MRSA TV endocarditis, septic emboli, recent right shoulder and right calf abscess status post I and D at [facility] consistent with tricuspid valve endocarditis. -Treated with IV antibiotic -PICC line placed.  2-Chest pain: CTA negative for PE small right trace left pleural effusion, continue evolution of septic pulmonary emboli. EKG sinus rhythm. Troponin negative.  ECHO: small residual vegetation TV  IVDU: She was transition from sub-oxone to Oxycodone.  No withdrawal symptoms, tolerating meds so far.  Could arrange out patient suboxone clinic out patient.   Stable.    Nutrition Problem: Severe Malnutrition Etiology: chronic illness, cancer and cancer related treatments    Signs/Symptoms: severe fat depletion, severe muscle depletion    Interventions: Ensure Enlive (each supplement provides 350kcal and 20 grams of protein), MVI  Estimated body mass index is 18.23 kg/m as calculated from the following:   Height as of this encounter: 5\' 2"  (1.575 m).   Weight as of this encounter: 45.2 kg.   DVT prophylaxis: Lovenox Code Status: Full code Family Communication: Care discussed with patient. Disposition Plan:  Status is: Inpatient  Remains inpatient appropriate because:  Awaiting skilled nursing facility to complete antibiotic        Consultants:  ID   Subjective: Chest pain stable.  Denies diarrhea, worsening pain.  Objective: Vitals:   07/14/21 1715 07/14/21 2054 07/15/21 0325 07/15/21 0835  BP: 122/78 117/80 114/69 111/66  Pulse: 77 79 71 66  Resp: 19 18 18 19   Temp: 98.4 F (36.9 C) 98.6 F (37 C) 98.2 F (36.8 C) 98.1 F (36.7 C)  TempSrc: Oral Oral Oral Oral  SpO2: 99% 97% 98% 96%  Weight:   45.2 kg   Height:        Intake/Output Summary (Last 24 hours) at 07/15/2021 1316 Last data filed at 07/15/2021 0600 Gross per 24 hour  Intake 920 ml  Output --  Net 920 ml    Filed Weights   07/03/21 1237 07/14/21 0428 07/15/21 0325  Weight: 59 kg 45.2 kg 45.2 kg    Examination:  General exam: NAD Respiratory system: CTA Cardiovascular system: S 1, S  2 RRR Gastrointestinal system: BS [resent, soft, nt Central nervous system: alert   Data Reviewed: I have personally reviewed following labs and imaging studies  CBC: Recent Labs  Lab 07/11/21 0150 07/14/21 0426  WBC 7.0 9.2  NEUTROABS 4.4 6.3  HGB 8.9* 8.4*  HCT 28.4* 27.1*  MCV 89.9 90.6  PLT 505* 556*    Basic Metabolic Panel: Recent Labs  Lab 07/11/21 0150 07/14/21 0426 07/15/21 0326  NA 134* 134* 135  K 4.1 4.3 4.1  CL 101 99 102  CO2 25 25 26   GLUCOSE 97 108* 111*  BUN 10 14 15   CREATININE 0.56 0.74 0.67  CALCIUM 8.5* 8.6* 8.5*  MG  2.1  --   --     GFR: Estimated Creatinine Clearance: 60 mL/min (by C-G formula based on SCr of 0.67 mg/dL). Liver Function Tests: Recent Labs  Lab 07/14/21 0426  AST 24  ALT 38  ALKPHOS 108  BILITOT 0.5  PROT 6.5  ALBUMIN 2.1*    No results for input(s): LIPASE, AMYLASE in the last 168 hours. No results for input(s): AMMONIA in the last 168 hours. Coagulation Profile: No results for input(s): INR, PROTIME in the last 168 hours. Cardiac Enzymes: No results for input(s): CKTOTAL, CKMB, CKMBINDEX, TROPONINI  in the last 168 hours. BNP (last 3 results) No results for input(s): PROBNP in the last 8760 hours. HbA1C: No results for input(s): HGBA1C in the last 72 hours. CBG: No results for input(s): GLUCAP in the last 168 hours. Lipid Profile: No results for input(s): CHOL, HDL, LDLCALC, TRIG, CHOLHDL, LDLDIRECT in the last 72 hours. Thyroid Function Tests: No results for input(s): TSH, T4TOTAL, FREET4, T3FREE, THYROIDAB in the last 72 hours. Anemia Panel: No results for input(s): VITAMINB12, FOLATE, FERRITIN, TIBC, IRON, RETICCTPCT in the last 72 hours. Sepsis Labs: No results for input(s): PROCALCITON, LATICACIDVEN in the last 168 hours.  Recent Results (from the past 240 hour(s))  Culture, blood (Routine X 2) w Reflex to ID Panel     Status: Abnormal   Collection Time: 07/05/21  7:00 PM   Specimen: BLOOD RIGHT ARM  Result Value Ref Range Status   Specimen Description BLOOD RIGHT ARM  Final   Special Requests   Final    BOTTLES DRAWN AEROBIC AND ANAEROBIC Blood Culture adequate volume   Culture  Setup Time   Final    GRAM POSITIVE COCCI IN BOTH AEROBIC AND ANAEROBIC BOTTLES CRITICAL VALUE NOTED.  VALUE IS CONSISTENT WITH PREVIOUSLY REPORTED AND CALLED VALUE.    Culture (A)  Final    STAPHYLOCOCCUS AUREUS SUSCEPTIBILITIES PERFORMED ON PREVIOUS CULTURE WITHIN THE LAST 5 DAYS. Performed at [facility], [place] [address], [place], [place] [address]    Report Status 07/09/2021 FINAL  Final  Culture, blood (Routine X 2) w Reflex to ID Panel     Status: Abnormal   Collection Time: 07/05/21  7:06 PM   Specimen: BLOOD LEFT WRIST  Result Value Ref Range Status   Specimen Description BLOOD LEFT WRIST  Final   Special Requests   Final    BOTTLES DRAWN AEROBIC AND ANAEROBIC Blood Culture results may not be optimal due to an inadequate volume of blood received in culture bottles   Culture  Setup Time   Final    GRAM POSITIVE COCCI IN BOTH AEROBIC AND ANAEROBIC BOTTLES CRITICAL VALUE  NOTED.  VALUE IS CONSISTENT WITH PREVIOUSLY REPORTED AND CALLED VALUE.    Culture (A)  Final    STAPHYLOCOCCUS AUREUS SUSCEPTIBILITIES PERFORMED ON PREVIOUS CULTURE WITHIN THE LAST 5 DAYS. Performed at [facility], [place] [address], [place], [place] [address]    Report Status 07/08/2021 FINAL  Final  Culture, blood (Routine X 2) w Reflex to ID Panel     Status: None   Collection Time: 07/07/21  2:38 PM   Specimen: BLOOD RIGHT ARM  Result Value Ref Range Status   Specimen Description BLOOD RIGHT ARM  Final   Special Requests   Final    BOTTLES DRAWN AEROBIC ONLY Blood Culture results may not be optimal due to an inadequate volume of blood received in culture bottles   Culture   Final  NO GROWTH 5 DAYS Performed at [facility], [place] [address] [place], [place] [address]    Report Status 07/12/2021 FINAL  Final  Culture, blood (Routine X 2) w Reflex to ID Panel     Status: None   Collection Time: 07/07/21  2:39 PM   Specimen: BLOOD RIGHT HAND  Result Value Ref Range Status   Specimen Description BLOOD RIGHT HAND  Final   Special Requests   Final    BOTTLES DRAWN AEROBIC ONLY Blood Culture results may not be optimal due to an inadequate volume of blood received in culture bottles   Culture   Final    NO GROWTH 5 DAYS Performed at [facility], [place] [address] [place], [place] [address]    Report Status 07/12/2021 FINAL  Final          Radiology Studies: CT Angio Chest Pulmonary Embolism (PE) W or WO Contrast  Result Date: 07/13/2021 CLINICAL DATA:  50 year old female with pleuritic chest pain in the setting of septic emboli endocarditis. Evaluate for pulmonary embolism. EXAM: CT ANGIOGRAPHY CHEST WITH CONTRAST TECHNIQUE: Multidetector CT imaging of the chest was performed using the standard protocol during bolus administration of intravenous contrast. Multiplanar CT image reconstructions and MIPs were obtained to evaluate the vascular anatomy. CONTRAST:   Eighty mL Omnipaque 350, intravenous COMPARISON:  06/24/2021 FINDINGS: Cardiovascular: Satisfactory opacification of the pulmonary arteries to the segmental level. No evidence of pulmonary embolism. Normal heart size. No pericardial effusion. Right upper extremity PICC in place with the distal tip about the cavoatrial junction. Mediastinum/Nodes: No enlarged mediastinal, hilar, or axillary lymph nodes. Thyroid gland, trachea, and esophagus demonstrate no significant findings. Lungs/Pleura: Interval development of small right and trace left pleural effusions with associated passive atelectasis. Was a attenuation pattern. Similar appearing multifocal peripheral cavitary pulmonary lesions, now globally with increased surrounding consolidative opacity. No pneumothorax. Upper Abdomen: The visualized upper abdomen is within normal limits. Musculoskeletal: No chest wall abnormality. No acute or significant osseous findings. Review of the MIP images confirms the above findings. IMPRESSION: Vascular: No evidence of pulmonary embolism. Non-Vascular: 1. Interval development of small right trace left pleural effusion with associated passive atelectasis. 2. Continued evolution of septic pulmonary emboli, now globally with increased surrounding consolidative opacifications. Ruthann Cancer, MD [facility] Radiology Electronically Signed   By: Ruthann Cancer M.D.   On: 07/13/2021 14:12   ECHOCARDIOGRAM LIMITED  Result Date: 07/14/2021    ECHOCARDIOGRAM LIMITED REPORT   Patient Name:   CORAL TIMME Date of Exam: 07/14/2021 Medical Rec #:  [id number]    Height:       62.0 in Accession #:    [id number]   Weight:       99.6 lb Date of Birth:  [birth year]    BSA:          1.422 m Patient Age:    51 years     BP:           111/69 mmHg Patient Gender: F            HR:           75 bpm. Exam Location:  Inpatient Procedure: Limited Echo, Limited Color Doppler and Cardiac Doppler Indications:     endocarditis  History:        Patient has prior history of Echocardiogram examinations, most                 recent 07/03/2021. Hepatitis C.  Sonographer:    Johny Chess RDCS Referring Phys: [place]  1. Left ventricular ejection fraction, by estimation, is >75%. The left ventricle has hyperdynamic function.  2. Small residual vegetation noted on the anterior TV leaflet. . The tricuspid valve is abnormal. Tricuspid valve regurgitation is moderate.  3. The aortic valve is tricuspid.  4. There is mildly elevated pulmonary artery systolic pressure.  5. The inferior vena cava is normal in size with greater than 50% respiratory variability, suggesting right atrial pressure of 3 mmHg. FINDINGS  Left Ventricle: Left ventricular ejection fraction, by estimation, is >75%. The left ventricle has hyperdynamic function. The left ventricular internal cavity size was normal in size. Right Ventricle: There is mildly elevated pulmonary artery systolic pressure. The tricuspid regurgitant velocity is 3.05 m/s, and with an assumed right atrial pressure of 3 mmHg, the estimated right ventricular systolic pressure is 22.4 mmHg. Tricuspid Valve: Small residual vegetation noted on the anterior TV leaflet. The tricuspid valve is abnormal. Tricuspid valve regurgitation is moderate . No evidence of tricuspid stenosis. Aortic Valve: The aortic valve is tricuspid. Pulmonic Valve: The pulmonic valve was not well visualized. Pulmonic valve regurgitation is not visualized. No evidence of pulmonic stenosis. Venous: The inferior vena cava is normal in size with greater than 50% respiratory variability, suggesting right atrial pressure of 3 mmHg.  Diastology LV e' medial:    11.60 cm/s LV E/e' medial:  6.9 LV e' lateral:   16.40 cm/s LV E/e' lateral: 4.9  IVC IVC diam: 2.10 cm MITRAL VALVE               TRICUSPID VALVE MV Area (PHT): 3.03 cm    TR Peak grad:   37.2 mmHg MV Decel Time: 250 msec    TR Vmax:        305.00  cm/s MV E velocity: 80.50 cm/s MV A velocity: 75.10 cm/s MV E/A ratio:  1.07 Carlyle Dolly MD Electronically signed by Carlyle Dolly MD Signature Date/Time: 07/14/2021/12:08:33 PM    Final         Scheduled Meds:  busPIRone  5 mg Oral TID   Chlorhexidine Gluconate Cloth  6 each Topical Daily   citalopram  20 mg Oral Daily   enoxaparin (LOVENOX) injection  40 mg Subcutaneous Q24H   famotidine  20 mg Oral Daily   feeding supplement  237 mL Oral TID BM   ketorolac  10 mg Oral Q8H   multivitamin with minerals  1 tablet Oral Daily   nicotine  21 mg Transdermal Daily   oxyCODONE  10 mg Oral Q6H   sodium chloride flush  10-40 mL Intracatheter Q12H   traZODone  150 mg Oral QHS   Continuous Infusions:  vancomycin 1,000 mg (07/14/21 1955)     LOS: 16 days    Time spent: 35 minutes.     Elmarie Shiley, MD [facility]   If 7PM-7AM, please contact night-coverage [email or web address]  07/15/2021, 1:16 PM

## 2021-07-15 NOTE — Progress Notes (Signed)
Mobility Specialist Progress Note   07/15/21 1659  Mobility  Activity Ambulated in hall  Level of Assistance Independent after set-up  Assistive Device  (IV Pole)  Distance Ambulated (ft) 1650 ft  Mobility Ambulated independently in hallway  Mobility Response Tolerated well  Mobility performed by Mobility specialist  $Mobility charge 1 Mobility   Received pt in bed having no complaints and agreeable to mobility. Asymptomatic throughout ambulation, returned back to bed w/ call bell by side and all needs met.  Holland Falling Mobility Specialist Phone Number [phone]

## 2021-07-16 DIAGNOSIS — B182 Chronic viral hepatitis C: Secondary | ICD-10-CM | POA: Diagnosis not present

## 2021-07-16 DIAGNOSIS — B9562 Methicillin resistant Staphylococcus aureus infection as the cause of diseases classified elsewhere: Secondary | ICD-10-CM | POA: Diagnosis not present

## 2021-07-16 DIAGNOSIS — L02419 Cutaneous abscess of limb, unspecified: Secondary | ICD-10-CM | POA: Diagnosis not present

## 2021-07-16 DIAGNOSIS — I330 Acute and subacute infective endocarditis: Secondary | ICD-10-CM | POA: Diagnosis not present

## 2021-07-16 DIAGNOSIS — R7881 Bacteremia: Secondary | ICD-10-CM | POA: Diagnosis not present

## 2021-07-16 LAB — BASIC METABOLIC PANEL
Anion gap: 7 (ref 5–15)
BUN: 16 mg/dL (ref 6–20)
CO2: 28 mmol/L (ref 22–32)
Calcium: 8.5 mg/dL — ABNORMAL LOW (ref 8.9–10.3)
Chloride: 102 mmol/L (ref 98–111)
Creatinine, Ser: 0.64 mg/dL (ref 0.44–1.00)
GFR, Estimated: >60 mL/min (ref >60)
Glucose, Bld: 92 mg/dL (ref 70–99)
Potassium: 4.3 mmol/L (ref 3.5–5.1)
Sodium: 137 mmol/L (ref 135–145)

## 2021-07-16 NOTE — Progress Notes (Signed)
[facility]  Date of Admission:  06/29/2021           Reason for visit: Follow up on MRSA bacteremia  Current antibiotics: Vancomycin  ASSESSMENT & RECOMMENDATIONS:    50 y.o. female admitted with:  #MRSA bacteremia complicated by tricuspid valve endocarditis with pulmonary septic emboli:  She was bacteremic for quite some time prior to finally clearing her blood cultures as of 07/07/2021.  She underwent repeat TTE on 11/5 that showed a small residual vegetation on the TV leaflet with moderate regurgitation.  She was treated with dual antibiotic therapy (daptomycin/ceftaroline) from 10/28-11/3 prior to being converted back to vancomycin on 11/4.  The plan is to provide her with 6 weeks of IV antibiotics from the date of her last negative blood culture to complete therapy on approximately 08/18/2021.  #Recent left shoulder and left calf abscess: Status post I&D at outside hospital.  Resolved.  #History of hepatitis C: Antibody reactive with negative HCV RNA on 10/27  #Injection drug use/opiate addiction: She was receiving Suboxone and doing well while inpatient here.  She reports previously having gone to the Suboxone clinic in [place], [place].  However, being on Suboxone was going to be prohibitive for her to discharge to SNF.  As result, she has been started back on scheduled pain medication which is upsetting to her and she is concerned regarding her risk of relapse again.  --Continue vancomycin --We will continue to follow peripherally while she is here    Principal Problem:   MRSA bacteremia Active Problems:   Severe opioid use disorder (HCC)   Endocarditis   Septic pulmonary embolism (HCC)   Chronic viral hepatitis C (HCC)   Shoulder abscess   Calf abscess   Odynophagia   Protein-calorie malnutrition, severe   Chest pain    MEDICATIONS:    Scheduled Meds:  busPIRone  5 mg Oral TID   Chlorhexidine Gluconate Cloth  6 each Topical Daily    citalopram  20 mg Oral Daily   enoxaparin (LOVENOX) injection  40 mg Subcutaneous Q24H   famotidine  20 mg Oral Daily   feeding supplement  237 mL Oral TID BM   ketorolac  10 mg Oral Q8H   multivitamin with minerals  1 tablet Oral Daily   nicotine  21 mg Transdermal Daily   oxyCODONE  10 mg Oral Q6H   sodium chloride flush  10-40 mL Intracatheter Q12H   traZODone  150 mg Oral QHS   Continuous Infusions:  vancomycin Stopped (07/15/21 2043)   PRN Meds:.acetaminophen **OR** acetaminophen, albuterol, ALPRAZolam, hydrocortisone cream, influenza vac split quadrivalent PF, ondansetron **OR** ondansetron (ZOFRAN) IV, oxyCODONE **AND** oxyCODONE, sodium chloride flush  SUBJECTIVE:   24 hour events:  No acute events  She is tolerating vancomycin and reports no new issues.  She continues to be upset and anxious about her addiction management and being taken off Suboxone.  Review of Systems  All other systems reviewed and are negative.    OBJECTIVE:   Blood pressure 118/74, pulse 79, temperature 97.7 F (36.5 C), temperature source Oral, resp. rate 18, height 5\' 2"  (1.575 m), weight 45.1 kg, SpO2 100 %. Body mass index is 18.19 kg/m.  Physical Exam Constitutional:      General: She is not in acute distress.    Appearance: She is well-developed.  HENT:     Head: Normocephalic and atraumatic.  Pulmonary:     Effort: Pulmonary effort is normal. No respiratory distress.  Abdominal:     General: There is no distension.     Palpations: Abdomen is soft.  Skin:    General: Skin is warm and dry.  Neurological:     General: No focal deficit present.     Mental Status: She is alert and oriented to person, place, and time.  Psychiatric:        Mood and Affect: Mood normal.        Behavior: Behavior normal.     Comments: She is tearful     Lab Results: Lab Results  Component Value Date   WBC 9.2 07/14/2021   HGB 8.4 (L) 07/14/2021   HCT 27.1 (L) 07/14/2021   MCV 90.6 07/14/2021    PLT 556 (H) 07/14/2021    Lab Results  Component Value Date   NA 137 07/16/2021   K 4.3 07/16/2021   CO2 28 07/16/2021   GLUCOSE 92 07/16/2021   BUN 16 07/16/2021   CREATININE 0.64 07/16/2021   CALCIUM 8.5 (L) 07/16/2021   GFRNONAA >60 07/16/2021   GFRAA >60 06/28/2015    Lab Results  Component Value Date   ALT 38 07/14/2021   AST 24 07/14/2021   ALKPHOS 108 07/14/2021   BILITOT 0.5 07/14/2021       Component Value Date/Time   CRP 4.2 (H) 07/13/2021 1604       Component Value Date/Time   ESRSEDRATE 78 (H) 07/13/2021 1604     I have reviewed the micro and lab results in Epic.  Imaging: No results found.   Imaging independently reviewed in Epic.    Raynelle Highland for Infectious Disease [facility] [phone] pager 07/16/2021, 11:17 AM  I spent greater than 35 minutes with the patient including greater than 50% of time in face to face counsel of the patient and in coordination of their care.

## 2021-07-16 NOTE — TOC Progression Note (Addendum)
Transition of Care [facility]) - Progression Note    Patient Details  Name: Yvette Gaines MRN: [id number] Date of Birth: [birth year]  Transition of Care [facility]) CM/SW [place], RN Phone Number: 07/16/2021, 9:43 AM  Clinical Narrative:    Case management mailed the patient's completed Medicaid application to [facility].  [facility] was faxed to [facility] in [place], [place] to fax # [phone].  CM completed the [facility] and clinicals were sent to multiple SNF facilities in network with [facility] insurance including   [facility] - [place] in [place], [place], and [place] in [place], [place] - message left for [place], admissions for DTP bed [facility] - spoke with Ebony Hail, [facility] admission director for possible bed offers at area SNF [facility] in [place].  CM and MSW with DTP Team will continue to follow the patient for SNF placement. Patient will need PICC line placed prior to discharge and is scheduled to receive IV Vancomycin 1000 mg IV daily through 08/23/2021.  The patient will need SNF placement or remain in the hospital until bed offer is obtained since patient has history of IV drug abuse and is not a safe discharge to home with PICC line.  07/16/2021 1324 - CM spoke with Ebony Hail, [place] with [facility] and she is unable to offer SNF placement to the patient.  CM called and left a message with Zachery Dakins, CM with [facility] to ask for assistance for SNF placement.   Expected Discharge Plan: Skilled Nursing Facility Barriers to Discharge: Ship broker, Continued Medical Work up, SNF Pending bed offer  Expected Discharge Plan and Services Expected Discharge Plan: [place] In-house Referral: PCP / Psychologist, educational, Conservation officer, nature Services: CM Consult   Living arrangements  for the past 2 months: Apartment                                       Social Determinants of Health (SDOH) Interventions    Readmission Risk Interventions No flowsheet data found.

## 2021-07-16 NOTE — NC FL2 (Signed)
[place] LEVEL OF CARE SCREENING TOOL     IDENTIFICATION  Patient Name: Yvette Gaines Birthdate: [birth year] Sex: female Admission Date (Current Location): 06/29/2021  [facility] and [place] Number:  Herbalist and Address:  [facility] [place]. [facility], [place] [address], [place], [place] [address]      Provider Number: [id number]  Attending Physician Name and Address:  Elmarie Shiley, MD  Relative Name and Phone Number:       Current Level of Care: Hospital Recommended Level of Care: [place] Prior Approval Number:    Date Approved/Denied:   PASRR Number: [id number]  Discharge Plan: SNF    Current Diagnoses: Patient Active Problem List   Diagnosis Date Noted   Chest pain    Protein-calorie malnutrition, severe 07/05/2021   Shoulder abscess    Calf abscess    Odynophagia    Septic pulmonary embolism ([place]) 06/30/2021   MRSA bacteremia 06/30/2021   Chronic viral hepatitis C ([place]) 06/30/2021   Endocarditis 06/29/2021   Vaginal discharge 05/08/2016   Mood disorder ([place]) 07/05/2015   PTSD (post-traumatic stress disorder) 07/05/2015   History of suicide attempt 07/05/2015   Tobacco abuse 07/05/2015   Asthma 07/05/2015   History of cervical cancer 07/04/2015   Anxiety and depression 04/03/2014   Severe opioid use disorder ([place]) 04/01/2014    Orientation RESPIRATION BLADDER Height & Weight     Self, Time, Situation, Place  Normal Continent Weight: 45.1 kg Height:  5\' 2"  (157.5 cm)  BEHAVIORAL SYMPTOMS/MOOD NEUROLOGICAL BOWEL NUTRITION STATUS      Continent Diet  AMBULATORY STATUS COMMUNICATION OF NEEDS Skin   Independent Verbally Normal                       Personal Care Assistance Level of Assistance  Bathing, Feeding, Dressing Bathing Assistance: Independent Feeding assistance: Independent Dressing Assistance: Independent     Functional Limitations Info  Sight, Hearing, Speech Sight Info:  Adequate Hearing Info: Adequate Speech Info: Adequate    SPECIAL CARE FACTORS FREQUENCY   (Requires IV  antibiotics daily - Vancomycin 1000 mg IV daily.)                    Contractures Contractures Info: Not present    Additional Factors Info  Code Status, Allergies, Psychotropic, Isolation Precautions Code Status Info: Full code Allergies Info: NKDA Psychotropic Info: Xanex, Buspar, Celexa, Trazodone   Isolation Precautions Info: Contact precautions for MRSA     Current Medications (07/16/2021):  This is the current hospital active medication list Current Facility-Administered Medications  Medication Dose Route Frequency Provider Last Rate Last Admin   acetaminophen (TYLENOL) tablet 650 mg  650 mg Oral Q6H PRN Wynetta Fines T, MD   650 mg at 07/13/21 8338   Or   acetaminophen (TYLENOL) suppository 650 mg  650 mg Rectal Q6H PRN Wynetta Fines T, MD       albuterol (PROVENTIL) (2.5 MG/3ML) 0.083% nebulizer solution 2.5 mg  2.5 mg Inhalation Q6H PRN Wynetta Fines T, MD       ALPRAZolam Duanne Moron) tablet 0.25 mg  0.25 mg Oral TID PRN Elgergawy, Silver Huguenin, MD   0.25 mg at 07/15/21 2219   busPIRone (BUSPAR) tablet 5 mg  5 mg Oral TID Samella Parr, NP   5 mg at 07/15/21 2215   Chlorhexidine Gluconate Cloth 2 % PADS 6 each  6 each Topical Daily Regalado, Belkys A, MD   6  each at 07/15/21 0921   citalopram (CELEXA) tablet 20 mg  20 mg Oral Daily Erin Hearing L, NP   20 mg at 07/15/21 0921   enoxaparin (LOVENOX) injection 40 mg  40 mg Subcutaneous Q24H Elgergawy, Silver Huguenin, MD   40 mg at 07/15/21 1940   famotidine (PEPCID) tablet 20 mg  20 mg Oral Daily Elgergawy, Silver Huguenin, MD   20 mg at 07/15/21 0921   feeding supplement (ENSURE ENLIVE / ENSURE PLUS) liquid 237 mL  237 mL Oral TID BM Barb Merino, MD   237 mL at 07/15/21 1940   hydrocortisone cream 1 %   Topical PRN Regalado, Belkys A, MD   Given at 07/12/21 0520   influenza vac split quadrivalent PF (FLUARIX) injection 0.5 mL  0.5 mL  Intramuscular Prior to discharge Elgergawy, Silver Huguenin, MD       ketorolac (TORADOL) tablet 10 mg  10 mg Oral Q8H Samella Parr, NP   10 mg at 07/16/21 0517   multivitamin with minerals tablet 1 tablet  1 tablet Oral Daily Barb Merino, MD   1 tablet at 07/15/21 0921   nicotine (NICODERM CQ - dosed in mg/24 hours) patch 21 mg  21 mg Transdermal Daily Wynetta Fines T, MD   21 mg at 07/15/21 0922   ondansetron (ZOFRAN) tablet 4 mg  4 mg Oral Q6H PRN Wynetta Fines T, MD       Or   ondansetron [facility]) injection 4 mg  4 mg Intravenous Q6H PRN Wynetta Fines T, MD       oxyCODONE (Oxy IR/ROXICODONE) immediate release tablet 5 mg  5 mg Oral Q4H PRN Samella Parr, NP   5 mg at 07/13/21 1427   And   oxyCODONE (Oxy IR/ROXICODONE) immediate release tablet 10 mg  10 mg Oral Q6H Samella Parr, NP   10 mg at 07/16/21 0359   sodium chloride flush (NS) 0.9 % injection 10-40 mL  10-40 mL Intracatheter Q12H Regalado, Belkys A, MD   10 mL at 07/15/21 2217   sodium chloride flush (NS) 0.9 % injection 10-40 mL  10-40 mL Intracatheter PRN Regalado, Belkys A, MD   10 mL at 07/14/21 0432   traZODone (DESYREL) tablet 150 mg  150 mg Oral QHS Samella Parr, NP   150 mg at 07/15/21 2215   vancomycin (VANCOCIN) IVPB 1000 mg/200 mL premix  1,000 mg Intravenous Q24H Susa Raring, [place] at 07/15/21 2043     Discharge Medications: Please see discharge summary for a list of discharge medications.  Relevant Imaging Results:  Relevant Lab Results:   Additional Information SS - [id number], Requires IV antibiotics - Vancomycin 1000 mg IV daily through PICC line - end date 08/23/2021  Curlene Labrum, RN

## 2021-07-16 NOTE — Progress Notes (Signed)
Xanax given prn at hs per request for anxiety-effective. Pain managed with scheduled meds. Rested well throughout the night.

## 2021-07-16 NOTE — Progress Notes (Signed)
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  Assessment & Plan:   Principal Problem:   MRSA bacteremia Active Problems:   Severe opioid use disorder (HCC)   Endocarditis   Septic pulmonary embolism (HCC)   Chronic viral hepatitis C (HCC)   Shoulder abscess   Calf abscess   Odynophagia   Protein-calorie malnutrition, severe   Chest pain  1-MRSA bacteremia, MRSA TV endocarditis, septic emboli, recent right shoulder and right calf abscess status post I and D at [facility] -TEE consistent with tricuspid valve endocarditis. -Treated with IV antibiotic. -PICC line placed.  2-Chest pain: CTA negative for PE small right trace left pleural effusion, continue evolution of septic pulmonary emboli. EKG sinus rhythm. Troponin negative.  ECHO: small residual vegetation TV  IVDU: She was transition from sub-oxone to Oxycodone.  No withdrawal symptoms, tolerating meds so far.  Could arrange out patient suboxone clinic out patient.   We can at some point start decreasing oxycodone dose to facility transition as out patient to suboxone.   Nutrition Problem: Severe Malnutrition Etiology: chronic illness, cancer and cancer related treatments    Signs/Symptoms: severe fat depletion, severe muscle depletion    Interventions: Ensure Enlive (each supplement provides 350kcal and 20 grams of protein), MVI  Estimated body mass index is 18.19 kg/m as calculated from the following:   Height as of this encounter: 5\' 2"  (1.575 m).   Weight as of this encounter: 45.1 kg.   DVT prophylaxis: Lovenox Code Status: Full code Family Communication: Care discussed with  patient. Disposition Plan:  Status is: Inpatient  Remains inpatient appropriate because: Awaiting skilled nursing facility to complete antibiotic        Consultants:  ID   Subjective: She denies pain, denies diarrhea.  She used to follow with suboxone clinic. Last follow up Months prior to her relapse.  We can try to decrease oxycodone dose over time to help facility out patient transition to suboxone.   Objective: Vitals:   07/15/21 1946 07/16/21 0401 07/16/21 0408 07/16/21 0831  BP: 130/84  115/77 118/74  Pulse: 82  80 79  Resp: 18  16 18   Temp: 98.6 F (37 C)  98.2 F (36.8 C) 97.7 F (36.5 C)  TempSrc: Oral  Oral Oral  SpO2: 99%  98% 100%  Weight:  45.1 kg    Height:        Intake/Output Summary (Last 24 hours) at 07/16/2021 1540 Last data filed at 07/16/2021 0526 Gross per 24 hour  Intake 440 ml  Output --  Net 440 ml    Filed Weights   07/14/21 0428 07/15/21 0325 07/16/21 0401  Weight: 45.2 kg 45.2 kg 45.1 kg    Examination:  General exam: NAD Respiratory system: CTA Cardiovascular system: S 1, S 2 RRR Gastrointestinal system: BS present, soft, nt Central nervous system: Alert   Data Reviewed: I have personally reviewed following labs and imaging studies  CBC: Recent Labs  Lab 07/11/21 0150 07/14/21 0426  WBC 7.0 9.2  NEUTROABS 4.4 6.3  HGB 8.9* 8.4*  HCT 28.4* 27.1*  MCV 89.9 90.6  PLT 505* 556*    Basic Metabolic Panel: Recent Labs  Lab 07/11/21 0150 07/14/21 0426 07/15/21 0326 07/16/21  0347  NA 134* 134* 135 137  K 4.1 4.3 4.1 4.3  CL 101 99 102 102  CO2 25 25 26 28   GLUCOSE 97 108* 111* 92  BUN 10 14 15 16   CREATININE 0.56 0.74 0.67 0.64  CALCIUM 8.5* 8.6* 8.5* 8.5*  MG 2.1  --   --   --     GFR: Estimated Creatinine Clearance: 59.9 mL/min (by C-G formula based on SCr of 0.64 mg/dL). Liver Function Tests: Recent Labs  Lab 07/14/21 0426  AST 24  ALT 38  ALKPHOS 108  BILITOT 0.5  PROT 6.5  ALBUMIN 2.1*     No results for input(s): LIPASE, AMYLASE in the last 168 hours. No results for input(s): AMMONIA in the last 168 hours. Coagulation Profile: No results for input(s): INR, PROTIME in the last 168 hours. Cardiac Enzymes: No results for input(s): CKTOTAL, CKMB, CKMBINDEX, TROPONINI in the last 168 hours. BNP (last 3 results) No results for input(s): PROBNP in the last 8760 hours. HbA1C: No results for input(s): HGBA1C in the last 72 hours. CBG: No results for input(s): GLUCAP in the last 168 hours. Lipid Profile: No results for input(s): CHOL, HDL, LDLCALC, TRIG, CHOLHDL, LDLDIRECT in the last 72 hours. Thyroid Function Tests: No results for input(s): TSH, T4TOTAL, FREET4, T3FREE, THYROIDAB in the last 72 hours. Anemia Panel: No results for input(s): VITAMINB12, FOLATE, FERRITIN, TIBC, IRON, RETICCTPCT in the last 72 hours. Sepsis Labs: No results for input(s): PROCALCITON, LATICACIDVEN in the last 168 hours.  Recent Results (from the past 240 hour(s))  Culture, blood (Routine X 2) w Reflex to ID Panel     Status: None   Collection Time: 07/07/21  2:38 PM   Specimen: BLOOD RIGHT ARM  Result Value Ref Range Status   Specimen Description BLOOD RIGHT ARM  Final   Special Requests   Final    BOTTLES DRAWN AEROBIC ONLY Blood Culture results may not be optimal due to an inadequate volume of blood received in culture bottles   Culture   Final    NO GROWTH 5 DAYS Performed at [facility], [place] [address], [place], [place] [address]    Report Status 07/12/2021 FINAL  Final  Culture, blood (Routine X 2) w Reflex to ID Panel     Status: None   Collection Time: 07/07/21  2:39 PM   Specimen: BLOOD RIGHT HAND  Result Value Ref Range Status   Specimen Description BLOOD RIGHT HAND  Final   Special Requests   Final    BOTTLES DRAWN AEROBIC ONLY Blood Culture results may not be optimal due to an inadequate volume of blood received in culture bottles   Culture   Final    NO GROWTH 5  DAYS Performed at [facility], [place] [address], [place], [place] [address]    Report Status 07/12/2021 FINAL  Final          Radiology Studies: No results found.      Scheduled Meds:  busPIRone  5 mg Oral TID   Chlorhexidine Gluconate Cloth  6 each Topical Daily   citalopram  20 mg Oral Daily   enoxaparin (LOVENOX) injection  40 mg Subcutaneous Q24H   famotidine  20 mg Oral Daily   feeding supplement  237 mL Oral TID BM   ketorolac  10 mg Oral Q8H   multivitamin with minerals  1 tablet Oral Daily   nicotine  21 mg Transdermal Daily   oxyCODONE  10 mg  Oral Q6H   sodium chloride flush  10-40 mL Intracatheter Q12H   traZODone  150 mg Oral QHS   Continuous Infusions:  vancomycin Stopped (07/15/21 2043)     LOS: 17 days    Time spent: 35 minutes.     Elmarie Shiley, MD [facility]   If 7PM-7AM, please contact night-coverage [email or web address]  07/16/2021, 3:40 PM

## 2021-07-16 NOTE — Progress Notes (Signed)
  Mobility Specialist Criteria Algorithm Info.  Mobility Team: HOB elevated: Activity: Ambulated in hall Range of motion: Active; All extremities Level of assistance: Independent Assistive device: Other (Comment) (IV pole) Distance ambulated (ft): 1650 ft Mobility response: Tolerated well Bed Position: Semi-fowlers  Patient ambulated in hallway with independently with steady gait. Tolerated ambulation well without complaint or incident. Was in lying supine in bed with all needs met.   07/16/2021 3:24 PM

## 2021-07-17 DIAGNOSIS — R7881 Bacteremia: Secondary | ICD-10-CM | POA: Diagnosis not present

## 2021-07-17 DIAGNOSIS — B182 Chronic viral hepatitis C: Secondary | ICD-10-CM | POA: Diagnosis not present

## 2021-07-17 DIAGNOSIS — L02419 Cutaneous abscess of limb, unspecified: Secondary | ICD-10-CM | POA: Diagnosis not present

## 2021-07-17 DIAGNOSIS — I330 Acute and subacute infective endocarditis: Secondary | ICD-10-CM | POA: Diagnosis not present

## 2021-07-17 LAB — BASIC METABOLIC PANEL
Anion gap: 8 (ref 5–15)
BUN: 17 mg/dL (ref 6–20)
CO2: 27 mmol/L (ref 22–32)
Calcium: 8.5 mg/dL — ABNORMAL LOW (ref 8.9–10.3)
Chloride: 102 mmol/L (ref 98–111)
Creatinine, Ser: 0.77 mg/dL (ref 0.44–1.00)
GFR, Estimated: >60 mL/min (ref >60)
Glucose, Bld: 105 mg/dL — ABNORMAL HIGH (ref 70–99)
Potassium: 4.3 mmol/L (ref 3.5–5.1)
Sodium: 137 mmol/L (ref 135–145)

## 2021-07-17 LAB — VANCOMYCIN, PEAK: Vancomycin Pk: 33 ug/mL (ref 30–40)

## 2021-07-17 MED ORDER — BUPRENORPHINE HCL-NALOXONE HCL 8-2 MG SL SUBL: 1.5000 | SUBLINGUAL_TABLET | Freq: Every day | SUBLINGUAL | Status: DC

## 2021-07-17 MED ORDER — BUPRENORPHINE HCL-NALOXONE HCL 8-2 MG SL SUBL
1.5000 | SUBLINGUAL_TABLET | Freq: Every day | SUBLINGUAL | Status: DC
  Administered 2021-07-17 – 2021-08-07 (×22): 1.5 via SUBLINGUAL
  Filled 2021-07-17 (×2): qty 2
  Filled 2021-07-17: qty 1
  Filled 2021-07-17 (×7): qty 2
  Filled 2021-07-17 (×2): qty 1
  Filled 2021-07-17: qty 2
  Filled 2021-07-17: qty 1
  Filled 2021-07-17 (×4): qty 2
  Filled 2021-07-17: qty 1
  Filled 2021-07-17 (×5): qty 2
  Filled 2021-07-17: qty 1

## 2021-07-17 NOTE — Progress Notes (Signed)
[facility] PROGRESS NOTE  Yvette Gaines JAS:[id number] DOB: 11-07-1970 DOA: 06/29/2021 PCP: [name], DO  Status: Remains inpatient appropriate because:  Unsafe to discharge patient with history of IVDU with PICC line in place for outpatient IV therapy Patient requires a total of 6 weeks of IV antibiotics beginning 11/3  Barriers to discharge: Social: IVDU history so unable to discharge with a PICC line in place to complete antibiotic therapies in the outpatient environment/home setting  Clinical: Final blood cultures from 10/29 returned negative as of 11/3 Has done well on Suboxone therefore we will continue this medication until a bed offer obtained and an actual discharge date confirmed before transitioning over to short acting scheduled Oxy IR  Level of care:  Med-Surg   Code Status: Full Family Communication: Patient DVT prophylaxis: Lovenox COVID vaccination status: Unknown  HPI: 50 y.o. 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   Patient presented to [facility] on 10/05 for worsening of left shoulder swelling and pain.  Was found to have abscess of left shoulder and left calf,  I&D was done on 10/06 and culture showed MRSA.[name]  Patient however signed out [place] after the surgery. On 10/16, patient came back to [facility] complaining about new onset of chest pains and shortness of breath, CT chest showed multiple bilateral septic emboli, echocardiogram showed new onset of tricuspid vegetation compatible with endocarditis.  Blood culture showed again MRSA.  Patient was started on vancomycin and cefepime since. During hospital stay, patient was also found to develop acute thrombocytopenia, DIC was ruled out.  Patient was admitted to [facility], and  started on IV vancomycin, TEE with tricuspid leaflet vegetation.   Subjective: Patient alert and reporting significant anxiety with discontinuation of Suboxone in favor of Oxy IR.  Discussed plan to resume Suboxone today with patient requesting delay of resumption of this medication until 4:30 PM since she had last had a dose of Oxy IR at 4:30 AM.  Objective: Vitals:   07/16/21 1656 07/16/21 2124  BP: 130/86 (!) 143/89  Pulse: 77 79  Resp: 18 17  Temp: 98.2 F (36.8 C) 98.2 F (36.8 C)  SpO2: 100% 99%    Intake/Output Summary (Last 24 hours) at 07/17/2021 0743 Last data filed at 07/16/2021 1658 Gross per 24 hour  Intake 600 ml  Output --  Net 600 ml    Filed Weights   07/14/21 0428 07/15/21 0325 07/16/21 0401  Weight: 45.2 kg 45.2 kg 45.1 kg    Exam:  Constitutional: NAD, mildly anxious, uncomfortable secondary to anterior/pleuritic chest discomfort Respiratory: RA, lungs are CTA on anterior exam, normal respiratory effort-saturation Cardiovascular: Regular rate and rhythm, No extremity edema.  S1-S2 without audible rub, normotensive Abdomen: no tenderness. Bowel sounds positive. LBM 11/06 Neurologic: CN 2-12 grossly intact. Sensation intact, DTR normal. Strength 5/5 x all 4 extremities.  Psychiatric: Normal judgment and insight. Alert and oriented x 3.  Mildly anxious mood.    Assessment/Plan: Acute problems:   MRSA bacteremia Active Problems:   Severe opioid use disorder (HCC)   Endocarditis   Septic pulmonary embolism (HCC)   Chronic viral hepatitis C (HCC)   Shoulder abscess   Calf abscess   Odynophagia   Protein-calorie malnutrition, severe     MRSA bacteremia/MRSA TV endocarditis/septic emboli/recent right shoulder and right calf abscess Lonna Cobb  post I&D 10/6 (at [facility])  -TEE consistent with tricuspid valve endocarditis.  CT surgery documented not a candidate for angio VAC due to small size of vegetation -Not a candidate to go home with PICC line to  history of active IV narcotic abuse prior to admission -Vancomycin discontinued, narrowed to Teflaro and daptomycin by ID. -Last blood cultures obtained on 10/29 turned negative final culture on 11/3.  PICC line to be placed  11/3 -ID has narrowed to vancomycin as of 11/5  Chest pain -Sounds pleuritic in nature and no chest wall tenderness with palpation -Given history of septic emboli as well as endocarditis need to check CT chest with contrast to rule out additional emboli versus pulmonary infarct-also need to rule out PE -11/5 repeat echocardiogram: Small residual vegetation noted on the anterior TV leaflet. . The tricuspid valve is abnormal. Tricuspid valve regurgitation is moderate. -CTA chest without PE, interval development of small right trace left pleural effusion with associated passive atelectasis.  Continued evolution of septic pulmonary emboli now globally increased surrounding consolidated opacifications-add incentive spirometry -Continue oral Toradol to help with inflammatory component of pleuritic chest discomfort-follow electrolytes daily  IVDU with narcotics -Continued on Suboxone this admission recommend continue after discharge -Last date of antibiotics to do is 08/23/2021 -I have been informed by TOC that Suboxone is a barrier to dc to SNF.  Plan is to continue Suboxone for now and once bed offer secured an actual discharge date confirmed plan is to transition over to Oxy IR 10 mg every 6 hours as recommended by: Pharmacist and discontinue Suboxone with recommendation for patient to resume once discharged from SNF.  It will be patient's responsibility to contact her prior Suboxone clinic well in advance of anticipated discharge date from SNF to secure appropriate and timely appointment for follow-up.   -Previously received Suboxone at a clinic in [facility]  Depression and insomnia Continue preadmission Celexa, BuSpar and trazodone at bedtime  Patient reports  improvement in insomnia  Thrombocytopenia:  Resolved.   Recent right shoulder and calf abscess -Source of bacteremia -Status post I&D on 10/06 at [facility] -At outside hospital revealed no infectious involvement of AC joint    Severe protein calorie malnutrition Nutrition Problem: Severe Malnutrition Etiology: chronic illness and poor intake prior to admission secondary to IVDU Body mass index is 18.19 kg/m.  Signs/Symptoms: severe fat depletion, severe muscle depletion Interventions: Ensure Enlive (each supplement provides 350kcal and 20 grams of protein), Magic cup, MVI    Mild intermittent asthma/current tobacco abuse -Currently asymptomatic -Continue prn bronchodilators and nicotine patch   Normocytic anemia with iron deficiency -Secondary to chronic malnutrition -Current hemoglobin 7.8 -Transfuse for hemoglobin less than 7.      +HCV AB -ID recommended treatment after discharge. -Also ID recommended HIV preexposure prophylaxis with either oral Truvada or long-acting aptitude injectable   Blood culture data: -Blood cultures positive for MRSA on 10/21, 10/22.   -Blood cultures 1/2 positive from 10/24. -Blood cultures 1/2 positive from 10/25. -Blood cultures 2 out of 2 positive from 10/27. -Blood cultures from 10/29 and finalized on 11/3   Data Reviewed: Basic Metabolic Panel: Recent Labs  Lab 07/11/21 0150 07/14/21 0426 07/15/21 0326 07/16/21 0347 07/17/21 0423  NA 134* 134* 135 137 137  K 4.1 4.3 4.1 4.3 4.3  CL 101 99 102 102 102  CO2 25 25 26 28 27   GLUCOSE 97 108* 111* 92 105*  BUN 10 14 15 16 17   CREATININE 0.56 0.74 0.67 0.64 0.77  CALCIUM 8.5* 8.6* 8.5* 8.5* 8.5*  MG 2.1  --   --   --   --    CBC: Recent Labs  Lab 07/11/21 0150 07/14/21 0426  WBC 7.0 9.2  NEUTROABS 4.4 6.3  HGB 8.9* 8.4*  HCT 28.4* 27.1*  MCV 89.9 90.6  PLT 505* 556*        Recent Results (from the past 240 hour(s))  Culture, blood (Routine X 2) w Reflex to ID  Panel     Status: None   Collection Time: 07/07/21  2:38 PM   Specimen: BLOOD RIGHT ARM  Result Value Ref Range Status   Specimen Description BLOOD RIGHT ARM  Final   Special Requests   Final    BOTTLES DRAWN AEROBIC ONLY Blood Culture results may not be optimal due to an inadequate volume of blood received in culture bottles   Culture   Final    NO GROWTH 5 DAYS Performed at [facility], [place] [address], [place], [place] [address]    Report Status 07/12/2021 FINAL  Final  Culture, blood (Routine X 2) w Reflex to ID Panel     Status: None   Collection Time: 07/07/21  2:39 PM   Specimen: BLOOD RIGHT HAND  Result Value Ref Range Status   Specimen Description BLOOD RIGHT HAND  Final   Special Requests   Final    BOTTLES DRAWN AEROBIC ONLY Blood Culture results may not be optimal due to an inadequate volume of blood received in culture bottles   Culture   Final    NO GROWTH 5 DAYS Performed at [facility], [place] [address], [place], [place] [address]    Report Status 07/12/2021 FINAL  Final      Scheduled Meds:  busPIRone  5 mg Oral TID   Chlorhexidine Gluconate Cloth  6 each Topical Daily   citalopram  20 mg Oral Daily   enoxaparin (LOVENOX) injection  40 mg Subcutaneous Q24H   famotidine  20 mg Oral Daily   feeding supplement  237 mL Oral TID BM   ketorolac  10 mg Oral Q8H   multivitamin with minerals  1 tablet Oral Daily   nicotine  21 mg Transdermal Daily   oxyCODONE  10 mg Oral Q6H   sodium chloride flush  10-40 mL Intracatheter Q12H   traZODone  150 mg Oral QHS   Continuous Infusions:  vancomycin 1,000 mg (07/16/21 2032)    Principal Problem:   MRSA bacteremia Active Problems:   Severe opioid use disorder ([place])   Endocarditis   Septic pulmonary embolism (HCC)   Chronic viral hepatitis C ([place])   Shoulder abscess   Calf abscess   Odynophagia   Protein-calorie malnutrition, severe   Chest pain   Consultants: Infectious disease Cardiothoracic  surgery  Procedures: TTE TEE  Antibiotics: Vancomycin 10/21 through 10/28 Valacyclovir 10/23 through 10/29 Daptomycin 10/28 >> Ceftaroline 10/28 >>   Time spent: 15 minutes    Erin Hearing ANP  [facility] 7 am - 330 pm/M-F for direct patient care and secure chat Please refer to Amion for contact info 18  days

## 2021-07-17 NOTE — Progress Notes (Signed)
[facility] for Infectious Disease  Date of Admission:  06/29/2021           Reason for visit: Follow up on MRSA bacteremia  Current antibiotics: Vancomycin  ASSESSMENT:    50 y.o. 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  Repeat TTE on 11/5 showed small residual vegetation on the tricuspid valve leaflet with moderate regurgitation.  She received dual antibiotic therapy with daptomycin and ceftaroline from 10/28-11/3 prior to being converted back to vancomycin on 11/4.  Tentative plan is to provide her with 6 weeks IV antibiotics from the date of her negative blood cultures to complete therapy on approximately 08/18/2021. Recent left shoulder abscess and left calf abscess: Status post I&D at outside hospital.  Resolved. History of hepatitis C: Antibody reactive with negative RNA on 10/27 indicating spontaneous clearance. History of injection drug use/opiate addiction: Restarted on Suboxone this morning.  RECOMMENDATIONS:    Continue vancomycin per pharmacy Continue lab monitoring Continue Suboxone for opiate use disorder Will continue to follow peripherally while she is here   Principal Problem:   MRSA bacteremia Active Problems:   Severe opioid use disorder (HCC)   Endocarditis   Septic pulmonary embolism (HCC)   Chronic viral hepatitis C (HCC)   Shoulder abscess   Calf abscess   Odynophagia   Protein-calorie malnutrition, severe   Chest pain    MEDICATIONS:    Scheduled Meds:  buprenorphine-naloxone  1.5 tablet Sublingual Daily   busPIRone  5 mg Oral TID   Chlorhexidine Gluconate Cloth  6 each Topical Daily   citalopram  20 mg Oral Daily   enoxaparin (LOVENOX) injection  40 mg Subcutaneous Q24H   famotidine  20 mg Oral Daily   feeding supplement  237 mL Oral TID BM   ketorolac   10 mg Oral Q8H   multivitamin with minerals  1 tablet Oral Daily   nicotine  21 mg Transdermal Daily   sodium chloride flush  10-40 mL Intracatheter Q12H   traZODone  150 mg Oral QHS   Continuous Infusions:  vancomycin 1,000 mg (07/16/21 2032)   PRN Meds:.acetaminophen **OR** acetaminophen, albuterol, ALPRAZolam, hydrocortisone cream, influenza vac split quadrivalent PF, ondansetron **OR** ondansetron (ZOFRAN) IV, sodium chloride flush  SUBJECTIVE:   24 hour events:  No acute events noted  She is happy today now that she has been resumed on her Suboxone.  She currently has no new complaints.  Review of Systems  All other systems reviewed and are negative.    OBJECTIVE:   Blood pressure 119/83, pulse 70, temperature 97.7 F (36.5 C), temperature source Oral, resp. rate 17, height 5\' 2"  (1.575 m), weight 45.1 kg, SpO2 100 %. Body mass index is 18.19 kg/m.  Physical Exam Constitutional:      General: She is not in acute distress.    Appearance: Normal appearance.  HENT:     Head: Normocephalic and atraumatic.  Eyes:     Extraocular Movements: Extraocular movements intact.     Conjunctiva/sclera: Conjunctivae normal.  Pulmonary:     Effort: Pulmonary effort is normal. No respiratory distress.  Musculoskeletal:     Cervical back: Normal range of motion and neck supple.  Neurological:     General: No focal deficit present.     Mental Status: She is alert and oriented to person, place, and time.  Psychiatric:  Mood and Affect: Mood normal.        Behavior: Behavior normal.     Lab Results: Lab Results  Component Value Date   WBC 9.2 07/14/2021   HGB 8.4 (L) 07/14/2021   HCT 27.1 (L) 07/14/2021   MCV 90.6 07/14/2021   PLT 556 (H) 07/14/2021    Lab Results  Component Value Date   NA 137 07/17/2021   K 4.3 07/17/2021   CO2 27 07/17/2021   GLUCOSE 105 (H) 07/17/2021   BUN 17 07/17/2021   CREATININE 0.77 07/17/2021   CALCIUM 8.5 (L) 07/17/2021   GFRNONAA  >60 07/17/2021   GFRAA >60 06/28/2015    Lab Results  Component Value Date   ALT 38 07/14/2021   AST 24 07/14/2021   ALKPHOS 108 07/14/2021   BILITOT 0.5 07/14/2021       Component Value Date/Time   CRP 4.2 (H) 07/13/2021 1604       Component Value Date/Time   ESRSEDRATE 78 (H) 07/13/2021 1604     I have reviewed the micro and lab results in Epic.  Imaging: No results found.   Imaging  independently reviewed in Epic.    [facility] for Infectious [facility] [phone] pager 07/17/2021, 11:21 AM  I spent greater than 35 minutes with the patient including greater than 50% of time in face to face counsel of the patient and in coordination of their care.

## 2021-07-17 NOTE — TOC Progression Note (Signed)
Transition of Care [facility]) - Progression Note    Patient Details  Name: Yvette Gaines MRN: [id number] Date of Birth: 1971/09/05  Transition of Care [facility]) CM/SW [place], RN Phone Number: 07/17/2021, 3:02 PM  Clinical Narrative:    Case management called and left a message with [facility] program [phone] to see if the patient would be appropriate for their program for 24/7 care for Inpatient Drug treatment program along with required IV administration of antibiotics for the next 6 weeks through 08/23/2021 per infectious disease recommendations.  CM will follow up with the [facility] program.   Expected Discharge Plan: [place] Barriers to Discharge: Ship broker, Continued Medical Work up, SNF Pending bed offer  Expected Discharge Plan and Services Expected Discharge Plan: [place] In-house Referral: PCP / Psychologist, educational, Conservation officer, nature Services: CM Consult   Living arrangements for the past 2 months: Apartment                                       Social Determinants of Health (SDOH) Interventions    Readmission Risk Interventions No flowsheet data found.

## 2021-07-17 NOTE — Progress Notes (Signed)
Mobility Specialist Criteria Algorithm Info.  Mobility Team: HOB elevated: Activity: Ambulated in hall; Dangled on edge of bed Range of motion: Active; All extremities Level of assistance: Independent Assistive device: Other (Comment) (IV Pole) Distance ambulated (ft): 1650 ft Mobility response: Tolerated well Bed Position: Semi-fowlers  Patient ambulated in hallway independently with steady gait. Tolerated ambulation well without complaint or incident. Was dangling EOB with all needs met.    07/17/2021 4:30 PM

## 2021-07-18 DIAGNOSIS — B9562 Methicillin resistant Staphylococcus aureus infection as the cause of diseases classified elsewhere: Secondary | ICD-10-CM | POA: Diagnosis not present

## 2021-07-18 DIAGNOSIS — R7881 Bacteremia: Secondary | ICD-10-CM | POA: Diagnosis not present

## 2021-07-18 LAB — BASIC METABOLIC PANEL
Anion gap: 9 (ref 5–15)
BUN: 19 mg/dL (ref 6–20)
CO2: 29 mmol/L (ref 22–32)
Calcium: 8.7 mg/dL — ABNORMAL LOW (ref 8.9–10.3)
Chloride: 100 mmol/L (ref 98–111)
Creatinine, Ser: 0.74 mg/dL (ref 0.44–1.00)
GFR, Estimated: >60 mL/min (ref >60)
Glucose, Bld: 102 mg/dL — ABNORMAL HIGH (ref 70–99)
Potassium: 4.4 mmol/L (ref 3.5–5.1)
Sodium: 138 mmol/L (ref 135–145)

## 2021-07-18 LAB — VANCOMYCIN, TROUGH: Vancomycin Tr: 7 ug/mL — ABNORMAL LOW (ref 15–20)

## 2021-07-18 NOTE — TOC Progression Note (Signed)
Transition of Care [facility]) - Progression Note    Patient Details  Name: Pranavi Aure MRN: [id number] Date of Birth: Apr 22, 1971  Transition of Care [facility]) CM/SW [place], RN Phone Number: 07/18/2021, 9:32 AM  Clinical Narrative:    CM spoke with the Coordinator for the [facility] program in [place], [place], and unfortunately the rehabilitation facility is not open the patients outside of the state and is not in network with the patient's insurance provide.  CM and MSW with DTP Team will continue to follow the patient for TOC needs.   Expected Discharge Plan: Skilled Nursing Facility Barriers to Discharge: Ship broker, Continued Medical Work up, SNF Pending bed offer  Expected Discharge Plan and Services Expected Discharge Plan: [place] In-house Referral: PCP / Psychologist, educational, Conservation officer, nature Services: CM Consult   Living arrangements for the past 2 months: Apartment                                       Social Determinants of Health (SDOH) Interventions    Readmission Risk Interventions No flowsheet data found.

## 2021-07-18 NOTE — Progress Notes (Signed)
[facility] PROGRESS NOTE  Yvette Gaines OZD:[id number] DOB: 1970-12-24 DOA: 06/29/2021 PCP: [name], DO  Status: Remains inpatient appropriate because:  Unsafe to discharge patient with history of IVDU with PICC line in place for outpatient IV therapy Patient requires a total of 6 weeks of IV antibiotics with blood cultures have as of 11/3 and last dose due 12/10  Barriers to discharge: Social: IVDU history so unable to discharge with a PICC line in place to complete antibiotic therapies in the outpatient environment/home setting  Clinical: Final blood cultures from 10/29 returned negative as of 11/3 Has done well on Suboxone therefore we will continue this medication until a bed offer obtained and an actual discharge date confirmed before transitioning over to short acting scheduled Oxy IR  Level of care:  Med-Surg   Code Status: Full Family Communication: Patient DVT prophylaxis: Lovenox COVID vaccination status: Unknown  HPI: 50 y.o. female with medical history significant of mild intermittent asthma, recently diagnosed MRSA on tricuspid valve with bilateral pulmonary emboli on 06/24/2021 at [facility], she left [place] 10/21, and came to [facility] seeking treatment.   Patient presented to [facility] on 10/05 for worsening of left shoulder swelling and pain.  Was found to have abscess of left shoulder and left calf,  I&D was done on 10/06 and culture showed MRSA.[name]  Patient however signed out [place] after the surgery. On 10/16, patient came back to [facility] complaining about new onset of chest pains and shortness of breath, CT chest showed multiple bilateral septic emboli, echocardiogram showed new onset of tricuspid vegetation compatible with endocarditis.  Blood culture showed again MRSA.  Patient was started on vancomycin and cefepime since. During hospital stay, patient was also found to develop acute thrombocytopenia, DIC was ruled out.   Patient was admitted to [facility], and started on IV vancomycin, TEE with tricuspid leaflet vegetation.   Subjective: Alert and sitting in bed.  In much better spirits today with resolution of previous anxiety.  Objective: Vitals:   07/17/21 2035 07/18/21 0502  BP: (!) 146/94 117/83  Pulse: 79 71  Resp: 18 19  Temp: 98.6 F (37 C) 98.2 F (36.8 C)  SpO2: 100% 99%    Intake/Output Summary (Last 24 hours) at 07/18/2021 0745 Last data filed at 07/18/2021 0503 Gross per 24 hour  Intake 880 ml  Output --  Net 880 ml    Filed Weights   07/15/21 0325 07/16/21 0401 07/18/21 0502  Weight: 45.2 kg 45.1 kg 46.7 kg    Exam:  Constitutional: NAD, anxiety resolved. Respiratory: Lung sounds are clear to auscultation without any increased work of breathing, stable on room air. Cardiovascular: Normal heart sounds, regular pulse, normotensive, no peripheral edema Abdomen: no tenderness. Bowel sounds positive. LBM 11/06 Neurologic: CN 2-12 grossly intact. Sensation intact, DTR normal. Strength 5/5 x all 4 extremities.  Psychiatric: Normal judgment and insight. Alert and oriented x 3.  Pleasant and interactive mood   Assessment/Plan: Acute problems:   MRSA bacteremia Active Problems:   Severe opioid use disorder (HCC)   Endocarditis   Septic pulmonary embolism (HCC)   Chronic viral hepatitis C (HCC)   Shoulder abscess   Calf abscess   Odynophagia   Protein-calorie malnutrition, severe     MRSA bacteremia/MRSA TV endocarditis/septic emboli/recent right shoulder and right calf abscess /status post I&D 10/6 (at [facility])  -TEE: tricuspid valve endocarditis.  CT surgery: not a candidate for angio VAC due to small size of vegetation -  Vancomycin discontinued, narrowed to Teflaro and daptomycin by ID. -Last blood cultures obtained on 10/29- ID rec OK to place PICC on 11/3 -Narrowed to vancomycin 11/5-LD due 12/10  Pleuritic chest pain -11/5 repeat echocardiogram: Small  residual vegetation noted on the anterior TV leaflet. Tricuspid valve regurgitation moderate. -CTA chest : Continued evolution of septic pulmonary emboli now globally increased surrounding consolidated opacifications -Continue oral Toradol to help with inflammatory component of pleuritic chest discomfort-follow electrolytes daily  IVDU with narcotics -Continued on Suboxone this admission -I have been informed by TOC that Suboxone is a barrier to dc to SNF.  Plan is to continue Suboxone for now and once bed offer secured and actual discharge date confirmed plan to transition to Oxy IR 10 mg every 6 hours as recommended by Pharmacist and discontinue Suboxone . Recommend resume Suboxone once discharged from SNF.  It will be patient's responsibility to contact her prior Suboxone clinic well in advance of anticipated discharge date from SNF to secure appropriate and timely appointment for follow-up.   -Previously received Suboxone at a clinic in [facility]  Depression and insomnia Continue preadmission Celexa, BuSpar and trazodone at bedtime  Patient reports improvement in insomnia  Thrombocytopenia:  Resolved.   Recent right shoulder and calf abscess -Source of bacteremia -Status post I&D on 10/06 at [place] at outside hospital revealed no infectious involvement of AC joint    Severe protein calorie malnutrition Nutrition Problem: Severe Malnutrition Etiology: chronic illness and poor intake prior to admission secondary to IVDU Body mass index is 18.83 kg/m.  Signs/Symptoms: severe fat depletion, severe muscle depletion Interventions: Ensure Enlive (each supplement provides 350kcal and 20 grams of protein), Magic cup, MVI    Mild intermittent asthma/current tobacco abuse -Currently asymptomatic -Continue prn bronchodilators and nicotine patch   Normocytic anemia with iron deficiency -Secondary to chronic malnutrition -Current hemoglobin 7.8 -Transfuse for hemoglobin  less than 7.      +HCV AB -ID recommended treatment after discharge. -Also ID recommended HIV preexposure prophylaxis with either oral Truvada or long-acting aptitude injectable   Blood culture data: -Blood cultures positive for MRSA on 10/21, 10/22.   -Blood cultures 1/2 positive from 10/24. -Blood cultures 1/2 positive from 10/25. -Blood cultures 2 out of 2 positive from 10/27. -Blood cultures from 10/29 and finalized on 11/3   Data Reviewed: Basic Metabolic Panel: Recent Labs  Lab 07/14/21 0426 07/15/21 0326 07/16/21 0347 07/17/21 0423 07/18/21 0318  NA 134* 135 137 137 138  K 4.3 4.1 4.3 4.3 4.4  CL 99 102 102 102 100  CO2 25 26 28 27 29   GLUCOSE 108* 111* 92 105* 102*  BUN 14 15 16 17 19   CREATININE 0.74 0.67 0.64 0.77 0.74  CALCIUM 8.6* 8.5* 8.5* 8.5* 8.7*   CBC: Recent Labs  Lab 07/14/21 0426  WBC 9.2  NEUTROABS 6.3  HGB 8.4*  HCT 27.1*  MCV 90.6  PLT 556*        Scheduled Meds:  buprenorphine-naloxone  1.5 tablet Sublingual Daily   busPIRone  5 mg Oral TID   Chlorhexidine Gluconate Cloth  6 each Topical Daily   citalopram  20 mg Oral Daily   enoxaparin (LOVENOX) injection  40 mg Subcutaneous Q24H   famotidine  20 mg Oral Daily   feeding supplement  237 mL Oral TID BM   ketorolac  10 mg Oral Q8H   multivitamin with minerals  1 tablet Oral Daily   nicotine  21 mg Transdermal Daily   sodium  chloride flush  10-40 mL Intracatheter Q12H   traZODone  150 mg Oral QHS   Continuous Infusions:  vancomycin 1,000 mg (07/17/21 1935)    Principal Problem:   MRSA bacteremia Active Problems:   Severe opioid use disorder ([place])   Endocarditis   Septic pulmonary embolism (HCC)   Chronic viral hepatitis C ([place])   Shoulder abscess   Calf abscess   Odynophagia   Protein-calorie malnutrition, severe   Chest pain   Consultants: Infectious disease Cardiothoracic surgery  Procedures: TTE TEE  Antibiotics: Vancomycin 10/21 through  10/28 Valacyclovir 10/23 through 10/29 Daptomycin 10/28 >> 11/05 Ceftaroline 10/28 >> 11/05 Vancomycin 11/05 >>   Time spent: 15 minutes    Erin Hearing ANP  [facility] 7 am - 330 pm/M-F for direct patient care and secure chat Please refer to Amion for contact info 19  days

## 2021-07-18 NOTE — Progress Notes (Addendum)
Pharmacy Antibiotic Note  Yvette Gaines is a 50 y.o. 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 Patient was previously not clearing her blood cultures and was put on combination therapy with daptomycin and ceftaroline for 7 days ending 11/3. Blood cultures from 10/29 with no growth (final). ECHO on 11/6 showed small residual vegetations on the anterior TV leaflet. Pharmacy was consulted for vancomycin dosing.  Steady-state vancomycin levels drawn after 4th dose of vancomycin 1000 mg IV Q 24 hrs regimen (last dose at 1935 PM yesterday) were as follows: Vancomycin peak: 33 mg/L (drawn at 2225 PM yesterday) Vancomycin trough: 7 mg/L (drawn at 1943 PM today) Vancomycin AUC calculated from the above levels was 443.5, which is within the vancomycin AUC goal range of 400-550  WBC 9.2, afebrile; Scr 0.72, CrCl 62 mL/min (renal function stable)  Plan: Continue vancomycin IV 1000 mg Q24H  Monitor WBC, temp, clinical course, renal function, vancomycin levels Plan to treat for 6 weeks weeks from 10/29 (date of first negative bld cx)  Height: 5\' 2"  (157.5 cm) Weight: 46.7 kg (102 lb 15.3 oz) IBW/kg (Calculated) : 50.1 kg  Temp (24hrs), Avg:98.2 F (36.8 C), Min:97.8 F (36.6 C), Max:98.6 F (37 C)  Recent Labs  Lab 07/14/21 0426 07/15/21 0326 07/16/21 0347 07/17/21 0423 07/17/21 2225 07/18/21 0318  WBC 9.2  --   --   --   --   --   CREATININE 0.74 0.67 0.64 0.77  --  0.74  VANCOPEAK  --   --   --   --  33  --      Estimated Creatinine Clearance: 62 mL/min (by C-G formula based on SCr of 0.74 mg/dL).    No Known Allergies  Anti-inflectives this admission: Valacyclovir 10/23 >> 10/29 Vancomycin 10/21 >> 10/28, restarted 11/4 >> Daptomycin 10/28 >> 11/3 Ceftaroline 10/28 >> 11/3  Microbiology results: 10/21 BCx: MRSA 3/3 10/22 BCx >> MRSA 2/2 10/24 BCx >> 1/3 MRSA  10/25 Bcx >> 1/4 MRSA 10/27 Bcx >> MRSA 4/4 10/29 BCx >> NG/final 10/21 Hepatitis A, Hepatitis B, HIV  serologies: negative 10/21 Hepatitis C antibody: reactive 10/27 HCV quant: >50 IU/ml  Thank you for allowing pharmacy to be a part of this patient's care.  Gillermina Hu, PharmD, BCPS, [facility] Clinical Pharmacist 07/18/2021 5:11 PM

## 2021-07-18 NOTE — Progress Notes (Signed)
This chaplain met the Pt. in the unit hall and walked with the Pt. to her room for F/U spiritual care.    The chaplain listened reflectively as the Pt. shared the story of her admission since the last time we talked.  The chaplain understands it is important to the Pt. to stay on a path of healing and freedom from addiction. The Pt. is grateful for her growing relationship with God through journaling and prayer. The Pt. is looking for a virtual faith community for worship.    The Pt. accepted the chaplain's invitation for prayer and F/U spiritual care.  Chaplain Sallyanne Kuster [phone]

## 2021-07-18 NOTE — Progress Notes (Signed)
Nutrition Follow-up  DOCUMENTATION CODES:   Severe malnutrition in context of chronic illness  INTERVENTION:   Continue Multivitamin w/ minerals daily  Continue Ensure Enlive po TID, each supplement provides 350 kcal and 20 grams of protein  Encourage good PO intake   NUTRITION DIAGNOSIS:   Severe Malnutrition related to chronic illness, cancer and cancer related treatments as evidenced by severe fat depletion, severe muscle depletion. - Ongoing  GOAL:   Patient will meet greater than or equal to 90% of their needs - Progressing   MONITOR:   PO intake, Supplement acceptance, Labs, Weight trends, Skin, I & O's  REASON FOR ASSESSMENT:   Malnutrition Screening Tool    ASSESSMENT:   50 yo female with a PMH of mild intermittent asthma, cancer, recently diagnosed MRSA on tricuspid valve with bilateral pulmonary emboli on 06/24/2021 at [facility], she left [facility] 10/21, and came to [facility] seeking treatment.   Pt reports that her appetite is improving and that she is eating well. States that she will drink 2-3 Ensures/day; typically 1 in the morning and 1 at night before going to bed because she was waking up hungry in the middle of the night. Pt reports that she has been ordering dinner for 6:30 pm because she would be hungry around 8 pm. PLDN offered to order snacks for pt; pt declined at this time and says that the unit provides her with some when needed. Pt reports no difficulty ordering meals; pt inquired about when she did not want to pick something what she could ask for. PLDN informed pt that she would ask for a house tray and she would get the menu item for that meal. Pt expressed understanding.  Per EMR, pt intake includes:  11/5 - Breakfast 100%, Lunch 75% 11/6 - Breakfast 85%, Lunch 100% 11/7 - Breakfast 100%, Lunch 50%  Pt with no other concerns or questions at this time.   Medications reviewed and include: Pepcid, MVI, IV antibiotics Labs reviewed.  Diet  Order:   Diet Order             Diet regular Room service appropriate? Yes; Fluid consistency: Thin  Diet effective now                   EDUCATION NEEDS:   Education needs have been addressed  Skin:  Skin Assessment: Skin Integrity Issues: Skin Integrity Issues:: Other (Comment) Other: Skin tear - L leg  Last BM:  07/15/2021  Height:   Ht Readings from Last 1 Encounters:  07/03/21 5\' 2"  (1.575 m)    Weight:   Wt Readings from Last 1 Encounters:  07/18/21 46.7 kg    Ideal Body Weight:  50 kg  BMI:  Body mass index is 18.83 kg/m.  Estimated Nutritional Needs:   Kcal:  1700-1900  Protein:  85-100 grams  Fluid:  > 1.7 L    Taziyah Iannuzzi BS, PLDN Clinical Dietitian See AMiON for contact information.

## 2021-07-18 NOTE — Progress Notes (Signed)
Mobility Specialist Criteria Algorithm Info.  Mobility Team: [facility] elevated:Self regulated Activity: Ambulated in hall Range of motion: Active; All extremities Level of assistance: Independent Assistive device: None Distance ambulated (ft): 1650 ft Mobility response: Tolerated well Bed Position: Semi-fowlers  Patient ambulated in hallway independently with steady gait. Tolerated ambulation well without complaint or incident. Was left lying supine in bed with all needs met.    07/18/2021 4:13 PM

## 2021-07-19 DIAGNOSIS — R7881 Bacteremia: Secondary | ICD-10-CM | POA: Diagnosis not present

## 2021-07-19 DIAGNOSIS — B9562 Methicillin resistant Staphylococcus aureus infection as the cause of diseases classified elsewhere: Secondary | ICD-10-CM | POA: Diagnosis not present

## 2021-07-19 MED ORDER — IBUPROFEN 400 MG PO TABS
400.0000 mg | ORAL_TABLET | Freq: Four times a day (QID) | ORAL | Status: DC | PRN
  Administered 2021-07-19 – 2021-08-06 (×44): 400 mg via ORAL
  Filled 2021-07-19 (×3): qty 1
  Filled 2021-07-19: qty 2
  Filled 2021-07-19 (×40): qty 1

## 2021-07-19 MED ORDER — CETIRIZINE HCL 5 MG/5ML PO SOLN
5.0000 mg | Freq: Every day | ORAL | Status: DC
  Administered 2021-07-19 – 2021-08-18 (×31): 5 mg via ORAL
  Filled 2021-07-19 (×33): qty 5

## 2021-07-19 NOTE — Progress Notes (Signed)
Mobility Specialist Progress Note:   07/19/21 1045  Mobility  Activity Ambulated in hall  Level of Assistance Independent  Assistive Device None;Other (Comment) (IV Pole)  Distance Ambulated (ft) 2200 ft  Mobility Ambulated independently in hallway  Mobility Response Tolerated well  Mobility performed by Mobility specialist  $Mobility charge 1 Mobility   Met pt in hallway. Asx during ambulation.   Nelta Numbers Mobility Specialist  Phone [phone]

## 2021-07-19 NOTE — Progress Notes (Signed)
[facility] PROGRESS NOTE  Yvette Gaines XBM:[id number] DOB: Dec 04, 1970 DOA: 06/29/2021 PCP: [name], DO  Status: Remains inpatient appropriate because:  Unsafe to discharge patient with history of IVDU with PICC line in place for outpatient IV therapy Patient requires a total of 6 weeks of IV antibiotics with blood cultures have as of 11/3 and last dose due 12/10  Barriers to discharge: Social: IVDU history so unable to discharge with a PICC line in place to complete antibiotic therapies in the outpatient environment/home setting  Clinical: Final blood cultures from 10/29 returned negative as of 11/3 Has done well on Suboxone therefore we will continue this medication until a bed offer obtained and an actual discharge date confirmed before transitioning over to short acting scheduled Oxy IR  Level of care:  Med-Surg   Code Status: Full Family Communication: Patient DVT prophylaxis: Lovenox COVID vaccination status: Unknown  HPI: 50 y.o. 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   Patient presented to [facility] on 10/05 for worsening of left shoulder swelling and pain.  Was found to have abscess of left shoulder and left calf,  I&D was done on 10/06 and culture showed MRSA.[name]  Patient however signed out [place] after the surgery. On 10/16, patient came back to [facility] complaining about new onset of chest pains and shortness of breath, CT chest showed multiple bilateral septic emboli, echocardiogram showed new onset of tricuspid vegetation compatible with endocarditis.  Blood culture showed again MRSA.  Patient was started on vancomycin and cefepime since. During hospital stay, patient was also found to develop acute thrombocytopenia, DIC was ruled out.   Patient was admitted to [facility], and started on IV vancomycin, TEE with tricuspid leaflet vegetation.   Subjective: Complaining of headache primarily frontal related for at least 2 days and worse upon awakening.  Also with sinus congestion and clear rhinorrhea.  Patient requesting the addition of ibuprofen to her pain regimen since Tylenol is not helping.  Objective: Vitals:   07/18/21 2024 07/19/21 0436  BP: 137/88 120/64  Pulse: 78 72  Resp: 17 17  Temp: 98.4 F (36.9 C) 98 F (36.7 C)  SpO2: 99% 100%    Intake/Output Summary (Last 24 hours) at 07/19/2021 0758 Last data filed at 07/18/2021 1738 Gross per 24 hour  Intake 480 ml  Output --  Net 480 ml    Filed Weights   07/16/21 0401 07/18/21 0502 07/19/21 0436  Weight: 45.1 kg 46.7 kg 48.3 kg    Exam:  Constitutional: NAD, alert Respiratory: Posterior lung sounds are clear to auscultation, stable on room air, no increased work of breathing Cardiovascular: Heart sounds S1-S2 without any obvious murmurs or rubs, normotensive, no peripheral edema Abdomen: no tenderness. Bowel sounds positive.  Eating well.  LBM 11/09 Neurologic: CN 2-12 grossly intact. Sensation intact, Strength 5/5 x all 4 extremities.  Psychiatric: Normal judgment and insight. Alert and oriented x 3.  Pleasant and interactive mood   Assessment/Plan: Acute problems:   MRSA bacteremia Active Problems:   Severe opioid use disorder (HCC)   Endocarditis   Septic pulmonary embolism (HCC)   Chronic viral hepatitis C (HCC)   Shoulder abscess   Calf abscess   Odynophagia   Protein-calorie malnutrition, severe     MRSA bacteremia/MRSA TV endocarditis/septic emboli/recent right shoulder and right calf  abscess /status post I&D 10/6 (at [facility])  -TEE: tricuspid valve endocarditis.  CT surgery: not a candidate for angio VAC due to small size of vegetation -Vancomycin discontinued, narrowed to Teflaro and daptomycin by ID. -Last blood cultures  obtained on 10/29- ID rec OK to place PICC on 11/3 -Narrowed to vancomycin 11/5-LD due 12/10  Pleuritic chest pain -11/5 repeat echocardiogram: Small residual vegetation noted on the anterior TV leaflet. Tricuspid valve regurgitation moderate. -CTA chest : Continued evolution of septic pulmonary emboli now globally increased surrounding consolidated opacifications -Continue oral Toradol to help with inflammatory component of pleuritic chest discomfort-follow electrolytes daily  Headache and sinus congestion -Patient states is similar to prior allergy symptoms so have added Zyrtec -Have also added oral ibuprofen at patient request since Tylenol ineffective -If continues to have significant sinus congestion not improved with above medications can add in Afrin  IVDU with narcotics -Continued on Suboxone this admission -I have been informed by TOC that Suboxone is a barrier to dc to SNF.  Plan is to continue Suboxone for now and once bed offer secured and actual discharge date confirmed plan to transition to Oxy IR 10 mg every 6 hours as recommended by Pharmacist and discontinue Suboxone . -Recommend resume Suboxone once discharged from SNF.  It will be patient's responsibility to contact her prior Suboxone clinic well in advance of anticipated discharge date from SNF to secure appropriate and timely appointment for follow-up.   -Previously received Suboxone at a clinic in [facility]  Depression and insomnia Continue preadmission Celexa, BuSpar and trazodone at bedtime  Patient reports improvement in insomnia  Thrombocytopenia:  Resolved.   Recent right shoulder and calf abscess -Source of bacteremia -Status post I&D on 10/06 at [place] at outside hospital revealed no infectious involvement of AC joint    Severe protein calorie malnutrition Nutrition Problem: Severe Malnutrition Etiology: chronic illness and poor intake prior to admission secondary to IVDU Body mass  index is 19.46 kg/m.  Signs/Symptoms: severe fat depletion, severe muscle depletion Interventions: Ensure Enlive (each supplement provides 350kcal and 20 grams of protein), Magic cup, MVI    Mild intermittent asthma/current tobacco abuse -Currently asymptomatic -Continue prn bronchodilators and nicotine patch   Normocytic anemia with iron deficiency -Secondary to chronic malnutrition -Current hemoglobin 7.8 -Transfuse for hemoglobin less than 7.      +HCV AB -ID recommended treatment after discharge. -Also ID recommended HIV preexposure prophylaxis with either oral Truvada or long-acting aptitude injectable   Blood culture data: -Blood cultures positive for MRSA on 10/21, 10/22.   -Blood cultures 1/2 positive from 10/24. -Blood cultures 1/2 positive from 10/25. -Blood cultures 2 out of 2 positive from 10/27. -Blood cultures from 10/29 and finalized on 11/3   Data Reviewed: Basic Metabolic Panel: Recent Labs  Lab 07/14/21 0426 07/15/21 0326 07/16/21 0347 07/17/21 0423 07/18/21 0318  NA 134* 135 137 137 138  K 4.3 4.1 4.3 4.3 4.4  CL 99 102 102 102 100  CO2 25 26 28 27 29   GLUCOSE 108* 111* 92 105* 102*  BUN 14 15 16 17 19   CREATININE 0.74 0.67 0.64 0.77 0.74  CALCIUM 8.6* 8.5* 8.5* 8.5* 8.7*   CBC: Recent Labs  Lab 07/14/21 0426  WBC 9.2  NEUTROABS 6.3  HGB 8.4*  HCT 27.1*  MCV 90.6  PLT 556*        Scheduled Meds:  buprenorphine-naloxone  1.5 tablet Sublingual Daily   busPIRone  5 mg Oral TID   Chlorhexidine Gluconate  Cloth  6 each Topical Daily   citalopram  20 mg Oral Daily   enoxaparin (LOVENOX) injection  40 mg Subcutaneous Q24H   famotidine  20 mg Oral Daily   feeding supplement  237 mL Oral TID BM   multivitamin with minerals  1 tablet Oral Daily   nicotine  21 mg Transdermal Daily   sodium chloride flush  10-40 mL Intracatheter Q12H   traZODone  150 mg Oral QHS   Continuous Infusions:  vancomycin 1,000 mg (07/18/21 2033)    Principal  Problem:   MRSA bacteremia Active Problems:   Severe opioid use disorder (HCC)   Endocarditis   Septic pulmonary embolism (HCC)   Chronic viral hepatitis C ([place])   Shoulder abscess   Calf abscess   Odynophagia   Protein-calorie malnutrition, severe   Chest pain   Consultants: Infectious disease Cardiothoracic surgery  Procedures: TTE TEE  Antibiotics: Vancomycin 10/21 through 10/28 Valacyclovir 10/23 through 10/29 Daptomycin 10/28 >> 11/05 Ceftaroline 10/28 >> 11/05 Vancomycin 11/05 >>   Time spent: 15 minutes    Erin Hearing ANP  [facility] 7 am - 330 pm/M-F for direct patient care and secure chat Please refer to Amion for contact info 20  days

## 2021-07-19 NOTE — Plan of Care (Signed)
  Problem: Nutrition: Goal: Adequate nutrition will be maintained Outcome: Progressing   Problem: Pain Managment: Goal: General experience of comfort will improve Outcome: Progressing   

## 2021-07-19 NOTE — Progress Notes (Signed)
This chaplain is present with the Pt. To F/U on the Pt. request to watch a virtual worship service in her room.    The chaplain consulted with RN-Lydia and RN-Irsaa before connecting to the Pt.'s room internet resources.  The chaplain set up the worship service and is appreciative of RN-Israa willingness to turn it off.  This chaplain will F/U with the Pt. and assess the appropriate next steps.  Chaplain Sallyanne Kuster [phone]

## 2021-07-20 DIAGNOSIS — B9562 Methicillin resistant Staphylococcus aureus infection as the cause of diseases classified elsewhere: Secondary | ICD-10-CM | POA: Diagnosis not present

## 2021-07-20 DIAGNOSIS — I2690 Septic pulmonary embolism without acute cor pulmonale: Secondary | ICD-10-CM | POA: Diagnosis not present

## 2021-07-20 DIAGNOSIS — I330 Acute and subacute infective endocarditis: Secondary | ICD-10-CM | POA: Diagnosis not present

## 2021-07-20 DIAGNOSIS — R7881 Bacteremia: Secondary | ICD-10-CM | POA: Diagnosis not present

## 2021-07-20 DIAGNOSIS — F1120 Opioid dependence, uncomplicated: Secondary | ICD-10-CM | POA: Diagnosis not present

## 2021-07-20 LAB — CULTURE, BLOOD (ROUTINE X 2): Special Requests: ADEQUATE

## 2021-07-20 NOTE — Progress Notes (Signed)
  Mobility Specialist Criteria Algorithm Info.  Mobility Team: HOB elevated: Activity: Ambulated in hall Range of motion: Active Level of assistance: Independent Assistive device: None; Other (Comment) (IV Pole) Distance ambulated (ft): 2500 ft Mobility response: Tolerated well Bed Position: No data recorded  Patient ambulated in hallway x2 sessions with mobility. Ambulated independently with steady gait. Tolerated ambulation well without complaint or incident. Was left lying supine in bed with all needs met.   07/20/2021 4:01 PM

## 2021-07-20 NOTE — Progress Notes (Signed)
[facility]  Date of Admission:  06/29/2021           Reason for visit: Follow up on MRSA bacteremia  Current antibiotics: Vancomycin  ASSESSMENT:    50 y.o. 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  She was bacteremic for several days prior to finally clearing her blood cultures as of 07/07/2021.  She underwent repeat TTE on 11/5 which showed a small residual vegetation on the tricuspid valve leaflet with moderate regurgitation.  Due to her difficulty with clearing the bacteremia she received dual antibiotic therapy with daptomycin and ceftaroline from 10/28-11/3 prior to being converted back to vancomycin monotherapy on 11/4.  Her levels have been therapeutic.  Tentative plan is to provide her with 6 weeks of IV antibiotics from the date of her negative blood cultures.  She will complete therapy on approximately 08/18/2021.  She is remaining inpatient as she is not a PICC line candidate for OP AT given her history of injecting drug use. Recent left shoulder abscess and left calf abscess: Status post I&D at outside hospital.  This has resolved. History of hepatitis C: Antibody reactive with negative RNA on 07/05/2021 indicating spontaneous clearance. History of injecting drug use/opiate addiction: She has been restarted on Suboxone this admission and is doing well.  Hopefully this can be continued as an outpatient.  RECOMMENDATIONS:    Continue vancomycin per pharmacy Continue lab monitoring Continue Suboxone for opiate use disorder Will continue to follow peripherally while she is here.  Dr. Gale Journey is available as needed over the weekend, otherwise, a new ID team will take over on Monday.   Principal Problem:   MRSA bacteremia Active Problems:   Severe opioid use disorder (HCC)   Endocarditis   Septic pulmonary embolism (HCC)   Chronic viral hepatitis C (HCC)   Shoulder abscess    Calf abscess   Odynophagia   Protein-calorie malnutrition, severe   Chest pain    MEDICATIONS:    Scheduled Meds: . buprenorphine-naloxone  1.5 tablet Sublingual Daily  . busPIRone  5 mg Oral TID  . cetirizine HCl  5 mg Oral Daily  . Chlorhexidine Gluconate Cloth  6 each Topical Daily  . citalopram  20 mg Oral Daily  . enoxaparin (LOVENOX) injection  40 mg Subcutaneous Q24H  . famotidine  20 mg Oral Daily  . feeding supplement  237 mL Oral TID BM  . multivitamin with minerals  1 tablet Oral Daily  . nicotine  21 mg Transdermal Daily  . sodium chloride flush  10-40 mL Intracatheter Q12H  . traZODone  150 mg Oral QHS   Continuous Infusions: . vancomycin 1,000 mg (07/19/21 2010)   PRN Meds:.acetaminophen **OR** acetaminophen, albuterol, ALPRAZolam, hydrocortisone cream, ibuprofen, influenza vac split quadrivalent PF, ondansetron **OR** ondansetron (ZOFRAN) IV, sodium chloride flush  SUBJECTIVE:   24 hour events:  There are no acute events noted overnight She remains afebrile over the last 24 hours Creatinine on 11/9 was 0.74 Vancomycin levels are stable on 1000 mg every 24 hours She was seen yesterday walking the halls on [address].  She is doing well and she has no complaints.  She walked the halls this morning.  No fevers, chills, nausea, vomiting, diarrhea.  She is doing well on Suboxone.  Review of Systems  All other systems reviewed and are negative.    OBJECTIVE:   Blood pressure 113/83, pulse 80, temperature 98.8 F (37.1  C), temperature source Oral, resp. rate 17, height 5\' 2"  (1.575 m), weight 48.3 kg, SpO2 98 %. Body mass index is 19.46 kg/m.  Physical Exam Constitutional:      General: She is not in acute distress.    Appearance: Normal appearance.  HENT:     Head: Normocephalic and atraumatic.  Eyes:     Extraocular Movements: Extraocular movements intact.     Conjunctiva/sclera: Conjunctivae normal.  Pulmonary:     Effort: Pulmonary effort is normal.  No respiratory distress.  Musculoskeletal:        General: Normal range of motion.  Skin:    General: Skin is warm and dry.     Findings: No rash.  Neurological:     General: No focal deficit present.     Mental Status: She is alert and oriented to person, place, and time.  Psychiatric:        Mood and Affect: Mood normal.        Behavior: Behavior normal.     Lab Results: Lab Results  Component Value Date   WBC 9.2 07/14/2021   HGB 8.4 (L) 07/14/2021   HCT 27.1 (L) 07/14/2021   MCV 90.6 07/14/2021   PLT 556 (H) 07/14/2021    Lab Results  Component Value Date   NA 138 07/18/2021   K 4.4 07/18/2021   CO2 29 07/18/2021   GLUCOSE 102 (H) 07/18/2021   BUN 19 07/18/2021   CREATININE 0.74 07/18/2021   CALCIUM 8.7 (L) 07/18/2021   GFRNONAA >60 07/18/2021   GFRAA >60 06/28/2015    Lab Results  Component Value Date   ALT 38 07/14/2021   AST 24 07/14/2021   ALKPHOS 108 07/14/2021   BILITOT 0.5 07/14/2021       Component Value Date/Time   CRP 4.2 (H) 07/13/2021 1604       Component Value Date/Time   ESRSEDRATE 78 (H) 07/13/2021 1604     I have reviewed the micro and lab results in Epic.  Imaging: No results found.   Imaging independently reviewed in Epic.    Rayn[facility] for [facility] [phone] pager 07/20/2021, 7:51 AM  I spent greater than 35 minutes with the patient including greater than 50% of time in face to face counsel of the patient and in coordination of their care.

## 2021-07-20 NOTE — Plan of Care (Signed)
  Problem: Nutrition: Goal: Adequate nutrition will be maintained Outcome: Progressing   Problem: Pain Managment: Goal: General experience of comfort will improve Outcome: Progressing   

## 2021-07-20 NOTE — Progress Notes (Signed)
[facility] PROGRESS NOTE  Yvette Gaines JXB:[id number] DOB: 04-21-1971 DOA: 06/29/2021 PCP: [name], DO  Status: Remains inpatient appropriate because:  Unsafe to discharge patient with history of IVDU with PICC line in place for outpatient IV therapy Patient requires a total of 6 weeks of IV antibiotics with blood cultures have as of 11/3 and last dose due 12/10  Barriers to discharge: Social: IVDU history so unable to discharge with a PICC line in place to complete antibiotic therapies in the outpatient environment/home setting  Clinical: Final blood cultures from 10/29 returned negative as of 11/3 Has done well on Suboxone therefore we will continue this medication until a bed offer obtained and an actual discharge date confirmed before transitioning over to short acting scheduled Oxy IR  Level of care:  Med-Surg   Code Status: Full Family Communication: Patient DVT prophylaxis: Lovenox COVID vaccination status: Unknown  HPI: 50 y.o. 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   Patient presented to [facility] on 10/05 for worsening of left shoulder swelling and pain.  Was found to have abscess of left shoulder and left calf,  I&D was done on 10/06 and culture showed MRSA.[name]  Patient however signed out [place] after the surgery. On 10/16, [facility] complaining about new onset of chest pains and shortness of breath, CT chest showed multiple bilateral septic emboli, echocardiogram showed new onset of tricuspid vegetation compatible with endocarditis.  Blood culture showed again MRSA.  Patient was started on vancomycin and cefepime since. During hospital stay, patient was also found to develop acute thrombocytopenia, DIC was ruled out.   Patient was admitted to [facility], and started on IV vancomycin, TEE with tricuspid leaflet vegetation.   Subjective: Reports headache better after utilization of ibuprofen.  No other complaints or problems noted.  Objective: Vitals:   07/19/21 2015 07/20/21 0454  BP: 127/89 113/83  Pulse: 79 80  Resp: 17 17  Temp: 98.2 F (36.8 C) 98.8 F (37.1 C)  SpO2: 99% 98%    Intake/Output Summary (Last 24 hours) at 07/20/2021 0805 Last data filed at 07/19/2021 1700 Gross per 24 hour  Intake 730 ml  Output --  Net 730 ml    Filed Weights   07/16/21 0401 07/18/21 0502 07/19/21 0436  Weight: 45.1 kg 46.7 kg 48.3 kg    Exam:  Constitutional: NAD, alert Respiratory: Lung sounds CTA, room air, normal respiratory effort Cardiovascular: S1-S2 without murmurs or rubs, normotensive, no peripheral edema Abdomen: no tenderness. Bowel sounds positive.  LBM 11/10 Neurologic: CN 2-12 grossly intact. Sensation intact, Strength 5/5 x all 4 extremities.  Psychiatric: Normal judgment and insight. Alert and oriented x 3.  Pleasant mood   Assessment/Plan: Acute problems:   MRSA bacteremia Active Problems:   Severe opioid use disorder (HCC)   Endocarditis   Septic pulmonary embolism (HCC)   Chronic viral hepatitis C (HCC)   Shoulder abscess   Calf abscess   Odynophagia   Protein-calorie malnutrition, severe     MRSA bacteremia/MRSA TV endocarditis/septic emboli/recent right shoulder and right calf abscess /status post I&D 10/6 (at [facility])  -TEE: tricuspid valve endocarditis.  CT surgery: not a candidate for angio VAC due to small size of vegetation -Vancomycin discontinued, narrowed to Teflaro and daptomycin by ID. -Last blood cultures obtained on 10/29-  ID rec OK to place PICC on 11/3 -Narrowed to vancomycin 11/5-LD due 12/10  Pleuritic chest pain -11/5 repeat echocardiogram: Small residual vegetation noted on the anterior TV leaflet. Tricuspid valve regurgitation  moderate. -CTA chest : Continued evolution of septic pulmonary emboli now globally increased surrounding consolidated opacifications  Headache and sinus congestion -Patient states is similar to prior allergy symptoms so have added Zyrtec -Have also added oral ibuprofen at patient request since Tylenol ineffective -If continues to have significant sinus congestion not improved with above medications can add in Afrin  IVDU with narcotics -Continued on Suboxone this admission -I have been informed by TOC that Suboxone is a barrier to dc to SNF.  Plan is to continue Suboxone for now and once bed offer secured and actual discharge date confirmed plan to transition to Oxy IR 10 mg every 6 hours as recommended by Pharmacist and discontinue Suboxone . -Recommend resume Suboxone once discharged from SNF.  It will be patient's responsibility to contact her prior Suboxone clinic well in advance of anticipated discharge date from SNF to secure appropriate and timely appointment for follow-up.   -Previously received Suboxone at a clinic in [facility]  Depression and insomnia Continue preadmission Celexa, BuSpar and trazodone at bedtime  Patient reports improvement in insomnia  Thrombocytopenia:  Resolved.   Recent right shoulder and calf abscess -Source of bacteremia -Status post I&D on 10/06 at [place] at outside hospital revealed no infectious involvement of AC joint    Severe protein calorie malnutrition Nutrition Problem: Severe Malnutrition Etiology: chronic illness and poor intake prior to admission secondary to IVDU Body mass index is 19.46 kg/m.  Signs/Symptoms: severe fat depletion, severe muscle depletion Interventions: Ensure Enlive (each supplement provides 350kcal and 20 grams of protein), Magic cup, MVI    Mild intermittent asthma/current tobacco abuse -Currently asymptomatic -Continue prn bronchodilators and nicotine patch   Normocytic anemia with iron  deficiency -Secondary to chronic malnutrition -Current hemoglobin 7.8 -Transfuse for hemoglobin less than 7.      +HCV AB -ID recommended treatment after discharge. -Also ID recommended HIV preexposure prophylaxis with either oral Truvada or long-acting aptitude injectable   Blood culture data: -Blood cultures positive for MRSA on 10/21, 10/22.   -Blood cultures 1/2 positive from 10/24. -Blood cultures 1/2 positive from 10/25. -Blood cultures 2 out of 2 positive from 10/27. -Blood cultures from 10/29 and finalized on 11/3   Data Reviewed: Basic Metabolic Panel: Recent Labs  Lab 07/14/21 0426 07/15/21 0326 07/16/21 0347 07/17/21 0423 07/18/21 0318  NA 134* 135 137 137 138  K 4.3 4.1 4.3 4.3 4.4  CL 99 102 102 102 100  CO2 25 26 28 27 29   GLUCOSE 108* 111* 92 105* 102*  BUN 14 15 16 17 19   CREATININE 0.74 0.67 0.64 0.77 0.74  CALCIUM 8.6* 8.5* 8.5* 8.5* 8.7*   CBC: Recent Labs  Lab 07/14/21 0426  WBC 9.2  NEUTROABS 6.3  HGB 8.4*  HCT 27.1*  MCV 90.6  PLT 556*        Scheduled Meds:  buprenorphine-naloxone  1.5 tablet Sublingual Daily   busPIRone  5 mg Oral TID   cetirizine HCl  5 mg Oral Daily   Chlorhexidine Gluconate Cloth  6 each Topical Daily   citalopram  20 mg Oral Daily   enoxaparin (LOVENOX) injection  40 mg Subcutaneous Q24H   famotidine  20 mg Oral Daily   feeding supplement  237 mL Oral TID BM   multivitamin with minerals  1 tablet Oral Daily   nicotine  21 mg Transdermal Daily   sodium chloride flush  10-40 mL Intracatheter Q12H   traZODone  150 mg Oral QHS   Continuous Infusions:  vancomycin 1,000 mg (07/19/21 2010)    Principal Problem:   MRSA bacteremia Active Problems:   Severe opioid use disorder ([place])   Endocarditis   Septic pulmonary embolism (HCC)   Chronic viral hepatitis C ([place])   Shoulder abscess   Calf abscess   Odynophagia   Protein-calorie malnutrition, severe   Chest pain   Consultants: Infectious  disease Cardiothoracic surgery  Procedures: TTE TEE  Antibiotics: Vancomycin 10/21 through 10/28 Valacyclovir 10/23 through 10/29 Daptomycin 10/28 >> 11/05 Ceftaroline 10/28 >> 11/05 Vancomycin 11/05 >>   Time spent: 15 minutes    Erin Hearing ANP  [facility] 7 am - 330 pm/M-F for direct patient care and secure chat Please refer to Amion for contact info 21  days

## 2021-07-21 DIAGNOSIS — E43 Unspecified severe protein-calorie malnutrition: Secondary | ICD-10-CM | POA: Diagnosis not present

## 2021-07-21 DIAGNOSIS — F17200 Nicotine dependence, unspecified, uncomplicated: Secondary | ICD-10-CM

## 2021-07-21 DIAGNOSIS — F1990 Other psychoactive substance use, unspecified, uncomplicated: Secondary | ICD-10-CM

## 2021-07-21 DIAGNOSIS — R7881 Bacteremia: Secondary | ICD-10-CM | POA: Diagnosis not present

## 2021-07-21 DIAGNOSIS — I079 Rheumatic tricuspid valve disease, unspecified: Secondary | ICD-10-CM

## 2021-07-21 DIAGNOSIS — L02419 Cutaneous abscess of limb, unspecified: Secondary | ICD-10-CM | POA: Diagnosis not present

## 2021-07-21 DIAGNOSIS — B182 Chronic viral hepatitis C: Secondary | ICD-10-CM | POA: Diagnosis not present

## 2021-07-21 DIAGNOSIS — D649 Anemia, unspecified: Secondary | ICD-10-CM

## 2021-07-21 NOTE — Progress Notes (Signed)
Pharmacy Antibiotic Note  Yvette Gaines is a 50 y.o. 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 Patient was previously not clearing her blood cultures and was put on combination therapy with daptomycin and ceftaroline for 7 days ending 11/3. Blood cultures from 10/29 with no growth (final). ECHO on 11/6 showed small residual vegetations on the anterior TV leaflet. Pharmacy was consulted for vancomycin dosing.  11/8 Vancomycin peak: 33 mg/L  11/9 Vancomycin trough: 7 mg/L Vancomycin AUC calculated from the above levels was 443.5, which is within the vancomycin AUC goal range of 400-550  WBC 9.2, afebrile; Scr 0.74, CrCl 60 mL/min (renal function stable)  Plan: Continue vancomycin IV 1000 mg Q24H  Monitor WBC, temp, clinical course, renal function, weekly vancomycin levels Plan to treat for 6 weeks weeks from 10/29 (date of first negative bld cx)  Height: 5\' 2"  (157.5 cm) Weight: 45.1 kg (99 lb 6.8 oz) IBW/kg (Calculated) : 50.1 kg  Temp (24hrs), Avg:98.5 F (36.9 C), Min:98 F (36.7 C), Max:98.7 F (37.1 C)  Recent Labs  Lab 07/15/21 0326 07/16/21 0347 07/17/21 0423 07/17/21 2225 07/18/21 0318 07/18/21 1943  CREATININE 0.67 0.64 0.77  --  0.74  --   VANCOTROUGH  --   --   --   --   --  7*  VANCOPEAK  --   --   --  33  --   --      Estimated Creatinine Clearance: 59.9 mL/min (by C-G formula based on SCr of 0.74 mg/dL).    No Known Allergies  Anti-inflectives this admission: Valacyclovir 10/23 >> 10/29 Vancomycin 10/21 >> 10/28, restarted 11/4 >> Daptomycin 10/28 >> 11/3 Ceftaroline 10/28 >> 11/3  Microbiology results: 10/21 BCx: MRSA 3/3 10/22 BCx >> MRSA 2/2 10/24 BCx >> 1/3 MRSA  10/25 Bcx >> 1/4 MRSA 10/27 Bcx >> MRSA 4/4 10/29 BCx >> NG/final 10/21 Hepatitis A, Hepatitis B, HIV serologies: negative 10/21 Hepatitis C antibody: reactive 10/27 HCV quant: >50 IU/ml  Thank you for allowing pharmacy to be a part of this patient's care.  Lestine Box, PharmD PGY2 Infectious Diseases Pharmacy Resident   Please check [email or web address] for unit-specific pharmacy phone numbers

## 2021-07-21 NOTE — Progress Notes (Signed)
PROGRESS NOTE  Yvette Gaines ASN:[id number] DOB: March 07, 1971   PCP: [name], DO  Patient is from: Home.  DOA: 06/29/2021 LOS: 3  Chief complaints:  Chief Complaint  Patient presents with   Chest Pain   Weakness   Shortness of Breath   Drug Problem     Brief Narrative / Interim history: 50 year old F with PMH of intermittent asthma, IVDU with opiates and recent diagnosis of MRSA bacteremia with tricuspid valve endocarditis and bilateral pulmonary septic emboli presenting with left shoulder swelling and pain.  Patient presented to [facility] on 10/5 with left shoulder and left calf swelling and pain, and underwent I&D on 10/6 but left AMA.  Deep tissue culture with MRSA.  She returned to [facility] on 10/16 with new onset chest pain and shortness of breath and found to have bilateral septic emboli.  Blood culture with MRSA.  Echo revealed kidney vegetation.  Patient left AMA again and presented to [facility] to seek treatment on 10/2.  Initially started on IV vancomycin but changed to daptomycin and ceftaroline due to persistent bacteremia.  Eventually cleared and switched back to IV vancomycin on 10/29.  She will remain in-house on IV vancomycin through 08/18/2021.   Subjective: Seen and examined earlier this morning.  No major events overnight of this morning.  No complaints.  She denies pain, chest pain, dyspnea, GI or UTI symptoms.  Objective: Vitals:   07/20/21 1606 07/20/21 2038 07/21/21 0444 07/21/21 0735  BP: (!) 149/91 129/79 122/76 120/72  Pulse: 74 79 76 82  Resp: 19 18 16 16   Temp: 98.4 F (36.9 C) 98.7 F (37.1 C) 98 F (36.7 C) 98.7 F (37.1 C)  TempSrc: Oral Oral Oral Oral  SpO2: 99% 99% 99% 99%  Weight:   45.1 kg   Height:        Intake/Output Summary (Last 24 hours) at 07/21/2021 1304 Last data filed at 07/20/2021 1840 Gross per 24 hour  Intake 880 ml  Output --  Net 880 ml   Filed Weights   07/18/21 0502 07/19/21 0436 07/21/21 0444  Weight: 46.7 kg 48.3  kg 45.1 kg    Examination:  GENERAL: No apparent distress.  Nontoxic. HEENT: MMM.  Vision and hearing grossly intact.  NECK: Supple.  No apparent JVD.  RESP: 99% on RA.  No IWOB.  Fair aeration bilaterally. CVS:  RRR. Heart sounds normal.  ABD/GI/GU: BS+. Abd soft, NTND.  MSK/EXT:  Moves extremities. No apparent deformity. No edema.  SKIN: no apparent skin lesion or wound. Dry scab on left calf.  NEURO: Awake, alert and oriented appropriately.  No apparent focal neuro deficit. PSYCH: Calm. Normal affect.   Procedures:  10/25-TEE with normal LVEF, 1.35 x 0.582 cm mobile TV vegetation  Microbiology summarized: 10/21-COVID-19 and influenza PCR nonreactive. 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 Bilateral pulmonary septic emboli Right shoulder and right calf abscess-s/p ID in [facility].  Reportedly fluid culture with MRSA.  Resolving -Vancomycin>> daptomycin and ceftaroline 10/28>> vancomycin 11/4>>> 08/18/2021 -PICC line placed on 07/12/2021 -Patient to continue IV antibiotics in-house due to IVDU   Pleuritic chest pain-likely due to the above.  Resolved.  Headache and sinus congestion: Resolved. -Continue Zyrtec and saline nose spray  IVDU with narcotics-continue Suboxone while in-house.   -Apparently, Suboxone was a barrier for discharge to SNF.  Plan is to discharge on Oxy IR 10 mg every 6 hours  when ready to leave to SNF, then follow-up at her prvious Suboxone clinic when discharged from SNF.  Depression and insomnia: Stable. -Continue home Celexa, BuSpar and trazodone at bedtime    Thrombocytopenia: Resolved.   Mild intermittent asthma/current tobacco abuse: Stable. -Encouraged tobacco cessation -Continue nicotine patch   Normocytic anemia: Anemia panel suggest this anemia of chronic disease.  H&H stable. Recent Labs    06/29/21 0924 06/30/21 0127  07/01/21 0155 07/02/21 0101 07/04/21 0135 07/11/21 0150 07/14/21 0426  HGB 8.5* 7.6* 7.1* 8.1* 7.8* 8.9* 8.4*  -Monitor H&H -Transfuse for Hgb less than 7   Positive hepatitis C antibody -ID recommended treatment after discharge.   Severe protein calorie malnutrition Body mass index is 18.19 kg/m. Nutrition Problem: Severe Malnutrition Etiology: chronic illness, cancer and cancer related treatments Signs/Symptoms: severe fat depletion, severe muscle depletion Interventions: Ensure Enlive (each supplement provides 350kcal and 20 grams of protein), MVI   DVT prophylaxis:  enoxaparin (LOVENOX) injection 40 mg Start: 06/30/21 2005 Place and maintain sequential compression device Start: 06/29/21 1318  Code Status: Full code Family Communication: Patient and/or RN. Available if any question.  Level of care: Med-Surg Status is: Inpatient  Remains inpatient appropriate because: Needs IV antibiotics for MRSA bacteremia.  Not eligible for home IV antibiotics due to IVDU   Consultants:  Infectious disease Cardiology   Sch Meds:  Scheduled Meds:  buprenorphine-naloxone  1.5 tablet Sublingual Daily   busPIRone  5 mg Oral TID   cetirizine HCl  5 mg Oral Daily   Chlorhexidine Gluconate Cloth  6 each Topical Daily   citalopram  20 mg Oral Daily   enoxaparin (LOVENOX) injection  40 mg Subcutaneous Q24H   famotidine  20 mg Oral Daily   feeding supplement  237 mL Oral TID BM   multivitamin with minerals  1 tablet Oral Daily   nicotine  21 mg Transdermal Daily   sodium chloride flush  10-40 mL Intracatheter Q12H   traZODone  150 mg Oral QHS   Continuous Infusions:  vancomycin 1,000 mg (07/20/21 2051)   PRN Meds:.acetaminophen **OR** acetaminophen, albuterol, ALPRAZolam, hydrocortisone cream, ibuprofen, influenza vac split quadrivalent PF, ondansetron **OR** ondansetron (ZOFRAN) IV, sodium chloride flush  Antimicrobials: Anti-infectives (From admission, onward)    Start      Dose/Rate Route Frequency Ordered Stop   07/14/21 2000  vancomycin (VANCOCIN) IVPB 1000 mg/200 mL premix        1,000 mg 200 mL/hr over 60 Minutes Intravenous Every 24 hours 07/12/21 1400     07/13/21 2000  vancomycin (VANCOREADY) IVPB 1250 mg/250 mL        1,250 mg 166.7 mL/hr over 90 Minutes Intravenous  Once 07/12/21 1400 07/13/21 2326   07/06/21 1400  ceftaroline (TEFLARO) 600 mg in sodium chloride 0.9 % 100 mL IVPB        600 mg 100 mL/hr over 60 Minutes Intravenous Every 8 hours 07/06/21 1033 07/12/21 2229   07/06/21 1330  DAPTOmycin (CUBICIN) 500 mg in sodium chloride 0.9 % IVPB        500 mg 120 mL/hr over 30 Minutes Intravenous Daily 07/06/21 1233 07/12/21 2022   07/01/21 2200  vancomycin (VANCOREADY) IVPB 750 mg/150 mL  Status:  Discontinued        750 mg 150 mL/hr over 60 Minutes Intravenous Every 12 hours 07/01/21 1314 07/06/21 1033   07/01/21 1400  valACYclovir (VALTREX) tablet 1,000 mg        1,000 mg Oral 2 times daily 07/01/21 1306 07/08/21 0959  06/30/21 0200  vancomycin (VANCOREADY) IVPB 750 mg/150 mL  Status:  Discontinued        750 mg 150 mL/hr over 60 Minutes Intravenous Every 12 hours 06/29/21 1253 06/29/21 1254   06/29/21 1700  vancomycin (VANCOREADY) IVPB 750 mg/150 mL  Status:  Discontinued        750 mg 150 mL/hr over 60 Minutes Intravenous Every 12 hours 06/29/21 1308 07/01/21 1301   06/29/21 1615  rifampin (RIFADIN) capsule 300 mg  Status:  Discontinued        300 mg Oral Daily 06/29/21 1606 06/29/21 1607   06/29/21 1130  vancomycin (VANCOREADY) IVPB 1250 mg/250 mL  Status:  Discontinued        1,250 mg 166.7 mL/hr over 90 Minutes Intravenous  Once 06/29/21 1124 06/29/21 1254   06/29/21 1130  ceFEPIme (MAXIPIME) 2 g in sodium chloride 0.9 % 100 mL IVPB  Status:  Discontinued        2 g 200 mL/hr over 30 Minutes Intravenous  Once 06/29/21 1124 06/29/21 1251        I have personally reviewed the following labs and images: CBC: No results for  input(s): WBC, NEUTROABS, HGB, HCT, MCV, PLT in the last 168 hours. BMP &GFR Recent Labs  Lab 07/15/21 0326 07/16/21 0347 07/17/21 0423 07/18/21 0318  NA 135 137 137 138  K 4.1 4.3 4.3 4.4  CL 102 102 102 100  CO2 26 28 27 29   GLUCOSE 111* 92 105* 102*  BUN 15 16 17 19   CREATININE 0.67 0.64 0.77 0.74  CALCIUM 8.5* 8.5* 8.5* 8.7*   Estimated Creatinine Clearance: 59.9 mL/min (by C-G formula based on SCr of 0.74 mg/dL). Liver & Pancreas: No results for input(s): AST, ALT, ALKPHOS, BILITOT, PROT, ALBUMIN in the last 168 hours. No results for input(s): LIPASE, AMYLASE in the last 168 hours. No results for input(s): AMMONIA in the last 168 hours. Diabetic: No results for input(s): HGBA1C in the last 72 hours. No results for input(s): GLUCAP in the last 168 hours. Cardiac Enzymes: No results for input(s): CKTOTAL, CKMB, CKMBINDEX, TROPONINI in the last 168 hours. No results for input(s): PROBNP in the last 8760 hours. Coagulation Profile: No results for input(s): INR, PROTIME in the last 168 hours. Thyroid Function Tests: No results for input(s): TSH, T4TOTAL, FREET4, T3FREE, THYROIDAB in the last 72 hours. Lipid Profile: No results for input(s): CHOL, HDL, LDLCALC, TRIG, CHOLHDL, LDLDIRECT in the last 72 hours. Anemia Panel: No results for input(s): VITAMINB12, FOLATE, FERRITIN, TIBC, IRON, RETICCTPCT in the last 72 hours. Urine analysis:    Component Value Date/Time   COLORURINE YELLOW (A) 07/04/2015 1709   APPEARANCEUR CLOUDY (A) 07/04/2015 1709   LABSPEC 1.015 07/04/2015 1709   PHURINE 5.0 07/04/2015 1709   GLUCOSEU NEGATIVE 07/04/2015 1709   HGBUR NEGATIVE 07/04/2015 1709   BILIRUBINUR NEGATIVE 07/04/2015 1709   KETONESUR NEGATIVE 07/04/2015 1709   PROTEINUR NEGATIVE 07/04/2015 1709   UROBILINOGEN 0.2 06/28/2015 1150   NITRITE NEGATIVE 07/04/2015 1709   LEUKOCYTESUR NEGATIVE 07/04/2015 1709   Sepsis Labs: Invalid input(s): PROCALCITONIN,  [place]  Microbiology: No results found for this or any previous visit (from the past 240 hour(s)).  Radiology Studies: No results found.    Shray Hunley T. [place]  If 7PM-7AM, please contact night-coverage [email or web address] 07/21/2021, 1:04 PM

## 2021-07-22 DIAGNOSIS — L02419 Cutaneous abscess of limb, unspecified: Secondary | ICD-10-CM | POA: Diagnosis not present

## 2021-07-22 DIAGNOSIS — R7881 Bacteremia: Secondary | ICD-10-CM | POA: Diagnosis not present

## 2021-07-22 DIAGNOSIS — E43 Unspecified severe protein-calorie malnutrition: Secondary | ICD-10-CM | POA: Diagnosis not present

## 2021-07-22 DIAGNOSIS — B182 Chronic viral hepatitis C: Secondary | ICD-10-CM | POA: Diagnosis not present

## 2021-07-22 NOTE — Progress Notes (Signed)
  Mobility Specialist Criteria Algorithm Info.  Mobility Team: HOB elevated: Activity: Ambulated in hall; Dangled on edge of bed Range of motion: Active; All extremities Level of assistance: Independent Assistive device: None Minutes ambulated: 15 minutes Distance ambulated (ft): 2200 ft Mobility response: Tolerated well Bed Position: Semi-fowlers  Patient ambulated in hallway independently with steady gait. Tolerated ambulation well without complaint or incident. Was left lying supine in bed with all needs met.    07/22/2021 3:49 PM

## 2021-07-22 NOTE — Plan of Care (Signed)
  Problem: Education: Goal: Knowledge of General Education information will improve Description: Including pain rating scale, medication(s)/side effects and non-pharmacologic comfort measures Outcome: Progressing   Problem: Health Behavior/Discharge Planning: Goal: Ability to manage health-related needs will improve Outcome: Progressing   Problem: Clinical Measurements: Goal: Will remain free from infection Outcome: Progressing   

## 2021-07-22 NOTE — Progress Notes (Signed)
PROGRESS NOTE  Yvette Gaines QVZ:[id number] DOB: 1971-04-08   PCP: [name], DO  Patient is from: Home.  DOA: 06/29/2021 LOS: 34  Chief complaints:  Chief Complaint  Patient presents with   Chest Pain   Weakness   Shortness of Breath   Drug Problem     Brief Narrative / Interim history: 50 year old F with PMH of intermittent asthma, IVDU with opiates and recent diagnosis of MRSA bacteremia with tricuspid valve endocarditis and bilateral pulmonary septic emboli presenting with left shoulder swelling and pain.  Patient presented to [facility] on 10/5 with left shoulder and left calf swelling and pain, and underwent I&D on 10/6 but left AMA.  Deep tissue culture with MRSA.  She returned to [facility] on 10/16 with new onset chest pain and shortness of breath and found to have bilateral septic emboli.  Blood culture with MRSA.  Echo revealed kidney vegetation.  Patient left AMA again and presented to [facility] to seek treatment on 10/2.  Initially started on IV vancomycin but changed to daptomycin and ceftaroline due to persistent bacteremia.  Eventually cleared and switched back to IV vancomycin on 10/29.  She will remain in-house on IV vancomycin through 08/18/2021.   Subjective: Seen and examined earlier this morning.  No major events overnight of this morning.  No complaints other than being cold for days now.  Walking in the room.  Objective: Vitals:   07/21/21 2040 07/22/21 0458 07/22/21 0500 07/22/21 0845  BP: 129/90 107/76  104/64  Pulse: 78 76  79  Resp: 15 15  16   Temp: 98.6 F (37 C) 98.4 F (36.9 C)  98.2 F (36.8 C)  TempSrc: Oral Oral  Oral  SpO2: 100% 100%  100%  Weight:   46.2 kg   Height:        Intake/Output Summary (Last 24 hours) at 07/22/2021 1258 Last data filed at 07/22/2021 0846 Gross per 24 hour  Intake 610 ml  Output --  Net 610 ml   Filed Weights   07/19/21 0436 07/21/21 0444 07/22/21 0500  Weight: 48.3 kg 45.1 kg 46.2 kg     Examination:  GENERAL: No apparent distress.  Nontoxic. HEENT: MMM.  Vision and hearing grossly intact.  NECK: Supple.  No apparent JVD.  RESP: On 2% on RA.  No IWOB.  Fair aeration bilaterally. CVS:  RRR. Heart sounds normal.  ABD/GI/GU: BS+. Abd soft, NTND.  MSK/EXT:  Moves extremities. No apparent deformity.  Dry scab on left calf. SKIN: no apparent skin lesion or wound NEURO: Awake and alert. Oriented appropriately.  No apparent focal neuro deficit. PSYCH: Calm. Normal affect.   Procedures:  10/25-TEE with normal LVEF, 1.35 x 0.582 cm mobile TV vegetation  Microbiology summarized: 10/21-COVID-19 and influenza PCR nonreactive. 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 Bilateral pulmonary septic emboli Right shoulder and right calf abscess-s/p ID in [facility].  Reportedly fluid culture with MRSA.  Resolving -Vancomycin>> daptomycin and ceftaroline 10/28>> vancomycin 11/4>>> 08/18/2021 -PICC line placed on 07/12/2021 -Patient to continue IV antibiotics in-house due to IVDU   Pleuritic chest pain-likely due to the above.  Resolved.  Headache and sinus congestion: Resolved. -Continue Zyrtec and saline nose spray  IVDU with narcotics-continue Suboxone while in-house.   -Apparently, Suboxone was a barrier for discharge to SNF.  Plan is to discharge on Oxy IR 10 mg every 6 hours when ready to leave to  SNF, then follow-up at her prvious Suboxone clinic when discharged from SNF.  Depression and insomnia: Stable. -Continue home Celexa, BuSpar and trazodone at bedtime    Thrombocytopenia: Resolved.   Mild intermittent asthma/current tobacco abuse: Stable. -Encouraged tobacco cessation -Continue nicotine patch   Normocytic anemia: Anemia panel suggest this anemia of chronic disease.  H&H stable. Recent Labs    06/29/21 0924 06/30/21 0127 07/01/21 0155  07/02/21 0101 07/04/21 0135 07/11/21 0150 07/14/21 0426  HGB 8.5* 7.6* 7.1* 8.1* 7.8* 8.9* 8.4*  -Monitor H&H -Transfuse for Hgb less than 7   Positive hepatitis C antibody -ID recommended treatment after discharge.   Severe protein calorie malnutrition Body mass index is 18.63 kg/m. Nutrition Problem: Severe Malnutrition Etiology: chronic illness, cancer and cancer related treatments Signs/Symptoms: severe fat depletion, severe muscle depletion Interventions: Ensure Enlive (each supplement provides 350kcal and 20 grams of protein), MVI   DVT prophylaxis:  enoxaparin (LOVENOX) injection 40 mg Start: 06/30/21 2005 Place and maintain sequential compression device Start: 06/29/21 1318  Code Status: Full code Family Communication: Patient and/or RN. Available if any question.  Level of care: Med-Surg Status is: Inpatient  Remains inpatient appropriate because: Needs IV antibiotics for MRSA bacteremia.  Not eligible for home IV antibiotics due to IVDU   Consultants:  Infectious disease Cardiology   Sch Meds:  Scheduled Meds:  buprenorphine-naloxone  1.5 tablet Sublingual Daily   busPIRone  5 mg Oral TID   cetirizine HCl  5 mg Oral Daily   Chlorhexidine Gluconate Cloth  6 each Topical Daily   citalopram  20 mg Oral Daily   enoxaparin (LOVENOX) injection  40 mg Subcutaneous Q24H   famotidine  20 mg Oral Daily   feeding supplement  237 mL Oral TID BM   multivitamin with minerals  1 tablet Oral Daily   nicotine  21 mg Transdermal Daily   sodium chloride flush  10-40 mL Intracatheter Q12H   traZODone  150 mg Oral QHS   Continuous Infusions:  vancomycin 1,000 mg (07/21/21 2046)   PRN Meds:.acetaminophen **OR** acetaminophen, albuterol, ALPRAZolam, hydrocortisone cream, ibuprofen, influenza vac split quadrivalent PF, ondansetron **OR** ondansetron (ZOFRAN) IV, sodium chloride flush  Antimicrobials: Anti-infectives (From admission, onward)    Start     Dose/Rate Route  Frequency Ordered Stop   07/14/21 2000  vancomycin (VANCOCIN) IVPB 1000 mg/200 mL premix        1,000 mg 200 mL/hr over 60 Minutes Intravenous Every 24 hours 07/12/21 1400     07/13/21 2000  vancomycin (VANCOREADY) IVPB 1250 mg/250 mL        1,250 mg 166.7 mL/hr over 90 Minutes Intravenous  Once 07/12/21 1400 07/13/21 2326   07/06/21 1400  ceftaroline (TEFLARO) 600 mg in sodium chloride 0.9 % 100 mL IVPB        600 mg 100 mL/hr over 60 Minutes Intravenous Every 8 hours 07/06/21 1033 07/12/21 2229   07/06/21 1330  DAPTOmycin (CUBICIN) 500 mg in sodium chloride 0.9 % IVPB        500 mg 120 mL/hr over 30 Minutes Intravenous Daily 07/06/21 1233 07/12/21 2022   07/01/21 2200  vancomycin (VANCOREADY) IVPB 750 mg/150 mL  Status:  Discontinued        750 mg 150 mL/hr over 60 Minutes Intravenous Every 12 hours 07/01/21 1314 07/06/21 1033   07/01/21 1400  valACYclovir (VALTREX) tablet 1,000 mg        1,000 mg Oral 2 times daily 07/01/21 1306 07/08/21 0959   06/30/21 0200  vancomycin (VANCOREADY) IVPB 750 mg/150 mL  Status:  Discontinued        750 mg 150 mL/hr over 60 Minutes Intravenous Every 12 hours 06/29/21 1253 06/29/21 1254   06/29/21 1700  vancomycin (VANCOREADY) IVPB 750 mg/150 mL  Status:  Discontinued        750 mg 150 mL/hr over 60 Minutes Intravenous Every 12 hours 06/29/21 1308 07/01/21 1301   06/29/21 1615  rifampin (RIFADIN) capsule 300 mg  Status:  Discontinued        300 mg Oral Daily 06/29/21 1606 06/29/21 1607   06/29/21 1130  vancomycin (VANCOREADY) IVPB 1250 mg/250 mL  Status:  Discontinued        1,250 mg 166.7 mL/hr over 90 Minutes Intravenous  Once 06/29/21 1124 06/29/21 1254   06/29/21 1130  ceFEPIme (MAXIPIME) 2 g in sodium chloride 0.9 % 100 mL IVPB  Status:  Discontinued        2 g 200 mL/hr over 30 Minutes Intravenous  Once 06/29/21 1124 06/29/21 1251        I have personally reviewed the following labs and images: CBC: No results for input(s): WBC,  NEUTROABS, HGB, HCT, MCV, PLT in the last 168 hours. BMP &GFR Recent Labs  Lab 07/16/21 0347 07/17/21 0423 07/18/21 0318  NA 137 137 138  K 4.3 4.3 4.4  CL 102 102 100  CO2 28 27 29   GLUCOSE 92 105* 102*  BUN 16 17 19   CREATININE 0.64 0.77 0.74  CALCIUM 8.5* 8.5* 8.7*   Estimated Creatinine Clearance: 61.4 mL/min (by C-G formula based on SCr of 0.74 mg/dL). Liver & Pancreas: No results for input(s): AST, ALT, ALKPHOS, BILITOT, PROT, ALBUMIN in the last 168 hours. No results for input(s): LIPASE, AMYLASE in the last 168 hours. No results for input(s): AMMONIA in the last 168 hours. Diabetic: No results for input(s): HGBA1C in the last 72 hours. No results for input(s): GLUCAP in the last 168 hours. Cardiac Enzymes: No results for input(s): CKTOTAL, CKMB, CKMBINDEX, TROPONINI in the last 168 hours. No results for input(s): PROBNP in the last 8760 hours. Coagulation Profile: No results for input(s): INR, PROTIME in the last 168 hours. Thyroid Function Tests: No results for input(s): TSH, T4TOTAL, FREET4, T3FREE, THYROIDAB in the last 72 hours. Lipid Profile: No results for input(s): CHOL, HDL, LDLCALC, TRIG, CHOLHDL, LDLDIRECT in the last 72 hours. Anemia Panel: No results for input(s): VITAMINB12, FOLATE, FERRITIN, TIBC, IRON, RETICCTPCT in the last 72 hours. Urine analysis:    Component Value Date/Time   COLORURINE YELLOW (A) 07/04/2015 1709   APPEARANCEUR CLOUDY (A) 07/04/2015 1709   LABSPEC 1.015 07/04/2015 1709   PHURINE 5.0 07/04/2015 1709   GLUCOSEU NEGATIVE 07/04/2015 1709   HGBUR NEGATIVE 07/04/2015 1709   BILIRUBINUR NEGATIVE 07/04/2015 1709   KETONESUR NEGATIVE 07/04/2015 1709   PROTEINUR NEGATIVE 07/04/2015 1709   UROBILINOGEN 0.2 06/28/2015 1150   NITRITE NEGATIVE 07/04/2015 1709   LEUKOCYTESUR NEGATIVE 07/04/2015 1709   Sepsis Labs: Invalid input(s): PROCALCITONIN, [place]  Microbiology: No results found for this or any previous visit (from the  past 240 hour(s)).  Radiology Studies: No results found.    Angad Nabers T. [place]  If 7PM-7AM, please contact night-coverage [email or web address] 07/22/2021, 12:58 PM

## 2021-07-23 DIAGNOSIS — R7881 Bacteremia: Secondary | ICD-10-CM | POA: Diagnosis not present

## 2021-07-23 DIAGNOSIS — B9562 Methicillin resistant Staphylococcus aureus infection as the cause of diseases classified elsewhere: Secondary | ICD-10-CM | POA: Diagnosis not present

## 2021-07-23 LAB — BASIC METABOLIC PANEL
Anion gap: 8 (ref 5–15)
BUN: 21 mg/dL — ABNORMAL HIGH (ref 6–20)
CO2: 27 mmol/L (ref 22–32)
Calcium: 9 mg/dL (ref 8.9–10.3)
Chloride: 101 mmol/L (ref 98–111)
Creatinine, Ser: 0.66 mg/dL (ref 0.44–1.00)
GFR, Estimated: >60 mL/min (ref >60)
Glucose, Bld: 92 mg/dL (ref 70–99)
Potassium: 4.2 mmol/L (ref 3.5–5.1)
Sodium: 136 mmol/L (ref 135–145)

## 2021-07-23 NOTE — Progress Notes (Signed)
[facility] PROGRESS NOTE  Yvette Gaines FGH:[id number] DOB: 1971-05-06 DOA: 06/29/2021 PCP: [name], DO  Status: Remains inpatient appropriate because:  Unsafe to discharge patient with history of IVDU with PICC line in place for outpatient IV therapy Patient requires a total of 6 weeks of IV antibiotics with blood cultures have as of 11/3 and last dose due 12/10  Barriers to discharge: Social: IVDU history so unable to discharge with a PICC line in place to complete antibiotic therapies in the outpatient environment/home setting  Clinical: Final blood cultures from 10/29 returned negative as of 11/3 Has done well on Suboxone therefore we will continue this medication until a bed offer obtained and an actual discharge date confirmed before transitioning over to short acting scheduled Oxy IR  Level of care:  Med-Surg   Code Status: Full Family Communication: Patient DVT prophylaxis: Lovenox COVID vaccination status: Unknown  HPI: 50 y.o. 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   Patient presented to [facility] on 10/05 for worsening of left shoulder swelling and pain.  Was found to have abscess of left shoulder and left calf,  I&D was done on 10/06 and culture showed MRSA.[name]  Patient however signed out [place] after the surgery. On 10/16, patient came back to [facility] complaining about new onset of chest pains and shortness of breath, CT chest showed multiple bilateral septic emboli, echocardiogram showed new onset of tricuspid vegetation compatible with endocarditis.  Blood culture showed again MRSA.  Patient was started on vancomycin and cefepime since. During hospital stay, patient was also found to develop acute thrombocytopenia, DIC was ruled out.   Patient was admitted to [facility], and started on IV vancomycin, TEE with tricuspid leaflet vegetation.   Subjective: Alert.  Planning on walking after breakfast.  Complaining of posterior neck pain that she attributes to hospital bed.  Heat packs help but do not last long enough in duration.  Objective: Vitals:   07/23/21 0458 07/23/21 0737  BP: 113/62 114/76  Pulse: 82 80  Resp: 16 16  Temp: 98.4 F (36.9 C) 98.5 F (36.9 C)  SpO2: 99% 100%    Intake/Output Summary (Last 24 hours) at 07/23/2021 0758 Last data filed at 07/22/2021 2129 Gross per 24 hour  Intake 1220 ml  Output --  Net 1220 ml    Filed Weights   07/21/21 0444 07/22/21 0500 07/23/21 0500  Weight: 45.1 kg 46.2 kg 47.4 kg    Exam:  Constitutional: NAD, alert-in room Respiratory: Lung sounds CTA, room air, normal respiratory effort Cardiovascular: S1-S2 without murmurs or rubs, normotensive, no peripheral edema Abdomen: no tenderness. Bowel sounds positive.  LBM 11/12 Neurologic: CN 2-12 grossly intact. Sensation intact, Strength 5/5 x all 4 extremities.  Psychiatric: Normal judgment and insight. Alert and oriented x 3.     Assessment/Plan: Acute problems:   MRSA bacteremia Active Problems:   Severe opioid use disorder (HCC)   Endocarditis   Septic pulmonary embolism (HCC)   Chronic viral hepatitis C (HCC)   Shoulder abscess   Calf abscess   Odynophagia   Protein-calorie malnutrition, severe     MRSA bacteremia/MRSA TV endocarditis/septic emboli/recent right shoulder and right calf abscess /status post I&D 10/6 (at [facility])  -TEE: tricuspid valve endocarditis.  CT surgery: not a candidate for angio VAC due to small size  of vegetation -Vancomycin discontinued, narrowed to Teflaro and daptomycin by ID. -Last blood cultures obtained on 10/29- ID rec OK to place PICC on 11/3 -Narrowed to vancomycin 11/5-LD due 12/10  Pleuritic chest pain -11/5 repeat echocardiogram: Small residual  vegetation noted on the anterior TV leaflet. Tricuspid valve regurgitation moderate. -CTA chest : Continued evolution of septic pulmonary emboli now globally increased surrounding consolidated opacifications  Headache and sinus congestion -Continue Zyrtec and ibuprofen  Neck discomfort -K pad ordered  IVDU with narcotics -Continued on Suboxone this admission -I have been informed by TOC that Suboxone is a barrier to dc to SNF.  Plan is to continue Suboxone for now and once bed offer secured and actual discharge date confirmed plan to transition to Oxy IR 10 mg every 6 hours as recommended by Pharmacist and discontinue Suboxone . -Recommend resume Suboxone once discharged from SNF. Previously received Suboxone at a clinic in [facility]  Depression and insomnia Continue preadmission Celexa, BuSpar and trazodone at bedtime  Patient reports improvement in insomnia  Thrombocytopenia:  Resolved.   Recent right shoulder and calf abscess -Source of bacteremia -Status post I&D on 10/06 at [place] at outside hospital revealed no infectious involvement of AC joint    Severe protein calorie malnutrition Nutrition Problem: Severe Malnutrition Etiology: chronic illness and poor intake prior to admission secondary to IVDU Body mass index is 19.11 kg/m.  Signs/Symptoms: severe fat depletion, severe muscle depletion Interventions: Ensure Enlive (each supplement provides 350kcal and 20 grams of protein), Magic cup, MVI    Mild intermittent asthma/current tobacco abuse -Currently asymptomatic -Continue prn bronchodilators and nicotine patch   Normocytic anemia with iron deficiency -Secondary to chronic malnutrition -Current hemoglobin 7.8 -Transfuse for hemoglobin less than 7.      +HCV AB -ID recommended treatment after discharge. -Also ID recommended HIV preexposure prophylaxis with either oral Truvada or long-acting aptitude injectable   Blood culture data: -Blood  cultures positive for MRSA on 10/21, 10/22.   -Blood cultures 1/2 positive from 10/24. -Blood cultures 1/2 positive from 10/25. -Blood cultures 2 out of 2 positive from 10/27. -Blood cultures from 10/29 and finalized on 11/3   Data Reviewed: Basic Metabolic Panel: Recent Labs  Lab 07/17/21 0423 07/18/21 0318 07/23/21 0350  NA 137 138 136  K 4.3 4.4 4.2  CL 102 100 101  CO2 27 29 27   GLUCOSE 105* 102* 92  BUN 17 19 21*  CREATININE 0.77 0.74 0.66  CALCIUM 8.5* 8.7* 9.0   CBC: No results for input(s): WBC, NEUTROABS, HGB, HCT, MCV, PLT in the last 168 hours.       Scheduled Meds:  buprenorphine-naloxone  1.5 tablet Sublingual Daily   busPIRone  5 mg Oral TID   cetirizine HCl  5 mg Oral Daily   Chlorhexidine Gluconate Cloth  6 each Topical Daily   citalopram  20 mg Oral Daily   enoxaparin (LOVENOX) injection  40 mg Subcutaneous Q24H   famotidine  20 mg Oral Daily   feeding supplement  237 mL Oral TID BM   multivitamin with minerals  1 tablet Oral Daily   nicotine  21 mg Transdermal Daily   sodium chloride flush  10-40 mL Intracatheter Q12H   traZODone  150 mg Oral QHS   Continuous Infusions:  vancomycin 1,000 mg (07/22/21 2129)    Principal Problem:   MRSA bacteremia Active Problems:   Severe opioid use disorder (HCC)   Endocarditis   Septic pulmonary embolism (HCC)   Chronic viral hepatitis  C ([place])   Shoulder abscess   Calf abscess   Odynophagia   Protein-calorie malnutrition, severe   Chest pain   Consultants: Infectious disease Cardiothoracic surgery  Procedures: TTE TEE  Antibiotics: Vancomycin 10/21 through 10/28 Valacyclovir 10/23 through 10/29 Daptomycin 10/28 >> 11/05 Ceftaroline 10/28 >> 11/05 Vancomycin 11/05 >>   Time spent: 15 minutes    Erin Hearing ANP  [facility] 7 am - 330 pm/M-F for direct patient care and secure chat Please refer to Amion for contact info 24  days

## 2021-07-24 DIAGNOSIS — R7881 Bacteremia: Secondary | ICD-10-CM | POA: Diagnosis not present

## 2021-07-24 DIAGNOSIS — B9562 Methicillin resistant Staphylococcus aureus infection as the cause of diseases classified elsewhere: Secondary | ICD-10-CM | POA: Diagnosis not present

## 2021-07-24 LAB — VANCOMYCIN, PEAK
Vancomycin Pk: 8 ug/mL — ABNORMAL LOW (ref 30–40)
Vancomycin Pk: 38 ug/mL (ref 30–40)

## 2021-07-24 NOTE — Progress Notes (Signed)
[facility] PROGRESS NOTE  Yvette Gaines [id number]:[id number] DOB: [birth year] DOA: 06/29/2021 PCP: [name], DO  Status: Remains inpatient appropriate because:  Unsafe to discharge patient with history of IVDU with PICC line in place for outpatient IV therapy Patient requires a total of 6 weeks of IV antibiotics with blood cultures have as of 11/3 and last dose due 12/10  Barriers to discharge: Social: IVDU history so unable to discharge with a PICC line in place to complete antibiotic therapies in the outpatient environment/home setting  Clinical: Final blood cultures from 10/29 returned negative as of 11/3 Has done well on Suboxone therefore we will continue this medication until a bed offer obtained and an actual discharge date confirmed before transitioning over to short acting scheduled Oxy IR  Level of care:  Med-Surg   Code Status: Full Family Communication: Patient DVT prophylaxis: Lovenox COVID vaccination status: Unknown  HPI: 50 y.o. 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   Patient presented to [facility] on 10/05 for worsening of left shoulder swelling and pain.  Was found to have abscess of left shoulder and left calf,  I&D was done on 10/06 and culture showed MRSA.[name]  Patient however signed out [place] after the surgery. On 10/16, patient came back to [facility] complaining about new onset of chest pains and shortness of breath, CT chest showed multiple bilateral septic emboli, echocardiogram showed new onset of tricuspid vegetation compatible with endocarditis.  Blood culture showed again MRSA.  Patient was started on vancomycin and cefepime since. During hospital stay, patient was also found to develop acute thrombocytopenia, DIC was ruled out.   Patient was admitted to [facility], and started on IV vancomycin, TEE with tricuspid leaflet vegetation.   Subjective: Doing well.  States neck pain is much better after utilization of K pad.  Is showing me her new study Bible that was sent to her in the mail by her ex-husband.  Objective: Vitals:   07/23/21 2020 07/24/21 0525  BP: 112/75 110/68  Pulse: 85 78  Resp: 17 18  Temp: 98.6 F (37 C) 97.6 F (36.4 C)  SpO2: 99% 100%   No intake or output data in the 24 hours ending 07/24/21 0810   Filed Weights   07/21/21 0444 07/22/21 0500 07/23/21 0500  Weight: 45.1 kg 46.2 kg 47.4 kg    Exam:  Constitutional: NAD, alert, eating on side of bed Respiratory: Lung sounds CTA, room air, normal respiratory effort Cardiovascular: S1-S2 without murmurs or rubs, normotensive, no peripheral edema Abdomen: no tenderness. Bowel sounds positive.  LBM 11/12 Neurologic: CN 2-12 grossly intact. Sensation intact, Strength 5/5 x all 4 extremities.  Psychiatric: Normal judgment and insight. Alert and oriented x 3.     Assessment/Plan: Acute problems:   MRSA bacteremia Active Problems:   Severe opioid use disorder (HCC)   Endocarditis   Septic pulmonary embolism (HCC)   Chronic viral hepatitis C (HCC)   Shoulder abscess   Calf abscess   Odynophagia   Protein-calorie malnutrition, severe     MRSA bacteremia/MRSA TV endocarditis/septic emboli/recent right shoulder and right calf abscess /status post I&D 10/6 (at [facility])  -TEE: tricuspid valve endocarditis.  CT surgery: not a candidate for angio VAC due to small size of vegetation -Vancomycin discontinued, narrowed to Teflaro and daptomycin by ID. -Last blood  cultures obtained on 10/29- PICC placed on 11/3 -Narrowed to vancomycin 11/5-LD due 12/10  Pleuritic chest pain -11/5 repeat echocardiogram: Small residual vegetation noted on the anterior TV leaflet. Tricuspid valve regurgitation moderate. -CTA chest : Continued  evolution of septic pulmonary emboli now globally increased surrounding consolidated opacifications which likely explains recent pleuritic chest discomfort  Headache and sinus congestion -Continue Zyrtec and ibuprofen  Neck discomfort -Symptoms resolved with utilization of K pad   IVDU with narcotics -Continued on Suboxone this admission   -Plan is to continue Suboxone for now and once bed offer secured and actual discharge date confirmed plan to transition to Oxy IR 10 mg every 6 hours as recommended by Pharmacist and discontinue Suboxone . -Recommend resume Suboxone once discharged from SNF. Previously received Suboxone at a clinic in [facility]  Depression and insomnia Continue preadmission Celexa, BuSpar and trazodone at bedtime  Patient reports improvement in insomnia  Thrombocytopenia:  Resolved.   Recent right shoulder and calf abscess -Source of bacteremia -Status post I&D on 10/06 at [place] at outside hospital revealed no infectious involvement of AC joint    Severe protein calorie malnutrition Nutrition Problem: Severe Malnutrition Etiology: chronic illness and poor intake prior to admission secondary to IVDU Body mass index is 19.11 kg/m.  Signs/Symptoms: severe fat depletion, severe muscle depletion Interventions: Ensure Enlive (each supplement provides 350kcal and 20 grams of protein), Magic cup, MVI    Mild intermittent asthma/current tobacco abuse -Currently asymptomatic -Continue prn bronchodilators and nicotine patch   Normocytic anemia with iron deficiency -Secondary to chronic malnutrition -Current hemoglobin 7.8 -Transfuse for hemoglobin less than 7.      +HCV AB -ID recommended treatment after discharge. -Also ID recommended HIV preexposure prophylaxis with either oral Truvada or long-acting aptitude injectable   Blood culture data: -Blood cultures positive for MRSA on 10/21, 10/22.   -Blood cultures 1/2 positive from  10/24. -Blood cultures 1/2 positive from 10/25. -Blood cultures 2 out of 2 positive from 10/27. -Blood cultures from 10/29 and finalized on 11/3   Data Reviewed: Basic Metabolic Panel: Recent Labs  Lab 07/18/21 0318 07/23/21 0350  NA 138 136  K 4.4 4.2  CL 100 101  CO2 29 27  GLUCOSE 102* 92  BUN 19 21*  CREATININE 0.74 0.66  CALCIUM 8.7* 9.0     Scheduled Meds:  buprenorphine-naloxone  1.5 tablet Sublingual Daily   busPIRone  5 mg Oral TID   cetirizine HCl  5 mg Oral Daily   Chlorhexidine Gluconate Cloth  6 each Topical Daily   citalopram  20 mg Oral Daily   enoxaparin (LOVENOX) injection  40 mg Subcutaneous Q24H   famotidine  20 mg Oral Daily   feeding supplement  237 mL Oral TID BM   multivitamin with minerals  1 tablet Oral Daily   nicotine  21 mg Transdermal Daily   sodium chloride flush  10-40 mL Intracatheter Q12H   traZODone  150 mg Oral QHS   Continuous Infusions:  vancomycin 1,000 mg (07/23/21 2007)    Principal Problem:   MRSA bacteremia Active Problems:   Severe opioid use disorder ([place])   Endocarditis   Septic pulmonary embolism (HCC)   Chronic viral hepatitis C ([place])   Shoulder abscess   Calf abscess   Odynophagia   Protein-calorie malnutrition, severe   Chest pain   Consultants: Infectious disease Cardiothoracic surgery  Procedures: TTE TEE  Antibiotics: Vancomycin 10/21 through 10/28 Valacyclovir 10/23 through 10/29 Daptomycin 10/28 >> 11/05 Ceftaroline 10/28 >>  11/05 Vancomycin 11/05 >>   Time spent: 15 minutes    Erin Hearing ANP  [facility] 7 am - 330 pm/M-F for direct patient care and secure chat Please refer to Amion for contact info 25  days

## 2021-07-24 NOTE — Progress Notes (Signed)
Pharmacy Antibiotic Note  Yvette Gaines is a 50 y.o. 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 Patient was previously not clearing her blood cultures and was put on combination therapy with daptomycin and ceftaroline for 7 days ending 11/3. Blood cultures from 10/29 with no growth (final). ECHO on 11/6 showed small residual vegetations on the anterior TV leaflet. Pharmacy was consulted for vancomycin dosing.  Vanc levels ordered 11/15-16. Renal function stable     Plan: Continue vancomycin IV 1000 mg Q24H  Monitor  renal function, weekly vancomycin levels Plan to treat for 6 weeks weeks 10/29 - 12/10   Height: 5\' 2"  (157.5 cm) Weight: 47.4 kg (104 lb 8 oz) IBW/kg (Calculated) : 50.1 kg  Temp (24hrs), Avg:98.1 F (36.7 C), Min:97.6 F (36.4 C), Max:98.6 F (37 C)  Recent Labs  Lab 07/17/21 2225 07/18/21 0318 07/18/21 1943 07/23/21 0350  CREATININE  --  0.74  --  0.66  VANCOTROUGH  --   --  7*  --   VANCOPEAK 33  --   --   --      Estimated Creatinine Clearance: 63 mL/min (by C-G formula based on SCr of 0.66 mg/dL).    No Known Allergies  Anti-inflectives this admission: Valacyclovir 10/23 >> 10/29 Vancomycin 10/21 >> 10/28, restarted 11/4 >>(12/10) Daptomycin 10/28 >> 11/3 Ceftaroline 10/28 >> 11/3  Vancomycin levels  11/8 VP 33; 11/9 VT 7, AUC 443 > continue  11/15 VP: pending; 11/16 VT: pending    Microbiology results: 10/21 BCx: MRSA 3/3 10/22 BCx >> MRSA 2/2 10/24 BCx >> 1/3 MRSA  10/25 Bcx >> 1/4 MRSA 10/27 Bcx >> MRSA 4/4 10/29 BCx >> NG/final 10/21 Hepatitis A, Hepatitis B, HIV serologies: negative 10/21 Hepatitis C antibody: reactive 10/27 HCV quant: >50 IU/ml  Thank you for allowing pharmacy to be a part of this patient's care.  Benetta Spar, PharmD, BCPS, BCCP Clinical Pharmacist  Please check AMION for all [place] phone numbers After 10:00 PM, call [place] [phone]

## 2021-07-24 NOTE — TOC Progression Note (Signed)
Transition of Care [facility]) - Progression Note    Patient Details  Name: Anajulia Leyendecker MRN: [id number] Date of Birth: July 30, 1971  Transition of Care [facility]) CM/SW [place], RN Phone Number: 07/24/2021, 10:59 AM  Clinical Narrative:    Lenna Sciara, [place] with [place] [phone] called and offered support and assistance for finding possible SNF placement for the patient.  [facility] case manager was made aware that the patient has no active bed offers for placement due to her IV drug abuse history, need for IV antibiotics through PICC line and medical treatment with Suboxone at this time.  I informed the CM at [place], [place] has a SAPP program available but the provider is not in network with the patient's health insurance is an now a viable option for discharge at this time through her [place].  The patient is an unsafe discharge to home due to her need for IV antibiotics and history of IV drug abuse.  CM and MSW with DTP Team will continue to follow the patient for discharge planning.   Expected Discharge Plan: Skilled Nursing Facility Barriers to Discharge: Ship broker, Continued Medical Work up, SNF Pending bed offer  Expected Discharge Plan and Services Expected Discharge Plan: [place] In-house Referral: PCP / Psychologist, educational, Conservation officer, nature Services: CM Consult   Living arrangements for the past 2 months: Apartment                                       Social Determinants of Health (SDOH) Interventions    Readmission Risk Interventions No flowsheet data found.

## 2021-07-24 NOTE — Progress Notes (Signed)
   07/24/21 1500  Mobility  Activity Ambulated in hall;Dangled on edge of bed  Range of Motion/Exercises Active;All extremities  Level of Assistance Independent  Assistive Device None  Distance Ambulated (ft) 2000 ft  Mobility Ambulated with assistance in hallway  Mobility Response Tolerated well  Mobility performed by Mobility specialist  Bed Position Semi-fowlers  $Mobility charge 1 Mobility

## 2021-07-24 NOTE — Progress Notes (Signed)
Nutrition Follow-up  DOCUMENTATION CODES:   Severe malnutrition in context of chronic illness  INTERVENTION:   Continue Multivitamin w/ minerals daily  Continue Ensure Enlive po TID, each supplement provides 350 kcal and 20 grams of protein  Encourage good PO intake   NUTRITION DIAGNOSIS:   Severe Malnutrition related to chronic illness, cancer and cancer related treatments as evidenced by severe fat depletion, severe muscle depletion. 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  Pt reports that she is still doing well, that she is eating majority of her meals. Pt reports that she is not as hungry as night, pt still declining PLDN to order snacks. Pt reports that she is drinking at least 2 Ensures/day. Pt states that is she is hungry  the unit staff is always willing to get her something that is on the unit.  Per EMR, pt intake includes:  11/10 - Lunch 100%, Dinner 75% 11/11 - Breakfast 100%, Lunch 75%, Dinner 75% 11/12 - Lunch 75% 11/13 - Breakfast 100%, Lunch 75%  Pt with no other questions or concerns at this time.   Continued inpatient due to needing IV antibiotics until 12/10.  Medications reviewed and include: Pepcid, MVI, IV antibiotics Labs reviewed.  Diet Order:   Diet Order             Diet regular Room service appropriate? Yes; Fluid consistency: Thin  Diet effective now                   EDUCATION NEEDS:   No education needs have been identified at this time  Skin:  Skin Assessment: Skin Integrity Issues: Skin Integrity Issues::  Other (Comment) Other: Skin tear - L leg  Last BM:  07/21/2021  Height:   Ht Readings from Last 1 Encounters:  07/03/21 5\' 2"  (1.575 m)    Weight:   Wt Readings from Last 1 Encounters:  07/23/21 47.4 kg    Ideal Body Weight:  50 kg  BMI:  Body mass index is 19.11 kg/m.  Estimated Nutritional Needs:   Kcal:  1700-1900  Protein:  85-100 grams  Fluid:  > 1.7 L    Legacie Dillingham BS, PLDN Clinical Dietitian See AMiON for contact information.

## 2021-07-25 DIAGNOSIS — B9562 Methicillin resistant Staphylococcus aureus infection as the cause of diseases classified elsewhere: Secondary | ICD-10-CM | POA: Diagnosis not present

## 2021-07-25 DIAGNOSIS — R7881 Bacteremia: Secondary | ICD-10-CM | POA: Diagnosis not present

## 2021-07-25 NOTE — Progress Notes (Signed)
Mobility Specialist Progress Note   07/25/21 1737  Mobility  Activity Ambulated in hall  Level of Assistance Independent  Assistive Device None  Distance Ambulated (ft) 3000 ft  Mobility Ambulated independently in hallway  Mobility Response Tolerated well  Mobility performed by Mobility specialist  $Mobility charge 1 Mobility   Received pt in door way wanting to walk. Asymptomatic throughout ambulation, returned back to room requesting RN for assistance in showering.  Holland Falling Mobility Specialist Phone Number [phone]

## 2021-07-25 NOTE — Progress Notes (Signed)
[facility] PROGRESS NOTE  Yvette Gaines WIO:[id number] DOB: 03-22-1971 DOA: 06/29/2021 PCP: [name], DO  Status: Remains inpatient appropriate because:  Unsafe to discharge patient with history of IVDU with PICC line in place for outpatient IV therapy Patient requires a total of 6 weeks of IV antibiotics with blood cultures have as of 11/3 and last dose due 12/10  Barriers to discharge: Social: IVDU history so unable to discharge with a PICC line in place to complete antibiotic therapies in the outpatient environment/home setting  Clinical: Final blood cultures from 10/29 returned negative as of 11/3 Has done well on Suboxone therefore we will continue this medication until a bed offer obtained and an actual discharge date confirmed before transitioning over to short acting scheduled Oxy IR  Level of care:  Med-Surg   Code Status: Full Family Communication: Patient DVT prophylaxis: Lovenox COVID vaccination status: Unknown  HPI: 50 y.o. 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   Patient presented to [facility] on 10/05 for worsening of left shoulder swelling and pain.  Was found to have abscess of left shoulder and left calf,  I&D was done on 10/06 and culture showed MRSA.[name]  Patient however signed out [place] after the surgery. On 10/16, patient came back to [facility] complaining about new onset of chest pains and shortness of breath, CT chest showed multiple bilateral septic emboli, echocardiogram showed new onset of tricuspid vegetation compatible with endocarditis.  Blood culture showed again MRSA.  Patient was started on vancomycin and cefepime since. During hospital stay, patient was also found to develop acute thrombocytopenia, DIC was ruled out.   Patient was admitted to [name], and started on IV vancomycin, TEE with tricuspid leaflet vegetation.   Subjective: Alert and ambulating in room without difficulty.  Continues to have mild focal left upper neck pain that has improved with use of ibuprofen and K pad  Objective: Vitals:   07/25/21 0443 07/25/21 0759  BP: 100/68 120/78  Pulse: 80 74  Resp: 16 16  Temp: 98.4 F (36.9 C) 98.4 F (36.9 C)  SpO2: 99% 100%    Intake/Output Summary (Last 24 hours) at 07/25/2021 0802 Last data filed at 07/24/2021 2116 Gross per 24 hour  Intake 960 ml  Output --  Net 960 ml     Filed Weights   07/22/21 0500 07/23/21 0500 07/25/21 0443  Weight: 46.2 kg 47.4 kg 45.6 kg    Exam:  Constitutional: NAD, alert Respiratory: Lung sounds CTA, room air, normal respiratory effort Cardiovascular: S1-S2, normotensive, no peripheral edema Abdomen: no tenderness. Bowel sounds positive.  LBM 11/15 Neurologic: CN 2-12 grossly intact. Sensation intact, Strength 5/5 x all 4 extremities.  Psychiatric: Normal judgment and insight. Alert and oriented x 3.     Assessment/Plan: Acute problems:   MRSA bacteremia Active Problems:   Severe opioid use disorder (HCC)   Endocarditis   Septic pulmonary embolism (HCC)   Chronic viral hepatitis C (HCC)   Shoulder abscess   Calf abscess   Odynophagia   Protein-calorie malnutrition, severe     MRSA bacteremia/MRSA TV endocarditis with moderate TVR/septic emboli/recent right shoulder and right calf abscess /status post I&D 10/6 (at [facility])  -TEE: tricuspid valve endocarditis.  CT surgery: not a candidate for angio VAC due to small size of vegetation - PICC placed  on 11/3 -Narrowed to vancomycin 11/5-LD due 12/10  Pleuritic chest pain -11/5 repeat echocardiogram: Small residual vegetation noted on the anterior TV leaflet. -CTA chest : Continued evolution of septic pulmonary emboli now globally increased surrounding consolidated  opacifications which likely explains recent pleuritic chest discomfort  Headache and sinus congestion -Continue Zyrtec and ibuprofen  Neck discomfort -Continue K pad   IVDU with narcotics -Continued on Suboxone this admission   -Plan is to continue Suboxone for now and once bed offer secured and actual discharge date confirmed plan to transition to Oxy IR 10 mg every 6 hours as recommended by Pharmacist and discontinue Suboxone . -Recommend resume Suboxone once discharged from SNF. Previously received Suboxone at a clinic in [facility]  Depression and insomnia Continue preadmission Celexa, BuSpar and trazodone at bedtime  Patient reports improvement in insomnia  Thrombocytopenia:  Resolved.   Recent right shoulder and calf abscess -Source of bacteremia -Status post I&D on 10/06 at [place] at outside hospital revealed no infectious involvement of AC joint    Severe protein calorie malnutrition Etiology: chronic illness and poor intake prior to admission secondary to IVDU Weight stable at 100.53 lbs  Mild intermittent asthma/current tobacco abuse -Currently asymptomatic -Continue prn bronchodilators and nicotine patch   Normocytic anemia with iron deficiency -Secondary to chronic malnutrition -Current hemoglobin 8.4 -Transfuse for hemoglobin </= 7.      +HCV AB -ID recommended treatment after discharge. -Also ID recommended HIV preexposure prophylaxis with either oral Truvada or long-acting aptitude injectable   Blood culture data: -Blood cultures positive for MRSA on 10/21, 10/22.   -Blood cultures 1/2 positive from 10/24. -Blood cultures 1/2 positive from 10/25. -Blood cultures 2 out of 2 positive from 10/27. -Blood cultures from 10/29 and finalized on 11/3   Data Reviewed: Basic Metabolic Panel: Recent Labs  Lab 07/23/21 0350  NA 136  K 4.2  CL 101  CO2 27  GLUCOSE 92  BUN 21*  CREATININE 0.66  CALCIUM 9.0     Scheduled Meds:   buprenorphine-naloxone  1.5 tablet Sublingual Daily   busPIRone  5 mg Oral TID   cetirizine HCl  5 mg Oral Daily   Chlorhexidine Gluconate Cloth  6 each Topical Daily   citalopram  20 mg Oral Daily   enoxaparin (LOVENOX) injection  40 mg Subcutaneous Q24H   famotidine  20 mg Oral Daily   feeding supplement  237 mL Oral TID BM   multivitamin with minerals  1 tablet Oral Daily   nicotine  21 mg Transdermal Daily   sodium chloride flush  10-40 mL Intracatheter Q12H   traZODone  150 mg Oral QHS   Continuous Infusions:  vancomycin 1,000 mg (07/24/21 2047)    Principal Problem:   MRSA bacteremia Active Problems:   Severe opioid use disorder (HCC)   Endocarditis   Septic pulmonary embolism (HCC)   Chronic viral hepatitis C ([place])   Shoulder abscess   Calf abscess   Odynophagia   Protein-calorie malnutrition, severe   Chest pain   Consultants: Infectious disease Cardiothoracic surgery  Procedures: TTE TEE  Antibiotics: Vancomycin 10/21 through 10/28 Valacyclovir 10/23 through 10/29 Daptomycin 10/28 >> 11/05 Ceftaroline 10/28 >> 11/05 Vancomycin 11/05 >>   Time spent: 15 minutes    Erin Hearing ANP  [facility] 7 am - 330 pm/M-F for direct patient care and secure chat Please refer to Amion for contact info 26  days

## 2021-07-25 NOTE — Progress Notes (Signed)
Pharmacy Antibiotic Note  Yvette Gaines is a 50 y.o. 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 Patient was previously not clearing her blood cultures and was put on combination therapy with daptomycin and ceftaroline for 7 days ending 11/3. Blood cultures from 10/29 with no growth (final). ECHO on 11/6 showed small residual vegetations on the anterior TV leaflet. Pharmacy was consulted for vancomycin dosing.  Renal function stable   Vanc peak 38 mcg/ml Vanc trough 8  mcg/ml (listed as peak in epic) Calculated AUC 480 (at goal), ke 0.075, t1/2 9 hrs    Plan: Continue vancomycin IV 1000 mg Q24H  Monitor  renal function, weekly vancomycin levels Plan to treat for 6 weeks weeks 10/29 - 12/10   Height: 5\' 2"  (157.5 cm) Weight: 47.4 kg (104 lb 8 oz) IBW/kg (Calculated) : 50.1 kg  Temp (24hrs), Avg:97.9 F (36.6 C), Min:97.6 F (36.4 C), Max:98.3 F (36.8 C)  Recent Labs  Lab 07/18/21 0318 07/18/21 1943 07/23/21 0350 07/24/21 1905 07/24/21 2303  CREATININE 0.74  --  0.66  --   --   VANCOTROUGH  --  7*  --   --   --   VANCOPEAK  --   --   --  8* 38     Estimated Creatinine Clearance: 63 mL/min (by C-G formula based on SCr of 0.66 mg/dL).    No Known Allergies  Anti-inflectives this admission: Valacyclovir 10/23 >> 10/29 Vancomycin 10/21 >> 10/28, restarted 11/4 >>(12/10) Daptomycin 10/28 >> 11/3 Ceftaroline 10/28 >> 11/3  Vancomycin levels  11/8 VP 33; 11/9 VT 7, AUC 443 > continue  11/15 VP 38: 11/15 VT 8 (listed as pk in epic), AUC 480 >continue    Microbiology results: 10/21 BCx: MRSA 3/3 10/22 BCx >> MRSA 2/2 10/24 BCx >> 1/3 MRSA  10/25 Bcx >> 1/4 MRSA 10/27 Bcx >> MRSA 4/4 10/29 BCx >> NG/final 10/21 Hepatitis A, Hepatitis B, HIV serologies: negative 10/21 Hepatitis C antibody: reactive 10/27 HCV quant: >50 IU/ml  Thank you for allowing pharmacy to be a part of this patient's care.  Sherlon Handing, PharmD, BCPS Please see amion for complete  clinical pharmacist phone list 07/25/2021 12:17 AM

## 2021-07-25 NOTE — Progress Notes (Signed)
[facility]  Date of Admission:  06/29/2021            ASSESSMENT:  Yvette Gaines continues to receive vancomycin without adverse side effects. Renal function has remained stable with no evidence of nephrotoxicity. Plan remains to continue vancomycin through 12/10. Continue to monitor renal function and vancomycin levels. Remaining medical and supportive care per Primary Team.   PLAN:  Continue current dose vancomycin through 08/18/21.  Therapeutic drug monitoring of vancomycin levels and renal function.  Remaining medical and supportive care per Primary Team.   Principal Problem:   MRSA bacteremia Active Problems:   Severe opioid use disorder (HCC)   Endocarditis   Septic pulmonary embolism (HCC)   Chronic viral hepatitis C (HCC)   Shoulder abscess   Calf abscess   Odynophagia   Protein-calorie malnutrition, severe   Chest pain    buprenorphine-naloxone  1.5 tablet Sublingual Daily   busPIRone  5 mg Oral TID   cetirizine HCl  5 mg Oral Daily   Chlorhexidine Gluconate Cloth  6 each Topical Daily   citalopram  20 mg Oral Daily   enoxaparin (LOVENOX) injection  40 mg Subcutaneous Q24H   famotidine  20 mg Oral Daily   feeding supplement  237 mL Oral TID BM   multivitamin with minerals  1 tablet Oral Daily   nicotine  21 mg Transdermal Daily   sodium chloride flush  10-40 mL Intracatheter Q12H   traZODone  150 mg Oral QHS    SUBJECTIVE:  Afebrile with no acute events. Doing well with no new concerns/complaints. Feeling well and denies fevers and chills.   No Known Allergies   Review of Systems: Review of Systems  Constitutional:  Negative for chills, fever and weight loss.  Respiratory:  Negative for cough, shortness of breath and wheezing.   Cardiovascular:  Negative for chest pain and leg swelling.  Gastrointestinal:  Negative for abdominal pain, constipation, diarrhea, nausea and vomiting.  Skin:  Negative for rash.      OBJECTIVE: Vitals:   07/24/21 0853 07/24/21 2110 07/25/21 0443 07/25/21 0759  BP: 124/78 108/69 100/68 120/78  Pulse: 75 81 80 74  Resp: 18  16 16   Temp: 97.8 F (36.6 C) 98.3 F (36.8 C) 98.4 F (36.9 C) 98.4 F (36.9 C)  TempSrc: Oral Oral Oral Oral  SpO2: 100% 100% 99% 100%  Weight:   45.6 kg   Height:       Body mass index is 18.39 kg/m.  Physical Exam Constitutional:      General: She is not in acute distress.    Appearance: She is well-developed.  Cardiovascular:     Rate and Rhythm: Normal rate and regular rhythm.     Heart sounds: Normal heart sounds.  Pulmonary:     Effort: Pulmonary effort is normal.     Breath sounds: Normal breath sounds.  Skin:    General: Skin is warm and dry.  Neurological:     Mental Status: She is alert and oriented to person, place, and time.  Psychiatric:        Behavior: Behavior normal.        Thought Content: Thought content normal.        Judgment: Judgment normal.    Lab Results Lab Results  Component Value Date   WBC 9.2 07/14/2021   HGB 8.4 (L) 07/14/2021   HCT 27.1 (L) 07/14/2021   MCV 90.6 07/14/2021   PLT 556 (H) 07/14/2021  Lab Results  Component Value Date   CREATININE 0.66 07/23/2021   BUN 21 (H) 07/23/2021   NA 136 07/23/2021   K 4.2 07/23/2021   CL 101 07/23/2021   CO2 27 07/23/2021    Lab Results  Component Value Date   ALT 38 07/14/2021   AST 24 07/14/2021   ALKPHOS 108 07/14/2021   BILITOT 0.5 07/14/2021     Microbiology: No results found for this or any previous visit (from the past 240 hour(s)).   Terri Piedra, NP [facility]  07/25/2021  10:25 AM

## 2021-07-25 NOTE — TOC Progression Note (Signed)
Transition of Care [facility]) - Progression Note    Patient Details  Name: Yvette Gaines MRN: [id number] Date of Birth: [birth year]  Transition of Care [facility]) CM/SW [place], RN Phone Number: 07/25/2021, 11:15 AM  Clinical Narrative:    CM met with the patient at the bedside to discuss transitions of care.  The patient does not have bed offers for SNF placement at this time.  The patient remains on Suboxone at this time.  SNF facilities are unable to prescribe and treat at the facilities involving Suboxone treatments. I spoke with Lenna Sciara, [place] at [facility] [phone] and she is aware that the patient will remain an inpatient to receive IV antibiotics and Suboxone at this time through 08/18/2021 since the patient does not have an available SNF bed offer for placement.  The patient is aware.  Patient states that she received a Medicaid card in the mail this week.  CM and MSW with DTP Team will continue to follow the patient for [facility] needs for discharge to home - pending date 08/18/2021.   Expected Discharge Plan: Skilled Nursing Facility Barriers to Discharge: Ship broker, Continued Medical Work up, SNF Pending bed offer  Expected Discharge Plan and Services Expected Discharge Plan: [place] In-house Referral: PCP / Psychologist, educational, Conservation officer, nature Services: CM Consult   Living arrangements for the past 2 months: Apartment                                       Social Determinants of Health (SDOH) Interventions    Readmission Risk Interventions No flowsheet data found.

## 2021-07-25 NOTE — Progress Notes (Signed)
   07/25/21 1505  Mobility  Activity Ambulated in hall  Range of Motion/Exercises Active;All extremities  Level of Assistance Independent  Assistive Device None  Distance Ambulated (ft) 1650 ft  Mobility Ambulated independently in hallway  Mobility Response Tolerated well  Mobility performed by Mobility specialist  Bed Position Semi-fowlers

## 2021-07-26 DIAGNOSIS — R7881 Bacteremia: Secondary | ICD-10-CM | POA: Diagnosis not present

## 2021-07-26 DIAGNOSIS — B9562 Methicillin resistant Staphylococcus aureus infection as the cause of diseases classified elsewhere: Secondary | ICD-10-CM | POA: Diagnosis not present

## 2021-07-26 MED ORDER — BUSPIRONE HCL 15 MG PO TABS
7.5000 mg | ORAL_TABLET | Freq: Three times a day (TID) | ORAL | Status: DC
  Administered 2021-07-26 – 2021-08-19 (×73): 7.5 mg via ORAL
  Filled 2021-07-26 (×73): qty 1

## 2021-07-26 NOTE — Progress Notes (Addendum)
[facility] HOSPITALISTS PROGRESS NOTE  Yvette Gaines YQI:[id number] DOB: [birth year] DOA: 06/29/2021 PCP: [name], DO  Status: Remains inpatient appropriate because:  Unsafe to discharge patient with history of IVDU with PICC line in place for outpatient IV therapy Patient requires a total of 6 weeks of IV antibiotics with blood cultures have as of 11/3 and last dose due 12/10  Barriers to discharge: Social: IVDU history so unable to discharge with a PICC line in place to complete antibiotic therapies in the outpatient environment/home setting  Clinical: Final blood cultures from 10/29 returned negative as of 11/3 Has done well on Suboxone therefore we will continue this medication until a bed offer obtained and an actual discharge date confirmed before transitioning over to short acting scheduled Oxy IR  Level of care:  Med-Surg   Code Status: Full Family Communication: Patient DVT prophylaxis: Lovenox COVID vaccination status: Unknown  HPI: 50 y.o. 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   Patient presented to [facility] on 10/05 for worsening of left shoulder swelling and pain.  Was found to have abscess of left shoulder and left calf,  I&D was done on 10/06 and culture showed MRSA.[name]  Patient however signed out [place] after the surgery. On 10/16, patient came back to [facility] complaining about new onset of chest pains and shortness of breath, CT chest showed multiple bilateral septic emboli, echocardiogram showed new onset of tricuspid vegetation compatible with endocarditis.  Blood culture showed again MRSA.  Patient was started on vancomycin and cefepime since. During hospital stay, patient was also found to develop acute thrombocytopenia, DIC was ruled out.   Patient was admitted to [facility], and started on IV vancomycin, TEE with tricuspid leaflet vegetation.   Subjective: Alert.  No complaints.  She did report increasing anxiety and desire not to increase Xanax.  Instead she requested BuSpar dose be increased  Objective: Vitals:   07/25/21 1945 07/26/21 0424  BP: 122/78 107/63  Pulse: 75 81  Resp: 17 17  Temp: 98.3 F (36.8 C) 98.4 F (36.9 C)  SpO2: 100% 100%   No intake or output data in the 24 hours ending 07/26/21 0758    Filed Weights   07/23/21 0500 07/25/21 0443 07/26/21 0424  Weight: 47.4 kg 45.6 kg 47.4 kg    Exam:  Constitutional: NAD, alert Respiratory: Bilateral posterior lung sounds CTA, room air, normal respiratory effort Cardiovascular: S1-S2, normotensive, no peripheral edema Abdomen: no tenderness or distention, soft. Bowel sounds positive.  LBM 11/16 Neurologic: CN 2-12 grossly intact. Sensation intact, Strength 5/5 x all 4 extremities.  Psychiatric: Normal judgment and insight. Alert and oriented x 3.     Assessment/Plan: Acute problems:   MRSA bacteremia Active Problems:   Severe opioid use disorder (HCC)   Endocarditis   Septic pulmonary embolism (HCC)   Chronic viral hepatitis C (HCC)   Shoulder abscess   Calf abscess   Odynophagia   Protein-calorie malnutrition, severe     MRSA bacteremia/MRSA TV endocarditis with moderate TVR/septic emboli/recent right shoulder and right calf abscess /status post I&D 10/6 (at [facility])  -TEE: tricuspid valve endocarditis.  CT surgery: not a candidate for angio VAC due to small size of vegetation - PICC placed on 11/3 -Narrowed to vancomycin 11/5-LD due 12/10  Pleuritic chest pain -11/5 repeat echocardiogram: Small residual  vegetation noted on the anterior TV leaflet. -CTA chest : Continued evolution of septic pulmonary emboli now globally increased surrounding consolidated opacifications which likely explains recent pleuritic chest  discomfort  Headache and sinus congestion -Continue Zyrtec and ibuprofen  Neck discomfort -Continue K pad   IVDU with narcotics -Continued on Suboxone this admission   -Continue Suboxone.  After bed offer secured and actual discharge date confirmed plan transition to Oxy IR 10 mg Q 6 hours as recommended by Pharmacist and discontinue Suboxone for duration of SNF stay.  -Recommend resume Suboxone once discharged from SNF. Previously received Suboxone at a clinic in [facility]  Depression and insomnia Continue preadmission Celexa, BuSpar and trazodone at bedtime  BuSpar dose increased to 7.5 mg TID on 11/17  Thrombocytopenia:  Resolved.   Recent right shoulder and calf abscess -Source of bacteremia -Status post I&D on 10/06 at [place] at outside hospital revealed no infectious involvement of AC joint    Severe protein calorie malnutrition Etiology: chronic illness and poor intake prior to admission secondary to IVDU Weight stable at 100.53 lbs  Mild intermittent asthma/current tobacco abuse -Currently asymptomatic -Continue prn bronchodilators and nicotine patch   Normocytic anemia with iron deficiency -Secondary to chronic malnutrition -Current hemoglobin 8.4 -Transfuse for hemoglobin </= 7.      +HCV AB -ID recommended treatment after discharge. -Also ID recommended HIV preexposure prophylaxis with either oral Truvada or long-acting aptitude injectable   Blood culture data: -Blood cultures positive for MRSA on 10/21, 10/22.   -Blood cultures 1/2 positive from 10/24. -Blood cultures 1/2 positive from 10/25. -Blood cultures 2 out of 2 positive from 10/27. -Blood cultures from 10/29 and finalized on 11/3   Data Reviewed: Basic Metabolic Panel: Recent Labs  Lab 07/23/21 0350  NA 136  K 4.2  CL 101  CO2 27  GLUCOSE 92  BUN 21*  CREATININE 0.66  CALCIUM 9.0     Scheduled Meds:  buprenorphine-naloxone  1.5 tablet Sublingual Daily    busPIRone  5 mg Oral TID   cetirizine HCl  5 mg Oral Daily   Chlorhexidine Gluconate Cloth  6 each Topical Daily   citalopram  20 mg Oral Daily   enoxaparin (LOVENOX) injection  40 mg Subcutaneous Q24H   famotidine  20 mg Oral Daily   feeding supplement  237 mL Oral TID BM   multivitamin with minerals  1 tablet Oral Daily   nicotine  21 mg Transdermal Daily   sodium chloride flush  10-40 mL Intracatheter Q12H   traZODone  150 mg Oral QHS   Continuous Infusions:  vancomycin 1,000 mg (07/25/21 2036)    Principal Problem:   MRSA bacteremia Active Problems:   Severe opioid use disorder (HCC)   Endocarditis   Septic pulmonary embolism (HCC)   Chronic viral hepatitis C ([place])   Shoulder abscess   Calf abscess   Odynophagia   Protein-calorie malnutrition, severe   Chest pain   Consultants: Infectious disease Cardiothoracic surgery  Procedures: TTE TEE  Antibiotics: Vancomycin 10/21 through 10/28 Valacyclovir 10/23 through 10/29 Daptomycin 10/28 >> 11/05 Ceftaroline 10/28 >> 11/05 Vancomycin 11/05 >>   Time spent: 15 minutes    Erin Hearing ANP  [facility] 7 am - 330 pm/M-F for direct patient care and secure chat Please refer to Amion for contact info 27  days

## 2021-07-27 DIAGNOSIS — R7881 Bacteremia: Secondary | ICD-10-CM | POA: Diagnosis not present

## 2021-07-27 DIAGNOSIS — B9562 Methicillin resistant Staphylococcus aureus infection as the cause of diseases classified elsewhere: Secondary | ICD-10-CM | POA: Diagnosis not present

## 2021-07-27 NOTE — Progress Notes (Signed)
[facility] HOSPITALISTS PROGRESS NOTE  Yvette Gaines PPJ:[id number] DOB: 1971/01/30 DOA: 06/29/2021 PCP: [name], DO  Status: Remains inpatient appropriate because:  Unsafe to discharge patient with history of IVDU with PICC line in place for outpatient IV therapy Patient requires a total of 6 weeks of IV antibiotics with blood cultures have as of 11/3 and last dose due 12/10  Barriers to discharge: Social: IVDU history so unable to discharge with a PICC line in place to complete antibiotic therapies in the outpatient environment/home setting  Clinical: Final blood cultures from 10/29 returned negative as of 11/3 Has done well on Suboxone therefore we will continue this medication until a bed offer obtained and an actual discharge date confirmed before transitioning over to short acting scheduled Oxy IR  Level of care:  Med-Surg   Code Status: Full Family Communication: Patient DVT prophylaxis: Lovenox COVID vaccination status: Unknown  HPI: 50 y.o. 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 seeking treatment.   Patient presented to [facility] on 10/05 for worsening of left shoulder swelling and pain.  Was found to have abscess of left shoulder and left calf,  I&D was done on 10/06 and culture showed MRSA.[name]  Patient however signed out [place] after the surgery. On 10/16, patient came back to [facility] complaining about new onset of chest pains and shortness of breath, CT chest showed multiple bilateral septic emboli, echocardiogram showed new onset of tricuspid vegetation compatible with endocarditis.  Blood culture showed again MRSA.  Patient was started on vancomycin and cefepime since. During hospital stay, patient was also found to develop acute thrombocytopenia, DIC was ruled out.   Patient was admitted to [facility], and started on IV vancomycin, TEE with tricuspid leaflet vegetation.   Subjective: Alert.  Had difficulty sleeping last night and also was having recurrent neck pain.  Objective: Vitals:   07/27/21 0441 07/27/21 0746  BP: 103/70 102/66  Pulse: 74 70  Resp: 18 18  Temp: 98.5 F (36.9 C) 98 F (36.7 C)  SpO2: 91% 100%    Intake/Output Summary (Last 24 hours) at 07/27/2021 0820 Last data filed at 07/27/2021 0400 Gross per 24 hour  Intake 1798.61 ml  Output --  Net 1798.61 ml      Filed Weights   07/25/21 0443 07/26/21 0424 07/27/21 0450  Weight: 45.6 kg 47.4 kg 46.8 kg    Exam:  Constitutional: NAD, alert Respiratory: Lung sounds CTA, room air, normal respiratory effort Cardiovascular: S1-S2, normotensive, no peripheral edema Abdomen: no tenderness or distention, soft. Bowel sounds positive.  LBM 11/17-eating well Neurologic: CN 2-12 grossly intact. Sensation intact, Strength 5/5 x all 4 extremities.  Psychiatric: Normal judgment and insight. Alert and oriented x 3.  Pleasant affect   Assessment/Plan: Acute problems:   MRSA bacteremia Active Problems:   Severe opioid use disorder ([place])   Endocarditis   Septic pulmonary embolism (HCC)   Chronic viral hepatitis C (HCC)   Shoulder abscess   Calf abscess   Odynophagia   Protein-calorie malnutrition, severe     MRSA bacteremia/MRSA TV endocarditis with moderate TVR/septic emboli/recent right shoulder and right calf abscess /status post I&D 10/6 (at [facility])  -TEE: tricuspid valve endocarditis.  CT surgery: not a candidate for angio VAC due to small size of vegetation - PICC placed on 11/3 -Narrowed to vancomycin 11/5-LD due  12/10  Pleuritic chest pain -11/5 repeat echocardiogram: Small residual vegetation noted on the anterior TV leaflet. -CTA chest : Continued evolution of septic pulmonary emboli now globally increased surrounding consolidated opacifications which  likely explains recent pleuritic chest discomfort  Headache and sinus congestion -Continue Zyrtec and ibuprofen  Neck discomfort -Continue K pad   IVDU with narcotics -Continued on Suboxone this admission   -Continue Suboxone.  After bed offer secured and actual discharge date confirmed plan transition to Oxy IR 10 mg Q 6 hours as recommended by Pharmacist and discontinue Suboxone for duration of SNF stay.  -Recommend resume Suboxone once discharged from SNF. Previously received Suboxone at a clinic in [facility]  Depression and insomnia Continue preadmission Celexa, BuSpar and trazodone at bedtime  BuSpar dose increased to 7.5 mg TID on 11/17  Thrombocytopenia:  Resolved.   Recent right shoulder and calf abscess -Source of bacteremia -Status post I&D on 10/[facility] outside hospital revealed no infectious involvement of AC joint    Severe protein calorie malnutrition Etiology: chronic illness and poor intake prior to admission secondary to IVDU Weight stable at 100.53 lbs  Mild intermittent asthma/current tobacco abuse -Currently asymptomatic -Continue prn bronchodilators and nicotine patch   Normocytic anemia with iron deficiency -Secondary to chronic malnutrition -Current hemoglobin 8.4 -Transfuse for hemoglobin </= 7.      +HCV AB -ID recommended treatment after discharge. -Also ID recommended HIV preexposure prophylaxis with either oral Truvada or long-acting aptitude injectable   Blood culture data: -Blood cultures positive for MRSA on 10/21, 10/22.   -Blood cultures 1/2 positive from 10/24. -Blood cultures 1/2 positive from 10/25. -Blood cultures 2 out of 2 positive from 10/27. -Blood cultures from 10/29 and finalized on 11/3   Data Reviewed: Basic Metabolic Panel: Recent Labs  Lab 07/23/21 0350  NA 136  K 4.2  CL 101  CO2 27  GLUCOSE 92  BUN 21*  CREATININE 0.66  CALCIUM 9.0     Scheduled Meds:   buprenorphine-naloxone  1.5 tablet Sublingual Daily   busPIRone  7.5 mg Oral TID   cetirizine HCl  5 mg Oral Daily   Chlorhexidine Gluconate Cloth  6 each Topical Daily   citalopram  20 mg Oral Daily   enoxaparin (LOVENOX) injection  40 mg Subcutaneous Q24H   famotidine  20 mg Oral Daily   feeding supplement  237 mL Oral TID BM   multivitamin with minerals  1 tablet Oral Daily   nicotine  21 mg Transdermal Daily   sodium chloride flush  10-40 mL Intracatheter Q12H   traZODone  150 mg Oral QHS   Continuous Infusions:  vancomycin 1,000 mg (07/26/21 1943)    Principal Problem:   MRSA bacteremia Active Problems:   Severe opioid use disorder ([place])   Endocarditis   Septic pulmonary embolism (HCC)   Chronic viral hepatitis C ([place])   Shoulder abscess   Calf abscess   Odynophagia   Protein-calorie malnutrition, severe   Chest pain   Consultants: Infectious disease Cardiothoracic surgery  Procedures: TTE TEE  Antibiotics: Vancomycin 10/21 through 10/28 Valacyclovir 10/23 through 10/29 Daptomycin 10/28 >> 11/05 Ceftaroline 10/28 >> 11/05 Vancomycin 11/05 >>   Time spent: 15 minutes    Erin Hearing ANP  [facility] 7 am - 330 pm/M-F for direct patient care and secure chat Please refer to Amion for contact info 28  days

## 2021-07-28 DIAGNOSIS — L02419 Cutaneous abscess of limb, unspecified: Secondary | ICD-10-CM | POA: Diagnosis not present

## 2021-07-28 DIAGNOSIS — B182 Chronic viral hepatitis C: Secondary | ICD-10-CM | POA: Diagnosis not present

## 2021-07-28 DIAGNOSIS — E43 Unspecified severe protein-calorie malnutrition: Secondary | ICD-10-CM | POA: Diagnosis not present

## 2021-07-28 DIAGNOSIS — R7881 Bacteremia: Secondary | ICD-10-CM | POA: Diagnosis not present

## 2021-07-28 NOTE — Progress Notes (Signed)
PROGRESS NOTE  Yvette Gaines IEP:[id number] DOB: December 14, 1970   PCP: [name], DO  Patient is from: Home.  DOA: 06/29/2021 LOS: 23  Chief complaints:  Chief Complaint  Patient presents with   Chest Pain   Weakness   Shortness of Breath   Drug Problem     Brief Narrative / Interim history: 50 year old F with PMH of intermittent asthma, IVDU with opiates and recent diagnosis of MRSA bacteremia with tricuspid valve endocarditis and bilateral pulmonary septic emboli presenting with left shoulder swelling and pain.  Patient presented to [facility] on 10/5 with left shoulder and left calf swelling and pain, and underwent I&D on 10/6 but left AMA.  Deep tissue culture with MRSA.  She returned to [facility] on 10/16 with new onset chest pain and shortness of breath and found to have bilateral septic emboli.  Blood culture with MRSA.  Echo revealed kidney vegetation.  Patient left AMA again and presented to [facility] to seek treatment on 10/2.  Initially started on IV vancomycin but changed to daptomycin and ceftaroline due to persistent bacteremia.  Eventually cleared and switched back to IV vancomycin on 10/29.  She will remain in-house on IV vancomycin through 08/18/2021.   Subjective: Seen and examined earlier this morning.  No major events overnight of this morning.  No complaints.  Objective: Vitals:   07/27/21 1949 07/28/21 0518 07/28/21 0540 07/28/21 0938  BP: 120/81 110/65  103/73  Pulse: 80 77  76  Resp: 17 16  18   Temp: 98.3 F (36.8 C) 98.2 F (36.8 C)  98.3 F (36.8 C)  TempSrc: Oral Oral  Oral  SpO2: 100% 100%  100%  Weight:   49 kg   Height:        Intake/Output Summary (Last 24 hours) at 07/28/2021 1625 Last data filed at 07/27/2021 2100 Gross per 24 hour  Intake 720 ml  Output --  Net 720 ml   Filed Weights   07/26/21 0424 07/27/21 0450 07/28/21 0540  Weight: 47.4 kg 46.8 kg 49 kg    Examination:  GENERAL: No apparent distress.  Nontoxic. HEENT: MMM.  Vision  and hearing grossly intact.  NECK: Supple.  No apparent JVD.  RESP:  No IWOB.  Fair aeration bilaterally. CVS:  RRR. Heart sounds normal.  ABD/GI/GU: BS+. Abd soft, NTND.  MSK/EXT:  Moves extremities. No apparent deformity. No edema.  SKIN: no apparent skin lesion or wound NEURO: Awake and alert. Oriented appropriately.  No apparent focal neuro deficit. PSYCH: Calm. Normal affect.   Procedures:  10/25-TEE with normal LVEF, 1.35 x 0.582 cm mobile TV vegetation  Microbiology summarized: 10/21-COVID-19 and influenza PCR nonreactive. 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 Bilateral pulmonary septic emboli Right shoulder and right calf abscess-s/p ID in [facility].  Reportedly fluid culture with MRSA.  Resolving -Vancomycin>> daptomycin and ceftaroline 10/28>> vancomycin 11/4>>> 08/18/2021 -PICC line placed on 07/12/2021 -Patient to continue IV antibiotics in-house due to IVDU   Pleuritic chest pain-likely due to the above.  Resolved.  Headache and sinus congestion: Resolved. -Continue Zyrtec and saline nose spray  IVDU with narcotics-continue Suboxone while in-house.   -Apparently, Suboxone was a barrier for discharge to SNF.  Plan is to discharge on Oxy IR 10 mg every 6 hours when ready to leave to SNF, then follow-up at her prvious Suboxone clinic when discharged from SNF.  Depression and insomnia: Stable. -Continue home  Celexa, BuSpar and trazodone at bedtime    Thrombocytopenia: Resolved.   Mild intermittent asthma/current tobacco abuse: Stable. -Encouraged tobacco cessation -Continue nicotine patch   Normocytic anemia: Anemia panel suggest this anemia of chronic disease.  H&H stable. Recent Labs    06/29/21 0924 06/30/21 0127 07/01/21 0155 07/02/21 0101 07/04/21 0135 07/11/21 0150 07/14/21 0426  HGB 8.5* 7.6* 7.1* 8.1* 7.8* 8.9* 8.4*  -Monitor  H&H -Transfuse for Hgb less than 7   Positive hepatitis C antibody -ID recommended treatment after discharge.   Severe protein calorie malnutrition Body mass index is 19.76 kg/m. Nutrition Problem: Severe Malnutrition Etiology: chronic illness, cancer and cancer related treatments Signs/Symptoms: severe fat depletion, severe muscle depletion Interventions: MVI, Ensure Enlive (each supplement provides 350kcal and 20 grams of protein)   DVT prophylaxis:  enoxaparin (LOVENOX) injection 40 mg Start: 06/30/21 2005 Place and maintain sequential compression device Start: 06/29/21 1318  Code Status: Full code Family Communication: Patient and/or RN. Available if any question.  Level of care: Med-Surg Status is: Inpatient  Remains inpatient appropriate because: Needs IV antibiotics for MRSA bacteremia.  Not eligible for home IV antibiotics due to IVDU   Consultants:  Infectious disease Cardiology   Sch Meds:  Scheduled Meds:  buprenorphine-naloxone  1.5 tablet Sublingual Daily   busPIRone  7.5 mg Oral TID   cetirizine HCl  5 mg Oral Daily   Chlorhexidine Gluconate Cloth  6 each Topical Daily   citalopram  20 mg Oral Daily   enoxaparin (LOVENOX) injection  40 mg Subcutaneous Q24H   famotidine  20 mg Oral Daily   feeding supplement  237 mL Oral TID BM   multivitamin with minerals  1 tablet Oral Daily   nicotine  21 mg Transdermal Daily   sodium chloride flush  10-40 mL Intracatheter Q12H   traZODone  150 mg Oral QHS   Continuous Infusions:  vancomycin 1,000 mg (07/27/21 1946)   PRN Meds:.acetaminophen **OR** acetaminophen, albuterol, ALPRAZolam, hydrocortisone cream, ibuprofen, influenza vac split quadrivalent PF, ondansetron **OR** ondansetron (ZOFRAN) IV, sodium chloride flush  Antimicrobials: Anti-infectives (From admission, onward)    Start     Dose/Rate Route Frequency Ordered Stop   07/14/21 2000  vancomycin (VANCOCIN) IVPB 1000 mg/200 mL premix        1,000 mg 200  mL/hr over 60 Minutes Intravenous Every 24 hours 07/12/21 1400     07/13/21 2000  vancomycin (VANCOREADY) IVPB 1250 mg/250 mL        1,250 mg 166.7 mL/hr over 90 Minutes Intravenous  Once 07/12/21 1400 07/13/21 2326   07/06/21 1400  ceftaroline (TEFLARO) 600 mg in sodium chloride 0.9 % 100 mL IVPB        600 mg 100 mL/hr over 60 Minutes Intravenous Every 8 hours 07/06/21 1033 07/12/21 2229   07/06/21 1330  DAPTOmycin (CUBICIN) 500 mg in sodium chloride 0.9 % IVPB        500 mg 120 mL/hr over 30 Minutes Intravenous Daily 07/06/21 1233 07/12/21 2022   07/01/21 2200  vancomycin (VANCOREADY) IVPB 750 mg/150 mL  Status:  Discontinued        750 mg 150 mL/hr over 60 Minutes Intravenous Every 12 hours 07/01/21 1314 07/06/21 1033   07/01/21 1400  valACYclovir (VALTREX) tablet 1,000 mg        1,000 mg Oral 2 times daily 07/01/21 1306 07/08/21 0959   06/30/21 0200  vancomycin (VANCOREADY) IVPB 750 mg/150 mL  Status:  Discontinued        750 mg  150 mL/hr over 60 Minutes Intravenous Every 12 hours 06/29/21 1253 06/29/21 1254   06/29/21 1700  vancomycin (VANCOREADY) IVPB 750 mg/150 mL  Status:  Discontinued        750 mg 150 mL/hr over 60 Minutes Intravenous Every 12 hours 06/29/21 1308 07/01/21 1301   06/29/21 1615  rifampin (RIFADIN) capsule 300 mg  Status:  Discontinued        300 mg Oral Daily 06/29/21 1606 06/29/21 1607   06/29/21 1130  vancomycin (VANCOREADY) IVPB 1250 mg/250 mL  Status:  Discontinued        1,250 mg 166.7 mL/hr over 90 Minutes Intravenous  Once 06/29/21 1124 06/29/21 1254   06/29/21 1130  ceFEPIme (MAXIPIME) 2 g in sodium chloride 0.9 % 100 mL IVPB  Status:  Discontinued        2 g 200 mL/hr over 30 Minutes Intravenous  Once 06/29/21 1124 06/29/21 1251        I have personally reviewed the following labs and images: CBC: No results for input(s): WBC, NEUTROABS, HGB, HCT, MCV, PLT in the last 168 hours. BMP &GFR Recent Labs  Lab 07/23/21 0350  NA 136  K 4.2  CL  101  CO2 27  GLUCOSE 92  BUN 21*  CREATININE 0.66  CALCIUM 9.0   Estimated Creatinine Clearance: 65.1 mL/min (by C-G formula based on SCr of 0.66 mg/dL). Liver & Pancreas: No results for input(s): AST, ALT, ALKPHOS, BILITOT, PROT, ALBUMIN in the last 168 hours. No results for input(s): LIPASE, AMYLASE in the last 168 hours. No results for input(s): AMMONIA in the last 168 hours. Diabetic: No results for input(s): HGBA1C in the last 72 hours. No results for input(s): GLUCAP in the last 168 hours. Cardiac Enzymes: No results for input(s): CKTOTAL, CKMB, CKMBINDEX, TROPONINI in the last 168 hours. No results for input(s): PROBNP in the last 8760 hours. Coagulation Profile: No results for input(s): INR, PROTIME in the last 168 hours. Thyroid Function Tests: No results for input(s): TSH, T4TOTAL, FREET4, T3FREE, THYROIDAB in the last 72 hours. Lipid Profile: No results for input(s): CHOL, HDL, LDLCALC, TRIG, CHOLHDL, LDLDIRECT in the last 72 hours. Anemia Panel: No results for input(s): VITAMINB12, FOLATE, FERRITIN, TIBC, IRON, RETICCTPCT in the last 72 hours. Urine analysis:    Component Value Date/Time   COLORURINE YELLOW (A) 07/04/2015 1709   APPEARANCEUR CLOUDY (A) 07/04/2015 1709   LABSPEC 1.015 07/04/2015 1709   PHURINE 5.0 07/04/2015 1709   GLUCOSEU NEGATIVE 07/04/2015 1709   HGBUR NEGATIVE 07/04/2015 1709   BILIRUBINUR NEGATIVE 07/04/2015 1709   KETONESUR NEGATIVE 07/04/2015 1709   PROTEINUR NEGATIVE 07/04/2015 1709   UROBILINOGEN 0.2 06/28/2015 1150   NITRITE NEGATIVE 07/04/2015 1709   LEUKOCYTESUR NEGATIVE 07/04/2015 1709   Sepsis Labs: Invalid input(s): PROCALCITONIN, [place]  Microbiology: No results found for this or any previous visit (from the past 240 hour(s)).  Radiology Studies: No results found.    Joella Saefong T. [place]  If 7PM-7AM, please contact night-coverage [email or web address] 07/28/2021, 4:25 PM

## 2021-07-28 NOTE — Progress Notes (Signed)
Pharmacy Antibiotic Note  Yvette Gaines is a 50 y.o. 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 Patient was previously not clearing her blood cultures and was put on combination therapy with daptomycin and ceftaroline for 7 days ending 11/3. Blood cultures from 10/29 with no growth (final). ECHO on 11/6 showed small residual vegetations on the anterior TV leaflet. Pharmacy was consulted for vancomycin dosing.  Renal function stable  Most recent levels 11/15:  Vanc peak 38 mcg/ml Vanc trough 8  mcg/ml (listed as peak in epic) Calculated AUC 480 (at goal), ke 0.075, t1/2 9 hrs    Plan: Continue vancomycin IV 1000 mg Q24H  Monitor renal function, weekly vancomycin levels (next levels 11/22) Plan to treat for 6 weeks weeks 10/29 - 12/10   Height: 5\' 2"  (157.5 cm) Weight: 49 kg (108 lb 0.4 oz) IBW/kg (Calculated) : 50.1 kg  Temp (24hrs), Avg:98.3 F (36.8 C), Min:98.2 F (36.8 C), Max:98.3 F (36.8 C)  Recent Labs  Lab 07/23/21 0350 07/24/21 1905 07/24/21 2303  CREATININE 0.66  --   --   VANCOPEAK  --  8* 38     Estimated Creatinine Clearance: 65.1 mL/min (by C-G formula based on SCr of 0.66 mg/dL).    No Known Allergies  Anti-inflectives this admission: Valacyclovir 10/23 >> 10/29 Vancomycin 10/21 >> 10/28, restarted 11/4 >>(12/10) Daptomycin 10/28 >> 11/3 Ceftaroline 10/28 >> 11/3  Vancomycin levels  11/8 VP 33; 11/9 VT 7, AUC 443 > continue  11/15 VP 38: 11/15 VT 8 (listed as pk in epic), AUC 480 >continue    Microbiology results: 10/21 BCx: MRSA 3/3 10/22 BCx >> MRSA 2/2 10/24 BCx >> 1/3 MRSA  10/25 Bcx >> 1/4 MRSA 10/27 Bcx >> MRSA 4/4 10/29 BCx >> NG/final 10/21 Hepatitis A, Hepatitis B, HIV serologies: negative 10/21 Hepatitis C antibody: reactive 10/27 HCV quant: >50 IU/ml  Thank you for allowing pharmacy to be a part of this patient's care.  Cephus Slater, PharmD, MBA Pharmacy Resident [phone] 07/28/2021 9:47 AM

## 2021-07-29 LAB — CREATININE, SERUM
Creatinine, Ser: 0.64 mg/dL (ref 0.44–1.00)
GFR, Estimated: >60 mL/min (ref >60)

## 2021-07-29 NOTE — Progress Notes (Signed)
  PROGRESS NOTE  Brief rounding note.  Patient was admitted with MRSA bacteremia, endocarditis and remains inpatient for IV vancomycin through 08/18/2021.  Patient was seen and examined.  She is resting comfortably sitting in bed, no major events reported overnight, patient without any new complaints or concerns today.  Vital signs remained stable.  Creatinine normal.   Yvette Phi, DO [facility] 07/29/2021, 1:30 PM  Available via Epic secure chat 7am-7pm After these hours, please refer to coverage provider listed on [email or web address]

## 2021-07-30 DIAGNOSIS — B9562 Methicillin resistant Staphylococcus aureus infection as the cause of diseases classified elsewhere: Secondary | ICD-10-CM | POA: Diagnosis not present

## 2021-07-30 DIAGNOSIS — R7881 Bacteremia: Secondary | ICD-10-CM | POA: Diagnosis not present

## 2021-07-30 LAB — VANCOMYCIN, PEAK: Vancomycin Pk: 36 ug/mL (ref 30–40)

## 2021-07-30 NOTE — Progress Notes (Signed)
[facility] HOSPITALISTS PROGRESS NOTE  Yvette Gaines XBM:[id number] DOB: 12/13/1970 DOA: 06/29/2021 PCP: [name], DO  Status: Remains inpatient appropriate because:  Unsafe to discharge patient with history of IVDU with PICC line in place for outpatient IV therapy Patient requires a total of 6 weeks of IV antibiotics with blood cultures have as of 11/3 and last dose due 12/10  Barriers to discharge: Social: IVDU history so unable to discharge with a PICC line in place to complete antibiotic therapies in the outpatient environment/home setting  Clinical: Final blood cultures from 10/29 returned negative as of 11/3 Has done well on Suboxone therefore we will continue this medication until a bed offer obtained and an actual discharge date confirmed before transitioning over to short acting scheduled Oxy IR  Level of care:  Med-Surg   Code Status: Full Family Communication: Patient DVT prophylaxis: Lovenox COVID vaccination status: Unknown  HPI: 50 y.o. 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   Patient presented to [facility] on 10/05 for worsening of left shoulder swelling and pain.  Was found to have abscess of left shoulder and left calf,  I&D was done on 10/06 and culture showed MRSA.[name]  Patient however signed out [place] after the surgery. On 10/16, patient came back to [facility] complaining about new onset of chest pains and shortness of breath, CT chest showed multiple bilateral septic emboli, echocardiogram showed new onset of tricuspid vegetation compatible with endocarditis.  Blood culture showed again MRSA.  Patient was started on vancomycin and cefepime since. During hospital stay, patient was also found to develop acute thrombocytopenia, DIC was ruled out.   Patient was admitted to [facility], and started on IV vancomycin, TEE with tricuspid leaflet vegetation.   Subjective: Awake and alert.  Continues to have focal left lateral upper neck pain.  Objective: Vitals:   07/29/21 2053 07/30/21 0502  BP: 126/82 112/70  Pulse: 72 73  Resp: 16 16  Temp: 98.4 F (36.9 C) 98.1 F (36.7 C)  SpO2: 100% 100%   No intake or output data in the 24 hours ending 07/30/21 0821     Filed Weights   07/27/21 0450 07/28/21 0540 07/29/21 0500  Weight: 46.8 kg 49 kg 48.9 kg    Exam:  Constitutional: NAD, alert ENT: Has slightly tender easily palpable mobile lymph node upper left lateral neck, no associated soft tissue swelling and no skin redness Respiratory: Lung sounds CTA, room air, normal respiratory effort-pulse oximetry 99% Cardiovascular: S1-S2, pulse regular, normotensive, no peripheral edema Abdomen: no tenderness or distention, soft. Bowel sounds positive.  LBM 11/17-eating well Neurologic: CN 2-12 grossly intact. Sensation intact, Strength 5/5 x all 4 extremities.  Psychiatric: Normal judgment and insight. Alert and oriented x 3.  Pleasant affect   Assessment/Plan: Acute problems: MRSA bacteremia/MRSA TV endocarditis with moderate TVR/septic emboli/recent right shoulder and right calf abscess /status post I&D 10/6 (at [facility])  -TEE: tricuspid valve endocarditis.  CT surgery: not a candidate for angio VAC due to small size of vegetation - PICC placed on 11/3 -Narrowed to vancomycin 11/5-LD due 12/10  Pleuritic chest pain Resolved -11/5 echocardiogram: Small residual vegetation noted on the anterior TV leaflet. -CTA chest : Continued evolution of septic pulmonary emboli now globally increased surrounding consolidated opacifications which likely explains recent pleuritic chest discomfort  Headache and sinus  congestion -Continue Zyrtec and ibuprofen prn  Neck discomfort -Continue K pad   IVDU with narcotics -Continue  Suboxone.  After bed offer secured and actual discharge date confirmed plan transition to Oxy IR 10 mg Q 6 hours as recommended by Pharmacist and discontinue Suboxone for duration of SNF stay.  -Recommend resume Suboxone once discharged from SNF. Previously received Suboxone at a clinic in [facility]  Depression and insomnia Continue preadmission Celexa, BuSpar and trazodone at bedtime  BuSpar dose increased to 7.5 mg TID on 11/17  Thrombocytopenia:  Resolved.   Recent right shoulder and calf abscess -Source of bacteremia -Status post I&D on 10/06 at [place] at outside hospital revealed no infectious involvement of AC joint    Severe protein calorie malnutrition Etiology: chronic illness and poor intake prior to admission secondary to IVDU Weight stable at 100.53 lbs  Mild intermittent asthma/current tobacco abuse -Currently asymptomatic -Continue prn bronchodilators and nicotine patch   Normocytic anemia with iron deficiency -Secondary to chronic malnutrition -Current hemoglobin 8.4 -Transfuse for hemoglobin </= 7.      +HCV AB -ID recommended treatment after discharge. -Also ID recommended HIV preexposure prophylaxis with either oral Truvada or long-acting aptitude injectable   Blood culture data: -Blood cultures positive for MRSA on 10/21, 10/22.   -Blood cultures 1/2 positive from 10/24. -Blood cultures 1/2 positive from 10/25. -Blood cultures 2 out of 2 positive from 10/27. -Blood cultures from 10/29 and finalized on 11/3   Data Reviewed: Basic Metabolic Panel: Recent Labs  Lab 07/29/21 0357  CREATININE 0.64     Scheduled Meds:  buprenorphine-naloxone  1.5 tablet Sublingual Daily   busPIRone  7.5 mg Oral TID   cetirizine HCl  5 mg Oral Daily   Chlorhexidine Gluconate Cloth  6 each Topical Daily   citalopram  20 mg Oral Daily   enoxaparin (LOVENOX) injection  40 mg Subcutaneous Q24H   famotidine  20 mg Oral Daily   feeding supplement   237 mL Oral TID BM   multivitamin with minerals  1 tablet Oral Daily   nicotine  21 mg Transdermal Daily   sodium chloride flush  10-40 mL Intracatheter Q12H   traZODone  150 mg Oral QHS   Continuous Infusions:  vancomycin 1,000 mg (07/29/21 2014)    Principal Problem:   MRSA bacteremia Active Problems:   Severe opioid use disorder ([place])   Endocarditis   Septic pulmonary embolism (HCC)   Chronic viral hepatitis C ([place])   Shoulder abscess   Calf abscess   Odynophagia   Protein-calorie malnutrition, severe   Chest pain   Consultants: Infectious disease Cardiothoracic surgery  Procedures: TTE TEE  Antibiotics: Vancomycin 10/21 through 10/28 Valacyclovir 10/23 through 10/29 Daptomycin 10/28 >> 11/05 Ceftaroline 10/28 >> 11/05 Vancomycin 11/05 >>   Time spent: 15 minutes    Erin Hearing ANP  [facility] 7 am - 330 pm/M-F for direct patient care and secure chat Please refer to Amion for contact info 31  days

## 2021-07-30 NOTE — Progress Notes (Signed)
  Mobility Specialist Criteria Algorithm Info.  07/30/21 1405  Mobility  Activity Ambulated in hall  Range of Motion/Exercises Active;All extremities  Level of Assistance Independent  Assistive Device None  Distance Ambulated (ft) 2200 ft  Mobility Ambulated with assistance in hallway;Ambulated independently in hallway  Mobility Response Tolerated well  Mobility performed by Mobility specialist  Bed Position Semi-fowlers      Patient ambulated in hallway independently with steady gait. Tolerated ambulation well without complaint or incident.    07/30/2021 2:05 PM

## 2021-07-31 DIAGNOSIS — R7881 Bacteremia: Secondary | ICD-10-CM | POA: Diagnosis not present

## 2021-07-31 DIAGNOSIS — B9562 Methicillin resistant Staphylococcus aureus infection as the cause of diseases classified elsewhere: Secondary | ICD-10-CM | POA: Diagnosis not present

## 2021-07-31 LAB — CREATININE, SERUM
Creatinine, Ser: 0.72 mg/dL (ref 0.44–1.00)
GFR, Estimated: >60 mL/min (ref >60)

## 2021-07-31 LAB — VANCOMYCIN, TROUGH: Vancomycin Tr: 7 ug/mL — ABNORMAL LOW (ref 15–20)

## 2021-07-31 MED ORDER — METHOCARBAMOL 500 MG PO TABS
500.0000 mg | ORAL_TABLET | Freq: Three times a day (TID) | ORAL | Status: DC
  Administered 2021-07-31 – 2021-08-07 (×24): 500 mg via ORAL
  Filled 2021-07-31 (×25): qty 1

## 2021-07-31 MED ORDER — DICLOFENAC SODIUM 1 % EX GEL
4.0000 g | Freq: Four times a day (QID) | CUTANEOUS | Status: DC
  Administered 2021-07-31 – 2021-08-17 (×29): 4 g via TOPICAL
  Filled 2021-07-31 (×2): qty 100

## 2021-07-31 NOTE — Progress Notes (Signed)
Nutrition Follow-up  DOCUMENTATION CODES:   Severe malnutrition in context of chronic illness  INTERVENTION:   Continue Multivitamin w/ minerals daily Continue Ensure Enlive po TID, each supplement provides 350 kcal and 20 grams of protein PLDN to order afternoon snack for pt Encourage good PO intake   NUTRITION DIAGNOSIS:   Severe Malnutrition related to chronic illness, cancer and cancer related treatments as evidenced by severe fat depletion, severe muscle depletion. 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  Pt reports that her appetite is still good, reports that she is still getting hungry around 8 pm. Pt asked PLDN to order snacks, pt provided PLDN with items that she would like for snacks.  Per EMR, pt intake is 100% for all meals recorded between 11/18 and 11/22.   Pt with no other concerns or questions at this time.  Continued inpatient due to needing IV antibiotics until 12/10.  Medications reviewed and include: Pepcid, MVI, IV antibiotics Labs reviewed.  Diet Order:   Diet Order             Diet regular Room service appropriate? Yes; Fluid consistency: Thin  Diet effective now                   EDUCATION NEEDS:   No education needs have been identified at this time  Skin:  Skin Assessment: Skin Integrity Issues: Skin Integrity Issues:: Other (Comment) Other: Skin tear - L leg  Last BM:  07/30/2021  Height:   Ht Readings from Last 1 Encounters:  07/03/21 5\' 2"  (1.575 m)    Weight:   Wt Readings from Last 1 Encounters:   07/31/21 47.5 kg    Ideal Body Weight:  50 kg  BMI:  Body mass index is 19.15 kg/m.  Estimated Nutritional Needs:   Kcal:  1700-1900  Protein:  85-100 grams  Fluid:  > 1.7 L    Yvette Gaines BS, PLDN Clinical Dietitian See AMiON for contact information.

## 2021-07-31 NOTE — TOC Progression Note (Signed)
Transition of Care [facility]) - Progression Note    Patient Details  Name: Yvette Gaines MRN: [id number] Date of Birth: 12/27/1970  Transition of Care [facility]) CM/SW [place], RN Phone Number: 07/31/2021, 3:06 PM  Clinical Narrative:    CM called and spoke with Lenna Sciara, [place] at [facility] and the manager is aware that inpatient stay will be provided at the hospital through 08/18/2021 since SNF facility was unable to be coordinated to provide care to the patient including IV antibiotics through PICC line and suboxone treatment.  The patient has recent history of IV drug abuse and is not a safe discharge plan to home.  [facility] was unable to establish SNF placement outside of [facility] case management search for SNF placement as well.  CM and MSW will continue to follow the patient for pending discharge to home on 08/18/2021 after IV antibiotics ordered by infectious disease attending physician.   Expected Discharge Plan: Skilled Nursing Facility Barriers to Discharge: Ship broker, Continued Medical Work up, SNF Pending bed offer  Expected Discharge Plan and Services Expected Discharge Plan: [place] In-house Referral: PCP / Psychologist, educational, Conservation officer, nature Services: CM Consult   Living arrangements for the past 2 months: Apartment                                       Social Determinants of Health (SDOH) Interventions    Readmission Risk Interventions No flowsheet data found.

## 2021-07-31 NOTE — Progress Notes (Signed)
[facility] HOSPITALISTS PROGRESS NOTE  Yvette Gaines PJK:[id number] DOB: Jan 19, 1971 DOA: 06/29/2021 PCP: [name], DO  Status: Remains inpatient appropriate because:  Unsafe to discharge patient with history of IVDU with PICC line in place for outpatient IV therapy Patient requires a total of 6 weeks of IV antibiotics with blood cultures have as of 11/3 and last dose due 12/10  Barriers to discharge: Social: IVDU history so unable to discharge with a PICC line in place to complete antibiotic therapies in the outpatient environment/home setting  Clinical: Final blood cultures from 10/29 returned negative as of 11/3 Has done well on Suboxone therefore we will continue this medication until a bed offer obtained and an actual discharge date confirmed before transitioning over to short acting scheduled Oxy IR  Level of care:  Med-Surg   Code Status: Full Family Communication: Patient DVT prophylaxis: Lovenox COVID vaccination status: Unknown  HPI: 50 y.o. 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 seeking treatment.   Patient presented to [facility] on 10/05 for worsening of left shoulder swelling and pain.  Was found to have abscess of left shoulder and left calf,  I&D was done on 10/06 and culture showed MRSA.[name]  Patient however signed out [place] after the surgery. On 10/16, patient came back to [facility] complaining about new onset of chest pains and shortness of breath, CT chest showed multiple bilateral septic emboli, echocardiogram showed new onset of tricuspid vegetation compatible with endocarditis.  Blood culture showed again MRSA.  Patient was started on vancomycin and cefepime since. During hospital stay, patient was also found to develop acute thrombocytopenia, DIC was ruled out.   Patient was admitted to [facility], and started on IV vancomycin, TEE with tricuspid leaflet vegetation.   Subjective: Reporting new onset of lower right abdomen hematoma after Lovenox injection.  Continues to have left neck discomfort.  Objective: Vitals:   07/30/21 2018 07/31/21 0521  BP: 109/65 105/71  Pulse: 73 75  Resp: 16 16  Temp: 97.9 F (36.6 C) 98 F (36.7 C)  SpO2: 100% 100%    Intake/Output Summary (Last 24 hours) at 07/31/2021 8099 Last data filed at 07/30/2021 1430 Gross per 24 hour  Intake 480 ml  Output --  Net 480 ml       Filed Weights   07/28/21 0540 07/29/21 0500 07/31/21 0521  Weight: 49 kg 48.9 kg 47.5 kg    Exam:  Constitutional: NAD, alert ENT: Has slightly tender, discrete, mobile lymph node upper left lateral neck, no associated soft tissue swelling and no skin redness Respiratory: Lung sounds CTA, room air, normal respiratory effort-pulse oximetry 99% Cardiovascular: S1-S2, pulse regular, normotensive, no peripheral edema Abdomen: no tenderness or distention, soft. Bowel sounds positive.  LBM 11/17-eating well Neurologic: CN 2-12 grossly intact. Sensation intact, Strength 5/5 x all 4 extremities.  Psychiatric: Normal judgment and insight. Alert and oriented x 3.  Pleasant affect   Assessment/Plan: Acute problems: MRSA bacteremia/MRSA TV endocarditis with moderate TVR/septic emboli/recent right shoulder and right calf abscess /status post I&D 10/6 (at [facility])  -TEE: tricuspid valve endocarditis.  CT surgery: not a candidate for angio VAC due to small size of vegetation -Narrowed to vancomycin 11/5-LD due 12/10  Pleuritic chest pain Resolved -11/5 echocardiogram: Small residual vegetation noted on the anterior TV leaflet. -CTA chest : Continued evolution of septic  pulmonary emboli now globally increased surrounding consolidated opacifications which likely explains recent pleuritic chest discomfort  RLQ hematoma -Onset 11/21  after Lovenox injection -Begin ice packs then transition over to heat after 24 to 48 hours  Headache and sinus congestion -Continue Zyrtec and ibuprofen prn  Neck discomfort -Continue K pad  -Add scheduled Robaxin and Voltaren gel  IVDU with narcotics -Continue Suboxone.  After bed offer secured and actual discharge date confirmed plan transition to Oxy IR 10 mg Q 6 hours as recommended by Pharmacist and discontinue Suboxone for duration of SNF stay.  -Recommend resume Suboxone once discharged from SNF. Previously received Suboxone at a clinic in [facility]  Depression and insomnia Continue preadmission Celexa, BuSpar and trazodone at bedtime  BuSpar dose increased to 7.5 mg TID on 11/17  Thrombocytopenia:  Resolved.   Recent right shoulder and calf abscess -Source of bacteremia -Status post I&D on 10/06 at [place] at outside hospital revealed no infectious involvement of AC joint    Severe protein calorie malnutrition Etiology: chronic illness and poor intake prior to admission secondary to IVDU Weight stable at 100.53 lbs  Mild intermittent asthma/current tobacco abuse -Currently asymptomatic -Continue prn bronchodilators and nicotine patch   Normocytic anemia with iron deficiency -Secondary to chronic malnutrition -Current hemoglobin 8.4 -Transfuse for hemoglobin </= 7.      +HCV AB -ID recommended treatment after discharge. -Also ID recommended HIV preexposure prophylaxis with either oral Truvada or long-acting aptitude injectable   Blood culture data: -Blood cultures positive for MRSA on 10/21, 10/22.   -Blood cultures 1/2 positive from 10/24. -Blood cultures 1/2 positive from 10/25. -Blood cultures 2 out of 2 positive from 10/27. -Blood cultures from 10/29 and finalized on 11/3   Data Reviewed: Basic Metabolic Panel: Recent Labs  Lab 07/29/21 0357 07/31/21 0511  CREATININE 0.64 0.72     Scheduled Meds:  buprenorphine-naloxone   1.5 tablet Sublingual Daily   busPIRone  7.5 mg Oral TID   cetirizine HCl  5 mg Oral Daily   Chlorhexidine Gluconate Cloth  6 each Topical Daily   citalopram  20 mg Oral Daily   enoxaparin (LOVENOX) injection  40 mg Subcutaneous Q24H   famotidine  20 mg Oral Daily   feeding supplement  237 mL Oral TID BM   multivitamin with minerals  1 tablet Oral Daily   nicotine  21 mg Transdermal Daily   sodium chloride flush  10-40 mL Intracatheter Q12H   traZODone  150 mg Oral QHS   Continuous Infusions:  vancomycin 1,000 mg (07/30/21 2015)    Principal Problem:   MRSA bacteremia Active Problems:   Severe opioid use disorder ([place])   Endocarditis   Septic pulmonary embolism (HCC)   Chronic viral hepatitis C ([place])   Shoulder abscess   Calf abscess   Odynophagia   Protein-calorie malnutrition, severe   Chest pain   Consultants: Infectious disease Cardiothoracic surgery  Procedures: TTE TEE  Antibiotics: Vancomycin 10/21 through 10/28 Valacyclovir 10/23 through 10/29 Daptomycin 10/28 >> 11/05 Ceftaroline 10/28 >> 11/05 Vancomycin 11/05 >>   Time spent: 15 minutes    Erin Hearing ANP  [facility] 7 am - 330 pm/M-F for direct patient care and secure chat Please refer to Amion for contact info 32  days

## 2021-07-31 NOTE — Progress Notes (Signed)
Pharmacy Antibiotic Note  Yvette Gaines is a 50 y.o. 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 Patient was previously not clearing her blood cultures and was put on combination therapy with daptomycin and ceftaroline for 7 days ending 11/3. Blood cultures from 10/29 with no growth (final). ECHO on 11/6 showed small residual vegetations on the anterior TV leaflet. Pharmacy was consulted for vancomycin dosing.  Renal function stable  Vancomycin levels collected 11/21 and 11/22 Vanc peak 37 mcg/ml Vanc trough 7  mcg/ml  Calculated AUC 449 (at goal), ke 0.078, t1/2 9 hrs    Plan: Continue vancomycin IV 1000 mg Q24H  Monitor renal function, weekly vancomycin levels (next levels 11/28) Plan to treat for 6 weeks weeks 10/29 - 12/10   Height: 5\' 2"  (157.5 cm) Weight: 50.5 kg (111 lb 6.4 oz) IBW/kg (Calculated) : 50.1 kg  Temp (24hrs), Avg:98.2 F (36.8 C), Min:98 F (36.7 C), Max:98.3 F (36.8 C)  Recent Labs  Lab 07/24/21 2303 07/29/21 0357 07/30/21 2213 07/31/21 0511 07/31/21 1917  CREATININE  --  0.64  --  0.72  --   VANCOTROUGH  --   --   --   --  7*  VANCOPEAK 38  --  36  --   --      Estimated Creatinine Clearance: 66.5 mL/min (by C-G formula based on SCr of 0.72 mg/dL).    No Known Allergies  Anti-inflectives this admission: Valacyclovir 10/23 >> 10/29 Vancomycin 10/21 >> 10/28, restarted 11/4 >>(12/10) Daptomycin 10/28 >> 11/3 Ceftaroline 10/28 >> 11/3  Vancomycin levels  11/8 VP 33; 11/9 VT 7, AUC 443 > continue  11/15 VP 38: 11/15 VT 8 (listed as pk in epic), AUC 480 >continue    Microbiology results: 10/21 BCx: MRSA 3/3 10/22 BCx >> MRSA 2/2 10/24 BCx >> 1/3 MRSA  10/25 Bcx >> 1/4 MRSA 10/27 Bcx >> MRSA 4/4 10/29 BCx >> NG/final 10/21 Hepatitis A, Hepatitis B, HIV serologies: negative 10/21 Hepatitis C antibody: reactive 10/27 HCV quant: >50 IU/ml  Thank you for allowing pharmacy to be a part of this patient's care.  Cephus Slater,  PharmD, [facility] Resident [phone] 07/31/2021 8:31 PM

## 2021-07-31 NOTE — Progress Notes (Signed)
This chaplain is present for F/U spiritual care. The Pt. accepted the chaplain's offer for pastoral presence and reflective listening at the bedside. The chaplain understands the Pt. is maintaining a disciplined routine of staying connected to scripture and prayer. The chaplain understands both of these practices are essential to the Pt. hope for quality of life at time of discharge.  The Pt. accepted the chaplain's invitation for prayer and continued spiritual care as she approaches discharge.  Chaplain Sallyanne Kuster [phone]

## 2021-08-01 DIAGNOSIS — R7881 Bacteremia: Secondary | ICD-10-CM | POA: Diagnosis not present

## 2021-08-01 DIAGNOSIS — B9562 Methicillin resistant Staphylococcus aureus infection as the cause of diseases classified elsewhere: Secondary | ICD-10-CM | POA: Diagnosis not present

## 2021-08-01 NOTE — Progress Notes (Signed)
PROGRESS NOTE    Yvette Gaines  EGB:[id number] DOB: 10-22-1970 DOA: 06/29/2021 PCP: [name], DO     Brief Narrative:  Yvette Gaines is a 50 year old F with PMH of intermittent asthma, IVDU with opiates and recent diagnosis of MRSA bacteremia with tricuspid valve endocarditis and bilateral pulmonary septic emboli presenting with left shoulder swelling and pain.   Patient presented to [facility] on 10/5 with left shoulder and left calf swelling and pain, and underwent I&D on 10/6 but left AMA.  Deep tissue culture with MRSA.  She returned to [facility] on 10/16 with new onset chest pain and shortness of breath and found to have bilateral septic emboli.  Blood culture with MRSA.  Echo revealed kidney vegetation.  Patient left AMA again and presented to [facility] to seek treatment on 10/2.   Initially started on IV vancomycin but changed to daptomycin and ceftaroline due to persistent bacteremia.  Eventually cleared and switched back to IV vancomycin on 10/29.  She will remain in-house on IV vancomycin through 08/18/2021.   New events last 24 hours / Subjective: No new complaints, seen walking around the unit.   Assessment & Plan:   Principal Problem:   MRSA bacteremia Active Problems:   Severe opioid use disorder ([place])   Endocarditis   Septic pulmonary embolism (HCC)   Chronic viral hepatitis C (HCC)   Shoulder abscess   Calf abscess   Odynophagia   Protein-calorie malnutrition, severe   Chest pain  MRSA bacteremia Tricuspid valve endocarditis-noted on TEE on 10/25. Bilateral pulmonary septic emboli Right shoulder and right calf abscess-s/p ID in [facility].   -Vancomycin>> daptomycin and ceftaroline 10/28>> vancomycin 11/4>>> 08/18/2021 -PICC line placed on 07/12/2021 -Patient to continue IV antibiotics in-house due to IVDU    IVDU with narcotics -Remains on Suboxone    Depression and insomnia -Continue home Celexa, BuSpar and trazodone at bedtime    Positive hepatitis C antibody -ID  recommended treatment after discharge.    DVT prophylaxis: Lovenox  Code Status: Full code  Family Communication: None at bedside  Disposition Plan:  Status is: Inpatient  Remains inpatient appropriate because: to remain in house through 12/10 for IV antibiotics     Antimicrobials:  Anti-infectives (From admission, onward)    Start     Dose/Rate Route Frequency Ordered Stop   07/14/21 2000  vancomycin (VANCOCIN) IVPB 1000 mg/200 mL premix        1,000 mg 200 mL/hr over 60 Minutes Intravenous Every 24 hours 07/12/21 1400     07/13/21 2000  vancomycin (VANCOREADY) IVPB 1250 mg/250 mL        1,250 mg 166.7 mL/hr over 90 Minutes Intravenous  Once 07/12/21 1400 07/13/21 2326   07/06/21 1400  ceftaroline (TEFLARO) 600 mg in sodium chloride 0.9 % 100 mL IVPB        600 mg 100 mL/hr over 60 Minutes Intravenous Every 8 hours 07/06/21 1033 07/12/21 2229   07/06/21 1330  DAPTOmycin (CUBICIN) 500 mg in sodium chloride 0.9 % IVPB        500 mg 120 mL/hr over 30 Minutes Intravenous Daily 07/06/21 1233 07/12/21 2022   07/01/21 2200  vancomycin (VANCOREADY) IVPB 750 mg/150 mL  Status:  Discontinued        750 mg 150 mL/hr over 60 Minutes Intravenous Every 12 hours 07/01/21 1314 07/06/21 1033   07/01/21 1400  valACYclovir (VALTREX) tablet 1,000 mg        1,000 mg Oral 2 times daily 07/01/21 1306 07/08/21  1027   06/30/21 0200  vancomycin (VANCOREADY) IVPB 750 mg/150 mL  Status:  Discontinued        750 mg 150 mL/hr over 60 Minutes Intravenous Every 12 hours 06/29/21 1253 06/29/21 1254   06/29/21 1700  vancomycin (VANCOREADY) IVPB 750 mg/150 mL  Status:  Discontinued        750 mg 150 mL/hr over 60 Minutes Intravenous Every 12 hours 06/29/21 1308 07/01/21 1301   06/29/21 1615  rifampin (RIFADIN) capsule 300 mg  Status:  Discontinued        300 mg Oral Daily 06/29/21 1606 06/29/21 1607   06/29/21 1130  vancomycin (VANCOREADY) IVPB 1250 mg/250 mL  Status:  Discontinued        1,250 mg 166.7  mL/hr over 90 Minutes Intravenous  Once 06/29/21 1124 06/29/21 1254   06/29/21 1130  ceFEPIme (MAXIPIME) 2 g in sodium chloride 0.9 % 100 mL IVPB  Status:  Discontinued        2 g 200 mL/hr over 30 Minutes Intravenous  Once 06/29/21 1124 06/29/21 1251        Objective: Vitals:   07/31/21 1537 07/31/21 2102 08/01/21 0510 08/01/21 0753  BP: 131/87 104/74 106/73 96/86  Pulse: 75 68 74 84  Resp: 18 18 17 16   Temp: 98.3 F (36.8 C) 98.1 F (36.7 C) 98.1 F (36.7 C) 98.1 F (36.7 C)  TempSrc: Oral Oral Oral Oral  SpO2: 100% 100%  100%  Weight:      Height:        Intake/Output Summary (Last 24 hours) at 08/01/2021 1009 Last data filed at 08/01/2021 0456 Gross per 24 hour  Intake 1300 ml  Output --  Net 1300 ml   Filed Weights   07/29/21 0500 07/31/21 0521 07/31/21 1500  Weight: 48.9 kg 47.5 kg 50.5 kg    Examination:  General exam: Appears calm and comfortable  Respiratory system: Clear to auscultation. Respiratory effort normal. No respiratory distress. No conversational dyspnea.  Cardiovascular system: S1 & S2 heard, RRR. No murmurs. No pedal edema. Gastrointestinal system: Abdomen is nondistended, soft and nontender. Normal bowel sounds heard. Central nervous system: Alert and oriented. No focal neurological deficits. Speech clear.  Extremities: Symmetric in appearance  Skin: No rashes, lesions or ulcers on exposed skin  Psychiatry: Judgement and insight appear normal. Mood & affect appropriate.   Data Reviewed: I have personally reviewed following labs and imaging studies  CBC: No results for input(s): WBC, NEUTROABS, HGB, HCT, MCV, PLT in the last 168 hours. Basic Metabolic Panel: Recent Labs  Lab 07/29/21 0357 07/31/21 0511  CREATININE 0.64 0.72   GFR: Estimated Creatinine Clearance: 66.5 mL/min (by C-G formula based on SCr of 0.72 mg/dL). Liver Function Tests: No results for input(s): AST, ALT, ALKPHOS, BILITOT, PROT, ALBUMIN in the last 168 hours. No  results for input(s): LIPASE, AMYLASE in the last 168 hours. No results for input(s): AMMONIA in the last 168 hours. Coagulation Profile: No results for input(s): INR, PROTIME in the last 168 hours. Cardiac Enzymes: No results for input(s): CKTOTAL, CKMB, CKMBINDEX, TROPONINI in the last 168 hours. BNP (last 3 results) No results for input(s): PROBNP in the last 8760 hours. HbA1C: No results for input(s): HGBA1C in the last 72 hours. CBG: No results for input(s): GLUCAP in the last 168 hours. Lipid Profile: No results for input(s): CHOL, HDL, LDLCALC, TRIG, CHOLHDL, LDLDIRECT in the last 72 hours. Thyroid Function Tests: No results for input(s): TSH, T4TOTAL, FREET4, T3FREE, THYROIDAB in the  last 72 hours. Anemia Panel: No results for input(s): VITAMINB12, FOLATE, FERRITIN, TIBC, IRON, RETICCTPCT in the last 72 hours. Sepsis Labs: No results for input(s): PROCALCITON, LATICACIDVEN in the last 168 hours.  No results found for this or any previous visit (from the past 240 hour(s)).    Radiology Studies: No results found.    Scheduled Meds:  buprenorphine-naloxone  1.5 tablet Sublingual Daily   busPIRone  7.5 mg Oral TID   cetirizine HCl  5 mg Oral Daily   Chlorhexidine Gluconate Cloth  6 each Topical Daily   citalopram  20 mg Oral Daily   diclofenac Sodium  4 g Topical QID   enoxaparin (LOVENOX) injection  40 mg Subcutaneous Q24H   famotidine  20 mg Oral Daily   feeding supplement  237 mL Oral TID BM   methocarbamol  500 mg Oral TID   multivitamin with minerals  1 tablet Oral Daily   nicotine  21 mg Transdermal Daily   sodium chloride flush  10-40 mL Intracatheter Q12H   traZODone  150 mg Oral QHS   Continuous Infusions:  vancomycin Stopped (07/31/21 2157)     LOS: 33 days      Time spent: 15 minutes   Dessa Phi, DO [facility] 08/01/2021, 10:09 AM   Available via Epic secure chat 7am-7pm After these hours, please refer to coverage provider listed  on [email or web address]

## 2021-08-01 NOTE — Plan of Care (Signed)

## 2021-08-02 DIAGNOSIS — B9562 Methicillin resistant Staphylococcus aureus infection as the cause of diseases classified elsewhere: Secondary | ICD-10-CM | POA: Diagnosis not present

## 2021-08-02 DIAGNOSIS — R7881 Bacteremia: Secondary | ICD-10-CM | POA: Diagnosis not present

## 2021-08-02 NOTE — Progress Notes (Signed)
PROGRESS NOTE    Yvette Gaines  PPI:[id number] DOB: 1971-05-01 DOA: 06/29/2021 PCP: [name], DO     Brief Narrative:  Yvette Gaines is a 50 year old F with PMH of intermittent asthma, IVDU with opiates and recent diagnosis of MRSA bacteremia with tricuspid valve endocarditis and bilateral pulmonary septic emboli presenting with left shoulder swelling and pain.   Patient presented to [facility] on 10/5 with left shoulder and left calf swelling and pain, and underwent I&D on 10/6 but left AMA.  Deep tissue culture with MRSA.  She returned to [facility] on 10/16 with new onset chest pain and shortness of breath and found to have bilateral septic emboli.  Blood culture with MRSA.  Echo revealed kidney vegetation.  Patient left AMA again and presented to [facility] to seek treatment on 10/2.   Initially started on IV vancomycin but changed to daptomycin and ceftaroline due to persistent bacteremia.  Eventually cleared and switched back to IV vancomycin on 10/29.  She will remain in-house on IV vancomycin through 08/18/2021.   New events last 24 hours / Subjective: No complaints today  Assessment & Plan:   Principal Problem:   MRSA bacteremia Active Problems:   Severe opioid use disorder (HCC)   Endocarditis   Septic pulmonary embolism (HCC)   Chronic viral hepatitis C (HCC)   Shoulder abscess   Calf abscess   Odynophagia   Protein-calorie malnutrition, severe   Chest pain  MRSA bacteremia Tricuspid valve endocarditis-noted on TEE on 10/25. Bilateral pulmonary septic emboli Right shoulder and right calf abscess-s/p ID in [facility].   -Vancomycin>> daptomycin and ceftaroline 10/28>> vancomycin 11/4>>> 08/18/2021 -PICC line placed on 07/12/2021 -Patient to continue IV antibiotics in-house due to IVDU    IVDU with narcotics -Remains on Suboxone    Depression and insomnia -Continue home Celexa, BuSpar and trazodone at bedtime    Positive hepatitis C antibody -ID recommended treatment after  discharge.    DVT prophylaxis: Lovenox  Code Status: Full code  Family Communication: None at bedside  Disposition Plan:  Status is: Inpatient  Remains inpatient appropriate because: to remain in house through 12/10 for IV antibiotics     Antimicrobials:  Anti-infectives (From admission, onward)    Start     Dose/Rate Route Frequency Ordered Stop   07/14/21 2000  vancomycin (VANCOCIN) IVPB 1000 mg/200 mL premix        1,000 mg 200 mL/hr over 60 Minutes Intravenous Every 24 hours 07/12/21 1400     07/13/21 2000  vancomycin (VANCOREADY) IVPB 1250 mg/250 mL        1,250 mg 166.7 mL/hr over 90 Minutes Intravenous  Once 07/12/21 1400 07/13/21 2326   07/06/21 1400  ceftaroline (TEFLARO) 600 mg in sodium chloride 0.9 % 100 mL IVPB        600 mg 100 mL/hr over 60 Minutes Intravenous Every 8 hours 07/06/21 1033 07/12/21 2229   07/06/21 1330  DAPTOmycin (CUBICIN) 500 mg in sodium chloride 0.9 % IVPB        500 mg 120 mL/hr over 30 Minutes Intravenous Daily 07/06/21 1233 07/12/21 2022   07/01/21 2200  vancomycin (VANCOREADY) IVPB 750 mg/150 mL  Status:  Discontinued        750 mg 150 mL/hr over 60 Minutes Intravenous Every 12 hours 07/01/21 1314 07/06/21 1033   07/01/21 1400  valACYclovir (VALTREX) tablet 1,000 mg        1,000 mg Oral 2 times daily 07/01/21 1306 07/08/21 0959   06/30/21 0200  vancomycin (VANCOREADY) IVPB 750 mg/150 mL  Status:  Discontinued        750 mg 150 mL/hr over 60 Minutes Intravenous Every 12 hours 06/29/21 1253 06/29/21 1254   06/29/21 1700  vancomycin (VANCOREADY) IVPB 750 mg/150 mL  Status:  Discontinued        750 mg 150 mL/hr over 60 Minutes Intravenous Every 12 hours 06/29/21 1308 07/01/21 1301   06/29/21 1615  rifampin (RIFADIN) capsule 300 mg  Status:  Discontinued        300 mg Oral Daily 06/29/21 1606 06/29/21 1607   06/29/21 1130  vancomycin (VANCOREADY) IVPB 1250 mg/250 mL  Status:  Discontinued        1,250 mg 166.7 mL/hr over 90 Minutes  Intravenous  Once 06/29/21 1124 06/29/21 1254   06/29/21 1130  ceFEPIme (MAXIPIME) 2 g in sodium chloride 0.9 % 100 mL IVPB  Status:  Discontinued        2 g 200 mL/hr over 30 Minutes Intravenous  Once 06/29/21 1124 06/29/21 1251        Objective: Vitals:   08/01/21 1648 08/01/21 2053 08/02/21 0456 08/02/21 0758  BP: 95/61 102/68 110/68 99/66  Pulse: 72 75 75 76  Resp: 16 18  16   Temp: 98.5 F (36.9 C) 98 F (36.7 C) 98.3 F (36.8 C) 98.4 F (36.9 C)  TempSrc: Oral Oral Oral Oral  SpO2: 100% 100% 100% 100%  Weight:      Height:        Intake/Output Summary (Last 24 hours) at 08/02/2021 1129 Last data filed at 08/02/2021 0732 Gross per 24 hour  Intake 680 ml  Output --  Net 680 ml    Filed Weights   07/29/21 0500 07/31/21 0521 07/31/21 1500  Weight: 48.9 kg 47.5 kg 50.5 kg    Examination:  General exam: Appears calm and comfortable   Data Reviewed: I have personally reviewed following labs and imaging studies  CBC: No results for input(s): WBC, NEUTROABS, HGB, HCT, MCV, PLT in the last 168 hours. Basic Metabolic Panel: Recent Labs  Lab 07/29/21 0357 07/31/21 0511  CREATININE 0.64 0.72    GFR: Estimated Creatinine Clearance: 66.5 mL/min (by C-G formula based on SCr of 0.72 mg/dL). Liver Function Tests: No results for input(s): AST, ALT, ALKPHOS, BILITOT, PROT, ALBUMIN in the last 168 hours. No results for input(s): LIPASE, AMYLASE in the last 168 hours. No results for input(s): AMMONIA in the last 168 hours. Coagulation Profile: No results for input(s): INR, PROTIME in the last 168 hours. Cardiac Enzymes: No results for input(s): CKTOTAL, CKMB, CKMBINDEX, TROPONINI in the last 168 hours. BNP (last 3 results) No results for input(s): PROBNP in the last 8760 hours. HbA1C: No results for input(s): HGBA1C in the last 72 hours. CBG: No results for input(s): GLUCAP in the last 168 hours. Lipid Profile: No results for input(s): CHOL, HDL, LDLCALC, TRIG,  CHOLHDL, LDLDIRECT in the last 72 hours. Thyroid Function Tests: No results for input(s): TSH, T4TOTAL, FREET4, T3FREE, THYROIDAB in the last 72 hours. Anemia Panel: No results for input(s): VITAMINB12, FOLATE, FERRITIN, TIBC, IRON, RETICCTPCT in the last 72 hours. Sepsis Labs: No results for input(s): PROCALCITON, LATICACIDVEN in the last 168 hours.  No results found for this or any previous visit (from the past 240 hour(s)).    Radiology Studies: No results found.    Scheduled Meds:  buprenorphine-naloxone  1.5 tablet Sublingual Daily   busPIRone  7.5 mg Oral TID   cetirizine HCl  5  mg Oral Daily   Chlorhexidine Gluconate Cloth  6 each Topical Daily   citalopram  20 mg Oral Daily   diclofenac Sodium  4 g Topical QID   enoxaparin (LOVENOX) injection  40 mg Subcutaneous Q24H   famotidine  20 mg Oral Daily   feeding supplement  237 mL Oral TID BM   methocarbamol  500 mg Oral TID   multivitamin with minerals  1 tablet Oral Daily   nicotine  21 mg Transdermal Daily   sodium chloride flush  10-40 mL Intracatheter Q12H   traZODone  150 mg Oral QHS   Continuous Infusions:  vancomycin Stopped (08/01/21 2202)     LOS: 34 days      Time spent: 15 minutes   Dessa Phi, DO [facility] 08/02/2021, 11:29 AM   Available via Epic secure chat 7am-7pm After these hours, please refer to coverage provider listed on [email or web address]

## 2021-08-03 DIAGNOSIS — R7881 Bacteremia: Secondary | ICD-10-CM | POA: Diagnosis not present

## 2021-08-03 DIAGNOSIS — B9562 Methicillin resistant Staphylococcus aureus infection as the cause of diseases classified elsewhere: Secondary | ICD-10-CM | POA: Diagnosis not present

## 2021-08-03 NOTE — Progress Notes (Signed)
PROGRESS NOTE    Yvette Gaines  NLZ:[id number] DOB: 1970/12/08 DOA: 06/29/2021 PCP: [name], DO     Brief Narrative:  Yvette Gaines is a 50 year old F with PMH of intermittent asthma, IVDU with opiates and recent diagnosis of MRSA bacteremia with tricuspid valve endocarditis and bilateral pulmonary septic emboli presenting with left shoulder swelling and pain.   Patient presented to [facility] on 10/5 with left shoulder and left calf swelling and pain, and underwent I&D on 10/6 but left AMA.  Deep tissue culture with MRSA.  She returned to [facility] on 10/16 with new onset chest pain and shortness of breath and found to have bilateral septic emboli.  Blood culture with MRSA.  Echo revealed kidney vegetation.  Patient left AMA again and presented to [facility] to seek treatment on 10/2.   Initially started on IV vancomycin but changed to daptomycin and ceftaroline due to persistent bacteremia.  Eventually cleared and switched back to IV vancomycin on 10/29.  She will remain in-house on IV vancomycin through 08/18/2021.   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 Bilateral pulmonary septic emboli Right shoulder and right calf abscess-s/p ID in [facility].   -Vancomycin>> daptomycin and ceftaroline 10/28>> vancomycin 11/4>>> 08/18/2021 -PICC line placed on 07/12/2021 -Patient to continue IV antibiotics in-house due to IVDU    IVDU with narcotics -Remains on Suboxone    Depression and insomnia -Continue home Celexa, BuSpar and trazodone at bedtime    Positive hepatitis C antibody -ID recommended  treatment after discharge.    DVT prophylaxis: Lovenox  Code Status: Full code  Family Communication: None at bedside  Disposition Plan:  Status is: Inpatient  Remains inpatient appropriate because: to remain in house through 12/10 for IV antibiotics     Antimicrobials:  Anti-infectives (From admission, onward)    Start     Dose/Rate Route Frequency Ordered Stop   07/14/21 2000  vancomycin (VANCOCIN) IVPB 1000 mg/200 mL premix        1,000 mg 200 mL/hr over 60 Minutes Intravenous Every 24 hours 07/12/21 1400     07/13/21 2000  vancomycin (VANCOREADY) IVPB 1250 mg/250 mL        1,250 mg 166.7 mL/hr over 90 Minutes Intravenous  Once 07/12/21 1400 07/13/21 2326   07/06/21 1400  ceftaroline (TEFLARO) 600 mg in sodium chloride 0.9 % 100 mL IVPB        600 mg 100 mL/hr over 60 Minutes Intravenous Every 8 hours 07/06/21 1033 07/12/21 2229   07/06/21 1330  DAPTOmycin (CUBICIN) 500 mg in sodium chloride 0.9 % IVPB        500 mg 120 mL/hr over 30 Minutes Intravenous Daily 07/06/21 1233 07/12/21 2022   07/01/21 2200  vancomycin (VANCOREADY) IVPB 750 mg/150 mL  Status:  Discontinued        750 mg 150 mL/hr over 60 Minutes Intravenous Every 12 hours 07/01/21 1314 07/06/21 1033   07/01/21 1400  valACYclovir (VALTREX) tablet 1,000 mg        1,000 mg Oral 2 times daily 07/01/21 1306 07/08/21 0959  06/30/21 0200  vancomycin (VANCOREADY) IVPB 750 mg/150 mL  Status:  Discontinued        750 mg 150 mL/hr over 60 Minutes Intravenous Every 12 hours 06/29/21 1253 06/29/21 1254   06/29/21 1700  vancomycin (VANCOREADY) IVPB 750 mg/150 mL  Status:  Discontinued        750 mg 150 mL/hr over 60 Minutes Intravenous Every 12 hours 06/29/21 1308 07/01/21 1301   06/29/21 1615  rifampin (RIFADIN) capsule 300 mg  Status:  Discontinued        300 mg Oral Daily 06/29/21 1606 06/29/21 1607   06/29/21 1130  vancomycin (VANCOREADY) IVPB 1250 mg/250 mL  Status:  Discontinued        1,250 mg 166.7 mL/hr over 90  Minutes Intravenous  Once 06/29/21 1124 06/29/21 1254   06/29/21 1130  ceFEPIme (MAXIPIME) 2 g in sodium chloride 0.9 % 100 mL IVPB  Status:  Discontinued        2 g 200 mL/hr over 30 Minutes Intravenous  Once 06/29/21 1124 06/29/21 1251        Objective: Vitals:   08/02/21 1628 08/02/21 2055 08/03/21 0400 08/03/21 0845  BP: 102/67 107/72 92/64 105/61  Pulse: 78 76 74 77  Resp: 16 16 15 17   Temp: 98.1 F (36.7 C) 98.5 F (36.9 C) 98.6 F (37 C) 98.6 F (37 C)  TempSrc: Oral Oral Oral Oral  SpO2: 94% 100% 100% 100%  Weight:      Height:        Intake/Output Summary (Last 24 hours) at 08/03/2021 1111 Last data filed at 08/03/2021 0800 Gross per 24 hour  Intake 360 ml  Output --  Net 360 ml    Filed Weights   07/29/21 0500 07/31/21 0521 07/31/21 1500  Weight: 48.9 kg 47.5 kg 50.5 kg    Examination:  General exam: Appears calm and comfortable   Data Reviewed: I have personally reviewed following labs and imaging studies  CBC: No results for input(s): WBC, NEUTROABS, HGB, HCT, MCV, PLT in the last 168 hours. Basic Metabolic Panel: Recent Labs  Lab 07/29/21 0357 07/31/21 0511  CREATININE 0.64 0.72    GFR: Estimated Creatinine Clearance: 66.5 mL/min (by C-G formula based on SCr of 0.72 mg/dL). Liver Function Tests: No results for input(s): AST, ALT, ALKPHOS, BILITOT, PROT, ALBUMIN in the last 168 hours. No results for input(s): LIPASE, AMYLASE in the last 168 hours. No results for input(s): AMMONIA in the last 168 hours. Coagulation Profile: No results for input(s): INR, PROTIME in the last 168 hours. Cardiac Enzymes: No results for input(s): CKTOTAL, CKMB, CKMBINDEX, TROPONINI in the last 168 hours. BNP (last 3 results) No results for input(s): PROBNP in the last 8760 hours. HbA1C: No results for input(s): HGBA1C in the last 72 hours. CBG: No results for input(s): GLUCAP in the last 168 hours. Lipid Profile: No results for input(s): CHOL, HDL, LDLCALC,  TRIG, CHOLHDL, LDLDIRECT in the last 72 hours. Thyroid Function Tests: No results for input(s): TSH, T4TOTAL, FREET4, T3FREE, THYROIDAB in the last 72 hours. Anemia Panel: No results for input(s): VITAMINB12, FOLATE, FERRITIN, TIBC, IRON, RETICCTPCT in the last 72 hours. Sepsis Labs: No results for input(s): PROCALCITON, LATICACIDVEN in the last 168 hours.  No results found for this or any previous visit (from the past 240 hour(s)).    Radiology Studies: No results found.    Scheduled Meds:  buprenorphine-naloxone  1.5 tablet Sublingual Daily   busPIRone  7.5 mg Oral TID   cetirizine  HCl  5 mg Oral Daily   Chlorhexidine Gluconate Cloth  6 each Topical Daily   citalopram  20 mg Oral Daily   diclofenac Sodium  4 g Topical QID   enoxaparin (LOVENOX) injection  40 mg Subcutaneous Q24H   famotidine  20 mg Oral Daily   feeding supplement  237 mL Oral TID BM   methocarbamol  500 mg Oral TID   multivitamin with minerals  1 tablet Oral Daily   nicotine  21 mg Transdermal Daily   sodium chloride flush  10-40 mL Intracatheter Q12H   traZODone  150 mg Oral QHS   Continuous Infusions:  vancomycin 1,000 mg (08/02/21 2046)     LOS: 35 days      Time spent: 15 minutes   Dessa Phi, DO [facility] 08/03/2021, 11:11 AM   Available via Epic secure chat 7am-7pm After these hours, please refer to coverage provider listed on [email or web address]

## 2021-08-04 DIAGNOSIS — R7881 Bacteremia: Secondary | ICD-10-CM | POA: Diagnosis not present

## 2021-08-04 DIAGNOSIS — B9562 Methicillin resistant Staphylococcus aureus infection as the cause of diseases classified elsewhere: Secondary | ICD-10-CM | POA: Diagnosis not present

## 2021-08-04 NOTE — Progress Notes (Signed)
PROGRESS NOTE    Yvette Gaines  FYB:[id number] DOB: 1971/08/07 DOA: 06/29/2021 PCP: [name], DO     Brief Narrative:  Yvette Gaines is a 50 year old F with PMH of intermittent asthma, IVDU with opiates and recent diagnosis of MRSA bacteremia with tricuspid valve endocarditis and bilateral pulmonary septic emboli presenting with left shoulder swelling and pain.   Patient presented to [facility] on 10/5 with left shoulder and left calf swelling and pain, and underwent I&D on 10/6 but left AMA.  Deep tissue culture with MRSA.  She returned to [facility] on 10/16 with new onset chest pain and shortness of breath and found to have bilateral septic emboli.  Blood culture with MRSA.  Echo revealed kidney vegetation.  Patient left AMA again and presented to [facility] to seek treatment on 10/2.   Initially started on IV vancomycin but changed to daptomycin and ceftaroline due to persistent bacteremia.  Eventually cleared and switched back to IV vancomycin on 10/29.  She will remain in-house on IV vancomycin through 08/18/2021.   New events last 24 hours / Subjective: No new issues reported, feeling well   Assessment & Plan:   Principal Problem:   MRSA bacteremia Active Problems:   Severe opioid use disorder (HCC)   Endocarditis   Septic pulmonary embolism (HCC)   Chronic viral hepatitis C (HCC)   Shoulder abscess   Calf abscess   Odynophagia   Protein-calorie malnutrition, severe   Chest pain  MRSA bacteremia Tricuspid valve endocarditis-noted on TEE on 10/25. Bilateral pulmonary septic emboli Right shoulder and right calf abscess-s/p ID in [facility].   -Vancomycin>> daptomycin and ceftaroline 10/28>> vancomycin 11/4>>> 08/18/2021 -PICC line placed on 07/12/2021 -Patient to continue IV antibiotics in-house due to IVDU    IVDU with narcotics -Remains on Suboxone    Depression and insomnia -Continue home Celexa, BuSpar and trazodone at bedtime    Positive hepatitis C antibody -ID recommended  treatment after discharge.    DVT prophylaxis: Lovenox  Code Status: Full code  Family Communication: None at bedside  Disposition Plan:  Status is: Inpatient  Remains inpatient appropriate because: to remain in house through 12/10 for IV antibiotics     Antimicrobials:  Anti-infectives (From admission, onward)    Start     Dose/Rate Route Frequency Ordered Stop   07/14/21 2000  vancomycin (VANCOCIN) IVPB 1000 mg/200 mL premix        1,000 mg 200 mL/hr over 60 Minutes Intravenous Every 24 hours 07/12/21 1400     07/13/21 2000  vancomycin (VANCOREADY) IVPB 1250 mg/250 mL        1,250 mg 166.7 mL/hr over 90 Minutes Intravenous  Once 07/12/21 1400 07/13/21 2326   07/06/21 1400  ceftaroline (TEFLARO) 600 mg in sodium chloride 0.9 % 100 mL IVPB        600 mg 100 mL/hr over 60 Minutes Intravenous Every 8 hours 07/06/21 1033 07/12/21 2229   07/06/21 1330  DAPTOmycin (CUBICIN) 500 mg in sodium chloride 0.9 % IVPB        500 mg 120 mL/hr over 30 Minutes Intravenous Daily 07/06/21 1233 07/12/21 2022   07/01/21 2200  vancomycin (VANCOREADY) IVPB 750 mg/150 mL  Status:  Discontinued        750 mg 150 mL/hr over 60 Minutes Intravenous Every 12 hours 07/01/21 1314 07/06/21 1033   07/01/21 1400  valACYclovir (VALTREX) tablet 1,000 mg        1,000 mg Oral 2 times daily 07/01/21 1306 07/08/21 0959  06/30/21 0200  vancomycin (VANCOREADY) IVPB 750 mg/150 mL  Status:  Discontinued        750 mg 150 mL/hr over 60 Minutes Intravenous Every 12 hours 06/29/21 1253 06/29/21 1254   06/29/21 1700  vancomycin (VANCOREADY) IVPB 750 mg/150 mL  Status:  Discontinued        750 mg 150 mL/hr over 60 Minutes Intravenous Every 12 hours 06/29/21 1308 07/01/21 1301   06/29/21 1615  rifampin (RIFADIN) capsule 300 mg  Status:  Discontinued        300 mg Oral Daily 06/29/21 1606 06/29/21 1607   06/29/21 1130  vancomycin (VANCOREADY) IVPB 1250 mg/250 mL  Status:  Discontinued        1,250 mg 166.7 mL/hr over 90  Minutes Intravenous  Once 06/29/21 1124 06/29/21 1254   06/29/21 1130  ceFEPIme (MAXIPIME) 2 g in sodium chloride 0.9 % 100 mL IVPB  Status:  Discontinued        2 g 200 mL/hr over 30 Minutes Intravenous  Once 06/29/21 1124 06/29/21 1251        Objective: Vitals:   08/03/21 2037 08/04/21 0423 08/04/21 0430 08/04/21 0802  BP: 113/74 (!) 89/55 92/61 103/61  Pulse: 68 65 67 70  Resp: 16 16  19   Temp: 98.2 F (36.8 C) 98.2 F (36.8 C)  98.3 F (36.8 C)  TempSrc: Oral Oral  Oral  SpO2: 100% 99%  100%  Weight:      Height:        Intake/Output Summary (Last 24 hours) at 08/04/2021 1120 Last data filed at 08/04/2021 0817 Gross per 24 hour  Intake 1300 ml  Output --  Net 1300 ml    Filed Weights   07/29/21 0500 07/31/21 0521 07/31/21 1500  Weight: 48.9 kg 47.5 kg 50.5 kg    Examination:  General exam: Appears calm and comfortable   Data Reviewed: I have personally reviewed following labs and imaging studies  CBC: No results for input(s): WBC, NEUTROABS, HGB, HCT, MCV, PLT in the last 168 hours. Basic Metabolic Panel: Recent Labs  Lab 07/29/21 0357 07/31/21 0511  CREATININE 0.64 0.72    GFR: Estimated Creatinine Clearance: 66.5 mL/min (by C-G formula based on SCr of 0.72 mg/dL). Liver Function Tests: No results for input(s): AST, ALT, ALKPHOS, BILITOT, PROT, ALBUMIN in the last 168 hours. No results for input(s): LIPASE, AMYLASE in the last 168 hours. No results for input(s): AMMONIA in the last 168 hours. Coagulation Profile: No results for input(s): INR, PROTIME in the last 168 hours. Cardiac Enzymes: No results for input(s): CKTOTAL, CKMB, CKMBINDEX, TROPONINI in the last 168 hours. BNP (last 3 results) No results for input(s): PROBNP in the last 8760 hours. HbA1C: No results for input(s): HGBA1C in the last 72 hours. CBG: No results for input(s): GLUCAP in the last 168 hours. Lipid Profile: No results for input(s): CHOL, HDL, LDLCALC, TRIG, CHOLHDL,  LDLDIRECT in the last 72 hours. Thyroid Function Tests: No results for input(s): TSH, T4TOTAL, FREET4, T3FREE, THYROIDAB in the last 72 hours. Anemia Panel: No results for input(s): VITAMINB12, FOLATE, FERRITIN, TIBC, IRON, RETICCTPCT in the last 72 hours. Sepsis Labs: No results for input(s): PROCALCITON, LATICACIDVEN in the last 168 hours.  No results found for this or any previous visit (from the past 240 hour(s)).    Radiology Studies: No results found.    Scheduled Meds:  buprenorphine-naloxone  1.5 tablet Sublingual Daily   busPIRone  7.5 mg Oral TID   cetirizine HCl  5 mg Oral Daily   Chlorhexidine Gluconate Cloth  6 each Topical Daily   citalopram  20 mg Oral Daily   diclofenac Sodium  4 g Topical QID   enoxaparin (LOVENOX) injection  40 mg Subcutaneous Q24H   famotidine  20 mg Oral Daily   feeding supplement  237 mL Oral TID BM   methocarbamol  500 mg Oral TID   multivitamin with minerals  1 tablet Oral Daily   nicotine  21 mg Transdermal Daily   sodium chloride flush  10-40 mL Intracatheter Q12H   traZODone  150 mg Oral QHS   Continuous Infusions:  vancomycin Stopped (08/03/21 2236)     LOS: 36 days      Time spent: 15 minutes   Dessa Phi, DO [facility] 08/04/2021, 11:20 AM   Available via Epic secure chat 7am-7pm After these hours, please refer to coverage provider listed on [email or web address]

## 2021-08-05 DIAGNOSIS — R7881 Bacteremia: Secondary | ICD-10-CM | POA: Diagnosis not present

## 2021-08-05 DIAGNOSIS — B9562 Methicillin resistant Staphylococcus aureus infection as the cause of diseases classified elsewhere: Secondary | ICD-10-CM | POA: Diagnosis not present

## 2021-08-05 NOTE — Progress Notes (Signed)
Subjective: Lying comfortably in bed-no chest pain or shortness of breath.  Objective: Blood pressure 99/67, pulse 67, temperature 98.5 F (36.9 C), temperature source Oral, resp. rate 18, height 5\' 2"  (1.575 m), weight 50.5 kg, SpO2 99 %.   Assessment/plan: MRSA bacteremia with tricuspid valve endocarditis/bilateral pulmonary septic emboli/right shoulder/right calf abscess: Continue IV vancomycin until 12/10.  Plan is to continue in-house IV antibiotics due to history of IVDA.  IVDU with narcotics:Remains on Suboxone    Depression and insomnia:Continue home Celexa, BuSpar and trazodone at bedtime    Positive hepatitis C antibody:ID recommended treatment after discharge.  Total time spent: 15 minutes

## 2021-08-06 ENCOUNTER — Inpatient Hospital Stay (HOSPITAL_COMMUNITY): Payer: 59

## 2021-08-06 DIAGNOSIS — R7881 Bacteremia: Secondary | ICD-10-CM | POA: Diagnosis not present

## 2021-08-06 DIAGNOSIS — B182 Chronic viral hepatitis C: Secondary | ICD-10-CM | POA: Diagnosis not present

## 2021-08-06 DIAGNOSIS — I330 Acute and subacute infective endocarditis: Secondary | ICD-10-CM | POA: Diagnosis not present

## 2021-08-06 DIAGNOSIS — S0633AA Contusion and laceration of cerebrum, unspecified, with loss of consciousness status unknown, initial encounter: Secondary | ICD-10-CM | POA: Diagnosis not present

## 2021-08-06 DIAGNOSIS — F1990 Other psychoactive substance use, unspecified, uncomplicated: Secondary | ICD-10-CM

## 2021-08-06 DIAGNOSIS — B9562 Methicillin resistant Staphylococcus aureus infection as the cause of diseases classified elsewhere: Secondary | ICD-10-CM | POA: Diagnosis not present

## 2021-08-06 DIAGNOSIS — L02419 Cutaneous abscess of limb, unspecified: Secondary | ICD-10-CM | POA: Diagnosis not present

## 2021-08-06 LAB — COMPREHENSIVE METABOLIC PANEL
ALT: 15 U/L (ref 0–44)
AST: 17 U/L (ref 15–41)
Albumin: 3.1 g/dL — ABNORMAL LOW (ref 3.5–5.0)
Alkaline Phosphatase: 76 U/L (ref 38–126)
Anion gap: 7 (ref 5–15)
BUN: 22 mg/dL — ABNORMAL HIGH (ref 6–20)
CO2: 29 mmol/L (ref 22–32)
Calcium: 8.9 mg/dL (ref 8.9–10.3)
Chloride: 102 mmol/L (ref 98–111)
Creatinine, Ser: 0.72 mg/dL (ref 0.44–1.00)
GFR, Estimated: >60 mL/min (ref >60)
Glucose, Bld: 105 mg/dL — ABNORMAL HIGH (ref 70–99)
Potassium: 4.3 mmol/L (ref 3.5–5.1)
Sodium: 138 mmol/L (ref 135–145)
Total Bilirubin: 0.6 mg/dL (ref 0.3–1.2)
Total Protein: 6.3 g/dL — ABNORMAL LOW (ref 6.5–8.1)

## 2021-08-06 LAB — VANCOMYCIN, PEAK: Vancomycin Pk: 32 ug/mL (ref 30–40)

## 2021-08-06 LAB — CBC
HCT: 32.9 % — ABNORMAL LOW (ref 36.0–46.0)
Hemoglobin: 10.6 g/dL — ABNORMAL LOW (ref 12.0–15.0)
MCH: 29 pg (ref 26.0–34.0)
MCHC: 32.2 g/dL (ref 30.0–36.0)
MCV: 89.9 fL (ref 80.0–100.0)
Platelets: 287 10*3/uL (ref 150–400)
RBC: 3.66 MIL/uL — ABNORMAL LOW (ref 3.87–5.11)
RDW: 17.2 % — ABNORMAL HIGH (ref 11.5–15.5)
WBC: 5.2 10*3/uL (ref 4.0–10.5)
nRBC: 0 % (ref 0.0–0.2)

## 2021-08-06 LAB — VITAMIN B12: Vitamin B-12: 491 pg/mL (ref 180–914)

## 2021-08-06 IMAGING — MR MR HEAD WO/W CM
9 of 13 series · 34 of 48 positions shown · IV contrast (gadavist)
Comparison: None.

CLINICAL DATA: Headache, history of IV drug use

EXAM:
MRI HEAD WITHOUT AND WITH CONTRAST
TECHNIQUE: Multiplanar, multiecho pulse sequences of the brain and surrounding
structures were obtained without and with intravenous contrast.
CONTRAST:  5mL GADAVIST GADOBUTROL 1 MMOL/ML IV SOLN

[Series 10: DWI · axial · 3.0mm · 1.09mm/px · z∈[+62,+218]mm · 9 of 106 slices shown (1 of 4)]
[im 1/106]
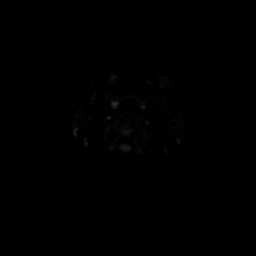
[im 14/106]
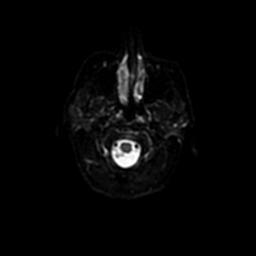
[im 27/106]
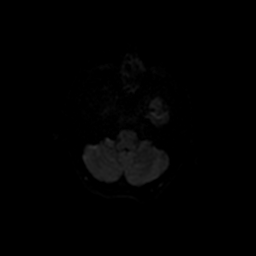
[im 40/106]
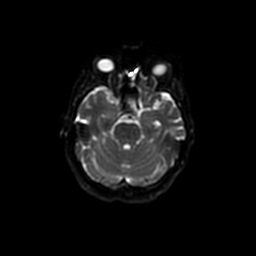
[im 53/106]
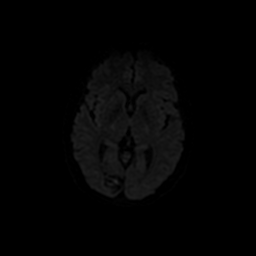
[im 66/106]
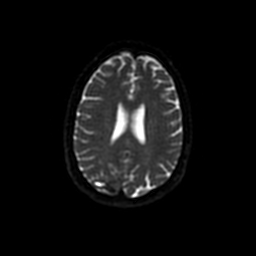
[im 79/106]
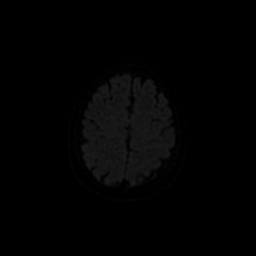
[im 92/106]
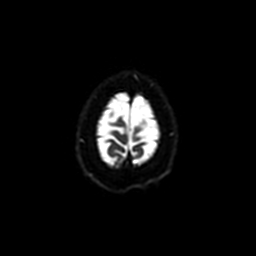
[im 106/106]
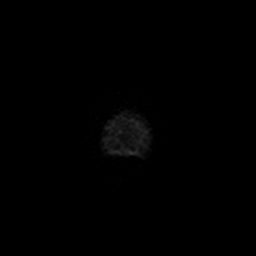

[Series 11: DWI · coronal · 5.0mm · 1.09mm/px · 6 of 72 slices shown (2 of 4)]
[im 1/72]
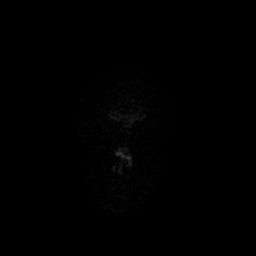
[im 15/72]
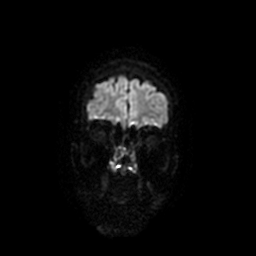
[im 29/72]
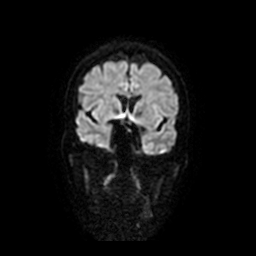
[im 43/72]
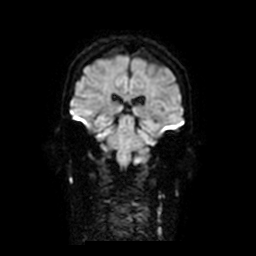
[im 57/72]
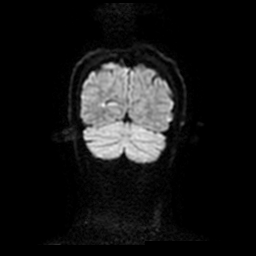
[im 72/72]
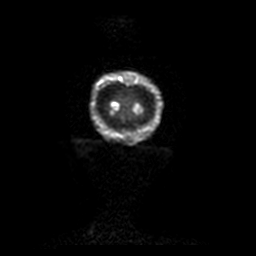

[Series 13: T2 · axial · 5.0mm · 0.43mm/px · z∈[+68,+205]mm · 2 of 24 slices shown]
[im 1/24]
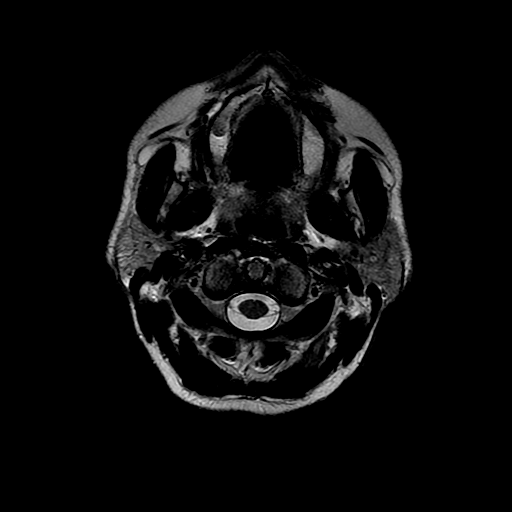
[im 24/24]
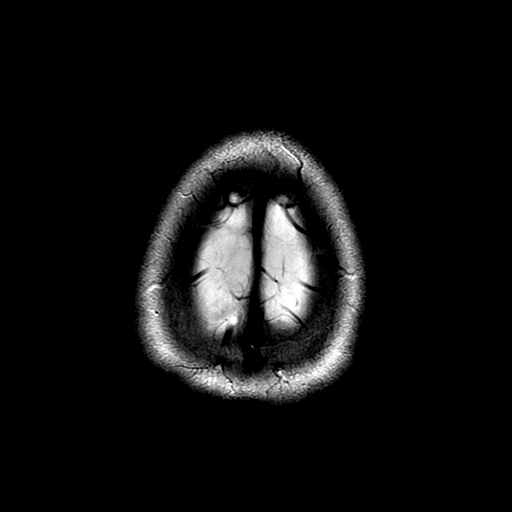

[Series 14: FLAIR · axial · 3.0mm · 0.43mm/px · z∈[+68,+205]mm · 2 of 24 slices shown]
[im 1/24]
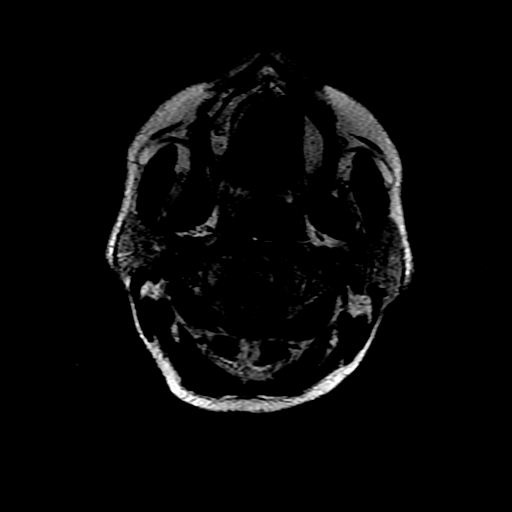
[im 24/24]
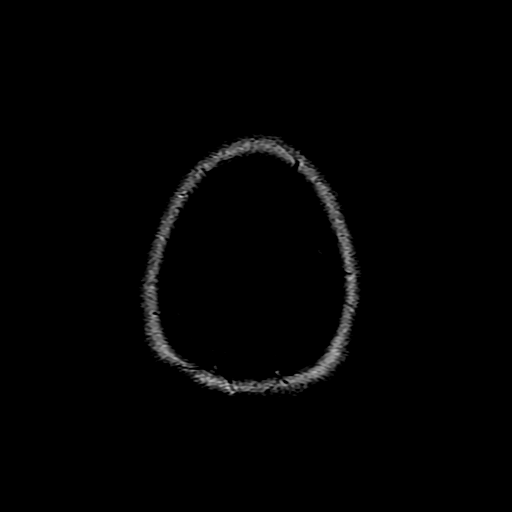

[Series 18: T1 post-contrast · axial · 3.0mm · 0.43mm/px · z∈[+63,+210]mm · 4 of 50 slices shown (1 of 3)]
[im 1/50]
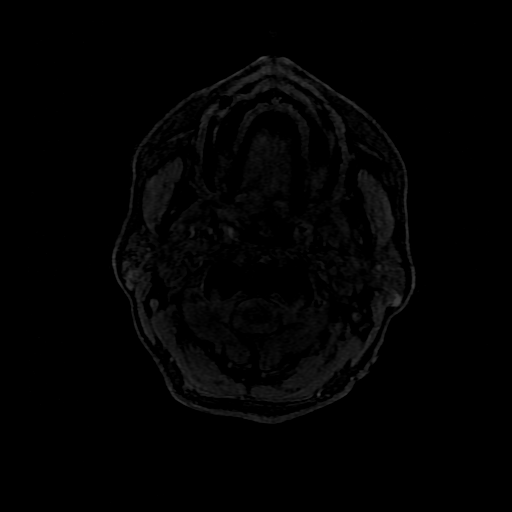
[im 17/50]
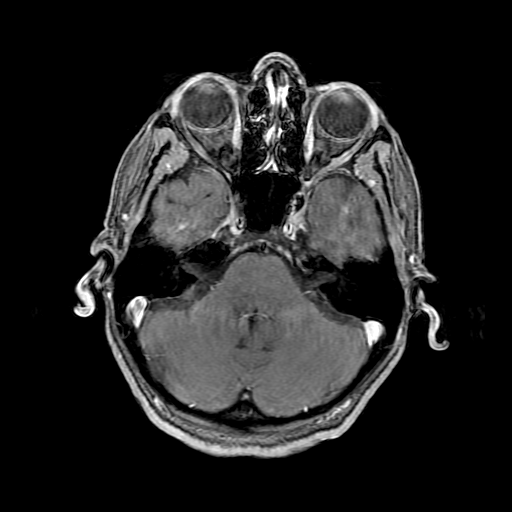
[im 33/50]
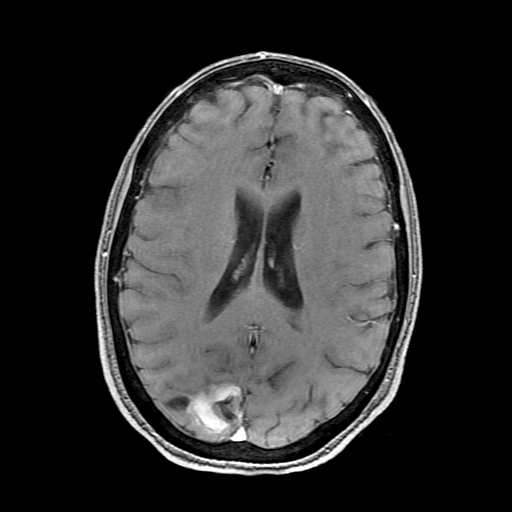
[im 50/50]
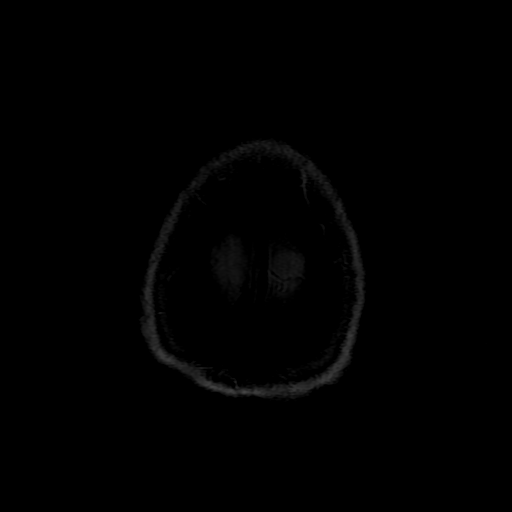

[Series 19: T1 post-contrast · coronal · 5.0mm · 0.39mm/px · 2 of 24 slices shown (2 of 3)]
[im 1/24]
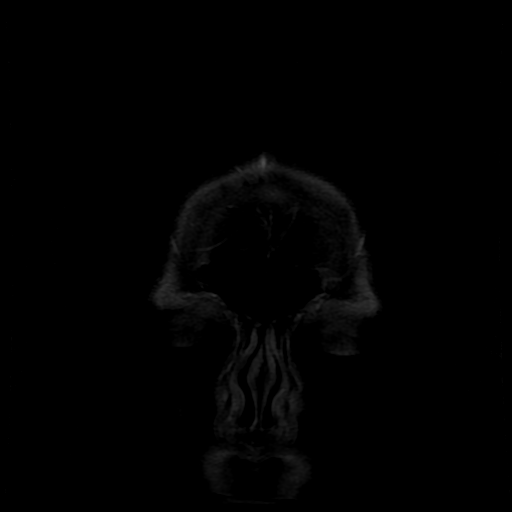
[im 24/24]
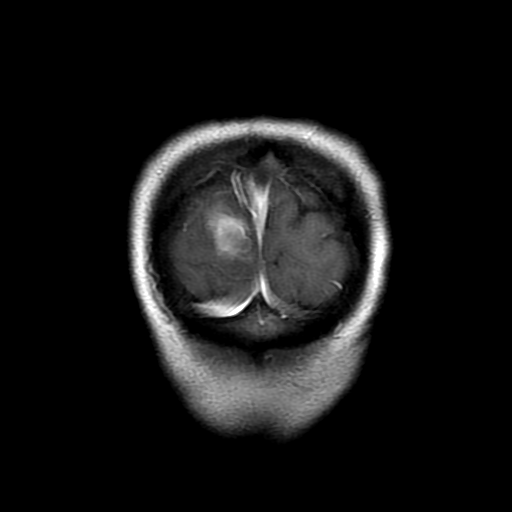

[Series 20: T1 post-contrast · sagittal · 5.0mm · 0.47mm/px · 2 of 21 slices shown (3 of 3)]
[im 1/21]
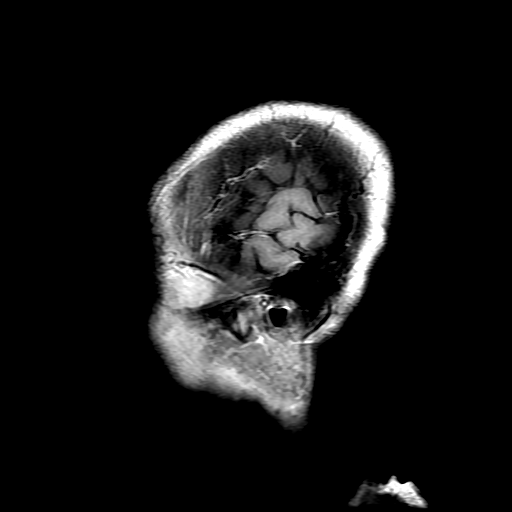
[im 21/21]
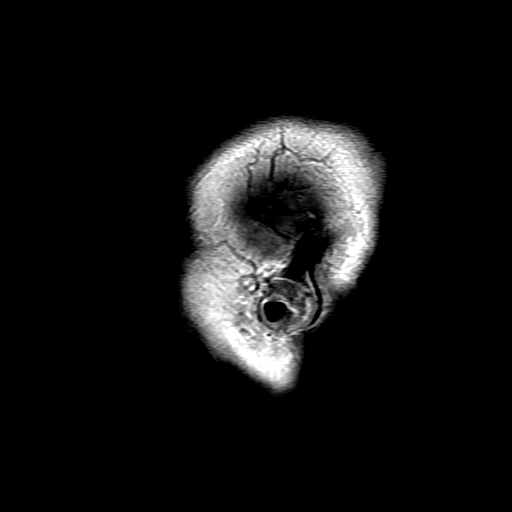

[Series 1000: DWI · axial · 3.0mm · 1.09mm/px · z∈[+62,+218]mm · 4 of 53 slices shown (3 of 4)]
[im 1/53]
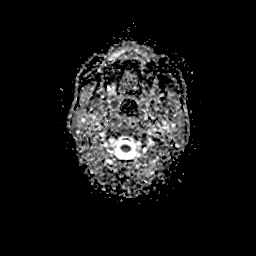
[im 18/53]
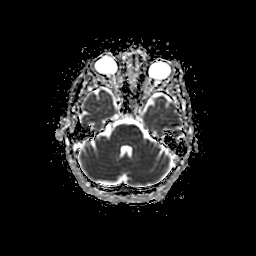
[im 35/53]
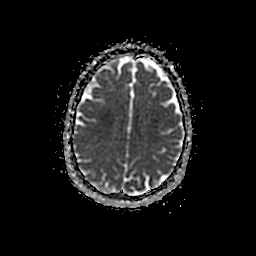
[im 53/53]
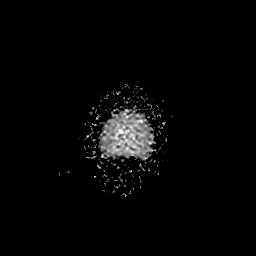

[Series 1100: DWI · coronal · 5.0mm · 1.09mm/px · 3 of 36 slices shown (4 of 4)]
[im 1/36]
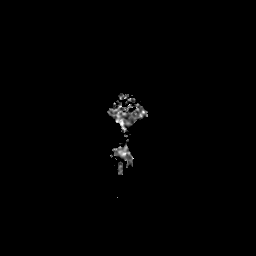
[im 18/36]
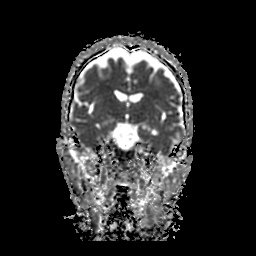
[im 36/36]
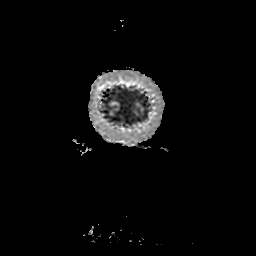

[34 of 48 positions shown; findings below may reference images not displayed]

FINDINGS: Brain: There is a 2.4 cm AP x 2.1 cm TV x 1.8 cm cc T1 and T2
hyperintense collection in the right occipital lobe. There is
peripheral T2* hypointensity and patchy diffusion restriction. There
is no appreciable enhancement either centrally or peripherally.
There is mild surrounding FLAIR signal abnormality without
significant regional midline shift.

There is no other parenchymal signal abnormality. Parenchymal volume
is normal. The ventricles are normal in size.

There is no abnormal enhancement.  There is no midline shift.

Vascular: Normal flow voids.

Skull and upper cervical spine: Normal marrow signal.

Sinuses/Orbits: The paranasal sinuses are clear. The globes and
orbits are unremarkable.

Other: None.
IMPRESSION: 1. 2.4 cm T1 hyperintense collection in the right occipital lobe is
most consistent with an intraparenchymal hematoma, subacute in
chronicity by signal characteristics. There is mild perilesional
edema without significant midline shift.
2. No other parenchymal signal abnormality; no abnormal enhancement.

These results were called by telephone at the time of interpretation
on [DATE] at [DATE] to provider OLCAYALP , who verbally
acknowledged these results.

## 2021-08-06 IMAGING — MR MR CERVICAL SPINE WO/W CM
4 of 8 series · 19 of 48 positions shown · IV contrast (gadavist)
Comparison: None.

CLINICAL DATA: Neck pain, acute, infection suspected. History of IV
drug use.

EXAM:
MRI CERVICAL SPINE WITHOUT AND WITH CONTRAST
TECHNIQUE: Multiplanar and multiecho pulse sequences of the cervical spine, to
include the craniocervical junction and cervicothoracic junction,
were obtained without and with intravenous contrast.
CONTRAST:  5mL GADAVIST GADOBUTROL 1 MMOL/ML IV SOLN

[Series 2: T2 · sagittal · 3.0mm · 0.43mm/px · 4 of 14 slices shown (1 of 2)]
[im 1/14]
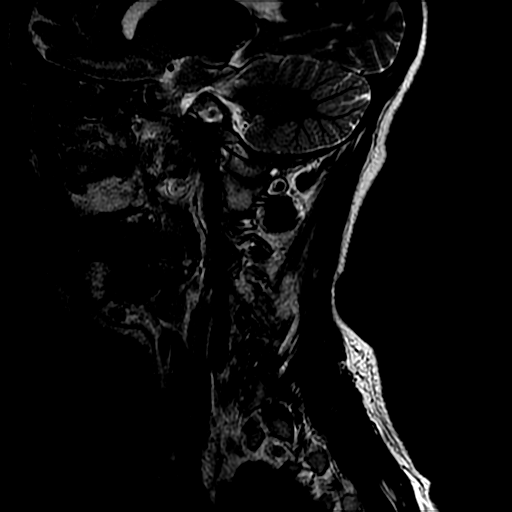
[im 5/14]
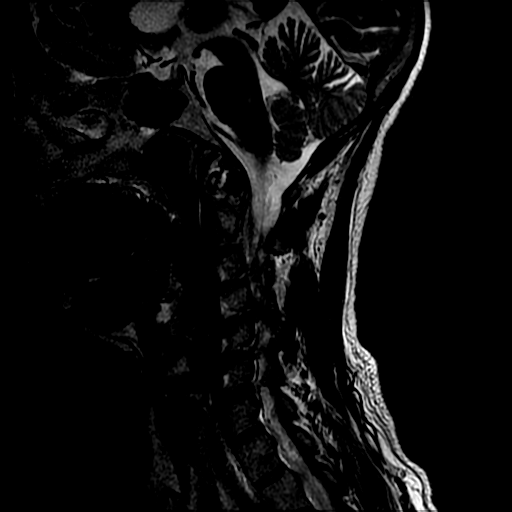
[im 9/14]
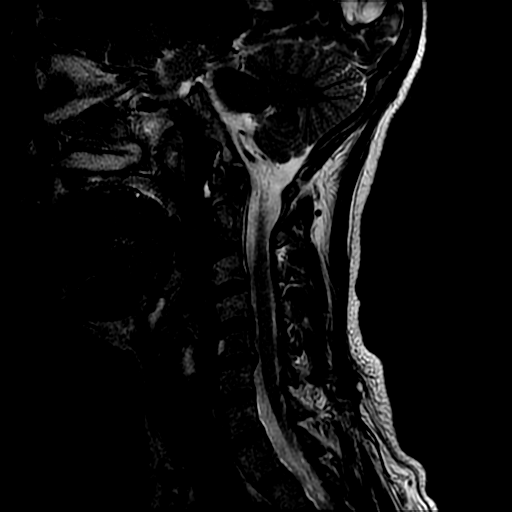
[im 14/14]
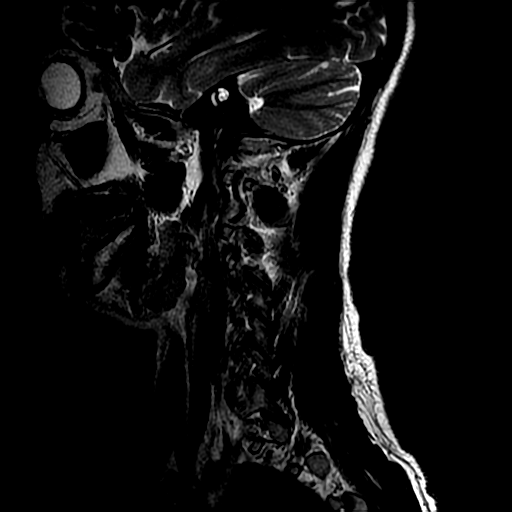

[Series 4: T1 · sagittal · 3.0mm · 0.43mm/px · 4 of 14 slices shown (1 of 2)]
[im 1/14]
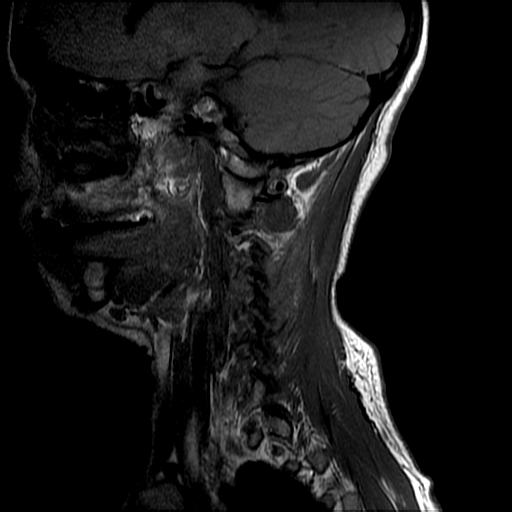
[im 5/14]
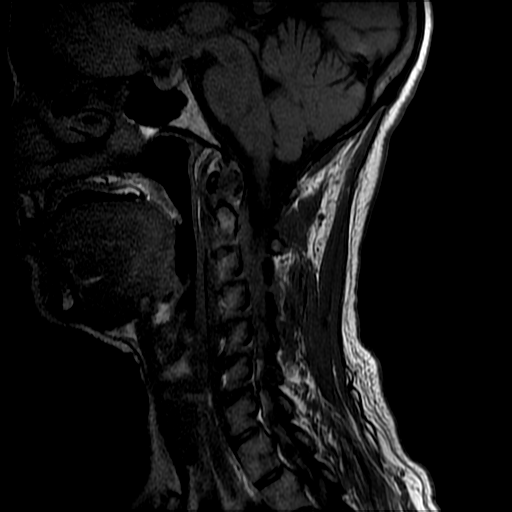
[im 9/14]
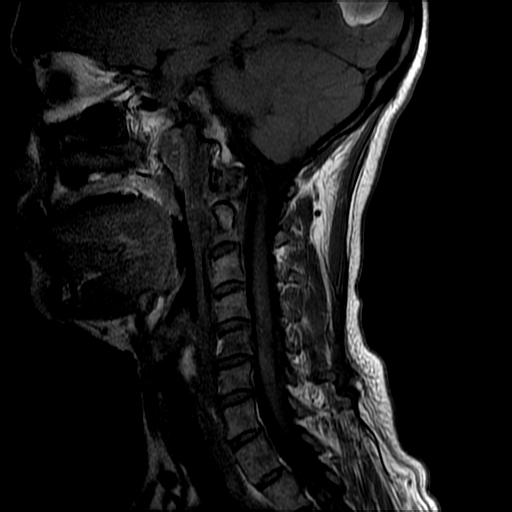
[im 14/14]
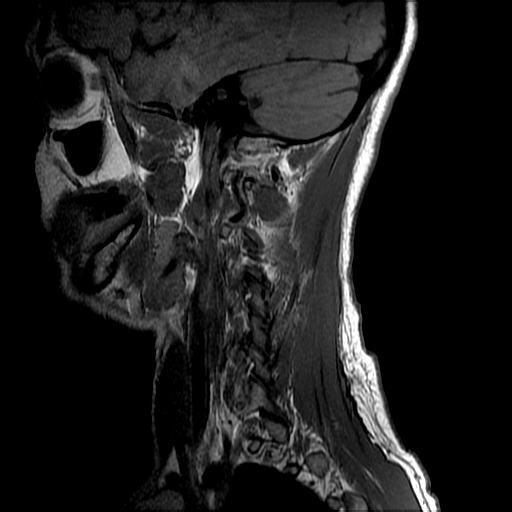

[Series 6: T2 · axial · 3.0mm · 0.39mm/px · z∈[-67,+24]mm · 8 of 28 slices shown (2 of 2)]
[im 1/28]
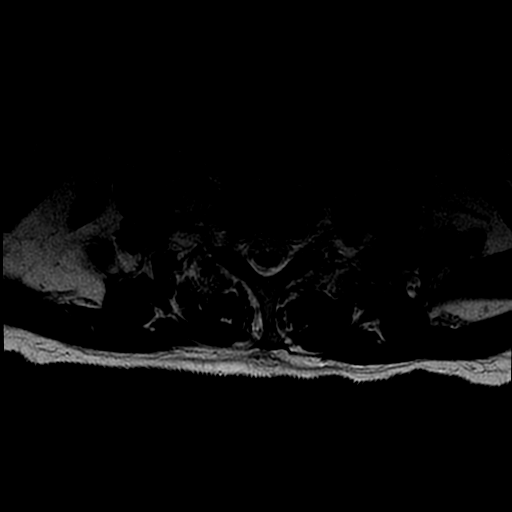
[im 4/28]
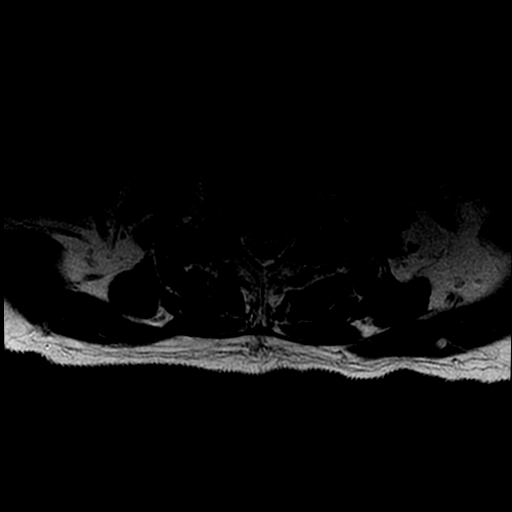
[im 8/28]
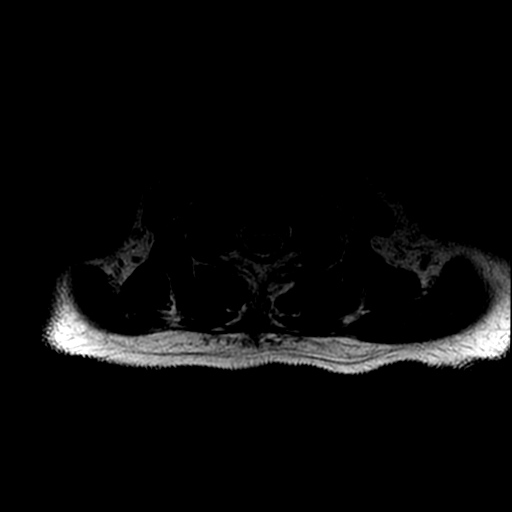
[im 12/28]
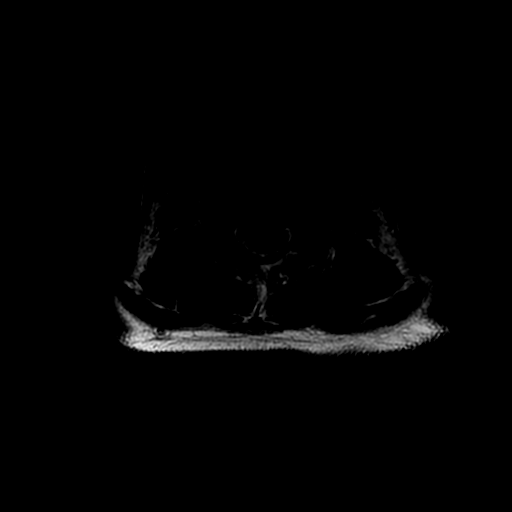
[im 16/28]
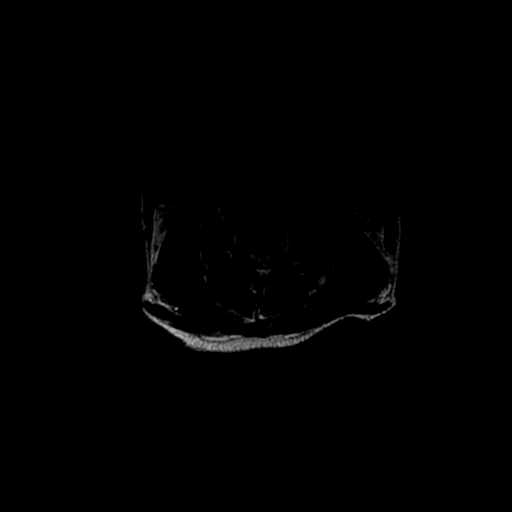
[im 20/28]
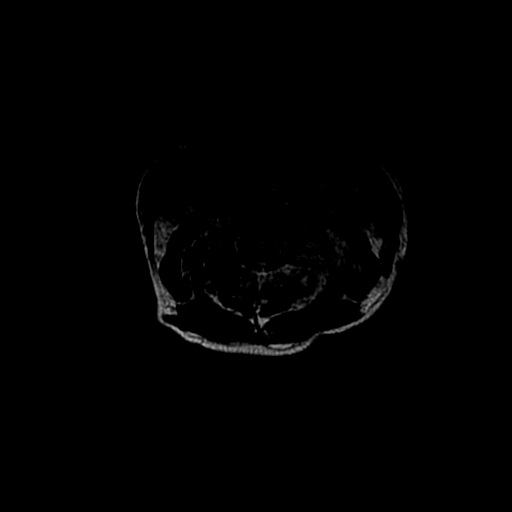
[im 24/28]
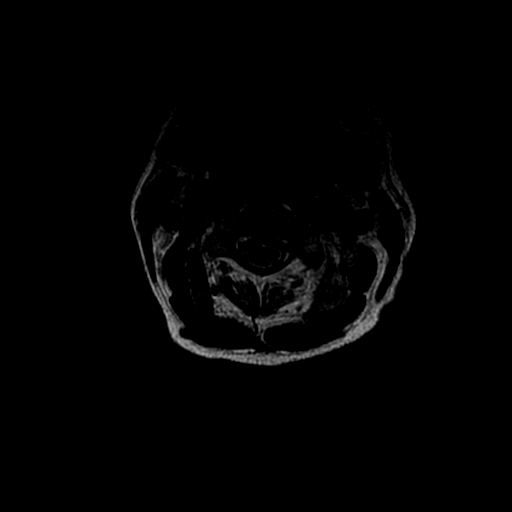
[im 28/28]
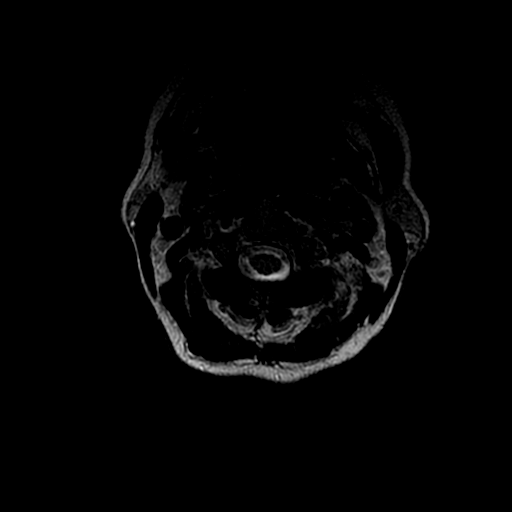

[Series 7: T1 · axial · non-contrast · 3.0mm · 0.39mm/px · z∈[-57,+10]mm · 3 of 28 slices shown (2 of 2)]
[im 4/28]
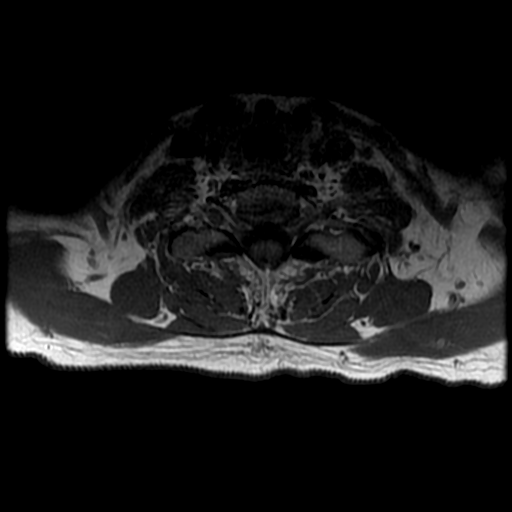
[im 16/28]
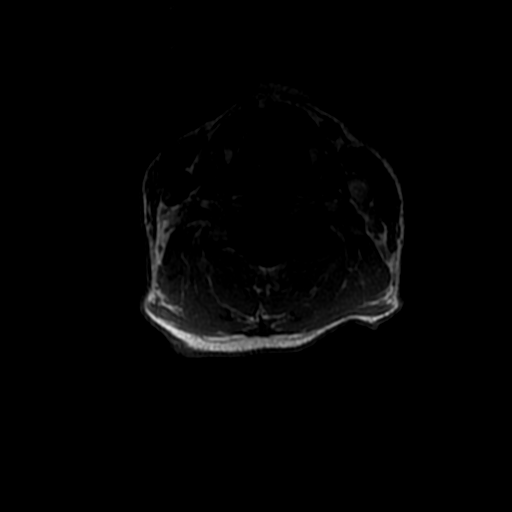
[im 24/28]
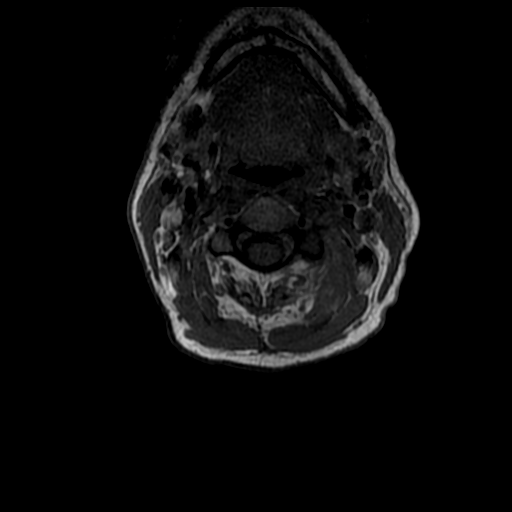

[19 of 48 positions shown; findings below may reference images not displayed]

FINDINGS: Alignment: Straightening of the normal cervical lordosis. No
listhesis.

Vertebrae: Marrow and periarticular soft tissue edema and
enhancement involving the left-sided joint at C3-4 greater than
C4-5. Mild dorsal epidural enhancement/inflammation from C3-4 to
C6-7 without abscess. No evidence of discitis or fracture.

Cord: Normal cord signal and morphology.

Posterior Fossa, vertebral arteries, paraspinal tissues: The brain
is more fully evaluated on the separate dedicated brain MRI.
Preserved vertebral artery flow voids. No paraspinal fluid
collection.

Disc levels:

C2-3: Negative.

C3-4: Inflammation about the left facet joint extending into the
left neural foramen. No spinal stenosis.

C4-5: Inflammation about the left facet joint extending into the
left neural foramen. No spinal stenosis.

C5-6: Disc bulging, uncovertebral spurring, and mild facet arthrosis
result in mild spinal stenosis without significant neural foraminal
stenosis.

C6-7: Negative.

C7-T1: Negative.
IMPRESSION: 1. Inflammation about the left-sided facet joints at C3-4 and C4-5
concerning for septic arthritis. Mild dorsal epidural inflammation.
No abscess.
2. Mild spinal stenosis at C5-6 due to spondylosis.

These results will be called to the ordering clinician or
representative by the Radiologist Assistant, and communication
documented in the PACS or [REDACTED].

## 2021-08-06 MED ORDER — GADOBUTROL 1 MMOL/ML IV SOLN
5.0000 mL | Freq: Once | INTRAVENOUS | Status: AC | PRN
  Administered 2021-08-06: 14:00:00 5 mL via INTRAVENOUS

## 2021-08-06 NOTE — Progress Notes (Addendum)
[facility] PROGRESS NOTE  Yvette Gaines UXL:[id number] DOB: 05/27/1971 DOA: 06/29/2021 PCP: [name], DO  Status: Remains inpatient appropriate because:  Unsafe to discharge patient with history of IVDU with PICC line in place for outpatient IV therapy Patient requires a total of 6 weeks of IV antibiotics with blood cultures have as of 11/3 and last dose due 12/10  Barriers to discharge: Social: IVDU history so unable to discharge with a PICC line in place to complete antibiotic therapies in the outpatient environment/home setting  Clinical: Final blood cultures from 10/29 returned negative as of 11/3 Has done well on Suboxone therefore we will continue this medication until a bed offer obtained and an actual discharge date confirmed before transitioning over to short acting scheduled Oxy IR  Level of care:  Med-Surg   Code Status: Full Family Communication: Patient DVT prophylaxis: Lovenox COVID vaccination status: Unknown  HPI: 50 y.o. female with medical history significant of mild intermittent asthma, recently diagnosed MRSA on tricuspid valve with bilateral pulmonary emboli on 06/24/2021 at [facility], she left [facility] 10/21, and came to [facility] seeking treatment.   Patient presented to [facility] on 10/05 for worsening of left shoulder swelling and pain.  Was found to have abscess of left shoulder and left calf,  I&D was done on 10/06 and culture showed MRSA.[name]  Patient however signed out [place] after the surgery. On 10/16, patient came back to [facility] complaining about new onset of chest pains and shortness of breath, CT chest showed multiple bilateral septic emboli, echocardiogram showed new onset of tricuspid vegetation compatible with endocarditis.  Blood culture showed again MRSA.  Patient was started on vancomycin and cefepime since. During hospital stay, patient was also found to develop acute thrombocytopenia, DIC was ruled out.   Patient was admitted to [facility], and started on IV vancomycin, TEE with tricuspid leaflet vegetation.   Subjective: Alert and sitting on side of the bed.  Continues to report significant cervical neck discomfort now more bilateral and in the base of the skull.  No significant improvement in symptoms with supportive care that includes heat and pharmacological measures.  Objective: Vitals:   08/06/21 0542 08/06/21 0812  BP: 102/67 (!) 103/57  Pulse: 70 73  Resp: 17 15  Temp: 97.8 F (36.6 C) 98.7 F (37.1 C)  SpO2: 100% 100%    Intake/Output Summary (Last 24 hours) at 08/06/2021 0838 Last data filed at 08/06/2021 0551 Gross per 24 hour  Intake 1240 ml  Output --  Net 1240 ml       Filed Weights   07/29/21 0500 07/31/21 0521 07/31/21 1500  Weight: 48.9 kg 47.5 kg 50.5 kg    Exam:  Constitutional: NAD, alert Respiratory: Posterior lung sounds CTA, room air, normal respiratory effort-pulse oximetry 99% Cardiovascular: S1-S2, pulse regular, normotensive, no peripheral edema Abdomen: no tenderness or distention, soft. Bowel sounds positive.  LBM 11/17-eating well Musculoskeletal: Tender bilateral cervical neck at base of spine without any redness, palpable masses or radiculopathy/neurological signs reproduced Neurologic: CN 2-12 grossly intact. Sensation intact, Strength 5/5 x all 4 extremities.  Psychiatric: Normal judgment and insight. Alert and oriented x 3.  Pleasant affect   Assessment/Plan: Acute problems: MRSA bacteremia/MRSA TV endocarditis with moderate TVR/septic emboli/recent right shoulder and right calf abscess /status post I&D 10/6 (at [facility])  -TEE: tricuspid valve endocarditis.  CT surgery: not a candidate for angio VAC due to small size of vegetation -Narrowed to vancomycin 11/5-LD due 12/10  Pleuritic chest pain Resolved -11/5 echocardiogram: Small residual vegetation noted on the anterior TV leaflet. -CTA chest : Continued evolution of  septic pulmonary emboli now globally increased surrounding consolidated opacifications which likely explains recent pleuritic chest discomfort  RLQ hematoma -Onset 11/21 after Lovenox injection -Begin ice packs then transition over to heat after 24 to 48 hours  Headache and sinus congestion -Continue Zyrtec and ibuprofen prn -see below re IP hematoma  Cervical neck discomfort -Continue K pad along with Robaxin and Voltaren gel -Continue to persist despite above measures and given current treatment for endocarditis will obtain MRI cervical spine with and without contrast to ensure no myositis or osteomyelitis @@ MRI Brain c/w 2.5 cm intraparenchymal hematoma (see details below)  Intraparenchymal hematoma -Seen on MRI brain 11/28-d/w attending and appears c/w hemorraghic transformation of septic emboli -Neurology consulted -Lovenox and Advil dc'd -Considered Depakene 500 mg IV x 1 but on backorder-consider Decadron for HA but defer to neuro  IVDU with narcotics -Continue Suboxone.  After bed offer secured and actual discharge date confirmed plan transition to Oxy IR 10 mg Q 6 hours as recommended by Pharmacist and discontinue Suboxone for duration of SNF stay.  -Recommend resume Suboxone once discharged from SNF. Previously received Suboxone at a clinic in [facility]  Depression and insomnia Continue preadmission Celexa, BuSpar and trazodone at bedtime  BuSpar dose increased to 7.5 mg TID on 11/17  Thrombocytopenia:  Resolved.   Recent right shoulder and calf abscess -Source of bacteremia -Status post I&D on 10/06 at [place] at outside hospital revealed no infectious involvement of AC joint    Severe protein calorie malnutrition Etiology: chronic illness and poor intake prior to admission secondary to IVDU Weight stable at 100.53 lbs  Mild intermittent asthma/current tobacco abuse -Currently asymptomatic -Continue prn bronchodilators and nicotine patch    Normocytic anemia with iron deficiency -Secondary to chronic malnutrition -Current hemoglobin 8.4 -Transfuse for hemoglobin </= 7.      +HCV AB -ID recommended treatment after discharge. -Also ID recommended HIV preexposure prophylaxis with either oral Truvada or long-acting aptitude injectable   Blood culture data: -Blood cultures positive for MRSA on 10/21, 10/22.   -Blood cultures 1/2 positive from 10/24. -Blood cultures 1/2 positive from 10/25. -Blood cultures 2 out of 2 positive from 10/27. -Blood cultures from 10/29 and finalized on 11/3   Data Reviewed: Basic Metabolic Panel: Recent Labs  Lab 07/31/21 0511 08/06/21 0251  NA  --  138  K  --  4.3  CL  --  102  CO2  --  29  GLUCOSE  --  105*  BUN  --  22*  CREATININE 0.72 0.72  CALCIUM  --  8.9     Scheduled Meds:  buprenorphine-naloxone  1.5 tablet Sublingual Daily   busPIRone  7.5 mg Oral TID   cetirizine HCl  5 mg Oral Daily   Chlorhexidine Gluconate Cloth  6 each Topical Daily   citalopram  20 mg Oral Daily   diclofenac Sodium  4 g Topical QID   enoxaparin (LOVENOX) injection  40 mg Subcutaneous Q24H   famotidine  20 mg Oral Daily   feeding supplement  237 mL Oral TID BM   methocarbamol  500 mg Oral TID   multivitamin with minerals  1 tablet Oral Daily   nicotine  21 mg Transdermal Daily   sodium chloride flush  10-40 mL Intracatheter Q12H   traZODone  150 mg Oral QHS   Continuous Infusions:  vancomycin 1,000  mg (08/05/21 2105)    Principal Problem:   MRSA bacteremia Active Problems:   Severe opioid use disorder ([place])   Endocarditis   Septic pulmonary embolism (HCC)   Chronic viral hepatitis C ([place])   Shoulder abscess   Calf abscess   Odynophagia   Protein-calorie malnutrition, severe   Chest pain   Consultants: Infectious disease Cardiothoracic surgery  Procedures: TTE TEE  Antibiotics: Vancomycin 10/21 through 10/28 Valacyclovir 10/23 through 10/29 Daptomycin 10/28 >>  11/05 Ceftaroline 10/28 >> 11/05 Vancomycin 11/05 >>   Time spent: 15 minutes    Erin Hearing ANP  [facility] 7 am - 330 pm/M-F for direct patient care and secure chat Please refer to Amion for contact info 38  days

## 2021-08-06 NOTE — Progress Notes (Signed)
Mobility Specialist Progress Note:   08/06/21 1200  Mobility  Activity Ambulated in hall  Level of Assistance Independent  Assistive Device None  Distance Ambulated (ft) 1150 ft  Mobility Ambulated independently in hallway  Mobility Response Tolerated well  Mobility performed by Mobility specialist  $Mobility charge 1 Mobility   Pt asx during ambulation. Transport here for MRI.  Nelta Numbers Mobility Specialist  Phone [phone]

## 2021-08-06 NOTE — Consult Note (Addendum)
Neurology Consultation  Reason for Consult: IPH  Requesting Physician: Dr. Sloan Leiter  CC: IPH  History is obtained from: chart and patient   HPI: Yvette Gaines is a 50 y.o. 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   Patient presented to [facility] on 10/05 for worsening of left shoulder swelling and pain.  Was found to have abscess of left shoulder and left calf,  I&D was done on 10/06 and culture showed MRSA.[name]  Patient however signed out [place] after the surgery.   On 10/16, patient came back to [facility] complaining about new onset of chest pains and shortness of breath, CT chest showed multiple bilateral septic emboli, echocardiogram showed new onset of tricuspid vegetation compatible with endocarditis.  Blood culture showed again MRSA.  Patient was started on vancomycin and cefepime since. During hospital stay, patient was thrombocytopenic, DIC was ruled out.    Patient was admitted to [facility], and started on IV vancomycin, TEE with tricuspid leaflet vegetation. Given history of IVDU with picc line in place, patient remains inpatient.   She has complained of persistent headache and neck pain, unresponsive to NSAIDS and SMRS, and MRI head and neck were obtained today, revealing by appearance a subacute right ocipital lobe IPH without shift. No enhancement seen on my review to suggest abscess. MRI cervical spine shows probable septic arthritis at C3-4 and C4-5  Neurology was consulted regarding the above, and we thank the primary team for their kind consult. Neurosurgery has been consulted for input.   ROS: All other review of systems was negative except as noted in the HPI.     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   Chicken pox    Depression    Frequent headaches    Manic depression (HCC)    Migraines    Urine incontinence       Family History  Problem Relation Age of Onset   Cancer Brother        brain, lung, spine   Cancer Maternal Aunt        bone and lung   Cancer Paternal Grandfather        prostate   Arthritis Paternal Grandfather    Hypertension Paternal Grandfather    Arthritis Mother    Mental illness Mother    Arthritis Father    Heart disease Father    Hypertension Father    Arthritis Maternal Grandmother    Diabetes Maternal Grandmother    Arthritis Maternal Grandfather    Arthritis Paternal Grandmother       Social History:  reports that she has been smoking cigarettes. She has been smoking an average of 1 pack per day. She has never used smokeless tobacco. She reports current drug use. She reports that she does not drink alcohol.    Exam: Current vital signs: BP 121/75 (BP Location: Left Arm)   Pulse 65   Temp 98.7 F (37.1 C) (Oral)   Resp 16   Ht 5\' 2"  (1.575 m)   Wt 50.5 kg   SpO2 99%   BMI 20.38 kg/m  Vital signs in last 24 hours: Temp:  [97.8 F (36.6 C)-98.7 F (37.1 C)] 98.7  F (37.1 C) (11/28 1535) Pulse Rate:  [65-73] 65 (11/28 1535) Resp:  [15-18] 16 (11/28 1535) BP: (96-121)/(57-75) 121/75 (11/28 1535) SpO2:  [93 %-100 %] 99 % (11/28 1535)   Physical Exam  Constitutional: Appears well-developed and well-nourished.  Psych: Affect appropriate to situation,   Eyes: No scleral injection HENT: No oropharyngeal obstruction.  MSK: no joint deformities.  Cardiovascular: Normal rate and regular rhythm.  Respiratory: Effort normal, non-labored breathing GI: Soft.  No distension. There is no tenderness.  Skin: Warm dry and intact visible skin  Neuro: Mental Status: Patient is awake, alert, oriented to person, place, month, year, and situation.  Patient is able to give a clear  and coherent history.  No signs of aphasia or neglect  Cranial Nerves: II:  Pupils are equal, round, and reactive to light.   She has left lower quadrant homonymous visual field cut.  III,IV, VI: EOMI without ptosis or diploplia.  V: Facial sensation is symmetric to temperature VII: Facial movement is symmetric.  VIII: hearing is intact to voice X: Palate elevates symmetrically XI: Shoulder shrug is symmetric. XII: tongue is midline without atrophy or fasciculations.  Motor: Tone is normal. Bulk is normal. 5/5 strength was present in all four extremities.   Sensory: Sensation is symmetric to light touch and temperature in the arms and legs.  Deep Tendon Reflexes: BUE reflexes 1+, BLE reflexes 3+ with bilateral sustained ankle clonus Cerebellar: FNF and HKS are intact bilaterally   I have reviewed the images obtained today: MRI brain shows a hyperintense collection at right occiptal lobe consistent with a subacute IPH without obvious signs of abscess. MRI cervical spine shows inflammation at C3-4 and C4-5 concerning for septic arthritis.    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  - Exam reveals left sided homonymous visual field cut.  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  - Continue wth endocarditis protocol with IV antibiotics.  - obtain lipid panel, A1c if not already obtained - repeat echocardiogram.  - PT/OT/ST - Neurosurgical evaluation - Seizure precautions- obtain EEG if there is seizure-like activity - Repeat brain CT if there is significant neurologic decline.   - Stroke neurology team to follow   I have seen and examined the patient. I have discussed the assessment and recommendations with the Neurology NP and have made amendations as needed. 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 Exam reveals left sided homonymous visual field cut. 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 Recommendations as above.  Electronically signed: Dr. Kerney Elbe

## 2021-08-06 NOTE — Progress Notes (Signed)
Subjective: No new complaints   Antibiotics:  Anti-infectives (From admission, onward)    Start     Dose/Rate Route Frequency Ordered Stop   07/14/21 2000  vancomycin (VANCOCIN) IVPB 1000 mg/200 mL premix        1,000 mg 200 mL/hr over 60 Minutes Intravenous Every 24 hours 07/12/21 1400     07/13/21 2000  vancomycin (VANCOREADY) IVPB 1250 mg/250 mL        1,250 mg 166.7 mL/hr over 90 Minutes Intravenous  Once 07/12/21 1400 07/13/21 2326   07/06/21 1400  ceftaroline (TEFLARO) 600 mg in sodium chloride 0.9 % 100 mL IVPB        600 mg 100 mL/hr over 60 Minutes Intravenous Every 8 hours 07/06/21 1033 07/12/21 2229   07/06/21 1330  DAPTOmycin (CUBICIN) 500 mg in sodium chloride 0.9 % IVPB        500 mg 120 mL/hr over 30 Minutes Intravenous Daily 07/06/21 1233 07/12/21 2022   07/01/21 2200  vancomycin (VANCOREADY) IVPB 750 mg/150 mL  Status:  Discontinued        750 mg 150 mL/hr over 60 Minutes Intravenous Every 12 hours 07/01/21 1314 07/06/21 1033   07/01/21 1400  valACYclovir (VALTREX) tablet 1,000 mg        1,000 mg Oral 2 times daily 07/01/21 1306 07/08/21 0959   06/30/21 0200  vancomycin (VANCOREADY) IVPB 750 mg/150 mL  Status:  Discontinued        750 mg 150 mL/hr over 60 Minutes Intravenous Every 12 hours 06/29/21 1253 06/29/21 1254   06/29/21 1700  vancomycin (VANCOREADY) IVPB 750 mg/150 mL  Status:  Discontinued        750 mg 150 mL/hr over 60 Minutes Intravenous Every 12 hours 06/29/21 1308 07/01/21 1301   06/29/21 1615  rifampin (RIFADIN) capsule 300 mg  Status:  Discontinued        300 mg Oral Daily 06/29/21 1606 06/29/21 1607   06/29/21 1130  vancomycin (VANCOREADY) IVPB 1250 mg/250 mL  Status:  Discontinued        1,250 mg 166.7 mL/hr over 90 Minutes Intravenous  Once 06/29/21 1124 06/29/21 1254   06/29/21 1130  ceFEPIme (MAXIPIME) 2 g in sodium chloride 0.9 % 100 mL IVPB  Status:  Discontinued        2 g 200 mL/hr over 30 Minutes Intravenous  Once 06/29/21  1124 06/29/21 1251       Medications: Scheduled Meds:  buprenorphine-naloxone  1.5 tablet Sublingual Daily   busPIRone  7.5 mg Oral TID   cetirizine HCl  5 mg Oral Daily   Chlorhexidine Gluconate Cloth  6 each Topical Daily   citalopram  20 mg Oral Daily   diclofenac Sodium  4 g Topical QID   enoxaparin (LOVENOX) injection  40 mg Subcutaneous Q24H   famotidine  20 mg Oral Daily   feeding supplement  237 mL Oral TID BM   methocarbamol  500 mg Oral TID   multivitamin with minerals  1 tablet Oral Daily   nicotine  21 mg Transdermal Daily   sodium chloride flush  10-40 mL Intracatheter Q12H   traZODone  150 mg Oral QHS   Continuous Infusions:  vancomycin 1,000 mg (08/05/21 2105)   PRN Meds:.acetaminophen **OR** acetaminophen, albuterol, ALPRAZolam, hydrocortisone cream, ibuprofen, influenza vac split quadrivalent PF, ondansetron **OR** ondansetron (ZOFRAN) IV, sodium chloride flush    Objective: Weight change:   Intake/Output Summary (Last 24 hours) at 08/06/2021 1317 Last  data filed at 08/06/2021 0551 Gross per 24 hour  Intake 1060 ml  Output --  Net 1060 ml   Blood pressure (!) 103/57, pulse 73, temperature 98.7 F (37.1 C), temperature source Oral, resp. rate 15, height 5\' 2"  (1.575 m), weight 50.5 kg, SpO2 100 %. Temp:  [97.8 F (36.6 C)-98.7 F (37.1 C)] 98.7 F (37.1 C) (11/28 0812) Pulse Rate:  [68-73] 73 (11/28 0812) Resp:  [15-18] 15 (11/28 0812) BP: (96-110)/(57-69) 103/57 (11/28 0812) SpO2:  [93 %-100 %] 100 % (11/28 1884)  Physical Exam: Physical Exam Constitutional:      General: She is not in acute distress.    Appearance: She is well-developed. She is not diaphoretic.  HENT:     Head: Normocephalic and atraumatic.     Right Ear: External ear normal.     Left Ear: External ear normal.     Mouth/Throat:     Pharynx: No oropharyngeal exudate.  Eyes:     General: No scleral icterus.    Extraocular Movements: Extraocular movements intact.      Conjunctiva/sclera: Conjunctivae normal.     Pupils: Pupils are equal, round, and reactive to light.  Cardiovascular:     Rate and Rhythm: Normal rate and regular rhythm.     Heart sounds: No murmur heard. Pulmonary:     Effort: Pulmonary effort is normal. No respiratory distress.     Breath sounds: No wheezing or rales.  Abdominal:     General: There is no distension.     Palpations: Abdomen is soft.     Tenderness: There is no abdominal tenderness. There is no rebound.  Musculoskeletal:        General: No tenderness. Normal range of motion.  Lymphadenopathy:     Cervical: No cervical adenopathy.  Skin:    General: Skin is warm and dry.     Coloration: Skin is not pale.     Findings: No erythema or rash.  Neurological:     General: No focal deficit present.     Mental Status: She is alert and oriented to person, place, and time.     Motor: No abnormal muscle tone.     Coordination: Coordination normal.  Psychiatric:        Mood and Affect: Mood normal.        Behavior: Behavior normal.        Thought Content: Thought content normal.        Judgment: Judgment normal.     CBC:    BMET Recent Labs    08/06/21 0251  NA 138  K 4.3  CL 102  CO2 29  GLUCOSE 105*  BUN 22*  CREATININE 0.72  CALCIUM 8.9     Liver Panel  Recent Labs    08/06/21 0251  PROT 6.3*  ALBUMIN 3.1*  AST 17  ALT 15  ALKPHOS 76  BILITOT 0.6       Sedimentation Rate No results for input(s): ESRSEDRATE in the last 72 hours. C-Reactive Protein No results for input(s): CRP in the last 72 hours.  Micro Results: Recent Results (from the past 720 hour(s))  Culture, blood (Routine X 2) w Reflex to ID Panel     Status: None   Collection Time: 07/07/21  2:38 PM   Specimen: BLOOD RIGHT ARM  Result Value Ref Range Status   Specimen Description BLOOD RIGHT ARM  Final   Special Requests   Final    BOTTLES DRAWN AEROBIC ONLY Blood Culture results may  not be optimal due to an inadequate  volume of blood received in culture bottles   Culture   Final    NO GROWTH 5 DAYS Performed at [facility], [place] [address], [place], [place] [address]    Report Status 07/12/2021 FINAL  Final  Culture, blood (Routine X 2) w Reflex to ID Panel     Status: None   Collection Time: 07/07/21  2:39 PM   Specimen: BLOOD RIGHT HAND  Result Value Ref Range Status   Specimen Description BLOOD RIGHT HAND  Final   Special Requests   Final    BOTTLES DRAWN AEROBIC ONLY Blood Culture results may not be optimal due to an inadequate volume of blood received in culture bottles   Culture   Final    NO GROWTH 5 DAYS Performed at [facility], [place] [address] [place], [place] [address]    Report Status 07/12/2021 FINAL  Final    Studies/Results: No results found.    Assessment/Plan:  INTERVAL HISTORY: patient is doing well on vancomycin and Suboxone   Principal Problem:   MRSA bacteremia Active Problems:   Severe opioid use disorder ([place])   Endocarditis   Septic pulmonary embolism (HCC)   Chronic viral hepatitis C (HCC)   Shoulder abscess   Calf abscess   Odynophagia   Protein-calorie malnutrition, severe   Chest pain    Caron Ode is a 50 y.o. 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  #1  MRSA bacteremia and tricuspid valve endocarditis with septic emboli the lungs:  Continue vancomycin as inpatient with last day of therapy being August 18, 2021.  I have made an appointment with Terri Piedra, NP  for follow-up so we can check some surveillance blood cultures  #2 history of hepatitis C seropositivity hepatitis C was not detected on quant RNA.  #3 IVDU: she is really doing so well on suboxone and has appt after DC. Just needs meds to bridge her to that appt.Purvis Sheffield has an appointment on 09/12/2020 with Terri Piedra, [facility] is located in the [facility] at  [address] in [place].  [address], which is located to the left of the elevators.  Phone: [phone]  Fax: [phone]  [email or web address]  She should arrive 15-30 minutes prior to her appointment.  I will sign off for now please call with further questions.  We are happy to help her in the outpatient world and certainly should anything else come up in the meantime.    LOS: 38 days   Alcide Evener 08/06/2021, 1:17 PM

## 2021-08-06 NOTE — Consult Note (Signed)
Neurosurgery Consultation  Reason for Consult: [place] / septic cervical arthritis Referring Physician: Sloan Leiter  CC: Neck pain  HPI: This is a 50 y.o. woman w/ MRSA bacteremia / endocarditis w/ septic emboli, has been inpatient and complained of headaches / neck pain, so MRI C-spine / brain was obtained. Today she does endorse neck pain with me as well as complaining of symptoms c/w a left sided field cut. No new weakness, numbness, or parasthesias, no recent change in bowel or bladder function. She does have some baseline pelvic floor dysfunction that causes some urinary incontinence but this is stable / at her baseline, no c/o ataxia prior to this, has not been 'chasing the dragon'.    ROS: A 50 point ROS was performed and is negative except as noted in the HPI.   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   Chicken pox    Depression    Frequent headaches    Manic depression (HCC)    Migraines    Urine incontinence    FamHx:  Family History  Problem Relation Age of Onset   Cancer Brother        brain, lung, spine   Cancer Maternal Aunt        bone and lung   Cancer Paternal Grandfather        prostate   Arthritis Paternal Grandfather    Hypertension Paternal Grandfather    Arthritis Mother    Mental illness Mother    Arthritis Father    Heart disease Father    Hypertension Father    Arthritis Maternal Grandmother    Diabetes Maternal Grandmother    Arthritis Maternal Grandfather    Arthritis Paternal Grandmother    SocHx:  reports that she has been smoking cigarettes. She has been smoking an average of 1 pack per day. She has never used smokeless tobacco. She reports current drug use. She reports that she does not drink alcohol.  Exam: Vital signs in last 24 hours: Temp:  [97.8 F (36.6 C)-98.7 F (37.1 C)] 98.7 F (37.1 C) (11/28 1535) Pulse Rate:  [65-73] 65 (11/28 1535) Resp:   [15-18] 16 (11/28 1535) BP: (96-121)/(57-75) 121/75 (11/28 1535) SpO2:  [93 %-100 %] 99 % (11/28 1535) General:Awake, alert, cooperative, lying in bed in NAD Head: Normocephalic and atruamatic HEENT: Neck supple Pulmonary: breathing room air comfortably, no evidence of increased work of breathing Cardiac: RRR Abdomen: S NT ND Extremities: Warm and well perfused x4 Neuro: AOx3, PERRL, EOMI, FS & SS, +left HH inferior worse than superior Strength 5/5 x4, SILTx4, a very mild possible hoffman's on the right but minimal if present, none on the left, BUE reflexes 1+, BLE reflexes 3+ with bilateral sustained ankle clonus   Assessment and Plan: 50 y.o. woman w/ IVDU / septic emboli. MRI brain personally reviewed, which shows a right occipital likely subacute hematoma with susceptibility around the rim, no enhancement to suggest abscess / cerebritis, slight irregularity could be adjacent resolving SAH. C-spine MRI reviewed, shows 2 levels of unilateral facet arthritis at C3-4 and C4-5 on the left, no epidural abscess.  -I think it's unlikely, but to be safe, we should get a CTA head to make sure there's not a mycotic aneurysm underlying that hematoma. But no surgical intervention needed for the ICH itself. -For the cervical facet arthritis, if it's infectious then it should resolve with  antibiotics without further issue, no surgical intervention needed -given the fairly impressive myelopathy signs in her BLE, will get an MRI T-spine w/wo as well to make sure there's not a thoracic abscess. Certainly at risk for metabolic causes of myelopathy or TM as well, but less likely.  -please call with any concerns or questions  Judith Part, MD 08/06/21 3:42 PM [facility]

## 2021-08-07 ENCOUNTER — Inpatient Hospital Stay (HOSPITAL_COMMUNITY): Payer: 59

## 2021-08-07 DIAGNOSIS — I609 Nontraumatic subarachnoid hemorrhage, unspecified: Secondary | ICD-10-CM

## 2021-08-07 DIAGNOSIS — R7881 Bacteremia: Secondary | ICD-10-CM | POA: Diagnosis not present

## 2021-08-07 DIAGNOSIS — I2690 Septic pulmonary embolism without acute cor pulmonale: Secondary | ICD-10-CM | POA: Diagnosis not present

## 2021-08-07 DIAGNOSIS — I6310 Cerebral infarction due to embolism of unspecified precerebral artery: Secondary | ICD-10-CM | POA: Diagnosis not present

## 2021-08-07 DIAGNOSIS — I6340 Cerebral infarction due to embolism of unspecified cerebral artery: Secondary | ICD-10-CM | POA: Insufficient documentation

## 2021-08-07 DIAGNOSIS — I330 Acute and subacute infective endocarditis: Secondary | ICD-10-CM | POA: Diagnosis not present

## 2021-08-07 DIAGNOSIS — I38 Endocarditis, valve unspecified: Secondary | ICD-10-CM

## 2021-08-07 DIAGNOSIS — I611 Nontraumatic intracerebral hemorrhage in hemisphere, cortical: Secondary | ICD-10-CM

## 2021-08-07 DIAGNOSIS — B9562 Methicillin resistant Staphylococcus aureus infection as the cause of diseases classified elsewhere: Secondary | ICD-10-CM | POA: Diagnosis not present

## 2021-08-07 DIAGNOSIS — I619 Nontraumatic intracerebral hemorrhage, unspecified: Secondary | ICD-10-CM | POA: Insufficient documentation

## 2021-08-07 DIAGNOSIS — M4642 Discitis, unspecified, cervical region: Secondary | ICD-10-CM

## 2021-08-07 DIAGNOSIS — I6330 Cerebral infarction due to thrombosis of unspecified cerebral artery: Secondary | ICD-10-CM | POA: Insufficient documentation

## 2021-08-07 DIAGNOSIS — L02419 Cutaneous abscess of limb, unspecified: Secondary | ICD-10-CM | POA: Diagnosis not present

## 2021-08-07 LAB — ECHOCARDIOGRAM COMPLETE
Area-P 1/2: 4.39 cm2
Calc EF: 55.8 %
Height: 62 in
S' Lateral: 3.1 cm
Single Plane A2C EF: 64.1 %
Single Plane A4C EF: 51.4 %
Weight: 1782.4 oz

## 2021-08-07 LAB — LIPID PANEL
Cholesterol: 123 mg/dL (ref 0–200)
HDL: 60 mg/dL (ref >40)
LDL Cholesterol: 53 mg/dL (ref 0–99)
Total CHOL/HDL Ratio: 2.1 RATIO
Triglycerides: 50 mg/dL (ref <150)
VLDL: 10 mg/dL (ref 0–40)

## 2021-08-07 LAB — CREATININE, SERUM
Creatinine, Ser: 0.71 mg/dL (ref 0.44–1.00)
GFR, Estimated: >60 mL/min (ref >60)

## 2021-08-07 LAB — VANCOMYCIN, TROUGH: Vancomycin Tr: 8 ug/mL — ABNORMAL LOW (ref 15–20)

## 2021-08-07 LAB — HEMOGLOBIN A1C
Hgb A1c MFr Bld: 4.8 % (ref 4.8–5.6)
Mean Plasma Glucose: 91 mg/dL

## 2021-08-07 IMAGING — MR MR THORACIC SPINE WO/W CM
4 of 9 series · 19 of 48 positions shown · IV contrast (gadavist)
Comparison: None.

CLINICAL DATA: Myelopathy, acute or progressive thoracic
myelopathy, septic cervical arthritis, eval for epidural abscess.

EXAM:
MRI THORACIC WITHOUT AND WITH CONTRAST
TECHNIQUE: Multiplanar and multiecho pulse sequences of the thoracic spine were
obtained without and with intravenous contrast.
CONTRAST:  5mL GADAVIST GADOBUTROL 1 MMOL/ML IV SOLN

[Series 5: T2 · sagittal · 3.0mm · 0.62mm/px · 4 of 14 slices shown (1 of 2)]
[im 1/14]
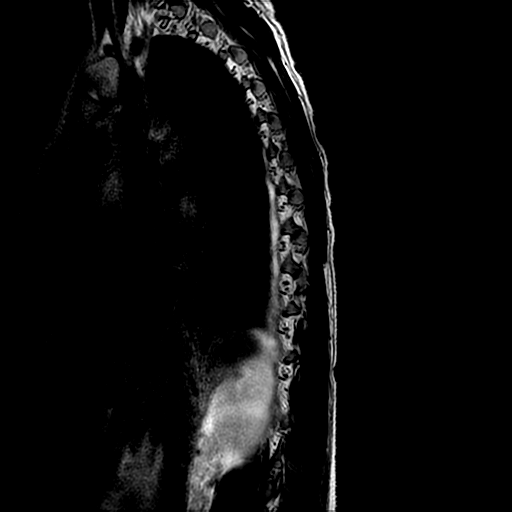
[im 5/14]
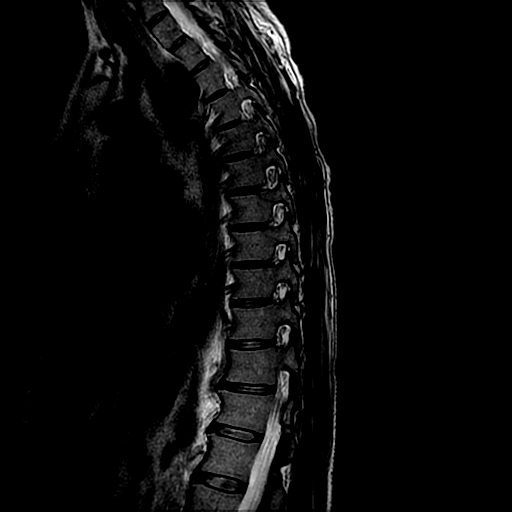
[im 9/14]
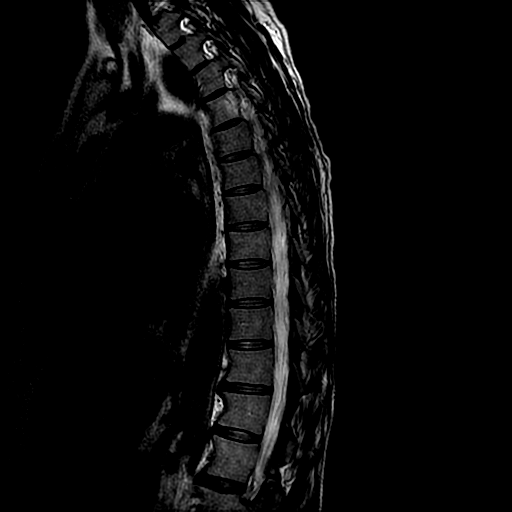
[im 14/14]
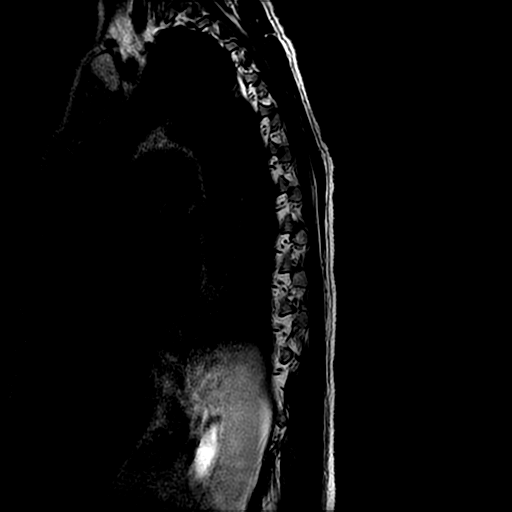

[Series 7: T1 · sagittal · 3.0mm · 0.62mm/px · 3 of 14 slices shown (1 of 2)]
[im 1/14]
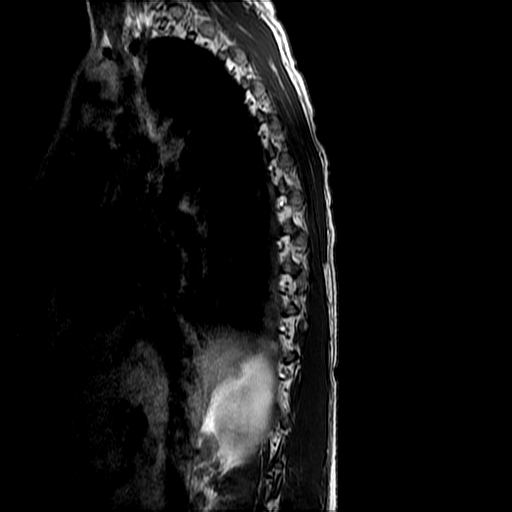
[im 7/14]
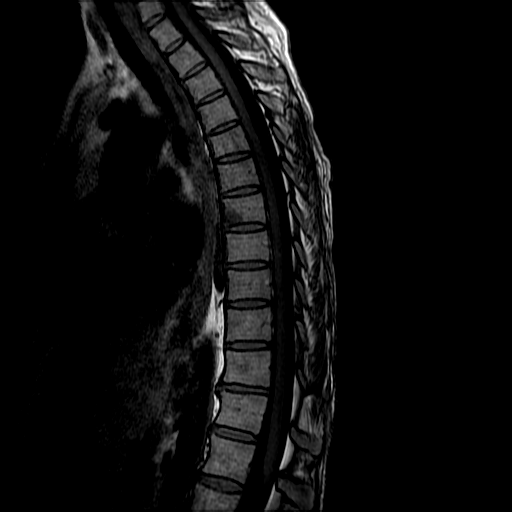
[im 14/14]
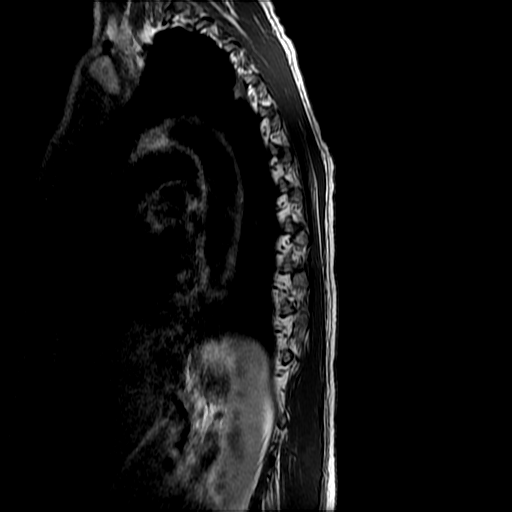

[Series 9: T2 · axial · 4.0mm · 0.43mm/px · z∈[-258,-76]mm · 8 of 36 slices shown (2 of 2)]
[im 1/36]
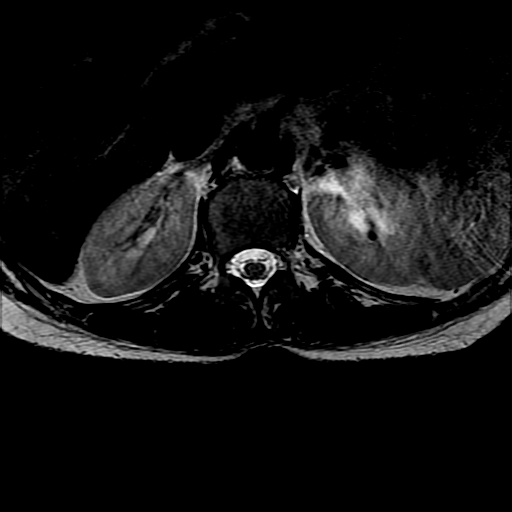
[im 6/36]
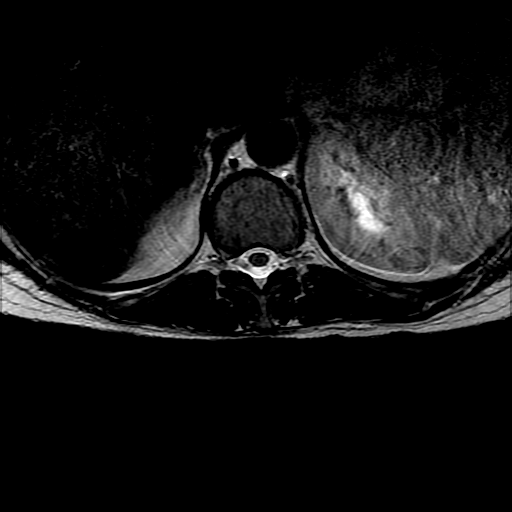
[im 11/36]
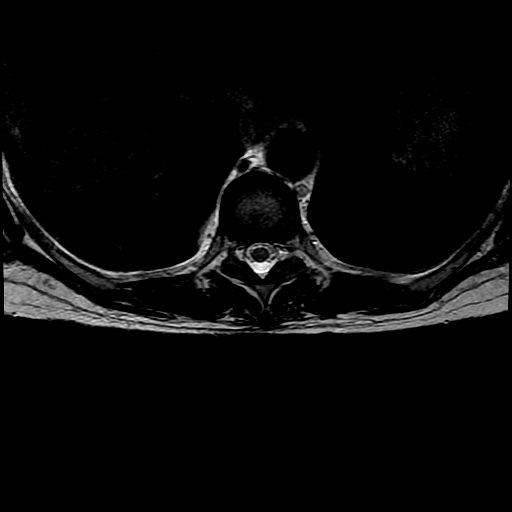
[im 16/36]
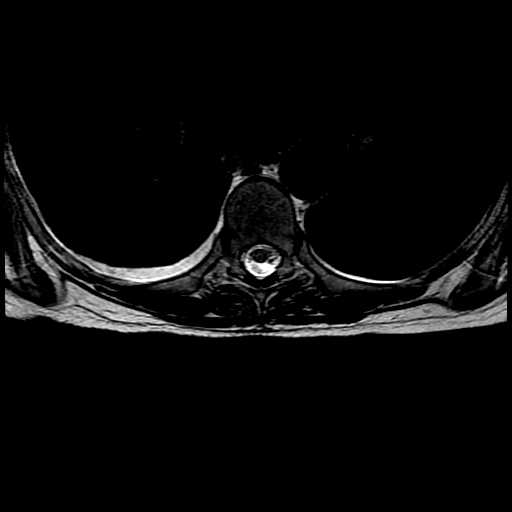
[im 21/36]
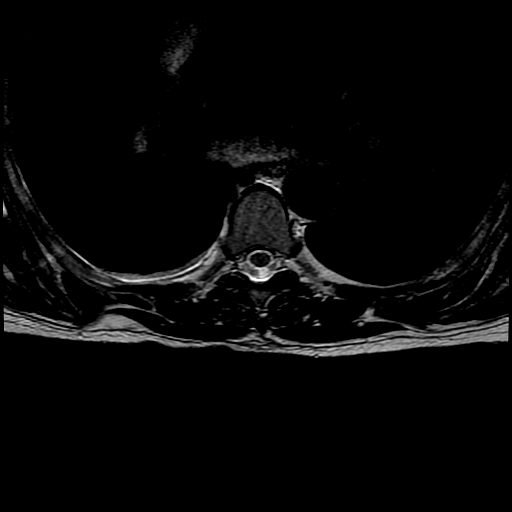
[im 26/36]
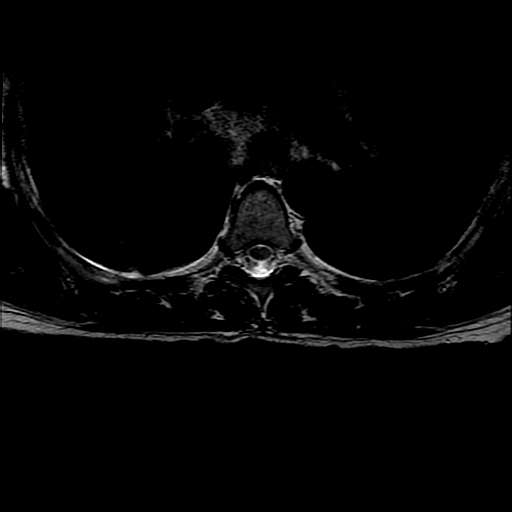
[im 31/36]
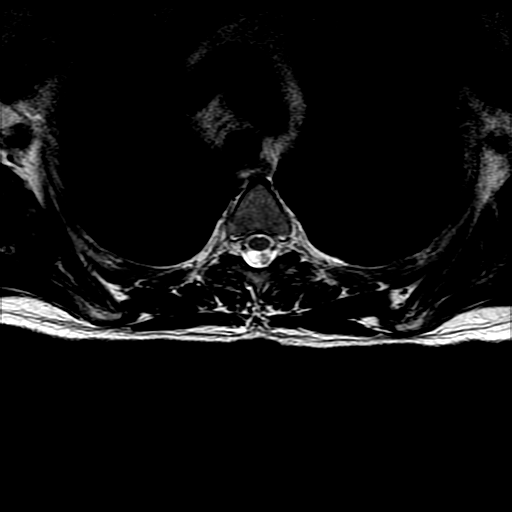
[im 36/36]
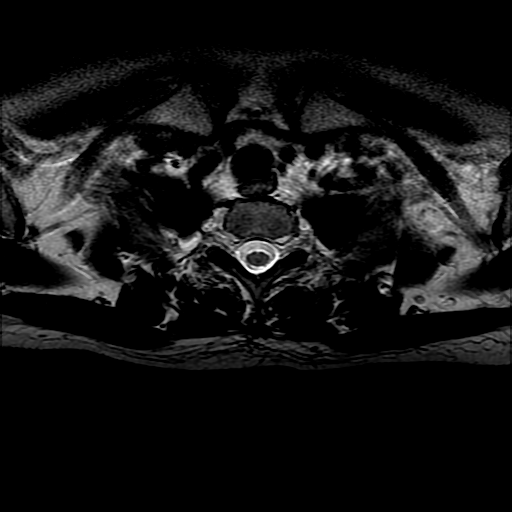

[Series 10: T1 · axial · non-contrast · 4.0mm · 0.43mm/px · z∈[-258,-96]mm · 4 of 36 slices shown (2 of 2)]
[im 1/36]
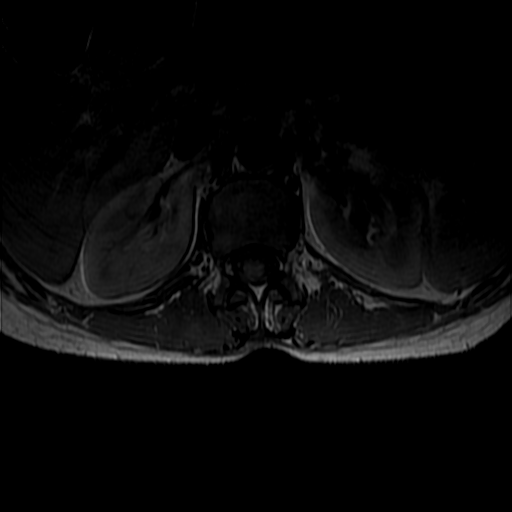
[im 6/36]
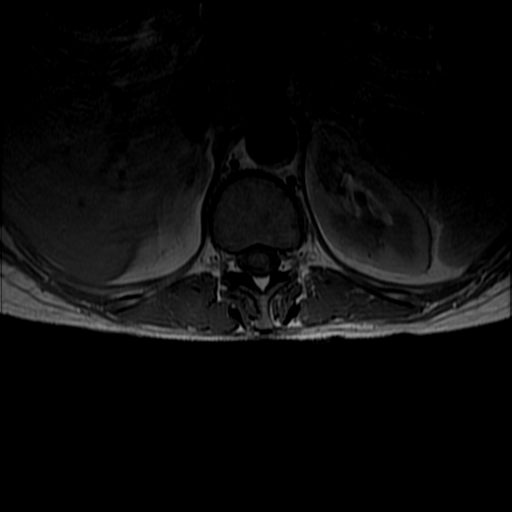
[im 21/36]
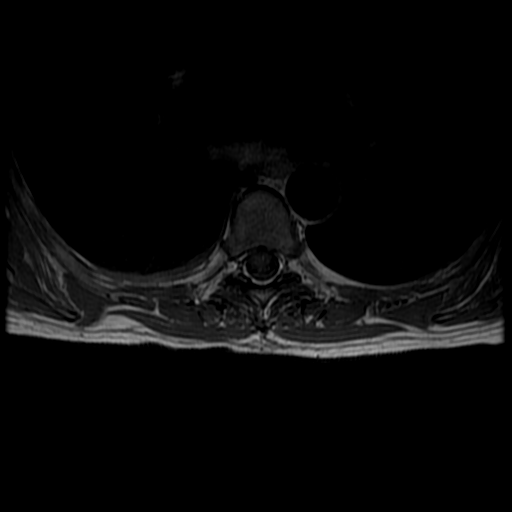
[im 31/36]
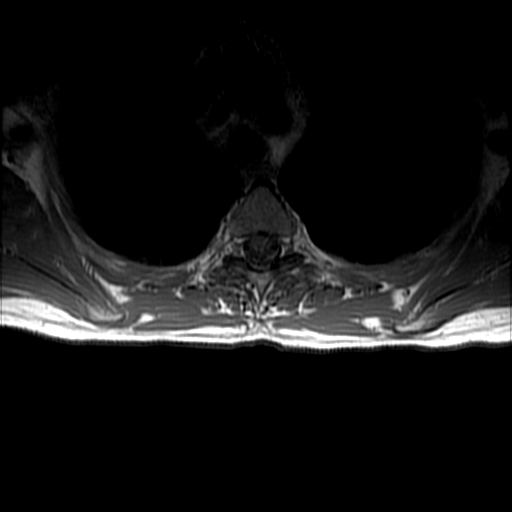

[19 of 48 positions shown; findings below may reference images not displayed]

FINDINGS: Alignment:  Physiologic.

Vertebrae: No fracture, evidence of discitis, or bone lesion.

Cord: Normal signal and morphology. No epidural collection. No
abnormal contrast enhancement.

Paraspinal and other soft tissues: Multiple nodular opacities
throughout the lungs compatible with previously reported septic
emboli.

Disc levels:

No spinal canal stenosis.
IMPRESSION: No epidural abscess or discitis-osteomyelitis.

## 2021-08-07 IMAGING — CT CT ANGIO HEAD-NECK (W OR W/O PERF)
2 of 11 series · 7 of 33 positions shown · IV contrast (OMNI 350)
Comparison: Brain and cervical spine MRI [DATE].

CLINICAL DATA: 56-year-old female with IV drug use, MRSA
bacteremia, endocarditis. Right occipital lobe hemorrhage on MRI.
Query mycotic aneurysm.

EXAM:
CT ANGIOGRAPHY HEAD AND NECK
TECHNIQUE: Multidetector CT imaging of the head and neck was performed using
the standard protocol during bolus administration of intravenous
contrast. Multiplanar CT image reconstructions and MIPs were
obtained to evaluate the vascular anatomy. Carotid stenosis
measurements (when applicable) are obtained utilizing NASCET
criteria, using the distal internal carotid diameter as the
denominator.
CONTRAST:  100mL OMNIPAQUE IOHEXOL 350 MG/ML SOLN

[Series 9: cta neck · axial · 0.46mm/px · z∈[+856,+976]mm · 2 of 178 slices shown]
[im 60/178  soft-tissue]
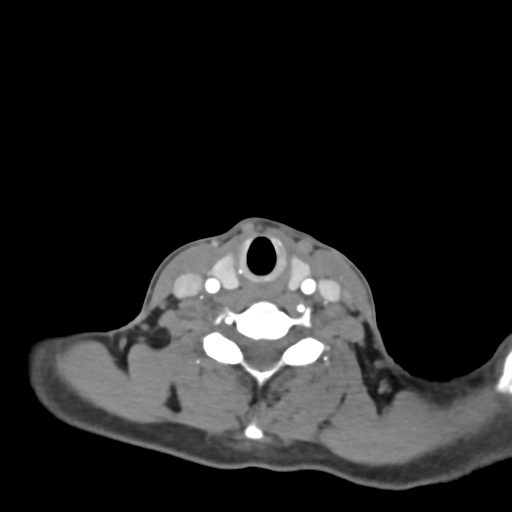
[im 119/178  soft-tissue]
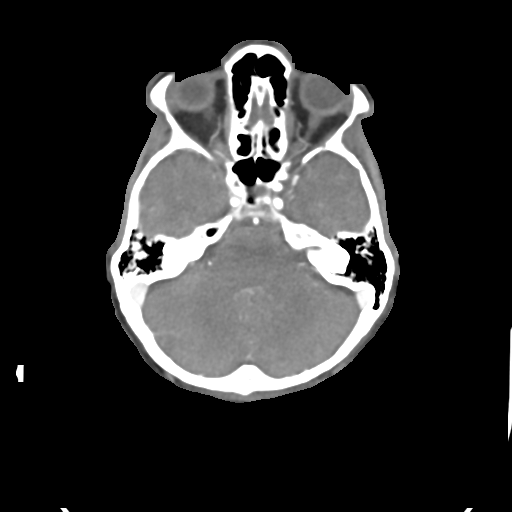

[Series 11: cta neck axial · axial · 0.39mm/px · z∈[+798,+1034]mm · 5 of 356 slices shown]
[im 60/356  soft-tissue]
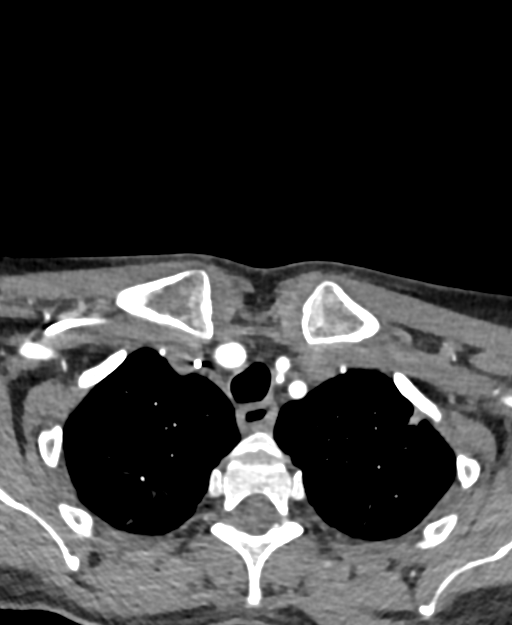
[im 119/356  bone]
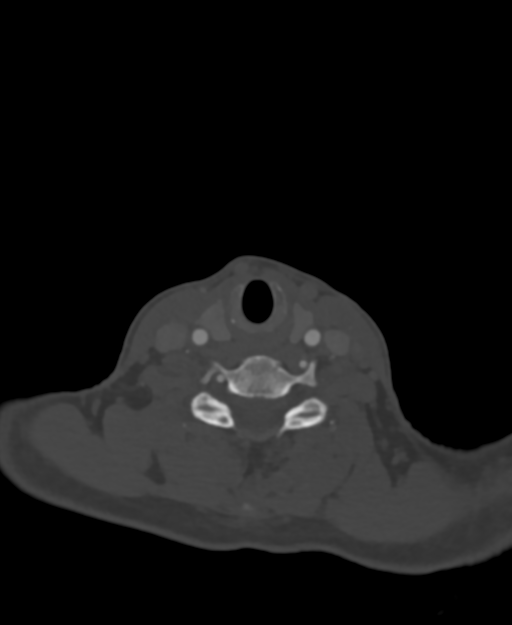
[im 178/356  soft-tissue]
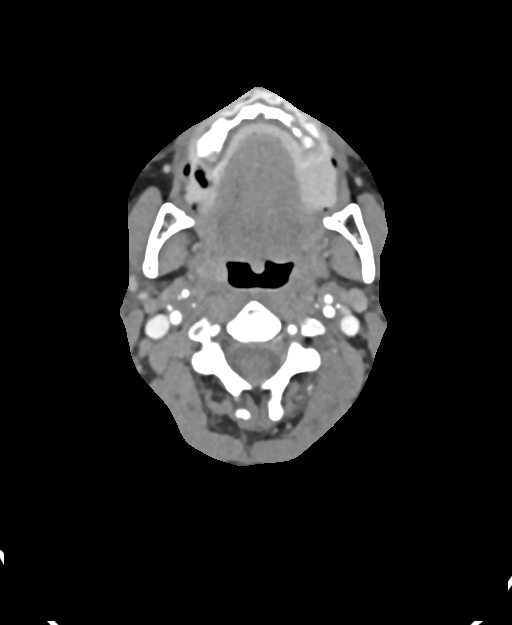
[im 237/356  bone]
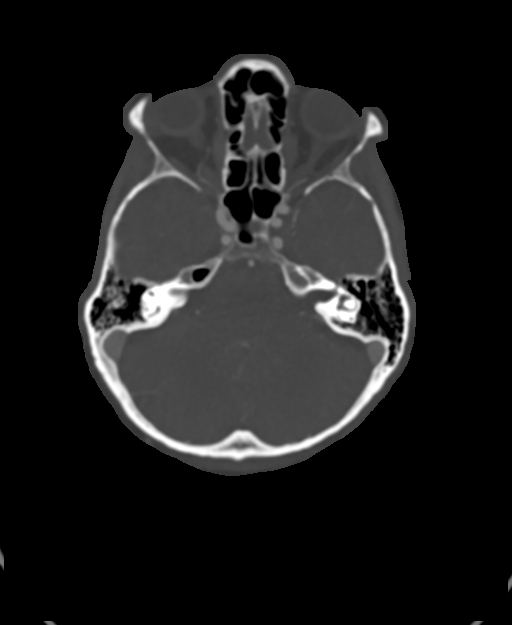
[im 296/356  soft-tissue]
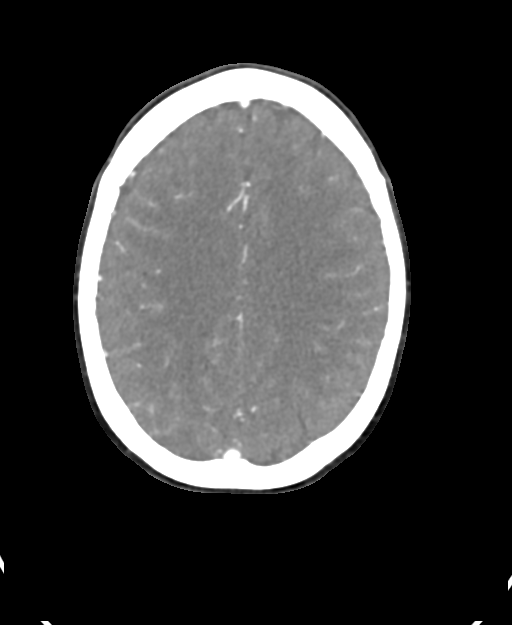

[7 of 33 positions shown; findings below may reference images not displayed]

FINDINGS: CT HEAD

Brain: In the abnormal right occipital pole there is a roughly 2 cm
rounded area of hypodensity corresponding to the hemosiderin and
mixed signal on MRI (series 5, image 17) with an adjacent linear
area of chronic appearing encephalomalacia. No hyperdense hemorrhage
is identified. No significant regional mass effect.

Gray-white matter differentiation elsewhere is within normal limits.
No ventriculomegaly. No other encephalomalacia identified.

Calvarium and skull base: Negative.

Paranasal sinuses: Visualized paranasal sinuses and mastoids are
stable and well aerated.

Orbits: Visualized orbits and scalp soft tissues are within normal
limits.

CTA NECK

Skeleton: Carious and absent dentition. Left side C4-C5 facet
hypertrophy but also subchondral irregularity and sclerosis. See
recent cervical spine MRI.

Upper chest: Centrilobular emphysema with scattered nodular, mostly
peripheral and patchy areas of lung opacity. No cavitating nodules.
No pleural fluid. Reactive appearing mediastinal lymph nodes.
Visible central pulmonary arteries patent. There is a right side
PICC line in place.

Other neck: Negative soft tissue spaces of the neck. Bilateral
cervical lymph nodes are somewhat numerous but not enlarged.

Aortic arch: 3 vessel arch configuration with no arch
atherosclerosis.

Right carotid system: Minor atherosclerosis at the right ECA origin.
Negative cervical right ICA.

Left carotid system: Similar mild plaque at the anterior carotid
bifurcation, ECA origin. Negative cervical left ICA.

Vertebral arteries:
Negative proximal right subclavian artery and right vertebral artery
origin. Negative right vertebral artery to the skull base.

Negative proximal left subclavian artery with a somewhat early
origin of the left vertebral artery, normal variant. Left vertebral
also has a slightly late entry into the cervical transverse foramen,
but is otherwise patent and normal to the skull base.

CTA HEAD

Posterior circulation: Negative distal vertebral arteries,
vertebrobasilar junction. Patent PICA origins. Patent basilar artery
without stenosis. Dominant appearing left AICA origin is patent.
Patent SCA and left PCA origins. Fetal type right PCA origin. Left
posterior communicating artery diminutive or absent.

There is mild irregularity of the distal left PCA branches as seen
on series 16, image 20. Right PCA P3 branches are attenuated
distally. No right PCA or occipital aneurysm identified. No CTA spot
sign or abnormal vascularity associated with the occipital pole.

Anterior circulation: Both ICA siphons are tortuous but patent
without plaque or stenosis. Normal right posterior communicating
artery origin. Patent carotid termini. Normal MCA and ACA origins.
Normal anterior communicating artery and bilateral ACA branches.
Left MCA M1 segment and trifurcation are patent without stenosis.
Right MCA M1 segment and trifurcation are patent without stenosis.
Bilateral MCA branches are within normal limits.

Venous sinuses: Patent, within normal limits.

Anatomic variants: Fetal right PCA origin. Early origin of the left
vertebral artery.

Review of the MIP images confirms the above findings
IMPRESSION: 1. Negative for intracranial aneurysm, and the right occipital blood
products are hypodense by CT suggesting a subacute bleed.
No associated CTA spot sign or abnormal vascularity. There is mild
irregularity of the distal bilateral PCA branches.

2. No new intracranial abnormality. And otherwise negative CTA head
and neck, with minimal cervical carotid atherosclerosis.

3. Multifocal abnormal pulmonary opacity could reflect a combination
of disseminated infection and scarring. Underlying Emphysema
([PO]-[PO]).

## 2021-08-07 MED ORDER — VANCOMYCIN HCL 1250 MG/250ML IV SOLN
1250.0000 mg | INTRAVENOUS | Status: AC
  Administered 2021-08-08 – 2021-08-18 (×11): 1250 mg via INTRAVENOUS
  Filled 2021-08-07 (×12): qty 250

## 2021-08-07 MED ORDER — GADOBUTROL 1 MMOL/ML IV SOLN
5.0000 mL | Freq: Once | INTRAVENOUS | Status: AC | PRN
  Administered 2021-08-07: 5 mL via INTRAVENOUS

## 2021-08-07 MED ORDER — IOHEXOL 350 MG/ML SOLN
100.0000 mL | Freq: Once | INTRAVENOUS | Status: AC | PRN
  Administered 2021-08-07: 100 mL via INTRAVENOUS

## 2021-08-07 NOTE — Progress Notes (Signed)
Mobility Specialist Progress Note:   08/07/21 1030  Mobility  Activity Ambulated in hall  Level of Assistance Independent  Assistive Device None  Distance Ambulated (ft) 1650 ft  Mobility Ambulated independently in hallway  Mobility Response Tolerated well  Mobility performed by Mobility specialist  $Mobility charge 1 Mobility   Pt c/o "head hurting" at end of session. Otherwise asx.   Nelta Numbers Mobility Specialist  Phone [phone]

## 2021-08-07 NOTE — Progress Notes (Signed)
  Echocardiogram 2D Echocardiogram has been performed.  Fidel Levy 08/07/2021, 3:44 PM

## 2021-08-07 NOTE — Progress Notes (Addendum)
STROKE TEAM PROGRESS NOTE   ATTENDING NOTE: I reviewed above note and agree with the assessment and plan. Pt was seen and examined.   50 year old female with history of IV drug use, smoker, recent MRSA bacteremia, endocarditis consulted for headache and neck pain.  Per chart, patient had left shoulder abscess status post I&D on 06/13/2021 and culture showed MRSA.  She left AMA but readmitted on 06/24/2021 for shortness of breath and chest pain and found to have bilateral pulmonary septic emboli, and TEE showed tricuspid valve endocarditis, has been treated with vancomycin and cefepime.  PICC line placed for long-term antibiotics.    Neurology was consulted for persistent headache and neck pain.  MRI showed right occipital small subacute ICH.  Recent MRI signal and plain CT, the ICH likely subacute and at least 65 to 65 weeks old.  CT head and neck unremarkable, no large mycotic aneurysm seen.  EF > 75% 07/14/2021 and TEE on 07/03/2021 showed EF 55 to 60% and highly mobile echodense mass on the anterior leaflet of the TV measuring 1.35x0.582.  Creatinine 0.71, LDL 53 and A1c pending.  MRI C-spine showed septic arthritis C3-C5.  Thoracic MRI pending.  On exam, no family at bedside, patient awake alert, sitting in bed.  AOx3, no aphasia, follows simple commands.  Left lower quadrantanopsia, no gaze palsy, facial symmetric, bilateral upper and lower extremity strength, sensation and coordination intact.  Etiology with patient subacute ICH likely due to small mycotic aneurysm rupture in the setting of endocarditis. The ICH at least 75-54 weeks old, not likely the cause for her HA and neck pain, which more likely due to septic arthritis in the C-spine. Continue antibiotic treatment per ID.  Neurosurgery on board for cervical spine septic arthritis management.  BP goal normotensive.  Smoking and illicit drug cessation education provided.  For detailed assessment and plan, please refer to above as I have made changes  wherever appropriate.   Yvette Hawking, MD PhD Stroke Neurology 08/07/2021 3:07 PM  Neurology will sign off. Please call with questions. Pt will follow up with stroke clinic NP at [facility] in about 4 weeks after discharge. Thanks for the consult.    INTERVAL HISTORY No one is at the  bedside at time of this exam. CTA head and neck obtained this morning.   Vitals:   08/06/21 1535 08/06/21 2027 08/07/21 0508 08/07/21 0749  BP: 121/75 (!) 105/51 (!) 95/56 117/74  Pulse: 65 71 67 79  Resp: 16 17 18 17   Temp: 98.7 F (37.1 C) 98.4 F (36.9 C) 98.5 F (36.9 C) 98.3 F (36.8 C)  TempSrc: Oral Oral Oral Oral  SpO2: 99% 100% 100% 98%  Weight:      Height:       CBC:  Recent Labs  Lab 08/06/21 0251  WBC 5.2  HGB 10.6*  HCT 32.9*  MCV 89.9  PLT 169   Basic Metabolic Panel:  Recent Labs  Lab 08/06/21 0251 08/07/21 0454  NA 138  --   K 4.3  --   CL 102  --   CO2 29  --   GLUCOSE 105*  --   BUN 22*  --   CREATININE 0.72 0.71  CALCIUM 8.9  --     Lipid Panel: No results for input(s): CHOL, TRIG, HDL, CHOLHDL, VLDL, LDLCALC in the last 168 hours.  HgbA1c: No results for input(s): HGBA1C in the last 168 hours. Urine Drug Screen: No results for input(s): LABOPIA, COCAINSCRNUR, [place], AMPHETMU, THCU, LABBARB  in the last 168 hours.  Alcohol Level No results for input(s): ETH in the last 168 hours.  IMAGING past 24 hours MR BRAIN W WO CONTRAST  Result Date: 08/06/2021 CLINICAL DATA:  Headache, history of IV drug use EXAM: MRI HEAD WITHOUT AND WITH CONTRAST TECHNIQUE: Multiplanar, multiecho pulse sequences of the brain and surrounding structures were obtained without and with intravenous contrast. CONTRAST:  71mL GADAVIST GADOBUTROL 1 MMOL/ML IV SOLN COMPARISON:  None. FINDINGS: Brain: There is a 2.4 cm AP x 2.1 cm TV x 1.8 cm cc T1 and T2 hyperintense collection in the right occipital lobe. There is peripheral T2* hypointensity and patchy diffusion restriction. There is no  appreciable enhancement either centrally or peripherally. There is mild surrounding FLAIR signal abnormality without significant regional midline shift. There is no other parenchymal signal abnormality. Parenchymal volume is normal. The ventricles are normal in size. There is no abnormal enhancement.  There is no midline shift. Vascular: Normal flow voids. Skull and upper cervical spine: Normal marrow signal. Sinuses/Orbits: The paranasal sinuses are clear. The globes and orbits are unremarkable. Other: None. IMPRESSION: 1. 2.4 cm T1 hyperintense collection in the right occipital lobe is most consistent with an intraparenchymal hematoma, subacute in chronicity by signal characteristics. There is mild perilesional edema without significant midline shift. 2. No other parenchymal signal abnormality; no abnormal enhancement. These results were called by telephone at the time of interpretation on 08/06/2021 at 2:38 pm to provider Erin Hearing , who verbally acknowledged these results. Electronically Signed   By: Valetta Mole M.D.   On: 08/06/2021 14:40   MR CERVICAL SPINE W WO CONTRAST  Result Date: 08/06/2021 CLINICAL DATA:  Neck pain, acute, infection suspected. History of IV drug use. EXAM: MRI CERVICAL SPINE WITHOUT AND WITH CONTRAST TECHNIQUE: Multiplanar and multiecho pulse sequences of the cervical spine, to include the craniocervical junction and cervicothoracic junction, were obtained without and with intravenous contrast. CONTRAST:  33mL GADAVIST GADOBUTROL 1 MMOL/ML IV SOLN COMPARISON:  None. FINDINGS: Alignment: Straightening of the normal cervical lordosis. No listhesis. Vertebrae: Marrow and periarticular soft tissue edema and enhancement involving the left-sided joint at C3-4 greater than C4-5. Mild dorsal epidural enhancement/inflammation from C3-4 to C6-7 without abscess. No evidence of discitis or fracture. Cord: Normal cord signal and morphology. Posterior Fossa, vertebral arteries, paraspinal  tissues: The brain is more fully evaluated on the separate dedicated brain MRI. Preserved vertebral artery flow voids. No paraspinal fluid collection. Disc levels: C2-3: Negative. C3-4: Inflammation about the left facet joint extending into the left neural foramen. No spinal stenosis. C4-5: Inflammation about the left facet joint extending into the left neural foramen. No spinal stenosis. C5-6: Disc bulging, uncovertebral spurring, and mild facet arthrosis result in mild spinal stenosis without significant neural foraminal stenosis. C6-7: Negative. C7-T1: Negative. IMPRESSION: 1. Inflammation about the left-sided facet joints at C3-4 and C4-5 concerning for septic arthritis. Mild dorsal epidural inflammation. No abscess. 2. Mild spinal stenosis at C5-6 due to spondylosis. These results will be called to the ordering clinician or representative by the Radiologist Assistant, and communication documented in the PACS or [employer]. Electronically Signed   By: Logan Bores M.D.   On: 08/06/2021 15:11    PHYSICAL EXAM  Neuro: Mental Status: Patient is awake, alert, oriented to person, place, month, year, and situation.  Patient is able to give a clear and coherent history.  No signs of aphasia or neglect  Cranial Nerves: II:  Pupils are equal, round, and reactive to light.  She has left lower quadrant homonymous visual field cut.  III,IV, VI: EOMI without ptosis or diploplia.  V: Facial sensation is symmetric to temperature VII: Facial movement is symmetric.  VIII: hearing is intact to voice X: Palate elevates symmetrically XI: Shoulder shrug is symmetric. XII: tongue is midline without atrophy or fasciculations.  Motor: Tone is normal. Bulk is normal. 5/5 strength was present in all four extremities.   Sensory: Sensation is symmetric to light touch and temperature in the arms and legs.  Deep Tendon Reflexes: BUE reflexes 1+, BLE reflexes 3+ with bilateral sustained ankle  clonus Cerebellar: FNF and HKS are intact bilaterally   ASSESSMENT/PLAN Ms. Chade Pitner is a 50 y.o. 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   Patient presented to [facility] on 10/05 for worsening of left shoulder swelling and pain.  Was found to have abscess of left shoulder and left calf,  I&D was done on 10/06 and culture showed MRSA.[name]  Patient however signed out [place] after the surgery.    On 10/16, patient came back to [facility] complaining about new onset of chest pains and shortness of breath, CT chest showed multiple bilateral septic emboli, echocardiogram showed new onset of tricuspid vegetation compatible with endocarditis.  Blood culture showed again MRSA.  Patient was started on vancomycin and cefepime since. During hospital stay, patient was thrombocytopenic, DIC was ruled out.     Patient was admitted to [facility], and started on IV vancomycin, TEE with tricuspid leaflet vegetation. Given history of IVDU with picc line in place, patient remains inpatient.    She has complained of persistent headache and neck pain, unresponsive to NSAIDS and SMRS, and MRI head and neck were obtained today, revealing by appearance a subacute right ocipital lobe IPH without shift. No enhancement seen on my review to suggest abscess. MRI cervical spine shows probable septic arthritis at C3-4 and C4-5  ICH : subacute right  occipital lobe ICH likely secondary to mycotic aneurysm rupture    CTA head & neck    MRI BRAIN:   2.4 cm T1 hyperintense collection in the right occipital lobe is most consistent with an intraparenchymal hematoma, subacute in chronicity by signal characteristics. There is mild perilesional edema without significant midline shift. 2. No  other parenchymal signal abnormality; no abnormal enhancement.  2D Echo 07/14/2021 EF >75%, anterior TV vegetation on  trifcuspd valve.    LDL 53 HgbA1c pending. VTE prophylaxis -  scd No antithrombotic prior to admission, now on No antithrombotic.  Therapy recommendations:  home Disposition:  home NO further inpatient stroke workup indicated and we will sign off at this time.   Other Stroke Risk Factors  Endocarditis - MRSA endocarditis   Cigarette smoker advised to stop smoking  Substance abuse   Other Active Problems   Hospital day # 39     To contact Stroke Continuity provider, please refer to [email or web address] After hours, contact General Neurology

## 2021-08-07 NOTE — Progress Notes (Signed)
Pharmacy Antibiotic Note  Yvette Gaines is a 50 y.o. 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 Patient was previously not clearing her blood cultures and was put on combination therapy with daptomycin and ceftaroline for 7 days ending 11/3. Blood cultures from 10/29 with no growth (final). ECHO on 11/6 showed small residual vegetations on the anterior TV leaflet. Pharmacy was consulted for vancomycin dosing.  Renal function stable  Vancomycin levels collected 11/28 and 11/29 Vanc peak 32 mcg/ml Vanc trough 8  mcg/ml  Calculated AUC 429 (at low end of goal range)   Plan: Increase vancomycin IV 1250 mg Q24H  Monitor renal function, weekly vancomycin levels (next levels 12/5) Plan to treat for 6 weeks weeks (10/29 - 12/10) or longer given new C-spine pathology - see 11/29 ID note  Height: 5\' 2"  (157.5 cm) Weight: 50.5 kg (111 lb 6.4 oz) IBW/kg (Calculated) : 50.1 kg  Temp (24hrs), Avg:98.3 F (36.8 C), Min:98 F (36.7 C), Max:98.5 F (36.9 C)  Recent Labs  Lab 08/06/21 0251 08/06/21 2206 08/07/21 0454 08/07/21 1918  WBC 5.2  --   --   --   CREATININE 0.72  --  0.71  --   VANCOTROUGH  --   --   --  8*  VANCOPEAK  --  32  --   --      Estimated Creatinine Clearance: 66.5 mL/min (by C-G formula based on SCr of 0.71 mg/dL).    No Known Allergies  Anti-inflectives this admission: Valacyclovir 10/23 >> 10/29 Vancomycin 10/21 >> 10/28, restarted 11/4 >>(12/10) Daptomycin 10/28 >> 11/3 Ceftaroline 10/28 >> 11/3  Vancomycin levels  11/8 VP 33; 11/9 VT 7, AUC 443 > continue  11/15 VP 38: 11/15 VT 8 (listed as pk in epic), AUC 480 >continue  11/28 VP 32; VT 7 - increase dose   Microbiology results: 10/21 BCx: MRSA 3/3 10/22 BCx >> MRSA 2/2 10/24 BCx >> 1/3 MRSA  10/25 Bcx >> 1/4 MRSA 10/27 Bcx >> MRSA 4/4 10/29 BCx >> NG/final 10/21 Hepatitis A, Hepatitis B, HIV serologies: negative 10/21 Hepatitis C antibody: reactive 10/27 HCV quant: >50 IU/ml  Thank  you for allowing pharmacy to be a part of this patient's care.  Alanda Slim, PharmD, [facility] Clinical Pharmacist Please see AMION for all Pharmacists' Contact Phone Numbers 08/07/2021, 10:17 PM

## 2021-08-07 NOTE — TOC CAGE-AID Note (Signed)
Transition of Care [facility]) - CAGE-AID Screening   Patient Details  Name: Yvette Gaines MRN: [id number] Date of Birth: 1971/04/06  Transition of Care [facility]) CM/SW Contact:    Humbert Morozov C Tarpley-Carter, [place] Phone Number: 08/07/2021, 8:35 AM   Clinical Narrative: Pt participated in [place].  Pt stated she does not use substance or ETOH.  Pt smokes cigarettes. Pt was not offered resources, due to no usage of substance or ETOH.   CSW will provide pt with resources for possible future use.  Blaire Palomino Tarpley-Carter, MSW, LCSW-A Pronouns:  She/Her/Hers [facility] Clinical Social Worker Direct Number:  [phone] [email or web address]    CAGE-AID Screening:    Have You Ever Felt You Ought to Cut Down on Your Drinking or Drug Use?: No Have People Annoyed You By [employer] Your Drinking Or Drug Use?: No Have You Felt Bad Or Guilty About Your Drinking Or Drug Use?: No Have You Ever Had a Drink or Used Drugs First Thing In The Morning to Steady Your Nerves or to Get Rid of a Hangover?: No CAGE-AID Score: 0  Substance Abuse Education Offered: No  Substance abuse interventions: Scientist, clinical (histocompatibility and immunogenetics)

## 2021-08-07 NOTE — Progress Notes (Signed)
Subjective: Continued problems with neck pain and pain in particular if she tries to look down to read   Antibiotics:  Anti-infectives (From admission, onward)    Start     Dose/Rate Route Frequency Ordered Stop   07/14/21 2000  vancomycin (VANCOCIN) IVPB 1000 mg/200 mL premix        1,000 mg 200 mL/hr over 60 Minutes Intravenous Every 24 hours 07/12/21 1400     07/13/21 2000  vancomycin (VANCOREADY) IVPB 1250 mg/250 mL        1,250 mg 166.7 mL/hr over 90 Minutes Intravenous  Once 07/12/21 1400 07/13/21 2326   07/06/21 1400  ceftaroline (TEFLARO) 600 mg in sodium chloride 0.9 % 100 mL IVPB        600 mg 100 mL/hr over 60 Minutes Intravenous Every 8 hours 07/06/21 1033 07/12/21 2229   07/06/21 1330  DAPTOmycin (CUBICIN) 500 mg in sodium chloride 0.9 % IVPB        500 mg 120 mL/hr over 30 Minutes Intravenous Daily 07/06/21 1233 07/12/21 2022   07/01/21 2200  vancomycin (VANCOREADY) IVPB 750 mg/150 mL  Status:  Discontinued        750 mg 150 mL/hr over 60 Minutes Intravenous Every 12 hours 07/01/21 1314 07/06/21 1033   07/01/21 1400  valACYclovir (VALTREX) tablet 1,000 mg        1,000 mg Oral 2 times daily 07/01/21 1306 07/08/21 0959   06/30/21 0200  vancomycin (VANCOREADY) IVPB 750 mg/150 mL  Status:  Discontinued        750 mg 150 mL/hr over 60 Minutes Intravenous Every 12 hours 06/29/21 1253 06/29/21 1254   06/29/21 1700  vancomycin (VANCOREADY) IVPB 750 mg/150 mL  Status:  Discontinued        750 mg 150 mL/hr over 60 Minutes Intravenous Every 12 hours 06/29/21 1308 07/01/21 1301   06/29/21 1615  rifampin (RIFADIN) capsule 300 mg  Status:  Discontinued        300 mg Oral Daily 06/29/21 1606 06/29/21 1607   06/29/21 1130  vancomycin (VANCOREADY) IVPB 1250 mg/250 mL  Status:  Discontinued        1,250 mg 166.7 mL/hr over 90 Minutes Intravenous  Once 06/29/21 1124 06/29/21 1254   06/29/21 1130  ceFEPIme (MAXIPIME) 2 g in sodium chloride 0.9 % 100 mL IVPB  Status:   Discontinued        2 g 200 mL/hr over 30 Minutes Intravenous  Once 06/29/21 1124 06/29/21 1251       Medications: Scheduled Meds:  buprenorphine-naloxone  1.5 tablet Sublingual Daily   busPIRone  7.5 mg Oral TID   cetirizine HCl  5 mg Oral Daily   Chlorhexidine Gluconate Cloth  6 each Topical Daily   citalopram  20 mg Oral Daily   diclofenac Sodium  4 g Topical QID   famotidine  20 mg Oral Daily   feeding supplement  237 mL Oral TID BM   methocarbamol  500 mg Oral TID   multivitamin with minerals  1 tablet Oral Daily   nicotine  21 mg Transdermal Daily   sodium chloride flush  10-40 mL Intracatheter Q12H   traZODone  150 mg Oral QHS   Continuous Infusions:  vancomycin 1,000 mg (08/06/21 2001)   PRN Meds:.acetaminophen **OR** acetaminophen, albuterol, ALPRAZolam, hydrocortisone cream, influenza vac split quadrivalent PF, ondansetron **OR** ondansetron (ZOFRAN) IV, sodium chloride flush    Objective: Weight change:   Intake/Output Summary (Last 24 hours) at  08/07/2021 1509 Last data filed at 08/06/2021 2100 Gross per 24 hour  Intake 270 ml  Output --  Net 270 ml    Blood pressure 117/74, pulse 79, temperature 98.3 F (36.8 C), temperature source Oral, resp. rate 17, height 5\' 2"  (1.575 m), weight 50.5 kg, SpO2 98 %. Temp:  [98.3 F (36.8 C)-98.7 F (37.1 C)] 98.3 F (36.8 C) (11/29 0749) Pulse Rate:  [65-79] 79 (11/29 0749) Resp:  [16-18] 17 (11/29 0749) BP: (95-121)/(51-75) 117/74 (11/29 0749) SpO2:  [98 %-100 %] 98 % (11/29 0749)  Physical Exam: Physical Exam Constitutional:      General: She is not in acute distress.    Appearance: She is well-developed. She is not diaphoretic.  HENT:     Head: Normocephalic and atraumatic.     Right Ear: External ear normal.     Left Ear: External ear normal.     Mouth/Throat:     Pharynx: No oropharyngeal exudate.  Eyes:     General: No scleral icterus.    Conjunctiva/sclera: Conjunctivae normal.     Pupils:  Pupils are equal, round, and reactive to light.  Cardiovascular:     Rate and Rhythm: Normal rate and regular rhythm.     Heart sounds: Normal heart sounds.    No gallop.  Pulmonary:     Effort: Pulmonary effort is normal. No respiratory distress.     Breath sounds: No wheezing.  Abdominal:     General: Bowel sounds are normal. There is no distension.     Palpations: Abdomen is soft.     Tenderness: There is no abdominal tenderness. There is no rebound.  Musculoskeletal:        General: No tenderness. Normal range of motion.  Lymphadenopathy:     Cervical: No cervical adenopathy.  Skin:    General: Skin is warm and dry.     Coloration: Skin is not pale.     Findings: No erythema or rash.  Neurological:     Mental Status: She is alert and oriented to person, place, and time.     Motor: No abnormal muscle tone.     Coordination: Coordination normal.  Psychiatric:        Mood and Affect: Mood normal.        Behavior: Behavior normal.        Thought Content: Thought content normal.        Judgment: Judgment normal.    She does have discomfort with trying to flex neck to read   CBC:    BMET Recent Labs    08/06/21 0251 08/07/21 0454  NA 138  --   K 4.3  --   CL 102  --   CO2 29  --   GLUCOSE 105*  --   BUN 22*  --   CREATININE 0.72 0.71  CALCIUM 8.9  --       Liver Panel  Recent Labs    08/06/21 0251  PROT 6.3*  ALBUMIN 3.1*  AST 17  ALT 15  ALKPHOS 76  BILITOT 0.6        Sedimentation Rate No results for input(s): ESRSEDRATE in the last 72 hours. C-Reactive Protein No results for input(s): CRP in the last 72 hours.  Micro Results: No results found for this or any previous visit (from the past 720 hour(s)).   Studies/Results: CT ANGIO HEAD NECK W WO CM  Result Date: 08/07/2021 CLINICAL DATA:  50 year old female with IV drug use, MRSA bacteremia, endocarditis.  Right occipital lobe hemorrhage on MRI. Query mycotic aneurysm. EXAM: CT  ANGIOGRAPHY HEAD AND NECK TECHNIQUE: Multidetector CT imaging of the head and neck was performed using the standard protocol during bolus administration of intravenous contrast. Multiplanar CT image reconstructions and MIPs were obtained to evaluate the vascular anatomy. Carotid stenosis measurements (when applicable) are obtained utilizing NASCET criteria, using the distal internal carotid diameter as the denominator. CONTRAST:  191mL OMNIPAQUE IOHEXOL 350 MG/ML SOLN COMPARISON:  Brain and cervical spine MRI 08/06/2021. FINDINGS: CT HEAD Brain: In the abnormal right occipital pole there is a roughly 2 cm rounded area of hypodensity corresponding to the hemosiderin and mixed signal on MRI (series 5, image 17) with an adjacent linear area of chronic appearing encephalomalacia. No hyperdense hemorrhage is identified. No significant regional mass effect. Gray-white matter differentiation elsewhere is within normal limits. No ventriculomegaly. No other encephalomalacia identified. Calvarium and skull base: Negative. Paranasal sinuses: Visualized paranasal sinuses and mastoids are stable and well aerated. Orbits: Visualized orbits and scalp soft tissues are within normal limits. CTA NECK Skeleton: Carious and absent dentition. Left side C4-C5 facet hypertrophy but also subchondral irregularity and sclerosis. See recent cervical spine MRI. Upper chest: Centrilobular emphysema with scattered nodular, mostly peripheral and patchy areas of lung opacity. No cavitating nodules. No pleural fluid. Reactive appearing mediastinal lymph nodes. Visible central pulmonary arteries patent. There is a right side PICC line in place. Other neck: Negative soft tissue spaces of the neck. Bilateral cervical lymph nodes are somewhat numerous but not enlarged. Aortic arch: 3 vessel arch configuration with no arch atherosclerosis. Right carotid system: Minor atherosclerosis at the right ECA origin. Negative cervical right ICA. Left carotid  system: Similar mild plaque at the anterior carotid bifurcation, ECA origin. Negative cervical left ICA. Vertebral arteries: Negative proximal right subclavian artery and right vertebral artery origin. Negative right vertebral artery to the skull base. Negative proximal left subclavian artery with a somewhat early origin of the left vertebral artery, normal variant. Left vertebral also has a slightly late entry into the cervical transverse foramen, but is otherwise patent and normal to the skull base. CTA HEAD Posterior circulation: Negative distal vertebral arteries, vertebrobasilar junction. Patent PICA origins. Patent basilar artery without stenosis. Dominant appearing left AICA origin is patent. Patent SCA and left PCA origins. Fetal type right PCA origin. Left posterior communicating artery diminutive or absent. There is mild irregularity of the distal left PCA branches as seen on series 16, image 20. Right PCA P3 branches are attenuated distally. No right PCA or occipital aneurysm identified. No CTA spot sign or abnormal vascularity associated with the occipital pole. Anterior circulation: Both ICA siphons are tortuous but patent without plaque or stenosis. Normal right posterior communicating artery origin. Patent carotid termini. Normal MCA and ACA origins. Normal anterior communicating artery and bilateral ACA branches. Left MCA M1 segment and trifurcation are patent without stenosis. Right MCA M1 segment and trifurcation are patent without stenosis. Bilateral MCA branches are within normal limits. Venous sinuses: Patent, within normal limits. Anatomic variants: Fetal right PCA origin. Early origin of the left vertebral artery. Review of the MIP images confirms the above findings IMPRESSION: 1. Negative for intracranial aneurysm, and the right occipital blood products are hypodense by CT suggesting a subacute bleed. No associated CTA spot sign or abnormal vascularity. There is mild irregularity of the  distal bilateral PCA branches. 2. No new intracranial abnormality. And otherwise negative CTA head and neck, with minimal cervical carotid atherosclerosis. 3. Multifocal abnormal pulmonary opacity  could reflect a combination of disseminated infection and scarring. Underlying Emphysema (ICD10-J43.9). Electronically Signed   By: Genevie Ann M.D.   On: 08/07/2021 09:17   MR BRAIN W WO CONTRAST  Result Date: 08/06/2021 CLINICAL DATA:  Headache, history of IV drug use EXAM: MRI HEAD WITHOUT AND WITH CONTRAST TECHNIQUE: Multiplanar, multiecho pulse sequences of the brain and surrounding structures were obtained without and with intravenous contrast. CONTRAST:  50mL GADAVIST GADOBUTROL 1 MMOL/ML IV SOLN COMPARISON:  None. FINDINGS: Brain: There is a 2.4 cm AP x 2.1 cm TV x 1.8 cm cc T1 and T2 hyperintense collection in the right occipital lobe. There is peripheral T2* hypointensity and patchy diffusion restriction. There is no appreciable enhancement either centrally or peripherally. There is mild surrounding FLAIR signal abnormality without significant regional midline shift. There is no other parenchymal signal abnormality. Parenchymal volume is normal. The ventricles are normal in size. There is no abnormal enhancement.  There is no midline shift. Vascular: Normal flow voids. Skull and upper cervical spine: Normal marrow signal. Sinuses/Orbits: The paranasal sinuses are clear. The globes and orbits are unremarkable. Other: None. IMPRESSION: 1. 2.4 cm T1 hyperintense collection in the right occipital lobe is most consistent with an intraparenchymal hematoma, subacute in chronicity by signal characteristics. There is mild perilesional edema without significant midline shift. 2. No other parenchymal signal abnormality; no abnormal enhancement. These results were called by telephone at the time of interpretation on 08/06/2021 at 2:38 pm to provider Erin Hearing , who verbally acknowledged these results. Electronically  Signed   By: Valetta Mole M.D.   On: 08/06/2021 14:40   MR CERVICAL SPINE W WO CONTRAST  Result Date: 08/06/2021 CLINICAL DATA:  Neck pain, acute, infection suspected. History of IV drug use. EXAM: MRI CERVICAL SPINE WITHOUT AND WITH CONTRAST TECHNIQUE: Multiplanar and multiecho pulse sequences of the cervical spine, to include the craniocervical junction and cervicothoracic junction, were obtained without and with intravenous contrast. CONTRAST:  80mL GADAVIST GADOBUTROL 1 MMOL/ML IV SOLN COMPARISON:  None. FINDINGS: Alignment: Straightening of the normal cervical lordosis. No listhesis. Vertebrae: Marrow and periarticular soft tissue edema and enhancement involving the left-sided joint at C3-4 greater than C4-5. Mild dorsal epidural enhancement/inflammation from C3-4 to C6-7 without abscess. No evidence of discitis or fracture. Cord: Normal cord signal and morphology. Posterior Fossa, vertebral arteries, paraspinal tissues: The brain is more fully evaluated on the separate dedicated brain MRI. Preserved vertebral artery flow voids. No paraspinal fluid collection. Disc levels: C2-3: Negative. C3-4: Inflammation about the left facet joint extending into the left neural foramen. No spinal stenosis. C4-5: Inflammation about the left facet joint extending into the left neural foramen. No spinal stenosis. C5-6: Disc bulging, uncovertebral spurring, and mild facet arthrosis result in mild spinal stenosis without significant neural foraminal stenosis. C6-7: Negative. C7-T1: Negative. IMPRESSION: 1. Inflammation about the left-sided facet joints at C3-4 and C4-5 concerning for septic arthritis. Mild dorsal epidural inflammation. No abscess. 2. Mild spinal stenosis at C5-6 due to spondylosis. These results will be called to the ordering clinician or representative by the Radiologist Assistant, and communication documented in the PACS or [employer]. Electronically Signed   By: Logan Bores M.D.   On: 08/06/2021  15:11      Assessment/Plan:  INTERVAL HISTORY: MRI of the cervical spine shows infection at that site MRI of the brain shows hematoma  Principal Problem:   MRSA bacteremia Active Problems:   Severe opioid use disorder (HCC)   Endocarditis   Septic  pulmonary embolism (HCC)   Chronic viral hepatitis C (HCC)   Shoulder abscess   Calf abscess   Odynophagia   Protein-calorie malnutrition, severe   Chest pain   IVDU (intravenous drug user)   Intraparenchymal hematoma of brain   Cerebral thrombosis with cerebral infarction   Cerebral embolism with cerebral infarction   Subarachnoid hemorrhage   Intracerebral hemorrhage    Yanique Mulvihill is a 49 y.o. 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  I had not realized that she had been suffering from neck pain from several weeks.  Yesterday she had an MRI of the cervical spine performed which shows inflammation and infection at the facet joints at C3-4 C4-5 with some dorsal epidural inflammation and mild spinal stenosis from C5-C6.  MRI of the brain showed a 2.4 cm collection of the right occipital lobe consistent with intraparenchymal hematoma thought to be subacute.  CT angiogram of the brain fortunately does not show any evidence of intracranial aneurysm mycotic or otherwise.  He does show some right-sided occipital blood products consistent with subacute bleed in that area.  MRI of the thoracic spine is pending  Repeat echocardiogram has been performed but not yet read   #1 New C spine pathology consistent with likely MRSA infection of this site:  This will prompt me to treat her longer with antibiotics though will likely extend her current IV antibiotics with bioavailable doxycycline  #2 Hematoma in CNS: may have been transformation of a septic embolism though there are no other lesions.  She never was found to have a left-sided lesion and did not have a PFO  detected.  This may have been present much earlier in her course because she has had no CNS imaging during this stay.  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 May ghave had left sided endocarditis with embolism to brain  Continue vancomycin as inpatient with last day of therapy being August 18, 2021 and I would follow this with oral doxycline though I am open to keeping her here longer for IV therapy  Weight MRI of T-spine #2 history of hepatitis C seropositivity hepatitis C was not detected on quant RNA.  #3 IVDU: she is really doing so well on suboxone and has appt after DC. Just needs meds to bridge her to that appt..  I spent 42 minutes with the patient including  face to face counseling of the patient guarding findings of C-spine infection hematoma in brain, treatment plan for her known MRSA bacteremia tricuspid valve endocarditis with septic emboli, now C-spine infection and potential left-sided septic embolization, personally reviewing MRI have her C-spine and brain as well as CT angiogram of head along with , review of medical records in preparation for the visit and during the visit and in coordination of her care with primary team and Neurosurgery.  LOS: 7 days   Alcide Evener 08/07/2021, 3:09 PM

## 2021-08-07 NOTE — Progress Notes (Signed)
Nutrition Follow-up  DOCUMENTATION CODES:   Severe malnutrition in context of chronic illness  INTERVENTION:   Continue Multivitamin w/ minerals daily Continue Ensure Enlive po TID, each supplement provides 350 kcal and 20 grams of protein Encourage good PO intake   NUTRITION DIAGNOSIS:   Severe Malnutrition related to chronic illness, cancer and cancer related treatments as evidenced by severe fat depletion, severe muscle depletion. - Ongoing  GOAL:   Patient will meet greater than or equal to 90% of their needs - Progressing   MONITOR:   PO intake, Supplement acceptance, Weight trends  REASON FOR ASSESSMENT:   Malnutrition Screening Tool    ASSESSMENT:   50 yo female with a PMH of mild intermittent asthma, cancer, recently diagnosed MRSA on tricuspid valve with bilateral pulmonary emboli on 06/24/2021 at [facility] she left AMA 10/21, and came to [facility] seeking treatment.  Primary MD, consulted neurology due to ongoing headache and neck pain in patient.   Pt reports that she is doing "ok", she is receiving her snacks but they are a day late. Pt stated that the unit is now stocking orange sherbet for her. Per EMR, pt intake includes: 11/25: Lunch 100% 11/26: Breakfast 75%, Lunch 100%, Dinner 100% 11/27: Breakfast 100%, Lunch 100%, Dinner 75%  Pt with no other questions or concerns at this time.  Continued inpatient due to needing IV antibiotics until 12/10.  Medications reviewed and include: Pepcid, Robaxin, MVI, IV antibiotics Labs reviewed.  Diet Order:   Diet Order             Diet regular Room service appropriate? Yes; Fluid consistency: Thin  Diet effective now                   EDUCATION NEEDS:   No education needs have been identified at this time  Skin:  Skin Assessment: Skin Integrity Issues: Skin Integrity Issues:: Other (Comment) Other: Skin tear - L leg  Last BM:  08/06/2021  Height:   Ht Readings from Last 1  Encounters:  07/03/21 5\' 2"  (1.575 m)    Weight:   Wt Readings from Last 1 Encounters:  07/31/21 50.5 kg    Ideal Body Weight:  50 kg  BMI:  Body mass index is 20.38 kg/m.  Estimated Nutritional Needs:   Kcal:  1700-1900  Protein:  85-100 grams  Fluid:  > 1.7 L    Zeriah Baysinger BS, PLDN Clinical Dietitian See [facility] for contact information.

## 2021-08-07 NOTE — Progress Notes (Signed)
[facility] ([facility]) HOSPITALISTS PROGRESS NOTE  Yvette Gaines CZY:[id number] DOB: 1971/01/30 DOA: 06/29/2021 PCP: [name], DO  Status: Remains inpatient appropriate because:  Unsafe to discharge patient with history of IVDU with PICC line in place for outpatient IV therapy Patient requires a total of 6 weeks of IV antibiotics with blood cultures have as of 11/3 and last dose due 12/10  Barriers to discharge: Social: IVDU history so unable to discharge with a PICC line in place to complete antibiotic therapies in the outpatient environment/home setting  Clinical: Final blood cultures from 10/29 returned negative as of 11/3 Has done well on Suboxone therefore we will continue this medication until a bed offer obtained and an actual discharge date confirmed before transitioning over to short acting scheduled Oxy IR  Level of care:  Med-Surg   Code Status: Full Family Communication: Patient DVT prophylaxis: Lovenox COVID vaccination status: Unknown  HPI: 50 y.o. 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   Patient presented to [facility] on 10/05 for worsening of left shoulder swelling and pain.  Was found to have abscess of left shoulder and left calf,  I&D was done on 10/06 and culture showed MRSA.[name]  Patient however signed out [place] after the surgery. On 10/16, patient came back to [facility] complaining about new onset of chest pains and shortness of breath, CT chest showed multiple bilateral septic emboli, echocardiogram showed new onset of tricuspid vegetation compatible with endocarditis.  Blood culture showed again MRSA.  Patient was started on vancomycin and cefepime since. During hospital stay, patient was also found to develop acute thrombocytopenia, DIC was ruled out.   Patient was admitted to [facility], and started on IV vancomycin, TEE with tricuspid leaflet vegetation.   Subjective: Patient awake and alert.  States still has headache.  Had multiple questions regarding rationale for current work-up.  States she knew she had been having some visual problems since admission to the hospital but was not aware of the degree of it until was tested by the neuro team yesterday and was noted to have an isolated left field visual cut she also states that when the neuro team evaluated her she was found to be hyperreflexic in her lower extremities.  Objective: Vitals:   08/07/21 0508 08/07/21 0749  BP: (!) 95/56 117/74  Pulse: 67 79  Resp: 18 17  Temp: 98.5 F (36.9 C) 98.3 F (36.8 C)  SpO2: 100% 98%    Intake/Output Summary (Last 24 hours) at 08/07/2021 0835 Last data filed at 08/06/2021 2100 Gross per 24 hour  Intake 270 ml  Output --  Net 270 ml       Filed Weights   07/29/21 0500 07/31/21 0521 07/31/21 1500  Weight: 48.9 kg 47.5 kg 50.5 kg    Exam:  Constitutional: NAD, alert Respiratory: Lung sounds are CTA, room air, normal respiratory effort-pulse oximetry 99% Cardiovascular: S1-S2, pulse regular, normotensive, no peripheral edema Abdomen: no tenderness or distention, soft. Bowel sounds positive.  LBM 11/28 Musculoskeletal: Tender bilateral cervical neck at base of spine without any redness, palpable masses or radiculopathy/neurological signs reproduced Neurologic: CN 2-12 grossly intact-neurological team documented left visual field cut. Sensation intact, Strength 5/5 x all 4 extremities.  Psychiatric: Normal judgment and insight. Alert and oriented x 3.  Pleasant affect   Assessment/Plan: Acute problems: MRSA  bacteremia/MRSA TV endocarditis with moderate TVR/septic emboli/recent right shoulder and right calf abscess /status post I&D 10/6 (at [facility])  -TEE: tricuspid valve endocarditis.  CT surgery: not a candidate for  angio VAC due to small size of vegetation -Narrowed to vancomycin 11/5-LD due 12/10 -Given recent clinical findings concerning for septic emboli ID has recommended repeat echocardiogram  Pleuritic chest pain Resolved -11/5 echocardiogram: Small residual vegetation noted on the anterior TV leaflet. -CTA chest : Continued evolution of septic pulmonary emboli now globally increased surrounding consolidated opacifications which likely explains recent pleuritic chest discomfort  RLQ hematoma -With decreased pain  Headache and sinus congestion/intraparenchymal hematoma likely secondary to septic emboli -Continue Zyrtec and ibuprofen prn -11/28 MRI brain consistent with small IP hematoma -CTA head and neck ordered by ID demonstrates subacute bleed without evidence of infectious etiology  Cervical neck discomfort -Continue K pad along with Robaxin and Voltaren gel -MRI cervical spine reveals left upper C-spine septic arthritis  Hyperreflexive LEs/myelopathy Appreciate neurosurgical assistance MRI thoracic region pending  Intraparenchymal hematoma -Seen on MRI brain 11/28-d/w attending and appears c/w hemorraghic transformation of septic emboli -Neurology consulted -Lovenox and Advil dc'd -Considered Depakene 500 mg IV x 1 but on backorder-consider Decadron for HA but defer to neuro  IVDU with narcotics -Continue Suboxone.  After bed offer secured and actual discharge date confirmed plan transition to Oxy IR 10 mg Q 6 hours as recommended by Pharmacist and discontinue Suboxone for duration of SNF stay.  -Recommend resume Suboxone once discharged from SNF. Previously received Suboxone at a clinic in [facility]  Depression and insomnia Continue preadmission Celexa, BuSpar and trazodone at bedtime  BuSpar dose increased to 7.5 mg TID on 11/17  Thrombocytopenia:  Resolved.   Recent right shoulder and calf abscess -Source of bacteremia -Status post I&D on 10/06 at  [place] at outside hospital revealed no infectious involvement of AC joint    Severe protein calorie malnutrition Etiology: chronic illness and poor intake prior to admission secondary to IVDU Weight stable at 100.53 lbs  Mild intermittent asthma/current tobacco abuse -Currently asymptomatic -Continue prn bronchodilators and nicotine patch   Normocytic anemia with iron deficiency -Secondary to chronic malnutrition -Current hemoglobin 8.4 -Transfuse for hemoglobin </= 7.      +HCV AB -ID recommended treatment after discharge. -Also ID recommended HIV preexposure prophylaxis with either oral Truvada or long-acting aptitude injectable   Blood culture data: -Blood cultures positive for MRSA on 10/21, 10/22.   -Blood cultures 1/2 positive from 10/24. -Blood cultures 1/2 positive from 10/25. -Blood cultures 2 out of 2 positive from 10/27. -Blood cultures from 10/29 and finalized on 11/3   Data Reviewed: Basic Metabolic Panel: Recent Labs  Lab 08/06/21 0251 08/07/21 0454  NA 138  --   K 4.3  --   CL 102  --   CO2 29  --   GLUCOSE 105*  --   BUN 22*  --   CREATININE 0.72 0.71  CALCIUM 8.9  --      Scheduled Meds:  buprenorphine-naloxone  1.5 tablet Sublingual Daily   busPIRone  7.5 mg Oral TID   cetirizine HCl  5 mg Oral Daily   Chlorhexidine Gluconate Cloth  6 each Topical Daily   citalopram  20 mg Oral Daily   diclofenac Sodium  4 g Topical QID   famotidine  20 mg Oral Daily   feeding supplement  237 mL Oral TID BM   methocarbamol  500 mg Oral TID   multivitamin with  minerals  1 tablet Oral Daily   nicotine  21 mg Transdermal Daily   sodium chloride flush  10-40 mL Intracatheter Q12H   traZODone  150 mg Oral QHS   Continuous Infusions:  vancomycin 1,000 mg (08/06/21 2001)    Principal Problem:   MRSA bacteremia Active Problems:   Severe opioid use disorder ([place])   Endocarditis   Septic pulmonary embolism (H[facility])   Chronic viral hepatitis C  ([place])   Shoulder abscess   Calf abscess   Odynophagia   Protein-calorie malnutrition, severe   Chest pain   IVDU (intravenous drug user)   Intraparenchymal hematoma of brain   Cerebral thrombosis with cerebral infarction   Cerebral embolism with cerebral infarction   Subarachnoid hemorrhage   Intracerebral hemorrhage   Consultants: Infectious disease Cardiothoracic surgery Neurosurgery Neurology  Procedures: TTE TEE  Antibiotics: Vancomycin 10/21 through 10/28 Valacyclovir 10/23 through 10/29 Daptomycin 10/28 >> 11/05 Ceftaroline 10/28 >> 11/05 Vancomycin 11/05 >>   Time spent: 15 minutes    Erin Hearing ANP  [facility] 7 am - 330 pm/M-F for direct patient care and secure chat Please refer to Amion for contact info 39  days

## 2021-08-08 DIAGNOSIS — I330 Acute and subacute infective endocarditis: Secondary | ICD-10-CM | POA: Diagnosis not present

## 2021-08-08 DIAGNOSIS — I6310 Cerebral infarction due to embolism of unspecified precerebral artery: Secondary | ICD-10-CM | POA: Diagnosis not present

## 2021-08-08 DIAGNOSIS — M4642 Discitis, unspecified, cervical region: Secondary | ICD-10-CM

## 2021-08-08 DIAGNOSIS — L02419 Cutaneous abscess of limb, unspecified: Secondary | ICD-10-CM | POA: Diagnosis not present

## 2021-08-08 DIAGNOSIS — R7881 Bacteremia: Secondary | ICD-10-CM | POA: Diagnosis not present

## 2021-08-08 DIAGNOSIS — B9562 Methicillin resistant Staphylococcus aureus infection as the cause of diseases classified elsewhere: Secondary | ICD-10-CM | POA: Diagnosis not present

## 2021-08-08 LAB — T4, FREE: Free T4: 0.63 ng/dL (ref 0.61–1.12)

## 2021-08-08 LAB — TSH: TSH: 3.234 u[IU]/mL (ref 0.350–4.500)

## 2021-08-08 MED ORDER — ACETAMINOPHEN 500 MG PO TABS
1000.0000 mg | ORAL_TABLET | Freq: Four times a day (QID) | ORAL | Status: DC | PRN
  Administered 2021-08-08 – 2021-08-19 (×30): 1000 mg via ORAL
  Filled 2021-08-08 (×31): qty 2

## 2021-08-08 MED ORDER — BUPRENORPHINE HCL-NALOXONE HCL 8-2 MG SL SUBL
2.0000 | SUBLINGUAL_TABLET | Freq: Every day | SUBLINGUAL | Status: DC
  Administered 2021-08-08 – 2021-08-09 (×2): 2 via SUBLINGUAL
  Filled 2021-08-08: qty 2
  Filled 2021-08-08: qty 1

## 2021-08-08 MED ORDER — METHOCARBAMOL 750 MG PO TABS
750.0000 mg | ORAL_TABLET | Freq: Three times a day (TID) | ORAL | Status: DC
  Administered 2021-08-08 (×3): 750 mg via ORAL
  Filled 2021-08-08 (×3): qty 1

## 2021-08-08 NOTE — Progress Notes (Signed)
Mobility Specialist Criteria Algorithm Info.  08/08/21 1630  Mobility  Activity Ambulated in hall;Ambulated to bathroom  Range of Motion/Exercises Active;All extremities  Level of Assistance Independent  Assistive Device None  Distance Ambulated (ft) 1100 ft  Mobility Ambulated with assistance in hallway  Mobility Response Tolerated well  Mobility performed by Mobility specialist   Patient ambulated in hallway independently with steady gait. Tolerated ambulation well without complaint or incident. Was left lying supine in bed with all needs met.    08/08/2021 4:56 PM

## 2021-08-08 NOTE — Progress Notes (Signed)
Mobility Specialist Progress Note:   08/08/21 1100  Mobility  Activity Ambulated in hall  Level of Assistance Independent  Assistive Device None  Distance Ambulated (ft) 2200 ft  Mobility Ambulated independently in hallway  Mobility Response Tolerated well  Mobility performed by Mobility specialist  $Mobility charge 1 Mobility   Met pt ambulating in hallway. Pt asx during ambulation. No headache/neck pain.   Yvette Gaines Mobility Specialist  Phone [phone]  

## 2021-08-08 NOTE — Progress Notes (Signed)
Subjective:  No new acute complaints   Antibiotics:  Anti-infectives (From admission, onward)    Start     Dose/Rate Route Frequency Ordered Stop   08/08/21 2000  vancomycin (VANCOREADY) IVPB 1250 mg/250 mL        1,250 mg 166.7 mL/hr over 90 Minutes Intravenous Every 24 hours 08/07/21 2218     07/14/21 2000  vancomycin (VANCOCIN) IVPB 1000 mg/200 mL premix  Status:  Discontinued        1,000 mg 200 mL/hr over 60 Minutes Intravenous Every 24 hours 07/12/21 1400 08/07/21 2218   07/13/21 2000  vancomycin (VANCOREADY) IVPB 1250 mg/250 mL        1,250 mg 166.7 mL/hr over 90 Minutes Intravenous  Once 07/12/21 1400 07/13/21 2326   07/06/21 1400  ceftaroline (TEFLARO) 600 mg in sodium chloride 0.9 % 100 mL IVPB        600 mg 100 mL/hr over 60 Minutes Intravenous Every 8 hours 07/06/21 1033 07/12/21 2229   07/06/21 1330  DAPTOmycin (CUBICIN) 500 mg in sodium chloride 0.9 % IVPB        500 mg 120 mL/hr over 30 Minutes Intravenous Daily 07/06/21 1233 07/12/21 2022   07/01/21 2200  vancomycin (VANCOREADY) IVPB 750 mg/150 mL  Status:  Discontinued        750 mg 150 mL/hr over 60 Minutes Intravenous Every 12 hours 07/01/21 1314 07/06/21 1033   07/01/21 1400  valACYclovir (VALTREX) tablet 1,000 mg        1,000 mg Oral 2 times daily 07/01/21 1306 07/08/21 0959   06/30/21 0200  vancomycin (VANCOREADY) IVPB 750 mg/150 mL  Status:  Discontinued        750 mg 150 mL/hr over 60 Minutes Intravenous Every 12 hours 06/29/21 1253 06/29/21 1254   06/29/21 1700  vancomycin (VANCOREADY) IVPB 750 mg/150 mL  Status:  Discontinued        750 mg 150 mL/hr over 60 Minutes Intravenous Every 12 hours 06/29/21 1308 07/01/21 1301   06/29/21 1615  rifampin (RIFADIN) capsule 300 mg  Status:  Discontinued        300 mg Oral Daily 06/29/21 1606 06/29/21 1607   06/29/21 1130  vancomycin (VANCOREADY) IVPB 1250 mg/250 mL  Status:  Discontinued        1,250 mg 166.7 mL/hr over 90 Minutes Intravenous  Once  06/29/21 1124 06/29/21 1254   06/29/21 1130  ceFEPIme (MAXIPIME) 2 g in sodium chloride 0.9 % 100 mL IVPB  Status:  Discontinued        2 g 200 mL/hr over 30 Minutes Intravenous  Once 06/29/21 1124 06/29/21 1251       Medications: Scheduled Meds:  buprenorphine-naloxone  2 tablet Sublingual Daily   busPIRone  7.5 mg Oral TID   cetirizine HCl  5 mg Oral Daily   Chlorhexidine Gluconate Cloth  6 each Topical Daily   citalopram  20 mg Oral Daily   diclofenac Sodium  4 g Topical QID   famotidine  20 mg Oral Daily   feeding supplement  237 mL Oral TID BM   methocarbamol  750 mg Oral TID   multivitamin with minerals  1 tablet Oral Daily   nicotine  21 mg Transdermal Daily   sodium chloride flush  10-40 mL Intracatheter Q12H   traZODone  150 mg Oral QHS   Continuous Infusions:  vancomycin     PRN Meds:.acetaminophen, albuterol, ALPRAZolam, hydrocortisone cream, influenza vac split quadrivalent PF, ondansetron **  OR** ondansetron (ZOFRAN) IV, sodium chloride flush    Objective: Weight change:   Intake/Output Summary (Last 24 hours) at 08/08/2021 1438 Last data filed at 08/07/2021 2300 Gross per 24 hour  Intake 837.78 ml  Output --  Net 837.78 ml    Blood pressure 132/80, pulse 67, temperature 98.5 F (36.9 C), temperature source Oral, resp. rate 16, height 5\' 2"  (1.575 m), weight 50.5 kg, SpO2 100 %. Temp:  [98 F (36.7 C)-98.5 F (36.9 C)] 98.5 F (36.9 C) (11/30 0742) Pulse Rate:  [67-76] 67 (11/30 0742) Resp:  [16-18] 16 (11/30 0742) BP: (97-132)/(64-80) 132/80 (11/30 0742) SpO2:  [99 %-100 %] 100 % (11/30 0742)  Physical Exam: Physical Exam Constitutional:      General: She is not in acute distress.    Appearance: She is well-developed. She is not diaphoretic.  HENT:     Head: Normocephalic and atraumatic.     Right Ear: External ear normal.     Left Ear: External ear normal.     Mouth/Throat:     Pharynx: No oropharyngeal exudate.  Eyes:     General: No  scleral icterus.    Conjunctiva/sclera: Conjunctivae normal.     Pupils: Pupils are equal, round, and reactive to light.  Cardiovascular:     Rate and Rhythm: Normal rate and regular rhythm.  Pulmonary:     Effort: Pulmonary effort is normal. No respiratory distress.     Breath sounds: Normal breath sounds. No wheezing or rales.  Abdominal:     General: There is no distension.     Palpations: Abdomen is soft.  Musculoskeletal:        General: No tenderness. Normal range of motion.  Lymphadenopathy:     Cervical: No cervical adenopathy.  Skin:    General: Skin is warm and dry.     Coloration: Skin is not pale.     Findings: No erythema or rash.  Neurological:     Mental Status: She is alert and oriented to person, place, and time.     Motor: No abnormal muscle tone.     Coordination: Coordination normal.  Psychiatric:        Mood and Affect: Mood normal.        Behavior: Behavior normal.        Thought Content: Thought content normal.        Judgment: Judgment normal.    She does have discomfort with trying to flex neck to read   CBC:    BMET Recent Labs    08/06/21 0251 08/07/21 0454  NA 138  --   K 4.3  --   CL 102  --   CO2 29  --   GLUCOSE 105*  --   BUN 22*  --   CREATININE 0.72 0.71  CALCIUM 8.9  --       Liver Panel  Recent Labs    08/06/21 0251  PROT 6.3*  ALBUMIN 3.1*  AST 17  ALT 15  ALKPHOS 76  BILITOT 0.6        Sedimentation Rate No results for input(s): ESRSEDRATE in the last 72 hours. C-Reactive Protein No results for input(s): CRP in the last 72 hours.  Micro Results: No results found for this or any previous visit (from the past 720 hour(s)).   Studies/Results: CT ANGIO HEAD NECK W WO CM  Result Date: 08/07/2021 CLINICAL DATA:  50 year old female with IV drug use, MRSA bacteremia, endocarditis. Right occipital lobe hemorrhage on MRI.  Query mycotic aneurysm. EXAM: CT ANGIOGRAPHY HEAD AND NECK TECHNIQUE: Multidetector CT  imaging of the head and neck was performed using the standard protocol during bolus administration of intravenous contrast. Multiplanar CT image reconstructions and MIPs were obtained to evaluate the vascular anatomy. Carotid stenosis measurements (when applicable) are obtained utilizing NASCET criteria, using the distal internal carotid diameter as the denominator. CONTRAST:  148mL OMNIPAQUE IOHEXOL 350 MG/ML SOLN COMPARISON:  Brain and cervical spine MRI 08/06/2021. FINDINGS: CT HEAD Brain: In the abnormal right occipital pole there is a roughly 2 cm rounded area of hypodensity corresponding to the hemosiderin and mixed signal on MRI (series 5, image 17) with an adjacent linear area of chronic appearing encephalomalacia. No hyperdense hemorrhage is identified. No significant regional mass effect. Gray-white matter differentiation elsewhere is within normal limits. No ventriculomegaly. No other encephalomalacia identified. Calvarium and skull base: Negative. Paranasal sinuses: Visualized paranasal sinuses and mastoids are stable and well aerated. Orbits: Visualized orbits and scalp soft tissues are within normal limits. CTA NECK Skeleton: Carious and absent dentition. Left side C4-C5 facet hypertrophy but also subchondral irregularity and sclerosis. See recent cervical spine MRI. Upper chest: Centrilobular emphysema with scattered nodular, mostly peripheral and patchy areas of lung opacity. No cavitating nodules. No pleural fluid. Reactive appearing mediastinal lymph nodes. Visible central pulmonary arteries patent. There is a right side PICC line in place. Other neck: Negative soft tissue spaces of the neck. Bilateral cervical lymph nodes are somewhat numerous but not enlarged. Aortic arch: 3 vessel arch configuration with no arch atherosclerosis. Right carotid system: Minor atherosclerosis at the right ECA origin. Negative cervical right ICA. Left carotid system: Similar mild plaque at the anterior carotid  bifurcation, ECA origin. Negative cervical left ICA. Vertebral arteries: Negative proximal right subclavian artery and right vertebral artery origin. Negative right vertebral artery to the skull base. Negative proximal left subclavian artery with a somewhat early origin of the left vertebral artery, normal variant. Left vertebral also has a slightly late entry into the cervical transverse foramen, but is otherwise patent and normal to the skull base. CTA HEAD Posterior circulation: Negative distal vertebral arteries, vertebrobasilar junction. Patent PICA origins. Patent basilar artery without stenosis. Dominant appearing left AICA origin is patent. Patent SCA and left PCA origins. Fetal type right PCA origin. Left posterior communicating artery diminutive or absent. There is mild irregularity of the distal left PCA branches as seen on series 16, image 20. Right PCA P3 branches are attenuated distally. No right PCA or occipital aneurysm identified. No CTA spot sign or abnormal vascularity associated with the occipital pole. Anterior circulation: Both ICA siphons are tortuous but patent without plaque or stenosis. Normal right posterior communicating artery origin. Patent carotid termini. Normal MCA and ACA origins. Normal anterior communicating artery and bilateral ACA branches. Left MCA M1 segment and trifurcation are patent without stenosis. Right MCA M1 segment and trifurcation are patent without stenosis. Bilateral MCA branches are within normal limits. Venous sinuses: Patent, within normal limits. Anatomic variants: Fetal right PCA origin. Early origin of the left vertebral artery. Review of the MIP images confirms the above findings IMPRESSION: 1. Negative for intracranial aneurysm, and the right occipital blood products are hypodense by CT suggesting a subacute bleed. No associated CTA spot sign or abnormal vascularity. There is mild irregularity of the distal bilateral PCA branches. 2. No new intracranial  abnormality. And otherwise negative CTA head and neck, with minimal cervical carotid atherosclerosis. 3. Multifocal abnormal pulmonary opacity could reflect a combination of disseminated  infection and scarring. Underlying Emphysema (ICD10-J43.9). Electronically Signed   By: Genevie Ann M.D.   On: 08/07/2021 09:17   MR THORACIC SPINE W WO CONTRAST  Result Date: 08/07/2021 CLINICAL DATA:  Myelopathy, acute or progressive thoracic myelopathy, septic cervical arthritis, eval for epidural abscess. EXAM: MRI THORACIC WITHOUT AND WITH CONTRAST TECHNIQUE: Multiplanar and multiecho pulse sequences of the thoracic spine were obtained without and with intravenous contrast. CONTRAST:  53mL GADAVIST GADOBUTROL 1 MMOL/ML IV SOLN COMPARISON:  None. FINDINGS: Alignment:  Physiologic. Vertebrae: No fracture, evidence of discitis, or bone lesion. Cord: Normal signal and morphology. No epidural collection. No abnormal contrast enhancement. Paraspinal and other soft tissues: Multiple nodular opacities throughout the lungs compatible with previously reported septic emboli. Disc levels: No spinal canal stenosis. IMPRESSION: No epidural abscess or discitis-osteomyelitis. Electronically Signed   By: Ulyses Jarred M.D.   On: 08/07/2021 19:04   ECHOCARDIOGRAM COMPLETE  Result Date: 08/07/2021    ECHOCARDIOGRAM REPORT   Patient Name:   Yvette Gaines Date of Exam: 08/07/2021 Medical Rec #:  [id number]    Height:       62.0 in Accession #:    [id number]   Weight:       111.4 lb Date of Birth:  [birth year]    BSA:          1.491 m Patient Age:    50 years     BP:           117/74 mmHg Patient Gender: F            HR:           70 bpm. Exam Location:  Inpatient Procedure: 2D Echo, Cardiac Doppler and Color Doppler Indications:    Endocarditis I38  History:        Patient has prior history of Echocardiogram examinations, most                 recent 07/14/2021.  Sonographer:    Bernadene Person RDCS Referring Phys: [place]   1. There is a small mass attached to the anterior leaflet of the tricuspid valve that measures 0.6 cm x 0.5 cm. The lesion is calcified and likely represents healed vegetation given known history of TV endocarditis. There is moderate to severe TR that is eccentric. The tricuspid valve is abnormal. Tricuspid valve regurgitation is moderate to severe.  2. Left ventricular ejection fraction, by estimation, is 60 to 65%. The left ventricle has normal function. The left ventricle has no regional wall motion abnormalities. Left ventricular diastolic parameters were normal. There is the interventricular septum is flattened in diastole ('D' shaped left ventricle), consistent with right ventricular volume overload.  3. Right ventricular systolic function is normal. The right ventricular size is normal. There is normal pulmonary artery systolic pressure. The estimated right ventricular systolic pressure is 85.6 mmHg.  4. Right atrial size was mildly dilated.  5. The mitral valve is grossly normal. Trivial mitral valve regurgitation. No evidence of mitral stenosis.  6. The aortic valve is tricuspid. Aortic valve regurgitation is not visualized. No aortic stenosis is present.  7. The inferior vena cava is dilated in size with >50% respiratory variability, suggesting right atrial pressure of 8 mmHg. Comparison(s): No significant change from prior study. FINDINGS  Left Ventricle: Left ventricular ejection fraction, by estimation, is 60 to 65%. The left ventricle has normal function. The left ventricle has no regional wall motion abnormalities. The left ventricular internal cavity size was normal  in size. There is  no left ventricular hypertrophy. The interventricular septum is flattened in diastole ('D' shaped left ventricle), consistent with right ventricular volume overload. Left ventricular diastolic parameters were normal. Right Ventricle: The right ventricular size is normal. No increase in right ventricular wall thickness.  Right ventricular systolic function is normal. There is normal pulmonary artery systolic pressure. The tricuspid regurgitant velocity is 2.62 m/s, and  with an assumed right atrial pressure of 8 mmHg, the estimated right ventricular systolic pressure is 99.8 mmHg. Left Atrium: Left atrial size was normal in size. Right Atrium: Right atrial size was mildly dilated. Pericardium: Trivial pericardial effusion is present. Mitral Valve: The mitral valve is grossly normal. Trivial mitral valve regurgitation. No evidence of mitral valve stenosis. There is no evidence of mitral valve vegetation. Tricuspid Valve: There is a small mass attached to the anterior leaflet of the tricuspid valve that measures 0.6 cm x 0.5 cm. The lesion is calcified and likely represents healed vegetation given known history of TV endocarditis. There is moderate to severe TR that is eccentric. The tricuspid valve is abnormal. Tricuspid valve regurgitation is moderate to severe. No evidence of tricuspid stenosis. Aortic Valve: The aortic valve is tricuspid. Aortic valve regurgitation is not visualized. No aortic stenosis is present. There is no evidence of aortic valve vegetation. Pulmonic Valve: The pulmonic valve was grossly normal. Pulmonic valve regurgitation is not visualized. No evidence of pulmonic stenosis. There is no evidence of pulmonic valve vegetation. Aorta: The aortic root is normal in size and structure. Venous: The inferior vena cava is dilated in size with greater than 50% respiratory variability, suggesting right atrial pressure of 8 mmHg. IAS/Shunts: The atrial septum is grossly normal.  LEFT VENTRICLE PLAX 2D LVIDd:         4.30 cm     Diastology LVIDs:         3.10 cm     LV e' medial:    13.20 cm/s LV PW:         0.80 cm     LV E/e' medial:  6.7 LV IVS:        0.80 cm     LV e' lateral:   14.00 cm/s LVOT diam:     1.90 cm     LV E/e' lateral: 6.3 LV SV:         60 LV SV Index:   40 LVOT Area:     2.84 cm  LV Volumes (MOD) LV  vol d, MOD A2C: 94.3 ml LV vol d, MOD A4C: 76.2 ml LV vol s, MOD A2C: 33.9 ml LV vol s, MOD A4C: 37.0 ml LV SV MOD A2C:     60.4 ml LV SV MOD A4C:     76.2 ml LV SV MOD BP:      48.9 ml RIGHT VENTRICLE RV S prime:     16.50 cm/s TAPSE (M-mode): 1.8 cm LEFT ATRIUM             Index        RIGHT ATRIUM           Index LA diam:        2.70 cm 1.81 cm/m   RA Area:     19.10 cm LA Vol (A2C):   38.8 ml 26.03 ml/m  RA Volume:   49.90 ml  33.47 ml/m LA Vol (A4C):   36.5 ml 24.48 ml/m LA Biplane Vol: 38.7 ml 25.96 ml/m  AORTIC VALVE LVOT Vmax:  123.00 cm/s LVOT Vmean:  82.900 cm/s LVOT VTI:    0.212 m  AORTA Ao Root diam: 3.10 cm MITRAL VALVE               TRICUSPID VALVE MV Area (PHT): 4.39 cm    TR Peak grad:   27.5 mmHg MV Decel Time: 173 msec    TR Vmax:        262.00 cm/s MV E velocity: 88.30 cm/s MV A velocity: 84.90 cm/s  SHUNTS MV E/A ratio:  1.04        Systemic VTI:  0.21 m                            Systemic Diam: 1.90 cm Eleonore Chiquito MD Electronically signed by Eleonore Chiquito MD Signature Date/Time: 08/07/2021/3:53:31 PM    Final       Assessment/Plan:  INTERVAL HISTORY: 2D echocardiogram shows calcified vegetation.  MRI T-spine is without significant infectious pathology Principal Problem:   MRSA bacteremia Active Problems:   Severe opioid use disorder (HCC)   Endocarditis   Septic pulmonary embolism (HCC)   Chronic viral hepatitis C (HCC)   Shoulder abscess   Calf abscess   Odynophagia   Protein-calorie malnutrition, severe   Chest pain   IVDU (intravenous drug user)   Intraparenchymal hematoma of brain   Cerebral thrombosis with cerebral infarction   Cerebral embolism with cerebral infarction   Subarachnoid hemorrhage   Intracerebral hemorrhage   Cervical discitis    Nicollette Wilhelmi is a 50 y.o. 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  I had not realized that she had been suffering from neck pain from several  weeks.  On Tuesday she had an MRI of the cervical spine performed which shows inflammation and infection at the facet joints at C3-4 C4-5 with some dorsal epidural inflammation and mild spinal stenosis from C5-C6.  MRI of the brain showed a 2.4 cm collection of the right occipital lobe consistent with intraparenchymal hematoma thought to be subacute.  CT angiogram of the brain fortunately does not show any evidence of intracranial aneurysm mycotic or otherwise.  He does show some right-sided occipital blood products consistent with subacute bleed in that area.  MRI of the thoracic spine was performed and was without any significant infectious pathology   #1 New C spine pathology consistent with likely MRSA infection of this site:  I discussed with her today that ordinarily I would restart the clock on IV antibiotics in this setting and plan on sending her home with IV antibiotics.  Obviously home IV antibiotic therapy is not possible  I  would like to for now to continue with the original plan of completing the IV antibiotics on December 10 and then supplying her with a month of doxycycline.  That being said I would also like to make sure that we reevaluate her prior to discharge and can consider whether or not she needs more protracted IV therapy at that point in time.  #2 Hematoma in CNS: may have been transformation of a septic embolism though there are no other lesions.  She never was found to have a left-sided lesion and did not have a PFO detected.  This may have been present much earlier in her course because she has had no CNS imaging during this stay.  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 May ghave had left sided endocarditis with embolism to brain  Continue vancomycin as inpatient with last day of therapy being August 18, 2021 and I would follow this  with oral doxycline though I am open to keeping her here longer for IV therapy  #2 history of hepatitis C seropositivity hepatitis C was not detected on quant RNA.  #3 IVDU: she is really doing so well on suboxone and has appt after DC. Just needs meds to bridge her to that appt..  I spent 36  minutes with the patient including  face to face counseling of the patient regarding her extensive MRSA infection, personally reviewing MRI of the thoracic spine 2D echocardiogram, CBC BMP along with review of medical records in preparation for the visit and during the visit and in coordination of her care.   We will plan on reevaluating her next Thursday to see if she is still seems appropriate for finishing IV antibiotics on the 10th.  Please call if further questions arise in the meantime.     LOS: 40 days   Alcide Evener 08/08/2021, 2:38 PM

## 2021-08-08 NOTE — Progress Notes (Signed)
CTA neg for mycotic aneurysm, MRI T-spine neg for cord abnormality / abscess / stenosis. Will need outpt f/u w/ neurology to make sure she doesn't have a metabolic cause of her myelopathy symptoms, but no surgical intervention needed, no scheduled follow up needed with me but can follow up with me in clinic if the neck pain persists after antibiotic therapy.

## 2021-08-08 NOTE — Progress Notes (Signed)
[facility] HOSPITALISTS PROGRESS NOTE  Yvette Gaines WUJ:[id number] DOB: 09-03-1971 DOA: 06/29/2021 PCP: [name], DO  Status: Remains inpatient appropriate because:  Unsafe to discharge patient with history of IVDU with PICC line in place for outpatient IV therapy Patient requires a total of 6 weeks of IV antibiotics with blood cultures have as of 11/3 and last dose due 12/10  Barriers to discharge: Social: IVDU history so unable to discharge with a PICC line in place to complete antibiotic therapies in the outpatient environment/home setting  Clinical: Final blood cultures from 10/29 returned negative as of 11/3 Has done well on Suboxone therefore we will continue this medication until a bed offer obtained and an actual discharge date confirmed before transitioning over to short acting scheduled Oxy IR  Level of care:  Med-Surg   Code Status: Full Family Communication: Patient DVT prophylaxis: Lovenox COVID vaccination status: Unknown  HPI: 50 y.o. 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 10/21, and came to [facility] seeking treatment.   Patient presented to [facility] on 10/05 for worsening of left shoulder swelling and pain.  Was found to have abscess of left shoulder and left calf,  I&D was done on 10/06 and culture showed MRSA.[name]  Patient however signed out [place] after the surgery. On 10/16, patient came back to [facility] complaining about new onset of chest pains and shortness of breath, CT chest showed multiple bilateral septic emboli, echocardiogram showed new onset of tricuspid vegetation compatible with endocarditis.  Blood culture showed again MRSA.  Patient was started on vancomycin and cefepime since. During hospital stay, patient was also found to develop acute thrombocytopenia, DIC was ruled out.   Patient was admitted to [facility], and started on IV vancomycin, TEE with tricuspid leaflet vegetation.   Subjective: Complaining of generalized body aches and discomfort on her sacrococcyx area that she attributes to repeat imaging on hard tables over the past 24 to 36 hours.  Otherwise in good spirits.  Continues to have headache and neck pain.  Objective: Vitals:   08/08/21 0422 08/08/21 0742  BP: 97/76 132/80  Pulse: 74 67  Resp: 18 16  Temp: 98.5 F (36.9 C) 98.5 F (36.9 C)  SpO2: 99% 100%    Intake/Output Summary (Last 24 hours) at 08/08/2021 9562 Last data filed at 08/07/2021 2300 Gross per 24 hour  Intake 837.78 ml  Output --  Net 837.78 ml       Filed Weights   07/29/21 0500 07/31/21 0521 07/31/21 1500  Weight: 48.9 kg 47.5 kg 50.5 kg    Exam:  Constitutional: NAD, alert Respiratory: Lungs CTA, room air, pulse oximetry 99% Cardiovascular: S1-S2, pulse regular, normotensive, no peripheral edema Abdomen: no tenderness or distention, soft. Bowel sounds positive.  LBM 11/29 Musculoskeletal: Tender left lateral neck. Neurologic: CN 2-12 grossly intact-neurological team documented left visual field cut. Sensation intact, Strength 5/5 x all 4 extremities.  Psychiatric: Normal judgment and insight. Alert and oriented x 3.  Pleasant affect   Assessment/Plan: Acute problems: MRSA bacteremia/MRSA TV endocarditis with moderate TVR/septic emboli/recent right shoulder and right calf abscess /status post I&D 10/6 (at [facility])  -TEE: tricuspid valve endocarditis.  CT surgery: not a candidate for angio VAC due to small size of vegetation -Narrowed to vancomycin 11/5-LD due 12/10 -Given recent clinical findings concerning for septic emboli ID has recommended repeat echocardiogram:  small  mass attached to the anterior leaflet of the tricuspid valve that measures 0.6 cm x 0.5 cm. The lesion is calcified and likely represents healed vegetation given known history of  TV endocarditis. There is moderate to severe TR that is eccentric. The tricuspid valve is abnormal  Pleuritic chest pain Resolved -11/5 echocardiogram: Small residual vegetation noted on the anterior TV leaflet. -CTA chest : Continued evolution of septic pulmonary emboli now globally increased surrounding consolidated opacifications which likely explains recent pleuritic chest discomfort  RLQ hematoma -With decreased pain  Headache and sinus congestion/intraparenchymal hematoma likely secondary to septic emboli -Continue Zyrtec and ibuprofen prn -11/28 MRI brain consistent with small IP hematoma -CTA head and neck ordered by ID demonstrates subacute bleed without evidence of infectious etiology-no evidence of aneurysm/mycotic changes A1C/FLP within normal limits -Neurologist states ICH NOT etiology of HA (sts ICH at least 57-58 weeks old)  Focal LEFT visual field cut (left quadranaopsia) Due to septic emboli and neuro expects to slowly resolve  Cervical neck discomfort -Continue K pad along with Robaxin and Voltaren gel -MRI cervical spine reveals left upper C-spine septic arthritis-ID plans to continue current IV anbxs thru 12/10 as planned then transition to PO bioavailable anbx to use after DC  Hyperreflexive LEs/myelopathy Appreciate neurosurgical assistance MRI thoracic UNREMARKABLE Ck TSH   Intraparenchymal hematoma -Seen on MRI brain 11/28-d/w attending and appears c/w hemorraghic transformation of septic emboli -Neurology consulted -Lovenox and Advil dc'd -Considered Depakene 500 mg IV x 1 but on backorder-consider Decadron for HA but defer to neuro  IVDU with narcotics -Continue Suboxone.  After bed offer secured and actual discharge date confirmed plan transition to Oxy IR 10 mg Q 6 hours as recommended by Pharmacist and discontinue Suboxone for duration of SNF stay.  -Recommend resume Suboxone once discharged from SNF. Previously received Suboxone at a clinic in  [facility]  Depression and insomnia Continue preadmission Celexa, BuSpar and trazodone at bedtime  BuSpar dose increased to 7.5 mg TID on 11/17  Thrombocytopenia:  Resolved.   Recent right shoulder and calf abscess -Source of bacteremia -Status post I&D on 10/06 at [place] at outside hospital revealed no infectious involvement of AC joint    Severe protein calorie malnutrition Etiology: chronic illness and poor intake prior to admission secondary to IVDU Weight stable at 100.53 lbs  Mild intermittent asthma/current tobacco abuse -Currently asymptomatic -Continue prn bronchodilators and nicotine patch   Normocytic anemia with iron deficiency -Secondary to chronic malnutrition -Current hemoglobin 8.4 -Transfuse for hemoglobin </= 7.      +HCV AB -ID recommended treatment after discharge. -Also ID recommended HIV preexposure prophylaxis with either oral Truvada or long-acting aptitude injectable   Blood culture data: -Blood cultures positive for MRSA on 10/21, 10/22.   -Blood cultures 1/2 positive from 10/24. -Blood cultures 1/2 positive from 10/25. -Blood cultures 2 out of 2 positive from 10/27. -Blood cultures from 10/29 and finalized on 11/3   Data Reviewed: Basic Metabolic Panel: Recent Labs  Lab 08/06/21 0251 08/07/21 0454  NA 138  --   K 4.3  --   CL 102  --   CO2 29  --   GLUCOSE 105*  --   BUN 22*  --   CREATININE 0.72 0.71  CALCIUM 8.9  --      Scheduled Meds:  buprenorphine-naloxone  1.5 tablet Sublingual Daily   busPIRone  7.5 mg Oral TID   cetirizine HCl  5 mg Oral Daily   Chlorhexidine Gluconate Cloth  6  each Topical Daily   citalopram  20 mg Oral Daily   diclofenac Sodium  4 g Topical QID   famotidine  20 mg Oral Daily   feeding supplement  237 mL Oral TID BM   methocarbamol  500 mg Oral TID   multivitamin with minerals  1 tablet Oral Daily   nicotine  21 mg Transdermal Daily   sodium chloride flush  10-40 mL  Intracatheter Q12H   traZODone  150 mg Oral QHS   Continuous Infusions:  vancomycin      Principal Problem:   MRSA bacteremia Active Problems:   Severe opioid use disorder (HCC)   Endocarditis   Septic pulmonary embolism (HCC)   Chronic viral hepatitis C (HCC)   Shoulder abscess   Calf abscess   Odynophagia   Protein-calorie malnutrition, severe   Chest pain   IVDU (intravenous drug user)   Intraparenchymal hematoma of brain   Cerebral thrombosis with cerebral infarction   Cerebral embolism with cerebral infarction   Subarachnoid hemorrhage   Intracerebral hemorrhage   Cervical discitis   Consultants: Infectious disease Cardiothoracic surgery Neurosurgery Neurology  Procedures: TTE TEE  Antibiotics: Vancomycin 10/21 through 10/28 Valacyclovir 10/23 through 10/29 Daptomycin 10/28 >> 11/05 Ceftaroline 10/28 >> 11/05 Vancomycin 11/05 >>   Time spent: 15 minutes    Erin Hearing ANP  [facility] 7 am - 330 pm/M-F for direct patient care and secure chat Please refer to Amion for contact info 40  days

## 2021-08-09 DIAGNOSIS — R7881 Bacteremia: Secondary | ICD-10-CM | POA: Diagnosis not present

## 2021-08-09 DIAGNOSIS — B9562 Methicillin resistant Staphylococcus aureus infection as the cause of diseases classified elsewhere: Secondary | ICD-10-CM | POA: Diagnosis not present

## 2021-08-09 LAB — T3: T3, Total: 123 ng/dL (ref 71–180)

## 2021-08-09 MED ORDER — BACLOFEN 10 MG PO TABS
5.0000 mg | ORAL_TABLET | Freq: Four times a day (QID) | ORAL | Status: DC
  Administered 2021-08-09 – 2021-08-13 (×17): 5 mg via ORAL
  Filled 2021-08-09 (×17): qty 1

## 2021-08-09 NOTE — Progress Notes (Signed)
Per IV team nurse Jasmine Pang there was some redness and pus noticed at PICC site when doing dressing change. Made Ghimire,MD aware.

## 2021-08-09 NOTE — Progress Notes (Signed)
Went to change PICC dressing. Small amount of pus and redness from PICC site noted. Dressing and caps changed. Primary RN aware and will notify MD. Will follow up.

## 2021-08-09 NOTE — Progress Notes (Signed)
Mobility Specialist Progress Note:   08/09/21 1100  Mobility  Activity Ambulated in hall  Level of Assistance Independent  Assistive Device None  Distance Ambulated (ft) 1100 ft  Mobility Ambulated independently in hallway  Mobility Response Tolerated well  Mobility performed by Mobility specialist  $Mobility charge 1 Mobility   Pt with increased neck pain this am. Otherwise asx during ambulation.   Nelta Numbers Mobility Specialist  Phone [phone]

## 2021-08-09 NOTE — Progress Notes (Signed)
[facility] HOSPITALISTS PROGRESS NOTE  Yvette Gaines JJO:[id number] DOB: 05-22-1971 DOA: 06/29/2021 PCP: [name], DO  Status: Remains inpatient appropriate because:  Unsafe to discharge patient with history of IVDU with PICC line in place for outpatient IV therapy Patient requires a total of 6 weeks of IV antibiotics with blood cultures have as of 11/3 and last dose due 12/10  Barriers to discharge: Social: IVDU history so unable to discharge with a PICC line in place to complete antibiotic therapies in the outpatient environment/home setting  Clinical: Final blood cultures from 10/29 returned negative as of 11/3 Has done well on Suboxone therefore we will continue this medication until a bed offer obtained and an actual discharge date confirmed before transitioning over to short acting scheduled Oxy IR  Level of care:  Med-Surg   Code Status: Full Family Communication: Patient DVT prophylaxis: Lovenox discontinued secondary to ICH-SCDs COVID vaccination status: Unknown  HPI: 50 y.o. 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   Patient presented to [facility] on 10/05 for worsening of left shoulder swelling and pain.  Was found to have abscess of left shoulder and left calf,  I&D was done on 10/06 and culture showed MRSA.[name]  Patient however signed out [place] after the surgery. On 10/16, patient came back to [facility] complaining about new onset of chest pains and shortness of breath, CT chest showed multiple bilateral septic emboli, echocardiogram showed new onset of tricuspid vegetation compatible with endocarditis.  Blood culture showed again MRSA.  Patient was started on vancomycin and cefepime since. During hospital stay, patient was also found to develop acute  thrombocytopenia, DIC was ruled out.  Patient was admitted to [facility], and started on IV vancomycin, TEE with tricuspid leaflet vegetation.   Subjective: Alert and ambulating in room.  Complains of significant neck pain radiating down towards hip level.  No reported neurological deficits.  No dizziness with ambulation or with head/position changes.  Objective: Vitals:   08/09/21 0500 08/09/21 0759  BP: (!) 96/57 (!) 89/53  Pulse: 64 66  Resp: 16 17  Temp: 98 F (36.7 C) 98 F (36.7 C)  SpO2: 99% 100%   No intake or output data in the 24 hours ending 08/09/21 0830      Filed Weights   07/29/21 0500 07/31/21 0521 07/31/21 1500  Weight: 48.9 kg 47.5 kg 50.5 kg    Exam:  Constitutional: NAD, alert Respiratory: Lungs CTA, room air, pulse oximetry 99% Cardiovascular: S1-S2, pulse regular, normotensive, no peripheral edema Abdomen: no tenderness or distention, soft. Bowel sounds positive.  LBM 11/30 Musculoskeletal: Tender bilateral upper neck without any tenderness over cervical spine Neurologic: CN 2-12 grossly intact-neurological team documented left visual field cut. Sensation intact, Strength 5/5 x all 4 extremities.  Psychiatric: Normal judgment and insight. Alert and oriented x 3.  Pleasant affect   Assessment/Plan: Acute problems: MRSA bacteremia/MRSA TV endocarditis with moderate TVR/septic emboli/recent right shoulder and right calf abscess /status post I&D 10/6 (at [facility])  -TEE: tricuspid valve endocarditis.  CT surgery: not a candidate for angio VAC due to small size of vegetation -Narrowed to vancomycin 11/5-LD due 12/10 -11/27 repeat echocardiogram:  small mass attached to the anterior leaflet of the tricuspid valve that measures 0.6 cm x 0.5 cm. The lesion is calcified and likely represents healed vegetation given  known history of TV endocarditis. There is moderate to severe TR that is eccentric. The tricuspid valve is abnormal -12/01 IV team  performing routine dressing care on PICC line.  Noticed redness and drainage at insertion site.  As precaution will place peripheral IV and remove PICC line  Pleuritic chest pain Resolved -11/5 echocardiogram: Small residual vegetation noted on the anterior TV leaflet. -CTA chest : Continued evolution of septic pulmonary emboli now globally increased surrounding consolidated opacifications which likely explains recent pleuritic chest discomfort  RLQ hematoma -With decreased pain  Headache and sinus congestion/intraparenchymal hematoma likely secondary to septic emboli -Continue Zyrtec and ibuprofen prn -11/28 MRI brain consistent with small IP hematoma -CTA head and neck ordered by ID demonstrates subacute bleed without evidence of infectious etiology-no evidence of aneurysm/mycotic changes A1C/FLP within normal limits -Neurologist states ICH NOT etiology of HA (sts ICH at least 30-65 weeks old)  Focal LEFT visual field cut (left quadranaopsia) Due to septic emboli and neuro expects to slowly resolve  Cervical neck discomfort -Continue K pad along with Voltaren gel -Given continued symptoms we will discontinue Robaxin in favor of low-dose baclofen QID -MRI cervical spine reveals left upper C-spine septic arthritis-ID plans to continue current IV anbxs thru 12/10 as planned then transition to PO bioavailable anbx to use after DC  Hyperreflexive LEs/myelopathy Appreciate neurosurgical assistance MRI thoracic UNREMARKABLE Ck TSH   Intraparenchymal hematoma -Seen on MRI brain 11/28-d/w attending and appears c/w hemorraghic transformation of septic emboli -Neurology consulted -Lovenox and Advil dc'd  IVDU with narcotics -Continue Suboxone.  After bed offer secured and actual discharge date confirmed plan transition to Oxy IR 10 mg Q 6 hours as recommended by Pharmacist and discontinue Suboxone for duration of SNF stay.  -Recommend resume Suboxone once discharged from SNF. Previously  received Suboxone at a clinic in [facility] -Of note, dose increased this hospitalization due to acute posterior neck pain that has not responded to recent therapies.  Unable to use narcotics secondary to requirement for Suboxone utilize NSAIDs secondary to intracranial hemorrhage  Depression and insomnia Continue preadmission Celexa, BuSpar and trazodone at bedtime  BuSpar dose increased to 7.5 mg TID on 11/17  Thrombocytopenia:  Resolved.   Recent right shoulder and calf abscess -Source of bacteremia -Status post I&D on 10/06 at [place] at outside hospital revealed no infectious involvement of AC joint    Severe protein calorie malnutrition Etiology: chronic illness and poor intake prior to admission secondary to IVDU Weight stable at 100.53 lbs  Mild intermittent asthma/current tobacco abuse -Currently asymptomatic -Continue prn bronchodilators and nicotine patch   Normocytic anemia with iron deficiency -Secondary to chronic malnutrition -Current hemoglobin 8.4 -Transfuse for hemoglobin </= 7.      +HCV AB -ID recommended treatment after discharge. -Also ID recommended HIV preexposure prophylaxis with either oral Truvada or long-acting aptitude injectable   Blood culture data: -Blood cultures positive for MRSA on 10/21, 10/22.   -Blood cultures 1/2 positive from 10/24. -Blood cultures 1/2 positive from 10/25. -Blood cultures 2 out of 2 positive from 10/27. -Blood cultures from 10/29 and finalized on 11/3   Data Reviewed: Basic Metabolic Panel: Recent Labs  Lab 08/06/21 0251 08/07/21 0454  NA 138  --   K 4.3  --   CL 102  --   CO2 29  --   GLUCOSE 105*  --   BUN 22*  --   CREATININE 0.72 0.71  CALCIUM 8.9  --      Scheduled Meds:  buprenorphine-naloxone  2 tablet Sublingual Daily   busPIRone  7.5 mg Oral TID   cetirizine HCl  5 mg Oral Daily   Chlorhexidine Gluconate Cloth  6 each Topical Daily   citalopram  20 mg Oral Daily    diclofenac Sodium  4 g Topical QID   famotidine  20 mg Oral Daily   feeding supplement  237 mL Oral TID BM   methocarbamol  750 mg Oral TID   multivitamin with minerals  1 tablet Oral Daily   nicotine  21 mg Transdermal Daily   sodium chloride flush  10-40 mL Intracatheter Q12H   traZODone  150 mg Oral QHS   Continuous Infusions:  vancomycin 1,250 mg (08/08/21 2032)    Principal Problem:   MRSA bacteremia Active Problems:   Severe opioid use disorder ([place])   Endocarditis   Septic pulmonary embolism (HCC)   Chronic viral hepatitis C ([place])   Shoulder abscess   Calf abscess   Odynophagia   Protein-calorie malnutrition, severe   Chest pain   IVDU (intravenous drug user)   Intraparenchymal hematoma of brain   Cerebral thrombosis with cerebral infarction   Cerebral embolism with cerebral infarction   Subarachnoid hemorrhage   Intracerebral hemorrhage   Cervical discitis   Consultants: Infectious disease Cardiothoracic surgery Neurosurgery Neurology  Procedures: TTE TEE  Antibiotics: Vancomycin 10/21 through 10/28 Valacyclovir 10/23 through 10/29 Daptomycin 10/28 >> 11/05 Ceftaroline 10/28 >> 11/05 Vancomycin 11/05 >>   Time spent: 15 minutes    Erin Hearing ANP  [facility] 7 am - 330 pm/M-F for direct patient care and secure chat Please refer to Amion for contact info 41  days

## 2021-08-10 DIAGNOSIS — R7881 Bacteremia: Secondary | ICD-10-CM | POA: Diagnosis not present

## 2021-08-10 DIAGNOSIS — B9562 Methicillin resistant Staphylococcus aureus infection as the cause of diseases classified elsewhere: Secondary | ICD-10-CM | POA: Diagnosis not present

## 2021-08-10 LAB — BASIC METABOLIC PANEL
Anion gap: 7 (ref 5–15)
BUN: 22 mg/dL — ABNORMAL HIGH (ref 6–20)
CO2: 26 mmol/L (ref 22–32)
Calcium: 9.3 mg/dL (ref 8.9–10.3)
Chloride: 103 mmol/L (ref 98–111)
Creatinine, Ser: 0.68 mg/dL (ref 0.44–1.00)
GFR, Estimated: >60 mL/min (ref >60)
Glucose, Bld: 104 mg/dL — ABNORMAL HIGH (ref 70–99)
Potassium: 4.8 mmol/L (ref 3.5–5.1)
Sodium: 136 mmol/L (ref 135–145)

## 2021-08-10 LAB — CBC WITH DIFFERENTIAL/PLATELET
Abs Immature Granulocytes: 0.03 10*3/uL (ref 0.00–0.07)
Basophils Absolute: 0 10*3/uL (ref 0.0–0.1)
Basophils Relative: 1 %
Eosinophils Absolute: 0.7 10*3/uL — ABNORMAL HIGH (ref 0.0–0.5)
Eosinophils Relative: 10 %
HCT: 37.1 % (ref 36.0–46.0)
Hemoglobin: 11.9 g/dL — ABNORMAL LOW (ref 12.0–15.0)
Immature Granulocytes: 0 %
Lymphocytes Relative: 26 %
Lymphs Abs: 1.8 10*3/uL (ref 0.7–4.0)
MCH: 28.7 pg (ref 26.0–34.0)
MCHC: 32.1 g/dL (ref 30.0–36.0)
MCV: 89.6 fL (ref 80.0–100.0)
Monocytes Absolute: 0.4 10*3/uL (ref 0.1–1.0)
Monocytes Relative: 6 %
Neutro Abs: 3.9 10*3/uL (ref 1.7–7.7)
Neutrophils Relative %: 57 %
Platelets: 315 10*3/uL (ref 150–400)
RBC: 4.14 MIL/uL (ref 3.87–5.11)
RDW: 16.9 % — ABNORMAL HIGH (ref 11.5–15.5)
WBC: 6.8 10*3/uL (ref 4.0–10.5)
nRBC: 0 % (ref 0.0–0.2)

## 2021-08-10 MED ORDER — BUPRENORPHINE HCL-NALOXONE HCL 8-2 MG SL SUBL
2.0000 | SUBLINGUAL_TABLET | Freq: Two times a day (BID) | SUBLINGUAL | Status: DC
  Administered 2021-08-10 – 2021-08-13 (×7): 2 via SUBLINGUAL
  Filled 2021-08-10 (×2): qty 2
  Filled 2021-08-10: qty 1
  Filled 2021-08-10 (×3): qty 2
  Filled 2021-08-10: qty 1

## 2021-08-10 MED ORDER — BUPRENORPHINE HCL-NALOXONE HCL 8-2 MG SL SUBL
2.0000 | SUBLINGUAL_TABLET | Freq: Two times a day (BID) | SUBLINGUAL | Status: DC
  Filled 2021-08-10: qty 2

## 2021-08-10 NOTE — Progress Notes (Signed)
Pharmacy Antibiotic Note  Yvette Gaines is a 50 y.o. 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 Patient was previously not clearing her blood cultures and was put on combination therapy with daptomycin and ceftaroline for 7 days ending 11/3. Blood cultures from 10/29 with no growth (final). ECHO on 11/6 showed small residual vegetations on the anterior TV leaflet. Pharmacy was consulted for vancomycin dosing.  Last AUC calculated at 429 (at low end of goal range) on 11/29 and dose increased slightly given new C-spine pathology.  SCr stable (Last 11/29).  Plan: Continue vancomycin 1250 mg IV Q24H  Monitor renal function, weekly vancomycin levels (next levels 12/5) Plan to treat for 6 weeks weeks (10/29 - 12/10) or longer given new C-spine pathology per ID  Height: 5\' 2"  (157.5 cm) Weight: 50.5 kg (111 lb 6.4 oz) IBW/kg (Calculated) : 50.1 kg  Temp (24hrs), Avg:98.3 F (36.8 C), Min:98 F (36.7 C), Max:98.5 F (36.9 C)  Recent Labs  Lab 08/06/21 0251 08/06/21 2206 08/07/21 0454 08/07/21 1918  WBC 5.2  --   --   --   CREATININE 0.72  --  0.71  --   VANCOTROUGH  --   --   --  8*  VANCOPEAK  --  32  --   --      Estimated Creatinine Clearance: 66.5 mL/min (by C-G formula based on SCr of 0.71 mg/dL).    No Known Allergies  Anti-inflectives this admission: Valacyclovir 10/23 >> 10/29 Vancomycin 10/21 >> 10/28, restarted 11/4 >>(12/10) Daptomycin 10/28 >> 11/3 Ceftaroline 10/28 >> 11/3  Vancomycin levels  11/8 VP 33; 11/9 VT 7, AUC 443 > continue  11/15 VP 38: 11/15 VT 8 (listed as pk in epic), AUC 480 >continue  11/28 VP 32; VT 7 - increase dose   Microbiology results: 10/21 BCx: MRSA 3/3 10/22 BCx >> MRSA 2/2 10/24 BCx >> 1/3 MRSA  10/25 Bcx >> 1/4 MRSA 10/27 Bcx >> MRSA 4/4 10/29 BCx >> NG/final 10/21 Hepatitis A, Hepatitis B, HIV serologies: negative 10/21 Hepatitis C antibody: reactive 10/27 HCV quant: >50 IU/ml  Arturo Morton, PharmD,  BCPS Please check AMION for all [place] contact numbers Clinical Pharmacist 08/10/2021 11:23 AM

## 2021-08-10 NOTE — Progress Notes (Signed)
Mobility Specialist Progress Note:   08/10/21 1100  Mobility  Activity Ambulated in hall  Level of Assistance Independent  Assistive Device None  Distance Ambulated (ft) 1650 ft  Mobility Ambulated independently in hallway  Mobility Response Tolerated well  Mobility performed by Mobility specialist  $Mobility charge 1 Mobility   Pt asx during ambulation.   Nelta Numbers Mobility Specialist  Phone [phone]

## 2021-08-10 NOTE — Progress Notes (Signed)
This chaplain is present for F/U spiritual care.    This chaplain practiced reflective listening as the Pt. updated the chaplain on the Pt. changes since the last visit.  The chaplain opened a space for laughter and tears, closing the visit with prayer.  This chaplain is available for F/U spiritual care as needed.  Chaplain Sallyanne Kuster [phone]

## 2021-08-10 NOTE — Progress Notes (Signed)
[facility] HOSPITALISTS PROGRESS NOTE  Yvette Gaines [id number]:[id number] DOB: 1970/10/25 DOA: 06/29/2021 PCP: [name], DO  Status: Remains inpatient appropriate because:  Unsafe to discharge patient with history of IVDU with PICC line in place for outpatient IV therapy Patient requires a total of 6 weeks of IV antibiotics with blood cultures have as of 11/3 and last dose due 12/10  Barriers to discharge: Social: IVDU history so unable to discharge with a PICC line in place to complete antibiotic therapies in the outpatient environment/home setting  Clinical: Final blood cultures from 10/29 returned negative as of 11/3 Has done well on Suboxone therefore we will continue this medication until a bed offer obtained and an actual discharge date confirmed before transitioning over to short acting scheduled Oxy IR  Level of care:  Med-Surg   Code Status: Full Family Communication: Patient DVT prophylaxis: Lovenox discontinued secondary to ICH-SCDs COVID vaccination status: Unknown  HPI: 50 y.o. 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   Patient presented to [facility] on 10/05 for worsening of left shoulder swelling and pain.  Was found to have abscess of left shoulder and left calf,  I&D was done on 10/06 and culture showed MRSA.[name]  Patient however signed out [place] after the surgery. On 10/16, patient came back to [facility] complaining about new onset of chest pains and shortness of breath, CT chest showed multiple bilateral septic emboli, echocardiogram showed new onset of tricuspid vegetation compatible with endocarditis.  Blood culture showed again MRSA.  Patient was started on vancomycin and cefepime since. During hospital stay, patient was also found to develop acute  thrombocytopenia, DIC was ruled out.  Patient was admitted to [facility], and started on IV vancomycin, TEE with tricuspid leaflet vegetation.   Subjective: Alert.  States generalized pain discomfort and back pain much improved though continues to have some neck pain at location of known septic arthritis.  Objective: Vitals:   08/10/21 0527 08/10/21 0750  BP: (!) 131/112 102/81  Pulse: 81 70  Resp: 17 16  Temp: 98 F (36.7 C) 98.5 F (36.9 C)  SpO2: 94% 100%    Intake/Output Summary (Last 24 hours) at 08/10/2021 0801 Last data filed at 08/09/2021 1552 Gross per 24 hour  Intake 250 ml  Output --  Net 250 ml        Filed Weights   07/29/21 0500 07/31/21 0521 07/31/21 1500  Weight: 48.9 kg 47.5 kg 50.5 kg    Exam:  Constitutional: NAD, alert Respiratory: Bilateral anterior lung sounds remain CTA on room air, pulse oximetry 99% Cardiovascular: S1-S2, pulse regular, normotensive, no peripheral edema Abdomen: no tenderness or distention, soft. Bowel sounds positive.  LBM 11/30 Musculoskeletal: Tender bilateral upper neck without any tenderness over cervical spine this has improved with the addition of baclofen Neurologic: CN 2-12 grossly intact-neurological team documented left visual field cut. Sensation intact, Strength 5/5 x all 4 extremities.  Psychiatric: Normal judgment and insight. Alert and oriented x 3.  Pleasant affect   Assessment/Plan: Acute problems: MRSA bacteremia/MRSA TV endocarditis with moderate TVR/septic emboli/recent right shoulder and right calf abscess /status post I&D 10/6 (at [facility])  -TEE: tricuspid valve endocarditis.  CT surgery: not a candidate for angio VAC due to small size of vegetation -Narrowed to vancomycin 11/5-LD due 12/10 -11/27 repeat echocardiogram:  small mass  attached to the anterior leaflet of the tricuspid valve that measures 0.6 cm x 0.5 cm. The lesion is calcified and likely represents healed vegetation given known  history of TV endocarditis. There is moderate to severe TR that is eccentric. The tricuspid valve is abnormal -12/01 IV team noted purulent drainage at PICC insertion site without any associated redness or streaking.  As precaution PICC line removed and PIV placed.  12/2 prior PICC site unremarkable and dressing in place.  12/2 WBC 6.8 with normal neutrophils and absolute neutrophil  Pleuritic chest pain Resolved -CTA chest : Continued evolution of septic pulmonary emboli now globally increased surrounding consolidated opacifications which likely explains recent pleuritic chest discomfort  Headache and sinus congestion/intraparenchymal hematoma likely secondary to septic emboli -Continue Zyrtec and ibuprofen prn -11/28 MRI brain: small IP hematoma (ICH) -CTA head and neck:  subacute bleed without evidence of infectious etiology -Neurologist states ICH NOT etiology of HA (sts ICH at least 75-15 weeks old)  Focal LEFT visual field cut (left quadranaopsia) Due to septic emboli and neuro expects to slowly resolve  Cervical neck discomfort -Continue K pad along with Voltaren gel -12/01 discontinued Robaxin in favor of low-dose baclofen QID with improvement in discomfort -MRI cervical spine reveals left upper C-spine septic arthritis-ID plans to continue current IV anbxs thru 12/10 as planned then transition to PO bioavailable anbx to use after DC  Hyperreflexive LEs/myelopathy Appreciate neurosurgical assistance MRI thoracic UNREMARKABLE Ck TSH   Intraparenchymal hematoma -Seen on MRI brain 11/28-d/w attending and appears c/w hemorraghic transformation of septic emboli -Neurology consulted -Lovenox and Advil dc'd  IVDU with narcotics -Continue Suboxone.  Noted dosage increased during hospitalization to treat acute pain from septic arthritis -Unable to use narcotics secondary to requirement for Suboxone utilize NSAIDs secondary to intracranial hemorrhage -Previously received Suboxone  therapy at clinic in [facility]  Depression and insomnia Continue preadmission Celexa, BuSpar and trazodone at bedtime  BuSpar dose increased to 7.5 mg TID on 11/17  Thrombocytopenia:  Resolved.   Recent right shoulder and calf abscess -Source of bacteremia -Status post I&D on 10/06 at [place] at outside hospital revealed no infectious involvement of AC joint    Severe protein calorie malnutrition Etiology: chronic illness and poor intake prior to admission secondary to IVDU Weight stable at 100.53 lbs  Mild intermittent asthma/current tobacco abuse -Currently asymptomatic -Continue prn bronchodilators and nicotine patch   Normocytic anemia with iron deficiency -Secondary to chronic malnutrition -Current hemoglobin 8.4 -Transfuse for hemoglobin </= 7.      +HCV AB -ID recommended treatment after discharge. -Also ID recommended HIV preexposure prophylaxis with either oral Truvada or long-acting aptitude injectable   Blood culture data: -Blood cultures positive for MRSA on 10/21, 10/22.   -Blood cultures 1/2 positive from 10/24. -Blood cultures 1/2 positive from 10/25. -Blood cultures 2 out of 2 positive from 10/27. -Blood cultures from 10/29 and finalized on 11/3   Data Reviewed: Basic Metabolic Panel: Recent Labs  Lab 08/06/21 0251 08/07/21 0454  NA 138  --   K 4.3  --   CL 102  --   CO2 29  --   GLUCOSE 105*  --   BUN 22*  --   CREATININE 0.72 0.71  CALCIUM 8.9  --      Scheduled Meds:  baclofen  5 mg Oral QID   buprenorphine-naloxone  2 tablet Sublingual Daily   busPIRone  7.5 mg Oral TID   cetirizine HCl  5 mg Oral Daily  Chlorhexidine Gluconate Cloth  6 each Topical Daily   citalopram  20 mg Oral Daily   diclofenac Sodium  4 g Topical QID   famotidine  20 mg Oral Daily   feeding supplement  237 mL Oral TID BM   multivitamin with minerals  1 tablet Oral Daily   nicotine  21 mg Transdermal Daily   sodium chloride flush  10-40  mL Intracatheter Q12H   traZODone  150 mg Oral QHS   Continuous Infusions:  vancomycin 1,250 mg (08/09/21 2017)    Principal Problem:   MRSA bacteremia Active Problems:   Severe opioid use disorder (HCC)   Endocarditis   Septic pulmonary embolism (HCC)   Chronic viral hepatitis C ([place])   Shoulder abscess   Calf abscess   Odynophagia   Protein-calorie malnutrition, severe   Chest pain   IVDU (intravenous drug user)   Intraparenchymal hematoma of brain   Cerebral thrombosis with cerebral infarction   Cerebral embolism with cerebral infarction   Subarachnoid hemorrhage   Intracerebral hemorrhage   Cervical discitis   Consultants: Infectious disease Cardiothoracic surgery Neurosurgery Neurology  Procedures: TTE TEE  Antibiotics: Vancomycin 10/21 through 10/28 Valacyclovir 10/23 through 10/29 Daptomycin 10/28 >> 11/05 Ceftaroline 10/28 >> 11/05 Vancomycin 11/05 >>   Time spent: 15 minutes    Erin Hearing ANP  [facility] 7 am - 330 pm/M-F for direct patient care and secure chat Please refer to Amion for contact info 42  days

## 2021-08-11 DIAGNOSIS — R7881 Bacteremia: Secondary | ICD-10-CM | POA: Diagnosis not present

## 2021-08-11 DIAGNOSIS — B9562 Methicillin resistant Staphylococcus aureus infection as the cause of diseases classified elsewhere: Secondary | ICD-10-CM | POA: Diagnosis not present

## 2021-08-11 NOTE — Progress Notes (Signed)
[facility] HOSPITALISTS PROGRESS NOTE  Yvette Gaines DDU:[id number] DOB: 01-12-1971 DOA: 06/29/2021 PCP: [name], DO  Status: Remains inpatient appropriate because:  Unsafe to discharge patient with history of IVDU with PICC line in place for outpatient IV therapy Patient requires a total of 6 weeks of IV antibiotics with blood cultures have as of 11/3 and last dose due 12/10  Barriers to discharge: Social: IVDU history so unable to discharge with a PICC line in place to complete antibiotic therapies in the outpatient environment/home setting  Clinical: Final blood cultures from 10/29 returned negative as of 11/3 Has done well on Suboxone therefore we will continue this medication until a bed offer obtained and an actual discharge date confirmed before transitioning over to short acting scheduled Oxy IR  Level of care:  Med-Surg   Code Status: Full Family Communication: Patient DVT prophylaxis: Lovenox discontinued secondary to ICH-SCDs COVID vaccination status: Unknown  HPI: 50 y.o. 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   Patient presented to [facility] on 10/05 for worsening of left shoulder swelling and pain.  Was found to have abscess of left shoulder and left calf,  I&D was done on 10/06 and culture showed MRSA.[name]  Patient however signed out [place] after the surgery. On 10/16, patient came back to [facility] complaining about new onset of chest pains and shortness of breath, CT chest showed multiple bilateral septic emboli, echocardiogram showed new onset of tricuspid vegetation compatible with endocarditis.  Blood culture showed again MRSA.  Patient was started on vancomycin and cefepime since. During hospital stay, patient was also found to develop acute  thrombocytopenia, DIC was ruled out.  Patient was admitted to [facility], and started on IV vancomycin, TEE with tricuspid leaflet vegetation.   Subjective: Patient in bed, appears comfortable, denies any headache, stable neck pain, no fever, no chest pain or pressure, no shortness of breath , no abdominal pain. No new focal weakness.   Objective: Vitals:   08/11/21 0527 08/11/21 0730  BP: 107/77 99/76  Pulse: 70 75  Resp: 18 16  Temp: 98.5 F (36.9 C) 97.9 F (36.6 C)  SpO2: 99% 100%    Intake/Output Summary (Last 24 hours) at 08/11/2021 0956 Last data filed at 08/11/2021 0527 Gross per 24 hour  Intake 118 ml  Output --  Net 118 ml        Filed Weights   07/29/21 0500 07/31/21 0521 07/31/21 1500  Weight: 48.9 kg 47.5 kg 50.5 kg    Exam:  Awake Alert, No new F.N deficits, Normal affect [place].AT,PERRAL Supple Neck, No JVD,   Symmetrical Chest wall movement, Good air movement bilaterally, CTAB RRR,No Gallops, Rubs or new Murmurs,  +ve B.Sounds, Abd Soft, No tenderness,   No Cyanosis, Clubbing or edema     Assessment/Plan: Acute problems: MRSA bacteremia/MRSA TV endocarditis with moderate TVR/septic emboli to Lungs, Brain, C spine/recent right shoulder and right calf abscess /status post I&D 10/6 (at [facility])  -TEE: tricuspid valve endocarditis.  CT surgery: not a candidate for angio VAC due to small size of vegetation -Narrowed to vancomycin 11/5-LD due 12/10 -11/27 repeat echocardiogram:  small mass attached to the anterior leaflet of the tricuspid valve that measures 0.6 cm x 0.5 cm. The lesion is calcified and likely represents healed vegetation given known history of TV endocarditis.  There is moderate to severe TR that is eccentric. The tricuspid valve is abnormal -12/01 IV team noted purulent drainage at PICC insertion site without any associated redness or streaking.  As precaution PICC line removed and PIV placed.  12/2 prior PICC site unremarkable and  dressing in place.  12/2 WBC 6.8 with normal neutrophils and absolute neutrophil  Pleuritic chest pain Resolved -CTA chest : Continued evolution of septic pulmonary emboli now globally increased surrounding consolidated opacifications which likely explains recent pleuritic chest discomfort  Headache and sinus congestion/intraparenchymal hematoma likely secondary to septic emboli -Continue Zyrtec and ibuprofen prn -11/28 MRI brain: small IP hematoma (ICH) -CTA head and neck:  subacute bleed without evidence of infectious etiology -Neurologist states ICH NOT etiology of HA (sts ICH at least 50-34 weeks old)  Focal LEFT visual field cut (left quadranaopsia) Due to septic emboli and neuro expects to slowly resolve  Cervical neck discomfort -Continue K pad along with Voltaren gel -12/01 discontinued Robaxin in favor of low-dose baclofen QID with improvement in discomfort -MRI cervical spine reveals left upper C-spine septic arthritis-ID plans to continue current IV anbxs thru 12/10 as planned then transition to PO bioavailable anbx to use after DC  Hyperreflexive LEs/myelopathy Appreciate neurosurgical assistance MRI thoracic UNREMARKABLE Stable Ck & TSH   Intraparenchymal hematoma -Seen on MRI brain 11/28-d/w attending and appears c/w hemorraghic transformation of septic emboli -Neurology consulted -Lovenox and Advil dc'd  IVDU with narcotics -Continue Suboxone.  Noted dosage increased during hospitalization to treat acute pain from septic arthritis -Unable to use narcotics secondary to requirement for Suboxone utilize NSAIDs secondary to intracranial hemorrhage -Previously received Suboxone therapy at clinic in [facility]  Depression and insomnia Continue preadmission Celexa, BuSpar and trazodone at bedtime  BuSpar dose increased to 7.5 mg TID on 11/17  Thrombocytopenia:  Resolved.   Recent right shoulder and calf abscess -Source of bacteremia -Status post I&D on 10/06  at [place] at outside hospital revealed no infectious involvement of AC joint    Severe protein calorie malnutrition Etiology: chronic illness and poor intake prior to admission secondary to IVDU Weight stable at 100.53 lbs  Mild intermittent asthma/current tobacco abuse -Currently asymptomatic -Continue prn bronchodilators and nicotine patch   Normocytic anemia with iron deficiency -Secondary to chronic malnutrition -Current hemoglobin 8.4 -Transfuse for hemoglobin </= 7.      +HCV AB -ID recommended treatment after discharge. -Also ID recommended HIV preexposure prophylaxis with either oral Truvada or long-acting aptitude injectable   Blood culture data: -Blood cultures positive for MRSA on 10/21, 10/22.   -Blood cultures 1/2 positive from 10/24. -Blood cultures 1/2 positive from 10/25. -Blood cultures 2 out of 2 positive from 10/27. -Blood cultures from 10/29 and finalized on 11/3   Data Reviewed: Basic Metabolic Panel: Recent Labs  Lab 08/06/21 0251 08/07/21 0454 08/10/21 1032  NA 138  --  136  K 4.3  --  4.8  CL 102  --  103  CO2 29  --  26  GLUCOSE 105*  --  104*  BUN 22*  --  22*  CREATININE 0.72 0.71 0.68  CALCIUM 8.9  --  9.3     Scheduled Meds:  baclofen  5 mg Oral QID   buprenorphine-naloxone  2 tablet Sublingual BID   busPIRone  7.5 mg Oral TID   cetirizine HCl  5 mg Oral Daily   Chlorhexidine Gluconate Cloth  6 each Topical Daily   citalopram  20 mg Oral Daily   diclofenac  Sodium  4 g Topical QID   famotidine  20 mg Oral Daily   feeding supplement  237 mL Oral TID BM   multivitamin with minerals  1 tablet Oral Daily   nicotine  21 mg Transdermal Daily   sodium chloride flush  10-40 mL Intracatheter Q12H   traZODone  150 mg Oral QHS   Continuous Infusions:  vancomycin 1,250 mg (08/10/21 2011)    Principal Problem:   MRSA bacteremia Active Problems:   Severe opioid use disorder (HCC)   Endocarditis   Septic pulmonary  embolism (HCC)   Chronic viral hepatitis C (HCC)   Shoulder abscess   Calf abscess   Odynophagia   Protein-calorie malnutrition, severe   Chest pain   IVDU (intravenous drug user)   Intraparenchymal hematoma of brain   Cerebral thrombosis with cerebral infarction   Cerebral embolism with cerebral infarction   Subarachnoid hemorrhage   Intracerebral hemorrhage   Cervical discitis   Consultants: Infectious disease Cardiothoracic surgery Neurosurgery Neurology  Procedures: TTE TEE  Antibiotics: Vancomycin 10/21 through 10/28 Valacyclovir 10/23 through 10/29 Daptomycin 10/28 >> 11/05 Ceftaroline 10/28 >> 11/05 Vancomycin 11/05 >>   Time spent: 15 minutes 43  days  Signature  Lala Lund M.D on 08/11/2021 at 9:56 AM   -  To page go to [email or web address]

## 2021-08-11 NOTE — Progress Notes (Signed)
Mobility Specialist Progress Note   08/11/21 1500  Mobility  Activity Ambulated in hall  Level of Assistance Independent  Assistive Device None  Distance Ambulated (ft) 3500 ft  Mobility Ambulated independently in hallway  Mobility Response Tolerated well  Mobility performed by Mobility specialist  $Mobility charge 1 Mobility   Pt ambulates well, still has slight complaint of neck pain but no further symptoms. Returned back to room w/ all needs met.   Holland Falling Mobility Specialist Phone Number [phone]

## 2021-08-12 DIAGNOSIS — B9562 Methicillin resistant Staphylococcus aureus infection as the cause of diseases classified elsewhere: Secondary | ICD-10-CM | POA: Diagnosis not present

## 2021-08-12 DIAGNOSIS — R7881 Bacteremia: Secondary | ICD-10-CM | POA: Diagnosis not present

## 2021-08-12 NOTE — Progress Notes (Signed)
[facility] HOSPITALISTS PROGRESS NOTE  Yvette Gaines ION:[id number] DOB: [birth year] DOA: 06/29/2021 PCP: [name], DO  Status: Remains inpatient appropriate because:  Unsafe to discharge patient with history of IVDU with PICC line in place for outpatient IV therapy Patient requires a total of 6 weeks of IV antibiotics with blood cultures have as of 11/3 and last dose due 12/10  Barriers to discharge: Social: IVDU history so unable to discharge with a PICC line in place to complete antibiotic therapies in the outpatient environment/home setting  Clinical: Final blood cultures from 10/29 returned negative as of 11/3 Has done well on Suboxone therefore we will continue this medication until a bed offer obtained and an actual discharge date confirmed before transitioning over to short acting scheduled Oxy IR  Level of care:  Med-Surg   Code Status: Full Family Communication: Patient DVT prophylaxis: Lovenox discontinued secondary to ICH-SCDs COVID vaccination status: Unknown  HPI: 50 y.o. 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   Patient presented to [facility] on 10/05 for worsening of left shoulder swelling and pain.  Was found to have abscess of left shoulder and left calf,  I&D was done on 10/06 and culture showed MRSA.Yvette Gaines  Patient however signed out [place] after the surgery. On 10/16, patient came back to [facility] complaining about new onset of chest pains and shortness of breath, CT chest showed multiple bilateral septic emboli, echocardiogram showed new onset of tricuspid vegetation compatible with endocarditis.  Blood culture showed again MRSA.  Patient was started on vancomycin and cefepime since. During hospital stay, patient was also found to develop acute  thrombocytopenia, DIC was ruled out.  Patient was admitted to [facility], and started on IV vancomycin, TEE with tricuspid leaflet vegetation.   Subjective: Patient in bed, appears comfortable, denies any headache, mild neck pain, no fever, no chest pain or pressure, no shortness of breath , no abdominal pain. No new focal weakness.    Objective: Vitals:   08/11/21 2059 08/12/21 0418  BP: 108/61 101/66  Pulse: 69 70  Resp: 18 18  Temp: 97.9 F (36.6 C) 98.6 F (37 C)  SpO2: 99% 99%   No intake or output data in the 24 hours ending 08/12/21 1200       Filed Weights   07/29/21 0500 07/31/21 0521 07/31/21 1500  Weight: 48.9 kg 47.5 kg 50.5 kg    Exam:  Awake Alert, No new F.N deficits, Normal affect [place].AT,PERRAL Supple Neck, No JVD,   Symmetrical Chest wall movement, Good air movement bilaterally, CTAB RRR,No Gallops, Rubs or new Murmurs,  +ve B.Sounds, Abd Soft, No tenderness,   No Cyanosis, Clubbing or edema    Assessment/Plan: Acute problems: MRSA bacteremia/MRSA TV endocarditis with moderate TVR/septic emboli to Lungs, Brain, C spine/recent right shoulder and right calf abscess /status post I&D 10/6 (at [facility])  -TEE: tricuspid valve endocarditis.  CT surgery: not a candidate for angio VAC due to small size of vegetation -Narrowed to vancomycin 11/5-LD due 12/10 -11/27 repeat echocardiogram:  small mass attached to the anterior leaflet of the tricuspid valve that measures 0.6 cm x 0.5 cm. The lesion is calcified and likely represents healed vegetation given known history of TV endocarditis. There is moderate to severe TR that is eccentric. The tricuspid valve is abnormal -12/01 IV team noted purulent  drainage at PICC insertion site without any associated redness or streaking.  As precaution PICC line removed and PIV placed.  12/2 prior PICC site unremarkable and dressing in place.  12/2 WBC 6.8 with normal neutrophils and absolute neutrophil  Pleuritic  chest pain Resolved -CTA chest : Continued evolution of septic pulmonary emboli now globally increased surrounding consolidated opacifications which likely explains recent pleuritic chest discomfort  Headache and sinus congestion/intraparenchymal hematoma likely secondary to septic emboli -Continue Zyrtec and ibuprofen prn -11/28 MRI brain: small IP hematoma (ICH) -CTA head and neck:  subacute bleed without evidence of infectious etiology -Neurologist states ICH NOT etiology of HA (sts ICH at least 84-108 weeks old)  Focal LEFT visual field cut (left quadranaopsia) Due to septic emboli and neuro expects to slowly resolve  Cervical neck discomfort -Continue K pad along with Voltaren gel -12/01 discontinued Robaxin in favor of low-dose baclofen QID with improvement in discomfort -MRI cervical spine reveals left upper C-spine septic arthritis-ID plans to continue current IV anbxs thru 12/10 as planned then transition to PO bioavailable anbx to use after DC  Hyperreflexive LEs/myelopathy Appreciate neurosurgical assistance MRI thoracic UNREMARKABLE Stable Ck & TSH   Intraparenchymal hematoma -Seen on MRI brain 11/28-d/w attending and appears c/w hemorraghic transformation of septic emboli -Neurology consulted -Lovenox and Advil dc'd  IVDU with narcotics -Continue Suboxone.  Noted dosage increased during hospitalization to treat acute pain from septic arthritis -Unable to use narcotics secondary to requirement for Suboxone utilize NSAIDs secondary to intracranial hemorrhage -Previously received Suboxone therapy at clinic in [facility]  Depression and insomnia Continue preadmission Celexa, BuSpar and trazodone at bedtime  BuSpar dose increased to 7.5 mg TID on 11/17  Thrombocytopenia:  Resolved.   Recent right shoulder and calf abscess -Source of bacteremia -Status post I&D on 10/06 at [place] at outside hospital revealed no infectious involvement of AC joint     Severe protein calorie malnutrition Etiology: chronic illness and poor intake prior to admission secondary to IVDU Weight stable at 100.53 lbs  Mild intermittent asthma/current tobacco abuse -Currently asymptomatic -Continue prn bronchodilators and nicotine patch   Normocytic anemia with iron deficiency -Secondary to chronic malnutrition -Current hemoglobin 8.4 -Transfuse for hemoglobin </= 7.      +HCV AB -ID recommended treatment after discharge. -Also ID recommended HIV preexposure prophylaxis with either oral Truvada or long-acting aptitude injectable   Blood culture data: -Blood cultures positive for MRSA on 10/21, 10/22.   -Blood cultures 1/2 positive from 10/24. -Blood cultures 1/2 positive from 10/25. -Blood cultures 2 out of 2 positive from 10/27. -Blood cultures from 10/29 and finalized on 11/3   Data Reviewed: Basic Metabolic Panel: Recent Labs  Lab 08/06/21 0251 08/07/21 0454 08/10/21 1032  NA 138  --  136  K 4.3  --  4.8  CL 102  --  103  CO2 29  --  26  GLUCOSE 105*  --  104*  BUN 22*  --  22*  CREATININE 0.72 0.71 0.68  CALCIUM 8.9  --  9.3     Scheduled Meds:  baclofen  5 mg Oral QID   buprenorphine-naloxone  2 tablet Sublingual BID   busPIRone  7.5 mg Oral TID   cetirizine HCl  5 mg Oral Daily   Chlorhexidine Gluconate Cloth  6 each Topical Daily   citalopram  20 mg Oral Daily   diclofenac Sodium  4 g Topical QID   famotidine  20 mg Oral Daily   feeding supplement  237 mL Oral TID BM   multivitamin with minerals  1 tablet Oral Daily   nicotine  21 mg Transdermal Daily   sodium chloride flush  10-40 mL Intracatheter Q12H   traZODone  150 mg Oral QHS   Continuous Infusions:  vancomycin Stopped (08/12/21 0854)    Principal Problem:   MRSA bacteremia Active Problems:   Severe opioid use disorder (HCC)   Endocarditis   Septic pulmonary embolism (HCC)   Chronic viral hepatitis C (HCC)   Shoulder abscess   Calf abscess    Odynophagia   Protein-calorie malnutrition, severe   Chest pain   IVDU (intravenous drug user)   Intraparenchymal hematoma of brain   Cerebral thrombosis with cerebral infarction   Cerebral embolism with cerebral infarction   Subarachnoid hemorrhage   Intracerebral hemorrhage   Cervical discitis   Consultants: Infectious disease Cardiothoracic surgery Neurosurgery Neurology  Procedures: TTE TEE  Antibiotics: Vancomycin 10/21 through 10/28 Valacyclovir 10/23 through 10/29 Daptomycin 10/28 >> 11/05 Ceftaroline 10/28 >> 11/05 Vancomycin 11/05 >>   Time spent: 15 minutes 44  days  Signature  Lala Lund M.D on 08/12/2021 at 12:00 PM   -  To page go to [email or web address]

## 2021-08-13 DIAGNOSIS — B9562 Methicillin resistant Staphylococcus aureus infection as the cause of diseases classified elsewhere: Secondary | ICD-10-CM | POA: Diagnosis not present

## 2021-08-13 DIAGNOSIS — R7881 Bacteremia: Secondary | ICD-10-CM | POA: Diagnosis not present

## 2021-08-13 LAB — COMPREHENSIVE METABOLIC PANEL
ALT: 23 U/L (ref 0–44)
AST: 26 U/L (ref 15–41)
Albumin: 3.5 g/dL (ref 3.5–5.0)
Alkaline Phosphatase: 82 U/L (ref 38–126)
Anion gap: 7 (ref 5–15)
BUN: 27 mg/dL — ABNORMAL HIGH (ref 6–20)
CO2: 25 mmol/L (ref 22–32)
Calcium: 9.3 mg/dL (ref 8.9–10.3)
Chloride: 104 mmol/L (ref 98–111)
Creatinine, Ser: 0.71 mg/dL (ref 0.44–1.00)
GFR, Estimated: >60 mL/min (ref >60)
Glucose, Bld: 97 mg/dL (ref 70–99)
Potassium: 4.4 mmol/L (ref 3.5–5.1)
Sodium: 136 mmol/L (ref 135–145)
Total Bilirubin: 0.3 mg/dL (ref 0.3–1.2)
Total Protein: 6.8 g/dL (ref 6.5–8.1)

## 2021-08-13 LAB — CBC
HCT: 37.6 % (ref 36.0–46.0)
Hemoglobin: 12.3 g/dL (ref 12.0–15.0)
MCH: 28.9 pg (ref 26.0–34.0)
MCHC: 32.7 g/dL (ref 30.0–36.0)
MCV: 88.3 fL (ref 80.0–100.0)
Platelets: 292 10*3/uL (ref 150–400)
RBC: 4.26 MIL/uL (ref 3.87–5.11)
RDW: 16.7 % — ABNORMAL HIGH (ref 11.5–15.5)
WBC: 6.2 10*3/uL (ref 4.0–10.5)
nRBC: 0 % (ref 0.0–0.2)

## 2021-08-13 LAB — MAGNESIUM: Magnesium: 2.1 mg/dL (ref 1.7–2.4)

## 2021-08-13 LAB — VANCOMYCIN, PEAK: Vancomycin Pk: 47 ug/mL — ABNORMAL HIGH (ref 30–40)

## 2021-08-13 MED ORDER — BUPRENORPHINE HCL-NALOXONE HCL 8-2 MG SL SUBL
1.0000 | SUBLINGUAL_TABLET | Freq: Every day | SUBLINGUAL | Status: DC
  Administered 2021-08-13: 1 via SUBLINGUAL
  Filled 2021-08-13: qty 1

## 2021-08-13 MED ORDER — BACLOFEN 10 MG PO TABS
5.0000 mg | ORAL_TABLET | Freq: Four times a day (QID) | ORAL | Status: DC
  Administered 2021-08-13 – 2021-08-14 (×4): 5 mg via ORAL
  Filled 2021-08-13 (×4): qty 1

## 2021-08-13 MED ORDER — BUPRENORPHINE HCL-NALOXONE HCL 8-2 MG SL SUBL
1.0000 | SUBLINGUAL_TABLET | Freq: Two times a day (BID) | SUBLINGUAL | Status: DC
  Administered 2021-08-13 – 2021-08-19 (×12): 1 via SUBLINGUAL
  Filled 2021-08-13 (×12): qty 1

## 2021-08-13 NOTE — TOC Progression Note (Signed)
Transition of Care [facility]) - Progression Note    Patient Details  Name: Yvette Gaines MRN: [id number] Date of Birth: 24-Dec-1970  Transition of [facility]) CM/SW [place], RN Phone Number: 08/13/2021, 11:43 AM  Clinical Narrative:    Case management met with the patient in the hospital room to discuss transitions of care to home on Sunday, December 08/19/2021.  The patient will receive her last dose of IV antibiotics on Saturday, 08/18/2021 at 2000 and Erin Hearing, NP is aware.  The patient states that her son, Yvette Gaines [phone] will be providing transportation home by car on Sunday.   The patient states that she is currently not working and has difficulty affording her medications.  Discharge medications will be called into the [facility] pharmacy her Erin Hearing, NP and have the discharge medications delivered to the nursing unit on Friday, 08/17/2021 to send home with the patient on Sunday, 08/19/2021.  The patient plans to follow up with [facility] for outpatient follow up regarding her outpatient drug treatment and Suboxone prescriptions.  The patient plans to follow up with [facility] and appointment was scheduled for Friday, September 28, 2020 at 2:30 pm with Aundra Dubin, MD.  The patient will have PICC line discontinued prior to discharge to home and will not need dme for home.  CM and MSW with DTP Team will continue to follow the patient for discharge needs for home with family - pending 08/19/2021 - son to provide transportation.   Expected Discharge Plan: Skilled Nursing Facility Barriers to Discharge: Ship broker, Continued Medical Work up, SNF Pending bed offer  Expected Discharge Plan and Services Expected Discharge Plan: [place] In-house Referral: PCP / Psychologist, educational, Conservation officer, nature Services: CM Consult   Living arrangements for the past 2 months: Apartment                                        Social Determinants of Health (SDOH) Interventions    Readmission Risk Interventions No flowsheet data found.

## 2021-08-13 NOTE — Progress Notes (Signed)
[facility] HOSPITALISTS PROGRESS NOTE  Yvette Gaines ZOX:[id number] DOB: Jan 18, 1971 DOA: 06/29/2021 PCP: [name], DO  Status: Remains inpatient appropriate because:  Unsafe to discharge patient with history of IVDU with PICC line in place for outpatient IV therapy Patient requires a total of 6 weeks of IV antibiotics with blood cultures have as of 11/3 and last dose due 12/10  Barriers to discharge: Social: IVDU history so unable to discharge with a PICC line in place to complete antibiotic therapies in the outpatient environment/home setting  Clinical: Final blood cultures from 10/29 returned negative as of 11/3   Level of care:  Med-Surg   Code Status: Full Family Communication: Patient DVT prophylaxis: Lovenox discontinued secondary to ICH-SCDs COVID vaccination status: Unknown  HPI: 50 y.o. 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   Patient presented to [facility] on 10/05 for worsening of left shoulder swelling and pain.  Was found to have abscess of left shoulder and left calf,  I&D was done on 10/06 and culture showed MRSA.[name]  Patient however signed out [place] after the surgery. On 10/16, patient came back to [facility] complaining about new onset of chest pains and shortness of breath, CT chest showed multiple bilateral septic emboli, echocardiogram showed new onset of tricuspid vegetation compatible with endocarditis.  Blood culture showed again MRSA.  Patient was started on vancomycin and cefepime since. During hospital stay, patient was also found to develop acute thrombocytopenia, DIC was ruled out.  Patient was admitted to [facility], and started on IV vancomycin, TEE with tricuspid leaflet vegetation.   Subjective: Reporting significant  improvement in neck discomfort on high-dose Suboxone and baclofen.  Baclofen scheduled but was not given round-the-clock and instead in a 4 times daily pattern.  Patient does have increase in neck pain late in the evening but is able to sleep undisturbed.  Discussed decreasing current Suboxone dosage by 50% since current dosage is too high for the outpatient setting.  Patient agreed.  She is also aware that if she is able to tolerate the lower dose consideration will be given to further decreasing the dose but administering as we currently are on a twice daily schedule.  She does report dyspnea on exertion during extended activity.  Objective: Vitals:   08/12/21 2122 08/13/21 0804  BP: 136/75 102/77  Pulse: 73 75  Resp:  16  Temp: 98.3 F (36.8 C) 98 F (36.7 C)  SpO2: 91% 97%    Intake/Output Summary (Last 24 hours) at 08/13/2021 0831 Last data filed at 08/12/2021 2120 Gross per 24 hour  Intake 540 ml  Output --  Net 540 ml        Filed Weights   07/29/21 0500 07/31/21 0521 07/31/21 1500  Weight: 48.9 kg 47.5 kg 50.5 kg    Exam:  Constitutional: NAD, alert Respiratory: Lungs are clear to auscultation on anterior exam, room air, no increased work of breathing Cardiovascular: S1-S2, pulse regular, normotensive, no peripheral edema Abdomen: no tenderness or distention, soft. Bowel sounds positive.  LBM 12/4 Musculoskeletal: Much less tender with palpation over left lateral neck. Neurologic: CN 2-12 grossly intact-neurological team documented left visual field cut. Sensation intact, Strength 5/5 x all 4 extremities.  Psychiatric: Normal judgment and insight. Alert and oriented x 3.  Pleasant affect   Assessment/Plan: Acute problems: MRSA bacteremia/MRSA  TV endocarditis with moderate TVR/septic emboli/recent right shoulder and right calf abscess /status post I&D 10/6 (at [facility])  -TEE: tricuspid valve endocarditis.  CT surgery: not a candidate for angio VAC due to small  size of vegetation -Narrowed to vancomycin 11/5-LD due 12/10 -11/27 repeat echocardiogram:  small mass attached to the anterior leaflet of the tricuspid valve that measures 0.6 cm x 0.5 cm. The lesion is calcified and likely represents healed vegetation given known history of TV endocarditis. There is moderate to severe TR that is eccentric. The tricuspid valve is abnormal -12/01 IV team noted purulent drainage at PICC insertion site without any associated redness or streaking.  As precaution PICC line removed and PIV placed.  12/2 prior PICC site unremarkable and dressing in place.  12/2 WBC 6.8 with normal neutrophils and absolute neutrophil  Pleuritic chest pain Resolved -CTA chest : Continued evolution of septic pulmonary emboli now globally increased surrounding consolidated opacifications which likely explains recent pleuritic chest discomfort  Headache and sinus congestion/intraparenchymal hematoma likely secondary to septic emboli -Continue Zyrtec and ibuprofen prn -11/28 MRI brain: small IP hematoma (ICH) -CTA head and neck:  subacute bleed without evidence of infectious etiology -Neurologist states ICH NOT etiology of HA (sts ICH at least 44-79 weeks old)  Focal LEFT visual field cut (left quadranaopsia) Due to septic emboli and neuro expects to slowly resolve Will need to follow-up with ophthalmologist after discharge  Cervical neck discomfort -Continue K pad along with Voltaren gel -12/01 discontinued Robaxin in favor of low-dose baclofen QID with improvement in discomfort -Unable to utilize NSAIDs or narcotics since she is currently on Suboxone.  After discussion with physician who will be prescribing this medication for me since patient's pain is significantly improved plan is to decrease from 2 8-2 mg tablets twice daily to 1 tablet twice daily.  Patient agreeable to this and is aware that if able to tolerate that consideration will be given to further decreasing base dosage but  continuing twice daily schedule.  Patient aware that baclofen dosage can be adjusted if necessary to help with pain management. -MRI cervical spine reveals left upper C-spine septic arthritis-ID plans to continue current IV anbxs thru 12/10 as planned then transition to PO bioavailable anbx to use after DC  Hyperreflexive LEs/myelopathy Appreciate neurosurgical assistance MRI thoracic UNREMARKABLE Ck TSH   Intraparenchymal hematoma -Seen on MRI brain 11/28-d/w attending and appears c/w hemorraghic transformation of septic emboli -Neurology consulted -Lovenox and Advil dc'd  IVDU with narcotics -Continue Suboxone.  Noted dosage increased during hospitalization to treat acute pain from septic arthritis -Unable to use narcotics secondary to requirement for Suboxone utilize NSAIDs secondary to intracranial hemorrhage-see above regarding Suboxone dosage adjustments beginning on 12/5 -Previously received Suboxone therapy at clinic in [facility]  Depression and insomnia Continue preadmission Celexa, BuSpar and trazodone at bedtime  BuSpar dose increased to 7.5 mg TID on 11/17  Thrombocytopenia:  Resolved.   Recent right shoulder and calf abscess -Source of bacteremia -Status post I&D on 10/06 at [place] at outside hospital revealed no infectious involvement of AC joint    Severe protein calorie malnutrition Etiology: chronic illness and poor intake prior to admission secondary to IVDU Weight stable at 100.53 lbs  Mild intermittent asthma/current tobacco abuse -Currently asymptomatic -Continue prn bronchodilators and nicotine patch   Normocytic anemia with iron deficiency -Secondary to chronic malnutrition -Current hemoglobin 8.4 -Transfuse for hemoglobin </= 7.      +HCV AB -ID recommended treatment after discharge. -Also ID  recommended HIV preexposure prophylaxis with either oral Truvada or long-acting aptitude injectable   Blood culture data: -Blood  cultures positive for MRSA on 10/21, 10/22.   -Blood cultures 1/2 positive from 10/24. -Blood cultures 1/2 positive from 10/25. -Blood cultures 2 out of 2 positive from 10/27. -Blood cultures from 10/29 and finalized on 11/3   Data Reviewed: Basic Metabolic Panel: Recent Labs  Lab 08/07/21 0454 08/10/21 1032 08/13/21 0510  NA  --  136 136  K  --  4.8 4.4  CL  --  103 104  CO2  --  26 25  GLUCOSE  --  104* 97  BUN  --  22* 27*  CREATININE 0.71 0.68 0.71  CALCIUM  --  9.3 9.3  MG  --   --  2.1     Scheduled Meds:  baclofen  5 mg Oral QID   buprenorphine-naloxone  2 tablet Sublingual BID   busPIRone  7.5 mg Oral TID   cetirizine HCl  5 mg Oral Daily   Chlorhexidine Gluconate Cloth  6 each Topical Daily   citalopram  20 mg Oral Daily   diclofenac Sodium  4 g Topical QID   famotidine  20 mg Oral Daily   feeding supplement  237 mL Oral TID BM   multivitamin with minerals  1 tablet Oral Daily   nicotine  21 mg Transdermal Daily   sodium chloride flush  10-40 mL Intracatheter Q12H   traZODone  150 mg Oral QHS   Continuous Infusions:  vancomycin 1,250 mg (08/12/21 1956)    Principal Problem:   MRSA bacteremia Active Problems:   Severe opioid use disorder (HCC)   Endocarditis   Septic pulmonary embolism (HCC)   Chronic viral hepatitis C (HCC)   Shoulder abscess   Calf abscess   Odynophagia   Protein-calorie malnutrition, severe   Chest pain   IVDU (intravenous drug user)   Intraparenchymal hematoma of brain   Cerebral thrombosis with cerebral infarction   Cerebral embolism with cerebral infarction   Subarachnoid hemorrhage   Intracerebral hemorrhage   Cervical discitis   Consultants: Infectious disease Cardiothoracic surgery Neurosurgery Neurology  Procedures: TTE TEE  Antibiotics: Vancomycin 10/21 through 10/28 Valacyclovir 10/23 through 10/29 Daptomycin 10/28 >> 11/05 Ceftaroline 10/28 >> 11/05 Vancomycin 11/05 >>   Time spent: 15  minutes    Erin Hearing ANP  [facility] 7 am - 330 pm/M-F for direct patient care and secure chat Please refer to Amion for contact info 45  days

## 2021-08-13 NOTE — Progress Notes (Signed)
Mobility Specialist Progress Note:   08/13/21 1130  Mobility  Activity Ambulated in hall  Level of Assistance Independent  Assistive Device None  Distance Ambulated (ft) 1650 ft  Mobility Ambulated independently in hallway  Mobility Response Tolerated well  Mobility performed by Mobility specialist  $Mobility charge 1 Mobility   Pt c/o worse neck pain today than yesterday. Otherwise asx during ambulation.   Nelta Numbers Mobility Specialist  Phone [phone]

## 2021-08-14 DIAGNOSIS — R7881 Bacteremia: Secondary | ICD-10-CM | POA: Diagnosis not present

## 2021-08-14 DIAGNOSIS — B9562 Methicillin resistant Staphylococcus aureus infection as the cause of diseases classified elsewhere: Secondary | ICD-10-CM | POA: Diagnosis not present

## 2021-08-14 LAB — VANCOMYCIN, TROUGH: Vancomycin Tr: 9 ug/mL — ABNORMAL LOW (ref 15–20)

## 2021-08-14 LAB — CREATININE, SERUM
Creatinine, Ser: 0.69 mg/dL (ref 0.44–1.00)
GFR, Estimated: >60 mL/min (ref >60)

## 2021-08-14 MED ORDER — BACLOFEN 10 MG PO TABS
10.0000 mg | ORAL_TABLET | Freq: Four times a day (QID) | ORAL | Status: DC
  Administered 2021-08-14 – 2021-08-16 (×8): 10 mg via ORAL
  Filled 2021-08-14 (×8): qty 1

## 2021-08-14 NOTE — Progress Notes (Signed)
Nutrition Follow-up  DOCUMENTATION CODES:   Severe malnutrition in context of chronic illness  INTERVENTION:   Continue Multivitamin w/ minerals daily Continue Ensure Enlive po TID, each supplement provides 350 kcal and 20 grams of protein Encourage good PO intake   NUTRITION DIAGNOSIS:   Severe Malnutrition related to chronic illness, cancer and cancer related treatments as evidenced by severe fat depletion, severe muscle depletion. 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  Pt reports that she is still doing well, finally received her first snack since being ordered after prior visits. Pt excited to be going home on Sunday. Pt with no other questions or concerns.  Per EMR, pt ate 50% of breakfast on 12/04 and 75% of breakfast on 12/06.  Continued inpatient due to needing IV antibiotics until 12/10. Plan for discharge on Sunday 12/11.   Medications reviewed and include: Pepcid, MVI, IV antibiotics Labs reviewed: BUN 27  Diet Order:   Diet Order             Diet regular Room service appropriate? Yes; Fluid consistency: Thin  Diet effective now                   EDUCATION NEEDS:   No education needs have been identified at this time  Skin:  Skin Assessment: Skin Integrity Issues: Skin Integrity Issues:: Other (Comment) Other: Skin tear - L leg  Last BM:  08/13/2021  Height:   Ht Readings from Last 1 Encounters:  07/03/21 5\' 2"  (1.575 m)    Weight:   Wt Readings from Last 1 Encounters:  07/31/21 50.5 kg    Ideal Body Weight:  50  kg  BMI:  Body mass index is 20.38 kg/m.  Estimated Nutritional Needs:   Kcal:  1700-1900  Protein:  85-100 grams  Fluid:  > 1.7 L    Yvette Gaines BS, PLDN Clinical Dietitian See AMiON for contact information.

## 2021-08-14 NOTE — Progress Notes (Signed)
Mobility Specialist Progress Note:   08/14/21 1035  Mobility  Activity Ambulated in hall  Level of Assistance Independent  Assistive Device None  Distance Ambulated (ft) 1650 ft  Mobility Ambulated independently in hallway  Mobility Response Tolerated well  Mobility performed by Mobility specialist  $Mobility charge 1 Mobility   Pt asx during ambulation. Back in room with all needs met.   Nelta Numbers Mobility Specialist  Phone [phone]

## 2021-08-14 NOTE — Progress Notes (Signed)
[facility] HOSPITALISTS PROGRESS NOTE  Yvette Gaines PFX:[id number] DOB: August 29, 1971 DOA: 06/29/2021 PCP: [name], DO  Status: Remains inpatient appropriate because:  Unsafe to discharge patient with history of IVDU with PICC line in place for outpatient IV therapy Patient requires a total of 6 weeks of IV antibiotics with blood cultures have as of 11/3 and last dose due 12/10 with planned discharge date Sunday 12/11  Barriers to discharge: Social: IVDU history so unable to discharge with a PICC line in place to complete antibiotic therapies in the outpatient environment/home setting  Clinical: Final blood cultures from 10/29 returned negative as of 11/3   Level of care:  Med-Surg   Code Status: Full Family Communication: Patient DVT prophylaxis: Lovenox discontinued secondary to ICH-SCDs COVID vaccination status: Unknown  HPI: 50 y.o. 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   Patient presented to [facility] on 10/05 for worsening of left shoulder swelling and pain.  Was found to have abscess of left shoulder and left calf,  I&D was done on 10/06 and culture showed MRSA.[name]  Patient however signed out [place] after the surgery. On 10/16, patient came back to [facility] complaining about new onset of chest pains and shortness of breath, CT chest showed multiple bilateral septic emboli, echocardiogram showed new onset of tricuspid vegetation compatible with endocarditis.  Blood culture showed again MRSA.  Patient was started on vancomycin and cefepime since. During hospital stay, patient was also found to develop acute thrombocytopenia, DIC was ruled out.  Patient was admitted to [facility], and started on IV vancomycin, TEE with tricuspid leaflet vegetation.    Subjective: Alert.  Pointing out new tender circumscribed area beneath the skin on the palmar surface of the right forearm.  This area is over a vein.  Is a reported still had end of day pain.  Clarified that prior dosage for Suboxone was 8-2 mg twice daily which is her current dosage  Objective: Vitals:   08/14/21 0601 08/14/21 0808  BP: 106/66 113/65  Pulse: 71 66  Resp: 17 18  Temp: 98.4 F (36.9 C) 98.3 F (36.8 C)  SpO2: 99% 100%   No intake or output data in the 24 hours ending 08/14/21 0843       Filed Weights   07/29/21 0500 07/31/21 0521 07/31/21 1500  Weight: 48.9 kg 47.5 kg 50.5 kg    Exam:  Constitutional: NAD, alert Respiratory: Lungs are clear to auscultation on anterior exam, room air, no increased work of breathing Cardiovascular: S1-S2, pulse regular, normotensive, no peripheral edema Abdomen: no tenderness or distention, soft. Bowel sounds positive.  LBM 12/5 Musculoskeletal: Much less tender with palpation over left lateral neck. Neurologic: CN 2-12 grossly intact-neurological team documented left visual field cut. Sensation intact, Strength 5/5 x all 4 extremities.  Psychiatric: Normal judgment and insight. Alert and oriented x 3.  Pleasant affect   Assessment/Plan: Acute problems: MRSA bacteremia/MRSA TV endocarditis with moderate TVR/septic emboli/recent right shoulder and right calf abscess /status post I&D 10/6 (at [facility])  -TEE: tricuspid valve endocarditis.  CT surgery: not a candidate for angio VAC due to small size of vegetation -Narrowed to vancomycin 11/5-LD due 12/10 -11/27 repeat echocardiogram:  small mass attached to the anterior leaflet of the tricuspid valve that measures 0.6 cm x 0.5 cm. The lesion is calcified and  likely represents healed vegetation given known history of TV endocarditis. There is moderate to severe TR that is eccentric. The tricuspid valve is abnormal  ?  Cyst right palmar forearm Patient has developed a  circumscribed area right over the vein that with palpation feels like a cyst but given location directly over vein could reflect an enlarged or inflamed venous valve Continue to follow  Pleuritic chest pain Resolved -CTA chest : Continued evolution of septic pulmonary emboli now globally increased surrounding consolidated opacifications which likely explains recent pleuritic chest discomfort  Headache and sinus congestion/intraparenchymal hematoma likely secondary to septic emboli -Treated previously with Zyrtec and ibuprofen -11/28 MRI brain: small IP hematoma (ICH) -CTA head and neck:  subacute bleed without evidence of infectious etiology -Neurologist states ICH NOT etiology of HA (sts ICH at least 8-71 weeks old)  Focal LEFT visual field cut (left quadranaopsia) Due to septic emboli and neuro expects to slowly resolve Will need to follow-up with ophthalmologist after discharge  Cervical neck discomfort -Continue K pad along with Voltaren gel - given acute pain and limited drug modalities available we will continue Suboxone 8-2 mg BID -[place] helping but with recent decrease in Suboxone dose to above we will increase baclofen dosage to 10 mg -MRI cervical spine reveals left upper C-spine septic arthritis-ID plans to continue current IV anbxs thru 12/10 as planned then transition to PO bioavailable anbx to use after DC  Hyperreflexive LEs/myelopathy Appreciate neurosurgical assistance MRI thoracic UNREMARKABLE Ck TSH   Intraparenchymal hematoma -Seen on MRI brain 11/28-d/w attending and appears c/w hemorraghic transformation of septic emboli -Neurology consulted -Lovenox and Advil dc'd  IVDU with narcotics -Continue Suboxone.  Patient clarified on 12/6 that her actual preadmission dosage was 8-2 mg BID -Unable to use narcotics secondary to requirement for Suboxone utilize NSAIDs secondary to intracranial hemorrhage-see above regarding Suboxone dosage adjustments beginning on  12/5 -After discharge she will follow-up with her previous Suboxone clinic in [facility]  Depression and insomnia Continue preadmission Celexa, BuSpar and trazodone at bedtime  BuSpar dose increased to 7.5 mg TID on 11/17  Thrombocytopenia:  Resolved.   Recent right shoulder and calf abscess -Source of bacteremia -Status post I&D on 10/06 at [place] at outside hospital revealed no infectious involvement of AC joint    Severe protein calorie malnutrition Etiology: chronic illness and poor intake prior to admission secondary to IVDU Weight stable at 100.53 lbs  Mild intermittent asthma/current tobacco abuse -Currently asymptomatic -Continue prn bronchodilators and nicotine patch   Normocytic anemia with iron deficiency -Secondary to chronic malnutrition -Current hemoglobin 8.4 -Transfuse for hemoglobin </= 7.      +HCV AB -ID recommended treatment after discharge. -Also ID recommended HIV preexposure prophylaxis with either oral Truvada or long-acting aptitude injectable   Blood culture data: -Blood cultures positive for MRSA on 10/21, 10/22.   -Blood cultures 1/2 positive from 10/24. -Blood cultures 1/2 positive from 10/25. -Blood cultures 2 out of 2 positive from 10/27. -Blood cultures from 10/29 and finalized on 11/3   Data Reviewed: Basic Metabolic Panel: Recent Labs  Lab 08/10/21 1032 08/13/21 0510 08/14/21 0614  NA 136 136  --   K 4.8 4.4  --   CL 103 104  --   CO2 26 25  --   GLUCOSE 104* 97  --   BUN 22* 27*  --   CREATININE 0.68 0.71 0.69  CALCIUM 9.3 9.3  --   MG  --  2.1  --  Scheduled Meds:  baclofen  5 mg Oral Q6H   buprenorphine-naloxone  1 tablet Sublingual BID   busPIRone  7.5 mg Oral TID   cetirizine HCl  5 mg Oral Daily   Chlorhexidine Gluconate Cloth  6 each Topical Daily   citalopram  20 mg Oral Daily   diclofenac Sodium  4 g Topical QID   famotidine  20 mg Oral Daily   feeding supplement  237 mL Oral TID BM    multivitamin with minerals  1 tablet Oral Daily   nicotine  21 mg Transdermal Daily   sodium chloride flush  10-40 mL Intracatheter Q12H   traZODone  150 mg Oral QHS   Continuous Infusions:  vancomycin 1,250 mg (08/13/21 2006)    Principal Problem:   MRSA bacteremia Active Problems:   Severe opioid use disorder ([place])   Endocarditis   Septic pulmonary embolism (HCC)   Chronic viral hepatitis C ([place])   Shoulder abscess   Calf abscess   Odynophagia   Protein-calorie malnutrition, severe   Chest pain   IVDU (intravenous drug user)   Intraparenchymal hematoma of brain   Cerebral thrombosis with cerebral infarction   Cerebral embolism with cerebral infarction   Subarachnoid hemorrhage   Intracerebral hemorrhage   Cervical discitis   Consultants: Infectious disease Cardiothoracic surgery Neurosurgery Neurology  Procedures: TTE TEE  Antibiotics: Vancomycin 10/21 through 10/28 Valacyclovir 10/23 through 10/29 Daptomycin 10/28 >> 11/05 Ceftaroline 10/28 >> 11/05 Vancomycin 11/05 >>   Time spent: 15 minutes    Erin Hearing ANP  [facility] 7 am - 330 pm/M-F for direct patient care and secure chat Please refer to Amion for contact info 46  days

## 2021-08-14 NOTE — Progress Notes (Signed)
Pharmacy Antibiotic Note  Yvette Gaines is a 50 y.o. female admitted on 06/29/2021 with MRSA TV endocarditis and C-spine septic arthritis. Patient was previously not clearing her blood cultures and was put on combination therapy with daptomycin and ceftaroline for 7 days ending 11/3. Blood cultures from 10/29 with no growth (final). ECHO on 11/6 showed small residual vegetations on the anterior TV leaflet. Pharmacy was consulted for vancomycin dosing.  Last AUC calculated at 429 (at low end of goal range) on 11/29 and dose increased slightly given new C-spine pathology.  WBC WNL; afebrile; SCr stable (0.69 on 08/14/21); CrCl 66.5 ml/min  Steady-state vancomycin levels drawn after 6th dose of vancomycin 1250 mg IV Q 24 hrs regimen (last dose given at 2006 PM on 12/5) are as follows: Vancomycin peak: 47 mg/L (drawn at 2209 PM on 12/5) Vancomycin trough: 9 mg/L (drawn at [place] on 12/6) Vancomycin AUC calculated using these levels is 572 (just above the goal range)  Per provider note, last vancomycin dose will be 12/10, with planned discharge date of 12/11  Plan: Continue vancomycin 1250 mg IV Q 24 hrs Monitor WBC, temp, renal function, weekly vancomycin levels   Height: 5\' 2"  (157.5 cm) Weight: 50.5 kg (111 lb 6.4 oz) IBW/kg (Calculated) : 50.1 kg  Temp (24hrs), Avg:98.4 F (36.9 C), Min:98.3 F (36.8 C), Max:98.6 F (37 C)  Recent Labs  Lab 08/10/21 1032 08/13/21 0510 08/13/21 2209 08/14/21 0614  WBC 6.8 6.2  --   --   CREATININE 0.68 0.71  --  0.69  VANCOPEAK  --   --  47*  --      Estimated Creatinine Clearance: 66.5 mL/min (by C-G formula based on SCr of 0.69 mg/dL).    No Known Allergies  Anti-infectives this admission: Valacyclovir 10/23 >> 10/29 Vancomycin 10/21 >> 10/28, restarted 11/4 >>(12/10) Daptomycin 10/28 >> 11/3 Ceftaroline 10/28 >> 11/3  Vancomycin levels  11/8 VP 33; 11/9 VT 7, AUC 443 > continue  11/15 VP 38: 11/15 VT 8 (listed as pk in epic), AUC  480 >continue  11/28 VP 32; VT 7 - increase dose 12/5 VP 47 mg/L, VT 9 mg/L - cont vancomycin 1250 mg IV Q 24 hrs  Microbiology results: 10/21 BCx: MRSA 3/3 10/22 BCx >> MRSA 2/2 10/24 BCx >> 1/3 MRSA  10/25 Bcx >> 1/4 MRSA 10/27 Bcx >> MRSA 4/4 10/29 BCx >> NG/final 10/21 Hepatitis A, Hepatitis B, HIV serologies: negative 10/21 Hepatitis C antibody: reactive 10/27 HCV quant: >50 IU/ml  Gillermina Hu, PharmD, BCPS, [facility] Clinical Pharmacist 08/14/2021 8:17 PM

## 2021-08-15 DIAGNOSIS — R7881 Bacteremia: Secondary | ICD-10-CM | POA: Diagnosis not present

## 2021-08-15 DIAGNOSIS — B9562 Methicillin resistant Staphylococcus aureus infection as the cause of diseases classified elsewhere: Secondary | ICD-10-CM | POA: Diagnosis not present

## 2021-08-15 MED ORDER — DOXYCYCLINE HYCLATE 100 MG PO TABS
100.0000 mg | ORAL_TABLET | Freq: Two times a day (BID) | ORAL | Status: DC
  Administered 2021-08-19 (×2): 100 mg via ORAL
  Filled 2021-08-15 (×2): qty 1

## 2021-08-15 NOTE — Progress Notes (Signed)
Mobility Specialist Criteria Algorithm Info.   08/15/21 1620  Mobility  Activity Ambulated in hall  Range of Motion/Exercises Active;All extremities  Level of Assistance Independent  Assistive Device None  Distance Ambulated (ft) 1650 ft  Mobility Ambulated with assistance in hallway  Mobility Response Tolerated well  Mobility performed by Mobility specialist  Bed Position Semi-fowlers   Patient ambulated in hallway independently with steady gait. Tolerated ambulation well without complaint or incident.   08/15/2021 4:20 PM

## 2021-08-15 NOTE — Progress Notes (Signed)
Mobility Specialist Progress Note:   08/15/21 1145  Mobility  Activity Ambulated in hall  Level of Assistance Independent  Assistive Device None  Distance Ambulated (ft) 1650 ft  Mobility Ambulated independently in hallway  Mobility Response Tolerated well  Mobility performed by Mobility specialist  $Mobility charge 1 Mobility   Pt asx during ambulation. Minor neck pain today, states it has improved.   Nelta Numbers Mobility Specialist  Phone [phone]

## 2021-08-15 NOTE — Progress Notes (Signed)
[facility] PRO[facility]  Yvette Gaines ZOX:[id number] DOB: [birth year] DOA: 06/29/2021 PCP: [name], DO  Status: Remains inpatient appropriate because:  Unsafe to discharge patient with history of IVDU with PICC line in place for outpatient IV therapy Patient requires a total of 6 weeks of IV antibiotics with blood cultures have as of 11/3 and last dose due 12/10 with planned discharge date Sunday 12/11 -she will begin 30 days of doxycycline on date of discharge  Barriers to discharge: Social: IVDU history so unable to discharge with a PICC line in place to complete antibiotic therapies in the outpatient environment/home setting  Clinical: Final blood cultures from 10/29 returned negative as of 11/3   Level of care:  Med-Surg   Code Status: Full Family Communication: Patient DVT prophylaxis: Lovenox discontinued secondary to ICH-SCDs COVID vaccination status: Unknown  HPI: 50 y.o. 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   Patient presented to [facility] on 10/05 for worsening of left shoulder swelling and pain.  Was found to have abscess of left shoulder and left calf,  I&D was done on 10/06 and culture showed MRSA.[name]  Patient however signed out [place] after the surgery. On 10/16, patient came back to [facility] complaining about new onset of chest pains and shortness of breath, CT chest showed multiple bilateral septic emboli, echocardiogram showed new onset of tricuspid vegetation compatible with endocarditis.  Blood culture showed again MRSA.  Patient was started on vancomycin and cefepime since. During hospital stay, patient was also found to develop acute thrombocytopenia, DIC was ruled out.  Patient was admitted to [facility], and  started on IV vancomycin, TEE with tricuspid leaflet vegetation.   Subjective: Alert and no specific complaints verbalized.  Continues to have increased neck pain in the evenings but suspects this will be her new baseline until septic arthritis resolved.  Pain is not severe enough to interfere with sleep or mobility.  Objective: Vitals:   08/15/21 0546 08/15/21 0752  BP: (!) 97/57 112/71  Pulse: 65 82  Resp: 18 16  Temp: 98 F (36.7 C) 98.6 F (37 C)  SpO2: (!) 82% 100%    Intake/Output Summary (Last 24 hours) at 08/15/2021 0800 Last data filed at 08/14/2021 1800 Gross per 24 hour  Intake 480 ml  Output --  Net 480 ml         Filed Weights   07/29/21 0500 07/31/21 0521 07/31/21 1500  Weight: 48.9 kg 47.5 kg 50.5 kg    Exam:  Constitutional: NAD, alert Respiratory: Lungs are clear to auscultation on anterior exam, room air, no increased work of breathing Cardiovascular: S1-S2, pulse regular, normotensive, no peripheral edema Abdomen: no tenderness or distention, soft. Bowel sounds positive.  LBM 12/5 Musculoskeletal: Much less tender with palpation over left lateral neck. Neurologic: CN 2-12 grossly intact-neurological team documented left visual field cut. Sensation intact, Strength 5/5 x all 4 extremities.  Psychiatric: Normal judgment and insight. Alert and oriented x 3.  Pleasant affect   Assessment/Plan: Acute problems: MRSA bacteremia/MRSA TV endocarditis with moderate TVR/septic emboli/recent right shoulder and right calf abscess /status post I&D 10/6 (at [facility])  -TEE: tricuspid valve endocarditis.  CT surgery: not a candidate for angio VAC due to small size of vegetation -Narrowed to vancomycin 11/5-LD due 12/10.  Per ID we will begin  doxycycline 100 mg twice daily on 12/11 for total of 30 days -11/27 repeat echocardiogram:  small mass attached to the anterior leaflet of the tricuspid valve that measures 0.6 cm x 0.5 cm. The lesion is calcified and likely  represents healed vegetation given known history of TV endocarditis. There is moderate to severe TR that is eccentric. The tricuspid valve is abnormal  ?  Cyst right palmar forearm Patient has developed a circumscribed area right over the vein that with palpation feels like a cyst but given location directly over vein could reflect an enlarged or inflamed venous valve Continue to follow  Pleuritic chest pain Resolved -CTA chest : Continued evolution of septic pulmonary emboli now globally increased surrounding consolidated opacifications which likely explains recent pleuritic chest discomfort  Headache and sinus congestion/intraparenchymal hematoma likely secondary to septic emboli -Treated previously with Zyrtec and ibuprofen -11/28 MRI brain: small IP hematoma (ICH) -CTA head and neck:  subacute bleed without evidence of infectious etiology -Neurologist states ICH NOT etiology of HA (sts ICH at least 91-1 weeks old)  Focal LEFT visual field cut (left quadranaopsia) Due to septic emboli and neuro expects to slowly resolve Will need to follow-up with ophthalmologist after discharge  Cervical neck discomfort -Continue K pad along with Voltaren gel - given acute pain and limited drug modalities available we will continue Suboxone 8-2 mg BID -[place] helping but with recent decrease in Suboxone dose to above we will increase baclofen dosage to 10 mg -MRI cervical spine reveals left upper C-spine septic arthritis-ID plans to continue current IV anbxs thru 12/10 as planned then transition to PO bioavailable anbx to use after DC  Hyperreflexive LEs/myelopathy Appreciate neurosurgical assistance MRI thoracic UNREMARKABLE Ck TSH   Intraparenchymal hematoma -Seen on MRI brain 11/28-d/w attending and appears c/w hemorraghic transformation of septic emboli -Neurology consulted -Lovenox and Advil dc'd  IVDU with narcotics -Continue Suboxone.  Patient clarified on 12/6 that her actual  preadmission dosage was 8-2 mg BID -Unable to use narcotics secondary to requirement for Suboxone utilize NSAIDs secondary to intracranial hemorrhage-see above regarding Suboxone dosage adjustments beginning on 12/5 -After discharge she will follow-up with her previous Suboxone clinic in [facility]  Depression and insomnia Continue preadmission Celexa, BuSpar and trazodone at bedtime  BuSpar dose increased to 7.5 mg TID on 11/17  Thrombocytopenia:  Resolved.   Recent right shoulder and calf abscess -Source of bacteremia -Status post I&D on 10/06 at [place] at outside hospital revealed no infectious involvement of AC joint    Severe protein calorie malnutrition Etiology: chronic illness and poor intake prior to admission secondary to IVDU Weight stable at 100.53 lbs  Mild intermittent asthma/current tobacco abuse -Currently asymptomatic -Continue prn bronchodilators and nicotine patch   Normocytic anemia with iron deficiency -Secondary to chronic malnutrition -Current hemoglobin 8.4 -Transfuse for hemoglobin </= 7.      +HCV AB -ID recommended treatment after discharge. -Also ID recommended HIV preexposure prophylaxis with either oral Truvada or long-acting aptitude injectable   Blood culture data: -Blood cultures positive for MRSA on 10/21, 10/22.   -Blood cultures 1/2 positive from 10/24. -Blood cultures 1/2 positive from 10/25. -Blood cultures 2 out of 2 positive from 10/27. -Blood cultures from 10/29 and finalized on 11/3   Data Reviewed: Basic Metabolic Panel: Recent Labs  Lab 08/10/21 1032 08/13/21 0510 08/14/21 0614  NA 136 136  --   K 4.8 4.4  --   CL 103 104  --   CO2 26 25  --  GLUCOSE 104* 97  --   BUN 22* 27*  --   CREATININE 0.68 0.71 0.69  CALCIUM 9.3 9.3  --   MG  --  2.1  --      Scheduled Meds:  baclofen  10 mg Oral Q6H   buprenorphine-naloxone  1 tablet Sublingual BID   busPIRone  7.5 mg Oral TID   cetirizine HCl  5  mg Oral Daily   Chlorhexidine Gluconate Cloth  6 each Topical Daily   citalopram  20 mg Oral Daily   diclofenac Sodium  4 g Topical QID   famotidine  20 mg Oral Daily   feeding supplement  237 mL Oral TID BM   multivitamin with minerals  1 tablet Oral Daily   nicotine  21 mg Transdermal Daily   sodium chloride flush  10-40 mL Intracatheter Q12H   traZODone  150 mg Oral QHS   Continuous Infusions:  vancomycin 1,250 mg (08/14/21 2152)    Principal Problem:   MRSA bacteremia Active Problems:   Severe opioid use disorder (HCC)   Endocarditis   Septic pulmonary embolism (HCC)   Chronic viral hepatitis C (HCC)   Shoulder abscess   Calf abscess   Odynophagia   Protein-calorie malnutrition, severe   Chest pain   IVDU (intravenous drug user)   Intraparenchymal hematoma of brain   Cerebral thrombosis with cerebral infarction   Cerebral embolism with cerebral infarction   Subarachnoid hemorrhage   Intracerebral hemorrhage   Cervical discitis   Consultants: Infectious disease Cardiothoracic surgery Neurosurgery Neurology  Procedures: TTE TEE  Antibiotics: Vancomycin 10/21 through 10/28 Valacyclovir 10/23 through 10/29 Daptomycin 10/28 >> 11/05 Ceftaroline 10/28 >> 11/05 Vancomycin 11/05 >>   Time spent: 15 minutes    Erin Hearing ANP  [facility] 7 am - 330 pm/M-F for direct patient care and secure chat Please refer to Amion for contact info 47  days

## 2021-08-16 ENCOUNTER — Other Ambulatory Visit (HOSPITAL_COMMUNITY): Payer: Self-pay

## 2021-08-16 MED ORDER — TRAZODONE HCL 150 MG PO TABS
150.0000 mg | ORAL_TABLET | Freq: Every day | ORAL | 3 refills | Status: AC
  Filled 2021-08-16: qty 30, 30d supply, fill #0

## 2021-08-16 MED ORDER — DOXYCYCLINE HYCLATE 100 MG PO TABS
100.0000 mg | ORAL_TABLET | Freq: Two times a day (BID) | ORAL | 0 refills | Status: AC
  Filled 2021-08-16: qty 60, 30d supply, fill #0

## 2021-08-16 MED ORDER — ALPRAZOLAM 0.25 MG PO TABS
0.2500 mg | ORAL_TABLET | Freq: Three times a day (TID) | ORAL | 0 refills | Status: AC | PRN
  Filled 2021-08-16: qty 30, 10d supply, fill #0

## 2021-08-16 MED ORDER — CETIRIZINE HCL 5 MG/5ML PO SOLN: 5.0000 mg | Freq: Every day | ORAL | Status: AC

## 2021-08-16 MED ORDER — BACLOFEN 10 MG PO TABS
10.0000 mg | ORAL_TABLET | Freq: Three times a day (TID) | ORAL | Status: DC
  Administered 2021-08-16 – 2021-08-19 (×9): 10 mg via ORAL
  Filled 2021-08-16 (×9): qty 1

## 2021-08-16 MED ORDER — CERTAVITE/ANTIOXIDANTS PO TABS
1.0000 | ORAL_TABLET | Freq: Every day | ORAL | 3 refills | Status: AC
  Filled 2021-08-16: qty 30, 30d supply, fill #0

## 2021-08-16 MED ORDER — BUSPIRONE HCL 7.5 MG PO TABS
7.5000 mg | ORAL_TABLET | Freq: Three times a day (TID) | ORAL | 3 refills | Status: AC
  Filled 2021-08-16: qty 90, 30d supply, fill #0

## 2021-08-16 MED ORDER — CITALOPRAM HYDROBROMIDE 20 MG PO TABS
20.0000 mg | ORAL_TABLET | Freq: Every day | ORAL | 3 refills | Status: AC
  Filled 2021-08-16: qty 30, 30d supply, fill #0

## 2021-08-16 MED ORDER — DICLOFENAC SODIUM 1 % EX GEL
4.0000 g | Freq: Four times a day (QID) | CUTANEOUS | 3 refills | Status: AC
  Filled 2021-08-16: qty 400, 30d supply, fill #0

## 2021-08-16 NOTE — Progress Notes (Signed)
[facility]  Date of Admission:  06/29/2021   Total days of inpatient antibiotics 49  Principal Problem:   MRSA bacteremia Active Problems:   Severe opioid use disorder ([place])   Endocarditis   Septic pulmonary embolism (HCC)   Chronic viral hepatitis C (HCC)   Shoulder abscess   Calf abscess   Odynophagia   Protein-calorie malnutrition, severe   Chest pain   IVDU (intravenous drug user)   Intraparenchymal hematoma of brain   Cerebral thrombosis with cerebral infarction   Cerebral embolism with cerebral infarction   Subarachnoid hemorrhage   Intracerebral hemorrhage   Cervical discitis          Assessment:  27 YF with 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   # Intraparenchymal hematoma -#C spine septic arthritis with epidural inflammation - Pt had been complaining of headaches and felt like she had "crick" in her neck for a "while".  -No prior spine/brain imaging done during admission.  -MRI cervical spine on 11/28 showed inflammation of facet joint C3-C4 and C4-5 concerning for septic arthritis with mild epidural inflammation. . Plan was to add doxycyline -11/28 MRI brain right occipital lobe intraparenchymal hematoma, CTA showed hypodense area suggesting subacute bleed  Recommendations - Today pt reprtos with muscle relaxants her neck feels better.  -  Do not feel she needs extension of the course of IV antibiotics as these findings are likely not new. Suspect they may have been present earlier in her course and she has been on appropriate IV antibiotics. -Start doxycycline from 08/19/21 and continue for a month. She has an appointment with ID on 09/12/21.    #MRSA bacteremia with native tricuspid valve endocarditis with emboli to lung, cspine septic arthritis/epidural inflammation #Hx of  IVDA -Vancomycin end date on 08/18/21(6 weeks of antibiotics) -Although no PFO seen on imaging she may had left sided endocarditis with emboli to the brian and hematoma represents possible hemorrhagic conversation.  Recommendations -Doxycycline added as above to 6 week course of  vancomycin.   #Hx of HCV seropositivity -HCV not detected on quant RNA  Microbiology:   Antibiotics: Vancomycin till 08/18/21 to complete 6 weeks of treatment  Cultures: Blood 06/29/21 MRSA 06/30/21 MRSA 07/02/21 1/2MRSA 07/03/21 MRSA 07/05/21 2/2 MRSA 07/07/21 NGTD   SUBJECTIVE: Pt is resting in bed. She reports  Review of Systems: Review of Systems  All other systems reviewed and are negative.   Scheduled Meds:  baclofen  10 mg Oral Q8H   buprenorphine-naloxone  1 tablet Sublingual BID   busPIRone  7.5 mg Oral TID   cetirizine HCl  5 mg Oral Daily   Chlorhexidine Gluconate Cloth  6 each Topical Daily   citalopram  20 mg Oral Daily   diclofenac Sodium  4 g Topical QID   [START ON 08/19/2021] doxycycline  100 mg Oral Q12H   famotidine  20 mg Oral Daily   feeding supplement  237 mL Oral TID BM   multivitamin with minerals  1 tablet Oral Daily   nicotine  21 mg Transdermal Daily   sodium chloride flush  10-40 mL Intracatheter Q12H   traZODone  150 mg Oral QHS   Continuous Infusions:  vancomycin 1,250 mg (08/15/21 2038)   PRN Meds:.acetaminophen, albuterol, ALPRAZolam, hydrocortisone cream, influenza vac split quadrivalent PF, ondansetron **OR** ondansetron (  ZOFRAN) IV, sodium chloride flush No Known Allergies  OBJECTIVE: Vitals:   08/15/21 0752 08/15/21 1624 08/15/21 2104 08/16/21 0531  BP: 112/71 102/83 121/79 98/61  Pulse: 82 69 70 69  Resp: 16 16 17 18   Temp: 98.6 F (37 C) 98.4 F (36.9 C) 98.5 F (36.9 C)   TempSrc: Oral Oral Oral   SpO2: 100% 99% 99% 98%  Weight:      Height:       Body mass index is 20.38 kg/m.  Physical Exam Constitutional:      Appearance:  Normal appearance.  HENT:     Head: Normocephalic and atraumatic.     Right Ear: Tympanic membrane normal.     Left Ear: Tympanic membrane normal.     Nose: Nose normal.     Mouth/Throat:     Mouth: Mucous membranes are moist.  Eyes:     Extraocular Movements: Extraocular movements intact.     Conjunctiva/sclera: Conjunctivae normal.     Pupils: Pupils are equal, round, and reactive to light.  Cardiovascular:     Rate and Rhythm: Normal rate and regular rhythm.     Heart sounds: No murmur heard.   No friction rub. No gallop.  Pulmonary:     Effort: Pulmonary effort is normal.     Breath sounds: Normal breath sounds.  Abdominal:     General: Abdomen is flat.     Palpations: Abdomen is soft.  Musculoskeletal:        General: Normal range of motion.  Skin:    General: Skin is warm and dry.  Neurological:     General: No focal deficit present.     Mental Status: She is alert and oriented to person, place, and time.  Psychiatric:        Mood and Affect: Mood normal.      Lab Results Lab Results  Component Value Date   WBC 6.2 08/13/2021   HGB 12.3 08/13/2021   HCT 37.6 08/13/2021   MCV 88.3 08/13/2021   PLT 292 08/13/2021    Lab Results  Component Value Date   CREATININE 0.69 08/14/2021   BUN 27 (H) 08/13/2021   NA 136 08/13/2021   K 4.4 08/13/2021   CL 104 08/13/2021   CO2 25 08/13/2021    Lab Results  Component Value Date   ALT 23 08/13/2021   AST 26 08/13/2021   ALKPHOS 82 08/13/2021   BILITOT 0.3 08/13/2021        Laurice Record, [facility] 08/16/2021, 11:28 AM

## 2021-08-16 NOTE — Progress Notes (Signed)
Mobility Specialist Criteria Algorithm Info.   08/16/21 1511  Mobility  Activity Ambulated in hall  Range of Motion/Exercises Active;All extremities  Level of Assistance Independent  Assistive Device None  Distance Ambulated (ft) 1650 ft  Mobility Ambulated with assistance in hallway  Mobility Response Tolerated well  Mobility performed by Mobility specialist  Bed Position Semi-fowlers   Patient ambulated in hallway independently with steady gait. Tolerated ambulation well without complaint or incident.   08/16/2021 3:17 PM

## 2021-08-16 NOTE — Progress Notes (Signed)
[facility]  Yvette Gaines KWI:[id number] DOB: 03-19-1971 DOA: 06/29/2021 PCP: [name], DO  Status: Remains inpatient appropriate because:  Unsafe to discharge patient with history of IVDU with PICC line in place for outpatient IV therapy Patient requires a total of 6 weeks of IV antibiotics with blood cultures have as of 11/3 and last dose due 12/10 with planned discharge date Sunday 12/11 -she will begin 30 days of doxycycline on date of discharge  Barriers to discharge: Social: IVDU history so unable to discharge with a PICC line in place to complete antibiotic therapies in the outpatient environment/home setting  Clinical: Final blood cultures from 10/29 returned negative as of 11/3   Level of care:  Med-Surg   Code Status: Full Family Communication: Patient DVT prophylaxis: Lovenox discontinued secondary to ICH-SCDs COVID vaccination status: Unknown  HPI: 50 y.o. female with medical history significant of mild intermittent asthma, recently diagnosed MRSA on tricuspid valve with bilateral pulmonary emboli on 06/24/2021 at [facility], she left [facility] 10/21, and came to [facility] seeking treatment.   Patient presented to [facility] on 10/05 for worsening of left shoulder swelling and pain.  Was found to have abscess of left shoulder and left calf,  I&D was done on 10/06 and culture showed MRSA.[name]  Patient however signed out [place] after the surgery. On 10/16, patient came back to [facility] complaining about new onset of chest pains and shortness of breath, CT chest showed multiple bilateral septic emboli, echocardiogram showed new onset of tricuspid vegetation compatible with endocarditis.  Blood culture showed again MRSA.  Patient was started on vancomycin and cefepime since. During hospital stay, patient was also found to develop acute thrombocytopenia, DIC was ruled out.  Patient was admitted to [facility], and  started on IV vancomycin, TEE with tricuspid leaflet vegetation.   Subjective: Continues to have issues primarily upon awakening with shaking of hands and inability to hold objects but this is bilateral and not unilateral in nature.  The discussion regarding upcoming discharge.  She states she is nervous about discharging home.  Has established with her outpatient Suboxone clinic to continue therapy.  Screening will be 12/13 and subsequently an appointment will be scheduled after that date.  She has made it very clear to her family that she is no longer using drugs.  She has 1 son that continues to use drugs and had a lengthy discussion with him several days ago regarding the fact that he is no longer welcome to come to her house because she fears this may affect her ability to remain sober.  Her ex-husband has become a resource for her  He has gone through her home and removed any items or other issues that could affect her ability to remain sober.  Patient states she has contacted her church and will be attending regularly.  He repeatedly states that she is no longer interested in living the addictive lifestyle.  She does not miss going through withdrawals, going to the ER to fake conditions to obtain drugs, etc.  Objective: Vitals:   08/15/21 2104 08/16/21 0531  BP: 121/79 98/61  Pulse: 70 69  Resp: 17 18  Temp: 98.5 F (36.9 C)   SpO2: 99% 98%    Intake/Output Summary (Last 24 hours) at 08/16/2021 0830 Last data filed at 08/16/2021 0500 Gross per 24 hour  Intake 1990 ml  Output --  Net 1990 ml         Danley Danker  Weights   07/29/21 0500 07/31/21 0521 07/31/21 1500  Weight: 48.9 kg 47.5 kg 50.5 kg    Exam:  Constitutional: NAD, alert Respiratory: RA, CAT, normal respiratory effort Cardiovascular: S1-S2, pulse regular, normotensive, no peripheral edema Abdomen: no tenderness or distention, soft. Bowel sounds positive.  LBM 12/7 Musculoskeletal: Much less tender with palpation over  left lateral neck. Neurologic: CN 2-12 grossly intact-neurological team documented left visual field cut. Sensation intact, Strength 5/5 x all 4 extremities.  Psychiatric: Normal judgment and insight. Alert and oriented x 3.  Pleasant affect   Assessment/Plan: Acute problems: MRSA bacteremia/MRSA TV endocarditis with moderate TVR/septic emboli/recent right shoulder and right calf abscess /status post I&D 10/6 (at [facility])  -TEE: tricuspid valve endocarditis.  CT surgery: not a candidate for angio VAC due to small size of vegetation -Narrowed to vancomycin 11/5-LD due 12/10.  Per ID we will begin doxycycline 100 mg twice daily on 12/11 for total of 30 days -11/27 repeat echocardiogram:  small mass attached to the anterior leaflet of the tricuspid valve that measures 0.6 cm x 0.5 cm. The lesion is calcified and likely represents healed vegetation given known history of TV endocarditis. There is moderate to severe TR that is eccentric. The tricuspid valve is abnormal  ?  Cyst right palmar forearm Patient has developed a circumscribed area right over the vein that with palpation feels like a cyst but given location directly over vein could reflect an enlarged or inflamed venous valve Continue to follow  Pleuritic chest pain Resolved -CTA chest : Continued evolution of septic pulmonary emboli now globally increased surrounding consolidated opacifications which likely explains recent pleuritic chest discomfort  Headache and sinus congestion/intraparenchymal hematoma likely secondary to septic emboli -Treated previously with Zyrtec and ibuprofen -11/28 MRI brain: small IP hematoma (ICH) -CTA head and neck:  subacute bleed without evidence of infectious etiology -Neurologist states ICH NOT etiology of HA (sts ICH at least 47-42 weeks old)  Focal LEFT visual field cut (left quadranaopsia) Due to septic emboli and neuro expects to slowly resolve Will need to follow-up with ophthalmologist after  discharge  Cervical neck discomfort -Continue K pad along with Voltaren gel - given acute pain and limited drug modalities available we will continue Suboxone 8-2 mg BID -Given report of hand spasms concerned this could be side effect from baclofen so have decreased frequency from 4 times a day to 3 times per day.  If continues to have issues discussed with patient and will likely change to Skelaxin. -MRI cervical spine reveals left upper C-spine septic arthritis-ID plans to continue current IV anbxs thru 12/10 as planned then transition to PO bioavailable anbx to use after DC  Hyperreflexive LEs/myelopathy Appreciate neurosurgical assistance MRI thoracic UNREMARKABLE Ck TSH   Intraparenchymal hematoma -Seen on MRI brain 11/28-d/w attending and appears c/w hemorraghic transformation of septic emboli -Neurology consulted -Lovenox and Advil dc'd  IVDU with narcotics -Continue Suboxone.  Patient clarified on 12/6 that her actual preadmission dosage was 8-2 mg BID -Unable to use narcotics secondary to requirement for Suboxone utilize NSAIDs secondary to intracranial hemorrhage-see above regarding Suboxone dosage adjustments beginning on 12/5 -After discharge she will follow-up with her previous Suboxone clinic in [facility]  Depression and insomnia Continue preadmission Celexa, BuSpar and trazodone at bedtime  BuSpar dose increased to 7.5 mg TID on 11/17  Thrombocytopenia:  Resolved.   Recent right shoulder and calf abscess -Source of bacteremia -Status post I&D on 10/06 at [place] at outside hospital revealed no infectious  involvement of AC joint    Severe protein calorie malnutrition Etiology: chronic illness and poor intake prior to admission secondary to IVDU Weight stable at 100.53 lbs  Mild intermittent asthma/current tobacco abuse -Currently asymptomatic -Continue prn bronchodilators and nicotine patch   Normocytic anemia with iron  deficiency -Secondary to chronic malnutrition -Current hemoglobin 8.4 -Transfuse for hemoglobin </= 7.      +HCV AB -ID recommended treatment after discharge. -Also ID recommended HIV preexposure prophylaxis with either oral Truvada or long-acting aptitude injectable   Blood culture data: -Blood cultures positive for MRSA on 10/21, 10/22.   -Blood cultures 1/2 positive from 10/24. -Blood cultures 1/2 positive from 10/25. -Blood cultures 2 out of 2 positive from 10/27. -Blood cultures from 10/29 and finalized on 11/3   Data Reviewed: Basic Metabolic Panel: Recent Labs  Lab 08/10/21 1032 08/13/21 0510 08/14/21 0614  NA 136 136  --   K 4.8 4.4  --   CL 103 104  --   CO2 26 25  --   GLUCOSE 104* 97  --   BUN 22* 27*  --   CREATININE 0.68 0.71 0.69  CALCIUM 9.3 9.3  --   MG  --  2.1  --      Scheduled Meds:  baclofen  10 mg Oral Q6H   buprenorphine-naloxone  1 tablet Sublingual BID   busPIRone  7.5 mg Oral TID   cetirizine HCl  5 mg Oral Daily   Chlorhexidine Gluconate Cloth  6 each Topical Daily   citalopram  20 mg Oral Daily   diclofenac Sodium  4 g Topical QID   [START ON 08/19/2021] doxycycline  100 mg Oral Q12H   famotidine  20 mg Oral Daily   feeding supplement  237 mL Oral TID BM   multivitamin with minerals  1 tablet Oral Daily   nicotine  21 mg Transdermal Daily   sodium chloride flush  10-40 mL Intracatheter Q12H   traZODone  150 mg Oral QHS   Continuous Infusions:  vancomycin 1,250 mg (08/15/21 2038)    Principal Problem:   MRSA bacteremia Active Problems:   Severe opioid use disorder (HCC)   Endocarditis   Septic pulmonary embolism (HCC)   Chronic viral hepatitis C (HCC)   Shoulder abscess   Calf abscess   Odynophagia   Protein-calorie malnutrition, severe   Chest pain   IVDU (intravenous drug user)   Intraparenchymal hematoma of brain   Cerebral thrombosis with cerebral infarction   Cerebral embolism with cerebral infarction    Subarachnoid hemorrhage   Intracerebral hemorrhage   Cervical discitis   Consultants: Infectious disease Cardiothoracic surgery Neurosurgery Neurology  Procedures: TTE TEE  Antibiotics: Vancomycin 10/21 through 10/28 Valacyclovir 10/23 through 10/29 Daptomycin 10/28 >> 11/05 Ceftaroline 10/28 >> 11/05 Vancomycin 11/05 >>   Time spent: 15 minutes    Erin Hearing ANP  [facility] 7 am - 330 pm/M-F for direct patient care and secure chat Please refer to Amion for contact info 48  days

## 2021-08-17 ENCOUNTER — Other Ambulatory Visit (HOSPITAL_COMMUNITY): Payer: Self-pay

## 2021-08-17 MED ORDER — BACLOFEN 10 MG PO TABS
10.0000 mg | ORAL_TABLET | Freq: Three times a day (TID) | ORAL | 0 refills | Status: AC
  Filled 2021-08-17: qty 30, 10d supply, fill #0

## 2021-08-17 MED ORDER — IBUPROFEN 600 MG PO TABS
600.0000 mg | ORAL_TABLET | Freq: Three times a day (TID) | ORAL | 0 refills | Status: DC
  Filled 2021-08-17: qty 21, 7d supply, fill #0

## 2021-08-17 MED ORDER — IBUPROFEN 600 MG PO TABS
600.0000 mg | ORAL_TABLET | Freq: Three times a day (TID) | ORAL | Status: DC
  Administered 2021-08-17 – 2021-08-19 (×7): 600 mg via ORAL
  Filled 2021-08-17 (×7): qty 1

## 2021-08-17 MED ORDER — BUPRENORPHINE HCL-NALOXONE HCL 8-2 MG SL SUBL
1.0000 | SUBLINGUAL_TABLET | Freq: Two times a day (BID) | SUBLINGUAL | 0 refills | Status: AC
  Filled 2021-08-17: qty 9, 5d supply, fill #0

## 2021-08-17 MED ORDER — NICOTINE 14 MG/24HR TD PT24
14.0000 mg | MEDICATED_PATCH | Freq: Every day | TRANSDERMAL | Status: DC
  Administered 2021-08-18: 14 mg via TRANSDERMAL
  Filled 2021-08-17 (×2): qty 1

## 2021-08-17 NOTE — Progress Notes (Signed)
This chaplain is present for F/U spiritual care.  The Pt. is standing up eating her lunch and agrees to a visit before the planned discharge.   The chaplain listens reflectively to the Pt. story of health and spiritual healing. The chaplain understands the Pt. is standing firm in her faith guiding her next steps as she navigates medical appointments and returning to community. The chaplain and Pt. ended the visit with a prayer of gratitude and a smile.  Chaplain Sallyanne Kuster [phone]

## 2021-08-17 NOTE — Progress Notes (Signed)
[facility] PRO[facility]  Yvette Gaines WNI:[id number] DOB: 1971/03/03 DOA: 06/29/2021 PCP: [name], DO  Status: Remains inpatient appropriate because:  Unsafe to discharge patient with history of IVDU with PICC line in place for outpatient IV therapy Patient requires a total of 6 weeks of IV antibiotics with blood cultures have as of 11/3 and last dose due 12/10 with planned discharge date Sunday 12/11 -she will begin 30 days of doxycycline on date of discharge  Barriers to discharge: Social: IVDU history so unable to discharge with a PICC line in place to complete antibiotic therapies in the outpatient environment/home setting  Clinical: Final blood cultures from 10/29 returned negative as of 11/3   Level of care:  Med-Surg   Code Status: Full Family Communication: Patient DVT prophylaxis: Lovenox discontinued secondary to ICH-SCDs COVID vaccination status: Unknown  HPI: 50 y.o. 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   Patient presented to [facility] on 10/05 for worsening of left shoulder swelling and pain.  Was found to have abscess of left shoulder and left calf,  I&D was done on 10/06 and culture showed MRSA.[name]  Patient however signed out [place] after the surgery. On 10/16, patient came back to [facility] complaining about new onset of chest pains and shortness of breath, CT chest showed multiple bilateral septic emboli, echocardiogram showed new onset of tricuspid vegetation compatible with endocarditis.  Blood culture showed again MRSA.  Patient was started on vancomycin and cefepime since. During hospital stay, patient was also found to develop acute thrombocytopenia, DIC was ruled out.  Patient was admitted to [facility], and  started on IV vancomycin, TEE with tricuspid leaflet vegetation.   Subjective: Continues to report neck pain especially after decrease in baclofen and recent decrease in Suboxone-discussed with neurologist and have been given the okay for short-term use of ibuprofen.  Patient otherwise looking forward to discharge on Sunday  Objective: Vitals:   08/16/21 1702 08/17/21 0429  BP: 117/77 105/69  Pulse: 69 74  Resp: 18 16  Temp: 98.4 F (36.9 C) 97.9 F (36.6 C)  SpO2: 100% 99%    Intake/Output Summary (Last 24 hours) at 08/17/2021 0834 Last data filed at 08/16/2021 1300 Gross per 24 hour  Intake 720 ml  Output --  Net 720 ml         Filed Weights   07/29/21 0500 07/31/21 0521 07/31/21 1500  Weight: 48.9 kg 47.5 kg 50.5 kg    Exam:  Constitutional: NAD, alert Respiratory: RA, CAT, normal respiratory effort Cardiovascular: S1-S2, pulse regular, normotensive, no peripheral edema Abdomen: no tenderness or distention, soft. Bowel sounds positive.  LBM 12/7 Musculoskeletal: Much less tender with palpation over left lateral neck although still reporting end of day and early a.m. discomfort that actually seems to worsen with heat Neurologic: CN 2-12 grossly intact-neurological team documented left visual field cut. Sensation intact, Strength 5/5 x all 4 extremities.  Psychiatric: Normal judgment and insight. Alert and oriented x 3.  Pleasant affect   Assessment/Plan: Acute problems: MRSA bacteremia/MRSA TV endocarditis with moderate TVR/septic emboli/recent right shoulder and right calf abscess /status post I&D 10/6 (at [facility])  -TEE: tricuspid valve endocarditis.  CT surgery: not a candidate for angio VAC due to small size of vegetation -Narrowed to vancomycin 11/5-LD due 12/10.  Per ID we  will begin doxycycline 100 mg twice daily on 12/11 for total of 30 days -11/27 repeat echocardiogram:  small mass attached to the anterior leaflet of the tricuspid valve that measures 0.6  cm x 0.5 cm. The lesion is calcified and likely represents healed vegetation given known history of TV endocarditis. There is moderate to severe TR that is eccentric. The tricuspid valve is abnormal  ?  Cyst right palmar forearm Patient has developed a circumscribed area right over the vein that with palpation feels like a cyst but given location directly over vein could reflect an enlarged or inflamed venous valve Continue to follow  Pleuritic chest pain Resolved -CTA chest : Continued evolution of septic pulmonary emboli now globally increased surrounding consolidated opacifications which likely explains recent pleuritic chest discomfort  Headache and sinus congestion/intraparenchymal hematoma likely secondary to septic emboli -Treated previously with Zyrtec and ibuprofen -11/28 MRI brain: small IP hematoma (ICH) -CTA head and neck:  subacute bleed without evidence of infectious etiology -Neurologist states ICH NOT etiology of HA (sts ICH at least 46-14 weeks old)  Focal LEFT visual field cut (left quadranaopsia) Due to septic emboli and neuro expects to slowly resolve Will need to follow-up with ophthalmologist after discharge  Cervical neck discomfort -Continue Voltaren gel and baclofen -After discussion with neurology/Dr. Leonie Man okay to give short-term ibuprofen for acute pain -MRI cervical spine reveals left upper C-spine septic arthritis-ID plans to continue current IV anbxs thru 12/10 as planned then transition to PO doxycycline on 12/11  Hyperreflexive LEs/myelopathy Appreciate neurosurgical assistance MRI thoracic UNREMARKABLE TSH normal  Intraparenchymal hematoma -Seen on MRI brain 11/28-d/w attending and appears c/w hemorraghic transformation of septic emboli -Neurology consulted -Lovenox and Advil dc'd but resumed on 12/9 after discussion with Dr. Leonie Man  IVDU with narcotics -Continue Suboxone.  Patient clarified on 12/6 that her actual preadmission dosage was 8-2 mg  BID -After discharge she will follow-up with her previous Suboxone clinic in [facility]  Depression and insomnia Continue preadmission Celexa, BuSpar and trazodone at bedtime  BuSpar dose increased to 7.5 mg TID on 11/17  Thrombocytopenia:  Resolved.   Recent right shoulder and calf abscess -Source of bacteremia -Status post I&D on 10/06 at [place] at outside hospital revealed no infectious involvement of AC joint    Severe protein calorie malnutrition Etiology: chronic illness and poor intake prior to admission secondary to IVDU Weight stable at 100.53 lbs  Mild intermittent asthma/current tobacco abuse -Currently asymptomatic -Continue prn bronchodilators and nicotine patch   Normocytic anemia with iron deficiency -Secondary to chronic malnutrition -Current hemoglobin 8.4 -Transfuse for hemoglobin </= 7.      +HCV AB -ID recommended treatment after discharge. -Also ID recommended HIV preexposure prophylaxis with either oral Truvada or long-acting aptitude injectable   Blood culture data: -Blood cultures positive for MRSA on 10/21, 10/22.   -Blood cultures 1/2 positive from 10/24. -Blood cultures 1/2 positive from 10/25. -Blood cultures 2 out of 2 positive from 10/27. -Blood cultures from 10/29 and finalized on 11/3   Data Reviewed: Basic Metabolic Panel: Recent Labs  Lab 08/10/21 1032 08/13/21 0510 08/14/21 0614  NA 136 136  --   K 4.8 4.4  --   CL 103 104  --   CO2 26 25  --   GLUCOSE 104* 97  --   BUN 22* 27*  --   CREATININE 0.68 0.71 0.69  CALCIUM 9.3 9.3  --   MG  --  2.1  --  Scheduled Meds:  baclofen  10 mg Oral Q8H   buprenorphine-naloxone  1 tablet Sublingual BID   busPIRone  7.5 mg Oral TID   cetirizine HCl  5 mg Oral Daily   Chlorhexidine Gluconate Cloth  6 each Topical Daily   citalopram  20 mg Oral Daily   diclofenac Sodium  4 g Topical QID   [START ON 08/19/2021] doxycycline  100 mg Oral Q12H   famotidine  20  mg Oral Daily   feeding supplement  237 mL Oral TID BM   multivitamin with minerals  1 tablet Oral Daily   nicotine  21 mg Transdermal Daily   sodium chloride flush  10-40 mL Intracatheter Q12H   traZODone  150 mg Oral QHS   Continuous Infusions:  vancomycin 1,250 mg (08/16/21 1939)    Principal Problem:   MRSA bacteremia Active Problems:   Severe opioid use disorder ([place])   Endocarditis   Septic pulmonary embolism (H[facility])   Chronic viral hepatitis C ([place])   Shoulder abscess   Calf abscess   Odynophagia   Protein-calorie malnutrition, severe   Chest pain   IVDU (intravenous drug user)   Intraparenchymal hematoma of brain   Cerebral thrombosis with cerebral infarction   Cerebral embolism with cerebral infarction   Subarachnoid hemorrhage   Intracerebral hemorrhage   Cervical discitis   Consultants: Infectious disease Cardiothoracic surgery Neurosurgery Neurology  Procedures: TTE TEE  Antibiotics: Vancomycin 10/21 through 10/28 Valacyclovir 10/23 through 10/29 Daptomycin 10/28 >> 11/05 Ceftaroline 10/28 >> 11/05 Vancomycin 11/05 >>   Time spent: 15 minutes    Erin Hearing ANP  [facility] 7 am - 330 pm/M-F for direct patient care and secure chat Please refer to Amion for contact info 49  days

## 2021-08-17 NOTE — Progress Notes (Signed)
Pharmacy Antibiotic Note  Yvette Gaines is a 50 y.o. female admitted on 06/29/2021 with MRSA TV endocarditis and C-spine septic arthritis. Patient was previously not clearing her blood cultures and was put on combination therapy with daptomycin and ceftaroline for 7 days ending 11/3. Blood cultures from 10/29 with no growth (final). ECHO on 11/6 showed small residual vegetations on the anterior TV leaflet. Pharmacy was consulted for vancomycin dosing.  Last Vancomycin AUC 572 at goal. Renal function stable.Last dose 12/10, then take doxycycline PO x1 month  Plan: Continue vancomycin 1250 mg IV Q 24 hrs to 12/10 Monitor WBC, temp, renal function   Height: 5\' 2"  (157.5 cm) Weight: 50.5 kg (111 lb 6.4 oz) IBW/kg (Calculated) : 50.1 kg  Temp (24hrs), Avg:98.2 F (36.8 C), Min:97.9 F (36.6 C), Max:98.4 F (36.9 C)  Recent Labs  Lab 08/10/21 1032 08/13/21 0510 08/13/21 2209 08/14/21 0614 08/14/21 1954  WBC 6.8 6.2  --   --   --   CREATININE 0.68 0.71  --  0.69  --   VANCOTROUGH  --   --   --   --  9*  VANCOPEAK  --   --  47*  --   --      Estimated Creatinine Clearance: 66.5 mL/min (by C-G formula based on SCr of 0.69 mg/dL).    No Known Allergies  Anti-infectives this admission: Valacyclovir 10/23 >> 10/29 Vancomycin 10/21 >> 10/28, restarted 11/4 >>(12/10) Daptomycin 10/28 >> 11/3 Ceftaroline 10/28 >> 11/3 Doxycycline 12/11>> 1/9   Vancomycin levels  11/8 VP 33; 11/9 VT 7, AUC 443 > continue  11/15 VP 38: 11/15 VT 8 (listed as pk in epic), AUC 480 >continue  11/28 VP 32; VT 7 - increase dose 12/5 VP 47 mg/L, VT 9 mg/L - cont vancomycin 1250 mg IV Q 24 hrs  Microbiology results: 10/21 BCx: MRSA 3/3 10/22 BCx >> MRSA 2/2 10/24 BCx >> 1/3 MRSA  10/25 Bcx >> 1/4 MRSA 10/27 Bcx >> MRSA 4/4 10/29 BCx >> NG/final 10/21 Hepatitis A, Hepatitis B, HIV serologies: negative 10/21 Hepatitis C antibody: reactive 10/27 HCV quant: >50 IU/ml  Benetta Spar, PharmD, BCPS,  [facility] Clinical Pharmacist  Please check AMION for all [place] phone numbers After 10:00 PM, call [place] [phone]

## 2021-08-17 NOTE — Progress Notes (Signed)
Mobility Specialist Progress Note:   08/17/21 1230  Mobility  Activity Ambulated in hall  Level of Assistance Independent  Assistive Device None  Distance Ambulated (ft) 1650 ft  Mobility Ambulated independently in hallway  Mobility Response Tolerated well  Mobility performed by Mobility specialist  $Mobility charge 1 Mobility   Pt asx during ambulation. Back in room with all needs met.   Nelta Numbers Mobility Specialist  Phone [phone]

## 2021-08-17 NOTE — Plan of Care (Signed)

## 2021-08-17 NOTE — Discharge Summary (Signed)
Physician Discharge Summary  [name] OEU:[id number] DOB: 1971-09-03 DOA: 06/29/2021  PCP: [name], DO  Admit date: 06/29/2021 Discharge date: 08/19/2021  Time spent: 45 minutes  Recommendations for Outpatient Follow-up:  Patient will follow up with [facility] in regards to ongoing drug treatment and continued management of Suboxone therapy.  Patient has secured an appointment for screening on 12/13 Patient needs to call [facility] to be seen 1 to 3 weeks after discharge Patient also has follow-up appointment scheduled with infectious disease/Dr. Tommy Medal on January 4-time of appointment not documented currently in epic-she will need to call to clarify appointment time She will also need to follow-up with [facility] with Dr. Karle Plumber to establish primary care.  This appointment is on January 28 at 2:30 PM. Patient also is aware she needs to follow-up with the Department of Social Services regarding her Medicaid application She will start a 30-day course of doxycycline on 12/11   Discharge Diagnoses:  Principal Problem:   MRSA bacteremia Active Problems:   Severe opioid use disorder ([facility])   Endocarditis   Septic pulmonary embolism (HCC)   Chronic viral hepatitis C (Yvette Gaines)   Shoulder abscess   Calf abscess   Odynophagia   Protein-calorie malnutrition, severe   Chest pain   IVDU (intravenous drug user)   Intraparenchymal hematoma of brain   Cerebral thrombosis with cerebral infarction   Cerebral embolism with cerebral infarction   Subarachnoid hemorrhage   Intracerebral hemorrhage   Cervical discitis  SEPSIS RULED OUT  Discharge Condition: Stable  Diet recommendation: Regular  Filed Weights   07/29/21 0500 07/31/21 0521 07/31/21 1500  Weight: 48.9 kg 47.5 kg 50.5 kg    History of present illness:  50 y.o. female with medical history significant of mild intermittent  asthma, recently diagnosed MRSA on tricuspid valve with bilateral pulmonary emboli on 06/24/2021 at [facility], she left AMA 10/21, and came to [facility] seeking treatment.   Patient presented to [facility] on 10/05 for worsening of left shoulder swelling and pain.  Was found to have abscess of left shoulder and left calf,  I&D was done on 10/06 and culture showed MRSA.Yvette Gaines  Patient however signed out [facility] after the surgery. On 10/16, patient came back to [facility] complaining about new onset of chest pains and shortness of breath, CT chest showed multiple bilateral septic emboli, echocardiogram showed new onset of tricuspid vegetation compatible with endocarditis.  Blood culture showed again MRSA.  Patient was started on vancomycin and cefepime since. During hospital stay, patient was also found to develop acute thrombocytopenia, DIC was ruled out.  Patient was admitted to [facility], and started on IV vancomycin, TEE with tricuspid leaflet vegetation.  Hospital Course:  MRSA bacteremia/MRSA TV endocarditis with moderate TVR/septic emboli/recent right shoulder and right calf abscess /status post I&D 10/6 (at [facility])  -TEE: tricuspid valve endocarditis.  CT surgery: not a candidate for angio VAC due to small size of vegetation -Narrowed to vancomycin 11/5-LD due 12/10.  Per ID we will begin doxycycline 100 mg twice daily on 12/11 for total of 30 days -11/27 repeat echocardiogram:  small mass attached to the anterior leaflet of the tricuspid valve that measures 0.6 cm x 0.5 cm. The lesion is calcified and likely represents healed vegetation given known history of TV endocarditis. There is moderate to severe TR that is eccentric. The tricuspid valve is abnormal   ?  Cyst right  palmar forearm Patient has developed a circumscribed area right over the vein that with palpation feels like a cyst but given location directly over vein could reflect an enlarged or inflamed  venous valve Continue to follow   Pleuritic chest pain Resolved -CTA chest : Continued evolution of septic pulmonary emboli now globally increased surrounding consolidated opacifications which likely explains recent pleuritic chest discomfort   Headache and sinus congestion/intraparenchymal hematoma likely secondary to septic emboli -Treated previously with Zyrtec and ibuprofen -11/28 MRI brain: small IP hematoma (ICH) -CTA head and neck:  subacute bleed without evidence of infectious etiology -Neurologist states ICH NOT etiology of HA (sts ICH at least 34-62 weeks old)   Focal LEFT visual field cut (left quadranaopsia) Due to septic emboli and neuro expects to slowly resolve Will need to follow-up with ophthalmologist after discharge   Cervical neck discomfort -Continue Voltaren gel and short-term ibuprofen - cleared by Dr Leonie Man. - given acute pain and limited drug modalities available we will continue Suboxone 8-2 mg BID -Continue baclofen -MRI cervical spine reveals left upper C-spine septic arthritis-ID plans to continue current IV anbxs thru 12/10 as planned then transition to PO doxycycline for 30 days   Hyperreflexive LEs/myelopathy Appreciate neurosurgical assistance MRI thoracic UNREMARKABLE TSH normal   Intraparenchymal hematoma -Seen on MRI brain 11/28-d/w attending and appears c/w hemorraghic transformation of septic emboli -Neurology consulted -Lovenox and Advil dc'd-after discussion with Dr. Sethi/neurologist on 12/9 okay to give short-term scheduled Ativan for no more than 10 days but avoid long-term use of this medication for now   IVDU with narcotics -Continue Suboxone.  Patient clarified on 12/6 that her actual preadmission dosage was 8-2 mg BID -After discharge she will follow-up with her previous Suboxone clinic in [facility]   Depression and insomnia Continue preadmission Celexa, BuSpar and trazodone at bedtime  BuSpar dose increased to 7.5 mg TID on  11/17   Thrombocytopenia:  Resolved.   Recent right shoulder and calf abscess -Source of bacteremia -Status post I&D on 10/06 at [facility] at outside hospital revealed no infectious involvement of AC joint    Severe protein calorie malnutrition Etiology: chronic illness and poor intake prior to admission secondary to IVDU Weight stable at 100.53 lbs   Mild intermittent asthma/current tobacco abuse -Currently asymptomatic -Continue prn bronchodilators and nicotine patch   Normocytic anemia with iron deficiency -Secondary to chronic malnutrition -Current hemoglobin 8.4 -Transfuse for hemoglobin </= 7.      +HCV AB -ID recommended treatment after discharge. -Also ID recommended HIV preexposure prophylaxis with either oral Truvada or long-acting aptitude injectable  Procedures: TTE TEE  Consultations: Infectious disease Cardiothoracic surgery Neurosurgery Neurology   Antibiotics: Vancomycin 10/21 through 10/28 Valacyclovir 10/23 through 10/29 Daptomycin 10/28 >> 11/05 Ceftaroline 10/28 >> 11/05 Vancomycin 11/05 >> 12/10 Doxycycline 12/11 x 30 days    Subjective - Patient in bed, appears comfortable, denies any headache, no fever, no chest pain or pressure, no shortness of breath , no abdominal pain. No new focal weakness.   Discharge Exam: Vitals:   08/18/21 2137 08/19/21 0459  BP: 119/77 108/73  Pulse: 81 74  Resp: 17 16  Temp: 98.6 F (37 C) 98.3 F (36.8 C)  SpO2: 100% 100%     Awake Alert, No new F.N deficits, Normal affect [place].AT,PERRAL Supple Neck, No JVD,   Symmetrical Chest wall movement, Good air movement bilaterally, CTAB RRR,No Gallops, Rubs or new Murmurs,  +ve B.Sounds, Abd Soft, No tenderness,   No Cyanosis, Clubbing or edema  Discharge Instructions   Discharge Instructions     Diet - low sodium heart healthy   Complete by: As directed    Discharge instructions   Complete by: As directed    Please note on your Advil  1 change has been made instead of scheduled every 8 hours it has been changed to 8 hours as needed for moderate pain.  If you are pain-free do not take the Advil.    Follow with Primary MD [name], DO in 7 days   Get CBC, CMP, 2 view Chest X ray -  checked next visit within 1 week by Primary MD    Activity: As tolerated with Full fall precautions use walker/cane & assistance as needed  Disposition Home   Diet: Heart Healthy    Special Instructions: If you have smoked or chewed Tobacco  in the last 2 yrs please stop smoking, stop any regular Alcohol  and or any Recreational drug use.  On your next visit with your primary care physician please Get Medicines reviewed and adjusted.  Please request your Prim.MD to go over all Hospital Tests and Procedure/Radiological results at the follow up, please get all Hospital records sent to your Prim MD by signing hospital release before you go home.  If you experience worsening of your admission symptoms, develop shortness of breath, life threatening emergency, suicidal or homicidal thoughts you must seek medical attention immediately by calling 911 or calling your MD immediately  if symptoms less severe.  You Must read complete instructions/literature along with all the possible adverse reactions/side effects for all the Medicines you take and that have been prescribed to you. Take any new Medicines after you have completely understood and accpet all the possible adverse reactions/side effects.   Increase activity slowly   Complete by: As directed       Allergies as of 08/19/2021   No Known Allergies      Medication List     TAKE these medications    albuterol 108 (90 Base) MCG/ACT inhaler Commonly known as: VENTOLIN HFA Inhale 2 puffs into the lungs every 6 (six) hours as needed for wheezing or shortness of breath.   ALPRAZolam 0.25 MG tablet Commonly known as: XANAX Take 1 tablet (0.25 mg total) by mouth 3 (three) times daily as  needed for anxiety.   baclofen 10 MG tablet Commonly known as: LIORESAL Take 1 tablet (10 mg total) by mouth every 8 (eight) hours.   buprenorphine-naloxone 8-2 mg Subl SL tablet Commonly known as: SUBOXONE Place 1 tablet under the tongue 2 (two) times daily.   busPIRone 7.5 MG tablet Commonly known as: BUSPAR Take 1 tablet (7.5 mg total) by mouth 3 (three) times daily.   CertaVite/Antioxidants Tabs Take 1 tablet by mouth daily.   cetirizine HCl 5 MG/5ML Soln Commonly known as: Zyrtec Take 5 mLs (5 mg total) by mouth daily.   citalopram 20 MG tablet Commonly known as: CELEXA Take 1 tablet (20 mg total) by mouth daily.   diclofenac Sodium 1 % Gel Commonly known as: VOLTAREN Apply 4 g topically 4 (four) times daily.   doxycycline 100 MG tablet Commonly known as: VIBRA-TABS Take 1 tablet (100 mg total) by mouth every 12 (twelve) hours.   ibuprofen 600 MG tablet Commonly known as: ADVIL Take 1 tablet (600 mg total) by mouth every 8 (eight) hours as needed for moderate pain.   traZODone 150 MG tablet Commonly known as: DESYREL Take 1 tablet (150 mg total) by  mouth at bedtime.       No Known Allergies  Follow-up Information     Department of Health and Human Resources Follow up.   Why: Please follow up with Department of Social Services office regarding processing of Medicaid application. Contact information: [phone]        [facility]. Schedule an appointment as soon as possible for a visit in 2 week(s).   Specialty: Neurology Why: stroke clinic Contact information: [address] [place] [phone]        Tommy Medal, Lavell Islam, MD. Schedule an appointment as soon as possible for a visit in 1 week(s).   Specialty: Infectious Diseases Why: Please make an appointment and follow up with the clinic for Spine abscess Contact information: [address] [place]  [address] [phone]         [facility]. Schedule an appointment as soon as possible for a visit.   Why: Please make an appointment for outpatient follow up at the drug treatment program. Contact information: [address] [place], [place]        Ladell Pier, MD. Go on 09/28/2021.   Specialty: Internal Medicine Why: Please follow up with Dr. Aundra Dubin, MD on Friday, September 28, 2020 at 2:30 pm. Contact information: [place] [address] [phone]                  The results of significant diagnostics from this hospitalization (including imaging, microbiology, ancillary and laboratory) are listed below for reference.    Significant Diagnostic Studies: CT ANGIO HEAD NECK W WO CM  Result Date: 08/07/2021 CLINICAL DATA:  50 year old female with IV drug use, MRSA bacteremia, endocarditis. Right occipital lobe hemorrhage on MRI. Query mycotic aneurysm. EXAM: CT ANGIOGRAPHY HEAD AND NECK TECHNIQUE: Multidetector CT imaging of the head and neck was performed using the standard protocol during bolus administration of intravenous contrast. Multiplanar CT image reconstructions and MIPs were obtained to evaluate the vascular anatomy. Carotid stenosis measurements (when applicable) are obtained utilizing NASCET criteria, using the distal internal carotid diameter as the denominator. CONTRAST:  150mL OMNIPAQUE IOHEXOL 350 MG/ML SOLN COMPARISON:  Brain and cervical spine MRI 08/06/2021. FINDINGS: CT HEAD Brain: In the abnormal right occipital pole there is a roughly 2 cm rounded area of hypodensity corresponding to the hemosiderin and mixed signal on MRI (series 5, image 17) with an adjacent linear area of chronic appearing encephalomalacia. No hyperdense hemorrhage is identified. No significant regional mass effect. Gray-white matter differentiation elsewhere is within normal limits. No  ventriculomegaly. No other encephalomalacia identified. Calvarium and skull base: Negative. Paranasal sinuses: Visualized paranasal sinuses and mastoids are stable and well aerated. Orbits: Visualized orbits and scalp soft tissues are within normal limits. CTA NECK Skeleton: Carious and absent dentition. Left side C4-C5 facet hypertrophy but also subchondral irregularity and sclerosis. See recent cervical spine MRI. Upper chest: Centrilobular emphysema with scattered nodular, mostly peripheral and patchy areas of lung opacity. No cavitating nodules. No pleural fluid. Reactive appearing mediastinal lymph nodes. Visible central pulmonary arteries patent. There is a right side PICC line in place. Other neck: Negative soft tissue spaces of the neck. Bilateral cervical lymph nodes are somewhat numerous but not enlarged. Aortic arch: 3 vessel arch configuration with no arch atherosclerosis. Right carotid system: Minor atherosclerosis at the right ECA origin. Negative cervical right ICA. Left carotid system: Similar mild plaque at the  anterior carotid bifurcation, ECA origin. Negative cervical left ICA. Vertebral arteries: Negative proximal right subclavian artery and right vertebral artery origin. Negative right vertebral artery to the skull base. Negative proximal left subclavian artery with a somewhat early origin of the left vertebral artery, normal variant. Left vertebral also has a slightly late entry into the cervical transverse foramen, but is otherwise patent and normal to the skull base. CTA HEAD Posterior circulation: Negative distal vertebral arteries, vertebrobasilar junction. Patent PICA origins. Patent basilar artery without stenosis. Dominant appearing left AICA origin is patent. Patent SCA and left PCA origins. Fetal type right PCA origin. Left posterior communicating artery diminutive or absent. There is mild irregularity of the distal left PCA branches as seen on series 16, image 20. Right PCA P3  branches are attenuated distally. No right PCA or occipital aneurysm identified. No CTA spot sign or abnormal vascularity associated with the occipital pole. Anterior circulation: Both ICA siphons are tortuous but patent without plaque or stenosis. Normal right posterior communicating artery origin. Patent carotid termini. Normal MCA and ACA origins. Normal anterior communicating artery and bilateral ACA branches. Left MCA M1 segment and trifurcation are patent without stenosis. Right MCA M1 segment and trifurcation are patent without stenosis. Bilateral MCA branches are within normal limits. Venous sinuses: Patent, within normal limits. Anatomic variants: Fetal right PCA origin. Early origin of the left vertebral artery. Review of the MIP images confirms the above findings IMPRESSION: 1. Negative for intracranial aneurysm, and the right occipital blood products are hypodense by CT suggesting a subacute bleed. No associated CTA spot sign or abnormal vascularity. There is mild irregularity of the distal bilateral PCA branches. 2. No new intracranial abnormality. And otherwise negative CTA head and neck, with minimal cervical carotid atherosclerosis. 3. Multifocal abnormal pulmonary opacity could reflect a combination of disseminated infection and scarring. Underlying Emphysema (ICD10-J43.9). Electronically Signed   By: Genevie Ann M.D.   On: 08/07/2021 09:17   MR BRAIN W WO CONTRAST  Result Date: 08/06/2021 CLINICAL DATA:  Headache, history of IV drug use EXAM: MRI HEAD WITHOUT AND WITH CONTRAST TECHNIQUE: Multiplanar, multiecho pulse sequences of the brain and surrounding structures were obtained without and with intravenous contrast. CONTRAST:  100mL GADAVIST GADOBUTROL 1 MMOL/ML IV SOLN COMPARISON:  None. FINDINGS: Brain: There is a 2.4 cm AP x 2.1 cm TV x 1.8 cm cc T1 and T2 hyperintense collection in the right occipital lobe. There is peripheral T2* hypointensity and patchy diffusion restriction. There is no  appreciable enhancement either centrally or peripherally. There is mild surrounding FLAIR signal abnormality without significant regional midline shift. There is no other parenchymal signal abnormality. Parenchymal volume is normal. The ventricles are normal in size. There is no abnormal enhancement.  There is no midline shift. Vascular: Normal flow voids. Skull and upper cervical spine: Normal marrow signal. Sinuses/Orbits: The paranasal sinuses are clear. The globes and orbits are unremarkable. Other: None. IMPRESSION: 1. 2.4 cm T1 hyperintense collection in the right occipital lobe is most consistent with an intraparenchymal hematoma, subacute in chronicity by signal characteristics. There is mild perilesional edema without significant midline shift. 2. No other parenchymal signal abnormality; no abnormal enhancement. These results were called by telephone at the time of interpretation on 08/06/2021 at 2:38 pm to provider Erin Hearing , who verbally acknowledged these results. Electronically Signed   By: Valetta Mole M.D.   On: 08/06/2021 14:40   MR CERVICAL SPINE W WO CONTRAST  Result Date: 08/06/2021 CLINICAL DATA:  Neck pain,  acute, infection suspected. History of IV drug use. EXAM: MRI CERVICAL SPINE WITHOUT AND WITH CONTRAST TECHNIQUE: Multiplanar and multiecho pulse sequences of the cervical spine, to include the craniocervical junction and cervicothoracic junction, were obtained without and with intravenous contrast. CONTRAST:  26mL GADAVIST GADOBUTROL 1 MMOL/ML IV SOLN COMPARISON:  None. FINDINGS: Alignment: Straightening of the normal cervical lordosis. No listhesis. Vertebrae: Marrow and periarticular soft tissue edema and enhancement involving the left-sided joint at C3-4 greater than C4-5. Mild dorsal epidural enhancement/inflammation from C3-4 to C6-7 without abscess. No evidence of discitis or fracture. Cord: Normal cord signal and morphology. Posterior Fossa, vertebral arteries, paraspinal  tissues: The brain is more fully evaluated on the separate dedicated brain MRI. Preserved vertebral artery flow voids. No paraspinal fluid collection. Disc levels: C2-3: Negative. C3-4: Inflammation about the left facet joint extending into the left neural foramen. No spinal stenosis. C4-5: Inflammation about the left facet joint extending into the left neural foramen. No spinal stenosis. C5-6: Disc bulging, uncovertebral spurring, and mild facet arthrosis result in mild spinal stenosis without significant neural foraminal stenosis. C6-7: Negative. C7-T1: Negative. IMPRESSION: 1. Inflammation about the left-sided facet joints at C3-4 and C4-5 concerning for septic arthritis. Mild dorsal epidural inflammation. No abscess. 2. Mild spinal stenosis at C5-6 due to spondylosis. These results will be called to the ordering clinician or representative by the Radiologist Assistant, and communication documented in the PACS or [employer]. Electronically Signed   By: Logan Bores M.D.   On: 08/06/2021 15:11   MR THORACIC SPINE W WO CONTRAST  Result Date: 08/07/2021 CLINICAL DATA:  Myelopathy, acute or progressive thoracic myelopathy, septic cervical arthritis, eval for epidural abscess. EXAM: MRI THORACIC WITHOUT AND WITH CONTRAST TECHNIQUE: Multiplanar and multiecho pulse sequences of the thoracic spine were obtained without and with intravenous contrast. CONTRAST:  23mL GADAVIST GADOBUTROL 1 MMOL/ML IV SOLN COMPARISON:  None. FINDINGS: Alignment:  Physiologic. Vertebrae: No fracture, evidence of discitis, or bone lesion. Cord: Normal signal and morphology. No epidural collection. No abnormal contrast enhancement. Paraspinal and other soft tissues: Multiple nodular opacities throughout the lungs compatible with previously reported septic emboli. Disc levels: No spinal canal stenosis. IMPRESSION: No epidural abscess or discitis-osteomyelitis. Electronically Signed   By: Ulyses Jarred M.D.   On: 08/07/2021 19:04    ECHOCARDIOGRAM COMPLETE  Result Date: 08/07/2021    ECHOCARDIOGRAM REPORT   Patient Name:   Yvette Gaines Date of Exam: 08/07/2021 Medical Rec #:  [id number]    Height:       62.0 in Accession #:    [id number]   Weight:       111.4 lb Date of Birth:  Feb 08, 1971    BSA:          1.491 m Patient Age:    50 years     BP:           117/74 mmHg Patient Gender: F            HR:           70 bpm. Exam Location:  Inpatient Procedure: 2D Echo, Cardiac Doppler and Color Doppler Indications:    Endocarditis I38  History:        Patient has prior history of Echocardiogram examinations, most                 recent 07/14/2021.  Sonographer:    Bernadene Person RDCS Referring Phys: [place]  1. There is a small mass attached to the  anterior leaflet of the tricuspid valve that measures 0.6 cm x 0.5 cm. The lesion is calcified and likely represents healed vegetation given known history of TV endocarditis. There is moderate to severe TR that is eccentric. The tricuspid valve is abnormal. Tricuspid valve regurgitation is moderate to severe.  2. Left ventricular ejection fraction, by estimation, is 60 to 65%. The left ventricle has normal function. The left ventricle has no regional wall motion abnormalities. Left ventricular diastolic parameters were normal. There is the interventricular septum is flattened in diastole ('D' shaped left ventricle), consistent with right ventricular volume overload.  3. Right ventricular systolic function is normal. The right ventricular size is normal. There is normal pulmonary artery systolic pressure. The estimated right ventricular systolic pressure is 40.9 mmHg.  4. Right atrial size was mildly dilated.  5. The mitral valve is grossly normal. Trivial mitral valve regurgitation. No evidence of mitral stenosis.  6. The aortic valve is tricuspid. Aortic valve regurgitation is not visualized. No aortic stenosis is present.  7. The inferior vena cava is dilated in size with  >50% respiratory variability, suggesting right atrial pressure of 8 mmHg. Comparison(s): No significant change from prior study. FINDINGS  Left Ventricle: Left ventricular ejection fraction, by estimation, is 60 to 65%. The left ventricle has normal function. The left ventricle has no regional wall motion abnormalities. The left ventricular internal cavity size was normal in size. There is  no left ventricular hypertrophy. The interventricular septum is flattened in diastole ('D' shaped left ventricle), consistent with right ventricular volume overload. Left ventricular diastolic parameters were normal. Right Ventricle: The right ventricular size is normal. No increase in right ventricular wall thickness. Right ventricular systolic function is normal. There is normal pulmonary artery systolic pressure. The tricuspid regurgitant velocity is 2.62 m/s, and  with an assumed right atrial pressure of 8 mmHg, the estimated right ventricular systolic pressure is 81.1 mmHg. Left Atrium: Left atrial size was normal in size. Right Atrium: Right atrial size was mildly dilated. Pericardium: Trivial pericardial effusion is present. Mitral Valve: The mitral valve is grossly normal. Trivial mitral valve regurgitation. No evidence of mitral valve stenosis. There is no evidence of mitral valve vegetation. Tricuspid Valve: There is a small mass attached to the anterior leaflet of the tricuspid valve that measures 0.6 cm x 0.5 cm. The lesion is calcified and likely represents healed vegetation given known history of TV endocarditis. There is moderate to severe TR that is eccentric. The tricuspid valve is abnormal. Tricuspid valve regurgitation is moderate to severe. No evidence of tricuspid stenosis. Aortic Valve: The aortic valve is tricuspid. Aortic valve regurgitation is not visualized. No aortic stenosis is present. There is no evidence of aortic valve vegetation. Pulmonic Valve: The pulmonic valve was grossly normal. Pulmonic  valve regurgitation is not visualized. No evidence of pulmonic stenosis. There is no evidence of pulmonic valve vegetation. Aorta: The aortic root is normal in size and structure. Venous: The inferior vena cava is dilated in size with greater than 50% respiratory variability, suggesting right atrial pressure of 8 mmHg. IAS/Shunts: The atrial septum is grossly normal.  LEFT VENTRICLE PLAX 2D LVIDd:         4.30 cm     Diastology LVIDs:         3.10 cm     LV e' medial:    13.20 cm/s LV PW:         0.80 cm     LV E/e' medial:  6.7 LV IVS:  0.80 cm     LV e' lateral:   14.00 cm/s LVOT diam:     1.90 cm     LV E/e' lateral: 6.3 LV SV:         60 LV SV Index:   40 LVOT Area:     2.84 cm  LV Volumes (MOD) LV vol d, MOD A2C: 94.3 ml LV vol d, MOD A4C: 76.2 ml LV vol s, MOD A2C: 33.9 ml LV vol s, MOD A4C: 37.0 ml LV SV MOD A2C:     60.4 ml LV SV MOD A4C:     76.2 ml LV SV MOD BP:      48.9 ml RIGHT VENTRICLE RV S prime:     16.50 cm/s TAPSE (M-mode): 1.8 cm LEFT ATRIUM             Index        RIGHT ATRIUM           Index LA diam:        2.70 cm 1.81 cm/m   RA Area:     19.10 cm LA Vol (A2C):   38.8 ml 26.03 ml/m  RA Volume:   49.90 ml  33.47 ml/m LA Vol (A4C):   36.5 ml 24.48 ml/m LA Biplane Vol: 38.7 ml 25.96 ml/m  AORTIC VALVE LVOT Vmax:   123.00 cm/s LVOT Vmean:  82.900 cm/s LVOT VTI:    0.212 m  AORTA Ao Root diam: 3.10 cm MITRAL VALVE               TRICUSPID VALVE MV Area (PHT): 4.39 cm    TR Peak grad:   27.5 mmHg MV Decel Time: 173 msec    TR Vmax:        262.00 cm/s MV E velocity: 88.30 cm/s MV A velocity: 84.90 cm/s  SHUNTS MV E/A ratio:  1.04        Systemic VTI:  0.21 m                            Systemic Diam: 1.90 cm Eleonore Chiquito MD Electronically signed by Eleonore Chiquito MD Signature Date/Time: 08/07/2021/3:53:31 PM    Final     Microbiology: Blood culture 10/21 positive for MRSA Follow-up blood culture 10/27 also positive for MRSA Follow-up blood cultures 10/29 negative  Labs: Basic  Metabolic Panel: Recent Labs  Lab 08/13/21 0510 08/14/21 0614  NA 136  --   K 4.4  --   CL 104  --   CO2 25  --   GLUCOSE 97  --   BUN 27*  --   CREATININE 0.71 0.69  CALCIUM 9.3  --   MG 2.1  --    Liver Function Tests: Recent Labs  Lab 08/13/21 0510  AST 26  ALT 23  ALKPHOS 82  BILITOT 0.3  PROT 6.8  ALBUMIN 3.5    CBC: Recent Labs  Lab 08/13/21 0510  WBC 6.2  HGB 12.3  HCT 37.6  MCV 88.3  PLT 292    BNP (last 3 results) Recent Labs    06/29/21 1442  BNP 113.8*   Signature  Lala Lund M.D on 08/19/2021 at 9:24 AM   -  To page go to [email or web address]

## 2021-08-18 MED ORDER — MENTHOL 3 MG MT LOZG: 1.0000 | LOZENGE | OROMUCOSAL | Status: DC | PRN

## 2021-08-18 NOTE — Progress Notes (Signed)
[facility]  Yvette Gaines QIO:[id number] DOB: [birth year] DOA: 06/29/2021 PCP: [name], DO  Status: Remains inpatient appropriate because:  Unsafe to discharge patient with history of IVDU with PICC line in place for outpatient IV therapy Patient requires a total of 6 weeks of IV antibiotics with blood cultures have as of 11/3 and last dose due 12/10 with planned discharge date Sunday 12/11 -she will begin 30 days of doxycycline on date of discharge  Barriers to discharge: Social: IVDU history so unable to discharge with a PICC line in place to complete antibiotic therapies in the outpatient environment/home setting  Clinical: Final blood cultures from 10/29 returned negative as of 11/3   Level of care:  Med-Surg   Code Status: Full Family Communication: Patient DVT prophylaxis: Lovenox discontinued secondary to ICH-SCDs COVID vaccination status: Unknown  HPI: 50 y.o. 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 seeking treatment.   Patient presented to [facility] on 10/05 for worsening of left shoulder swelling and pain.  Was found to have abscess of left shoulder and left calf,  I&D was done on 10/06 and culture showed MRSA.[name]  Patient however signed out [place] after the surgery. On 10/16, patient came back to [facility] complaining about new onset of chest pains and shortness of breath, CT chest showed multiple bilateral septic emboli, echocardiogram showed new onset of tricuspid vegetation compatible with endocarditis.  Blood culture showed again MRSA.  Patient was started on vancomycin and cefepime since. During hospital stay, patient was also found to develop acute thrombocytopenia, DIC was ruled out.  Patient was admitted to [facility], and  started on IV vancomycin, TEE with tricuspid leaflet vegetation.   Subjective: Patient in bed, appears comfortable, denies any headache, no fever, no chest pain or pressure, no shortness of breath , no abdominal pain. No new focal weakness, much improved neck pain.   Objective: Vitals:   08/18/21 0522 08/18/21 0916  BP: 95/69 103/68  Pulse: 74   Resp: 16 18  Temp: 98.2 F (36.8 C) 98 F (36.7 C)  SpO2: 98%     Intake/Output Summary (Last 24 hours) at 08/18/2021 1041 Last data filed at 08/17/2021 1500 Gross per 24 hour  Intake 240 ml  Output --  Net 240 ml         Filed Weights   07/29/21 0500 07/31/21 0521 07/31/21 1500  Weight: 48.9 kg 47.5 kg 50.5 kg    Exam:  Awake Alert, No new F.N deficits, Normal affect [place].AT,PERRAL Supple Neck, No JVD,   Symmetrical Chest wall movement, Good air movement bilaterally, CTAB RRR,No Gallops, Rubs or new Murmurs,  +ve B.Sounds, Abd Soft, No tenderness,   No Cyanosis, Clubbing or edema     Assessment/Plan: Acute problems: MRSA bacteremia/MRSA TV endocarditis with moderate TVR/septic emboli/recent right shoulder and right calf abscess /status post I&D 10/6 (at [facility])  -TEE: tricuspid valve endocarditis.  CT surgery: not a candidate for angio VAC due to small size of vegetation -Narrowed to vancomycin 11/5-LD due 12/10.  Per ID we will begin doxycycline 100 mg twice daily on 12/11 for total of 30 days -11/27 repeat echocardiogram:  small mass attached to the anterior leaflet of the tricuspid valve that measures 0.6 cm x 0.5 cm. The lesion is calcified and likely represents healed vegetation given known history  of TV endocarditis. There is moderate to severe TR that is eccentric. The tricuspid valve is abnormal  ?  Cyst right palmar forearm Patient has developed a circumscribed area right over the vein that with palpation feels like a cyst but given location directly over vein could reflect an enlarged or inflamed venous  valve Continue to follow  Pleuritic chest pain Resolved -CTA chest : Continued evolution of septic pulmonary emboli now globally increased surrounding consolidated opacifications which likely explains recent pleuritic chest discomfort  Headache and sinus congestion/intraparenchymal hematoma likely secondary to septic emboli -Treated previously with Zyrtec and ibuprofen -11/28 MRI brain: small IP hematoma (ICH) -CTA head and neck:  subacute bleed without evidence of infectious etiology -Neurologist states ICH NOT etiology of HA (sts ICH at least 49-59 weeks old)  Focal LEFT visual field cut (left quadranaopsia) Due to septic emboli and neuro expects to slowly resolve Will need to follow-up with ophthalmologist after discharge  Cervical neck discomfort -Continue Voltaren gel and baclofen -After discussion with neurology/Dr. Leonie Man okay to give short-term ibuprofen for acute pain -MRI cervical spine reveals left upper C-spine septic arthritis-ID plans to continue current IV anbxs thru 12/10 as planned then transition to PO doxycycline on 12/11  Hyperreflexive LEs/myelopathy Appreciate neurosurgical assistance MRI thoracic UNREMARKABLE TSH normal  Intraparenchymal hematoma -Seen on MRI brain 11/28-d/w attending and appears c/w hemorraghic transformation of septic emboli -Neurology consulted -Lovenox and Advil dc'd but resumed on 12/9 after discussion with Dr. Leonie Man  IVDU with narcotics -Continue Suboxone.  Patient clarified on 12/6 that her actual preadmission dosage was 8-2 mg BID -After discharge she will follow-up with her previous Suboxone clinic in [facility]  Depression and insomnia Continue preadmission Celexa, BuSpar and trazodone at bedtime  BuSpar dose increased to 7.5 mg TID on 11/17  Thrombocytopenia:  Resolved.   Recent right shoulder and calf abscess -Source of bacteremia -Status post I&D on 10/06 at [place] at outside hospital revealed no  infectious involvement of AC joint    Severe protein calorie malnutrition Etiology: chronic illness and poor intake prior to admission secondary to IVDU Weight stable at 100.53 lbs  Mild intermittent asthma/current tobacco abuse -Currently asymptomatic -Continue prn bronchodilators and nicotine patch   Normocytic anemia with iron deficiency -Secondary to chronic malnutrition -Current hemoglobin 8.4 -Transfuse for hemoglobin </= 7.      +HCV AB -ID recommended treatment after discharge. -Also ID recommended HIV preexposure prophylaxis with either oral Truvada or long-acting aptitude injectable   Blood culture data: -Blood cultures positive for MRSA on 10/21, 10/22.   -Blood cultures 1/2 positive from 10/24. -Blood cultures 1/2 positive from 10/25. -Blood cultures 2 out of 2 positive from 10/27. -Blood cultures from 10/29 and finalized on 11/3   Data Reviewed: Basic Metabolic Panel: Recent Labs  Lab 08/13/21 0510 08/14/21 0614  NA 136  --   K 4.4  --   CL 104  --   CO2 25  --   GLUCOSE 97  --   BUN 27*  --   CREATININE 0.71 0.69  CALCIUM 9.3  --   MG 2.1  --      Scheduled Meds:  baclofen  10 mg Oral Q8H   buprenorphine-naloxone  1 tablet Sublingual BID   busPIRone  7.5 mg Oral TID   cetirizine HCl  5 mg Oral Daily   citalopram  20 mg Oral Daily   diclofenac Sodium  4 g Topical QID   [START ON 08/19/2021] doxycycline  100 mg  Oral Q12H   famotidine  20 mg Oral Daily   feeding supplement  237 mL Oral TID BM   ibuprofen  600 mg Oral TID   multivitamin with minerals  1 tablet Oral Daily   nicotine  14 mg Transdermal Daily   sodium chloride flush  10-40 mL Intracatheter Q12H   traZODone  150 mg Oral QHS   Continuous Infusions:  vancomycin 1,250 mg (08/17/21 2352)    Principal Problem:   MRSA bacteremia Active Problems:   Severe opioid use disorder (HCC)   Endocarditis   Septic pulmonary embolism (HCC)   Chronic viral hepatitis C (HCC)   Shoulder  abscess   Calf abscess   Odynophagia   Protein-calorie malnutrition, severe   Chest pain   IVDU (intravenous drug user)   Intraparenchymal hematoma of brain   Cerebral thrombosis with cerebral infarction   Cerebral embolism with cerebral infarction   Subarachnoid hemorrhage   Intracerebral hemorrhage   Cervical discitis   Consultants: Infectious disease Cardiothoracic surgery Neurosurgery Neurology  Procedures: TTE TEE  Antibiotics: Vancomycin 10/21 through 10/28 Valacyclovir 10/23 through 10/29 Daptomycin 10/28 >> 11/05 Ceftaroline 10/28 >> 11/05 Vancomycin 11/05 >>08/18/21 Doxy 08/19/21 >> 1 mth   Time spent: 15 minutes  50  days  Signature  Lala Lund M.D on 08/18/2021 at 10:41 AM   -  To page go to [email or web address]

## 2021-08-18 NOTE — Progress Notes (Signed)
Mobility Specialist Progress Note:   08/18/21 1520  Mobility  Activity Ambulated in hall  Level of Assistance Independent  Assistive Device None  Distance Ambulated (ft) 1650 ft  Mobility Ambulated independently in hallway  Mobility Response Tolerated well  Mobility performed by Mobility specialist  $Mobility charge 1 Mobility   Pt asx during ambulation. No neck pain during session. Pt back in bed, eager for d/c tomorrow.   Nelta Numbers Mobility Specialist  Phone [phone]

## 2021-08-18 NOTE — Progress Notes (Signed)
Mobility Specialist Progress Note:   08/18/21 0910  Mobility  Activity Ambulated in hall  Level of Assistance Independent  Assistive Device None  Distance Ambulated (ft) 1650 ft  Mobility Ambulated independently in hallway  Mobility Response Tolerated well  Mobility performed by Mobility specialist  $Mobility charge 1 Mobility   Pt asx during ambulation. Back in room with all needs met.   Nelta Numbers Mobility Specialist  Phone [phone]

## 2021-08-18 NOTE — Discharge Instructions (Addendum)
Please note on your Advil 1 change has been made instead of scheduled every 8 hours it has been changed to 8 hours as needed for moderate pain.  If you are pain-free do not take the Advil.    Follow with Primary MD Yvette Spikes, DO in 7 days   Get CBC, CMP, 2 view Chest X ray -  checked next visit within 1 week by Primary MD    Activity: As tolerated with Full fall precautions use walker/cane & assistance as needed  Disposition Home   Diet: Heart Healthy    Special Instructions: If you have smoked or chewed Tobacco  in the last 2 yrs please stop smoking, stop any regular Alcohol  and or any Recreational drug use.  On your next visit with your primary care physician please Get Medicines reviewed and adjusted.  Please request your Prim.MD to go over all Hospital Tests and Procedure/Radiological results at the follow up, please get all Hospital records sent to your Prim MD by signing hospital release before you go home.  If you experience worsening of your admission symptoms, develop shortness of breath, life threatening emergency, suicidal or homicidal thoughts you must seek medical attention immediately by calling 911 or calling your MD immediately  if symptoms less severe.  You Must read complete instructions/literature along with all the possible adverse reactions/side effects for all the Medicines you take and that have been prescribed to you. Take any new Medicines after you have completely understood and accpet all the possible adverse reactions/side effects.

## 2021-08-19 MED ORDER — IBUPROFEN 600 MG PO TABS
600.0000 mg | ORAL_TABLET | Freq: Three times a day (TID) | ORAL | 0 refills | Status: AC | PRN
  Filled 2021-08-19: qty 21, 7d supply, fill #0

## 2021-08-19 NOTE — Progress Notes (Signed)
Mobility Specialist Progress Note:   08/19/21 0910  Mobility  Activity Ambulated in hall  Level of Assistance Independent  Assistive Device None  Distance Ambulated (ft) 1100 ft  Mobility Ambulated independently in hallway  Mobility Response Tolerated well  Mobility performed by Mobility specialist  $Mobility charge 1 Mobility   Pt asx during ambulation. Eager for d/c home today!  Nelta Numbers Mobility Specialist  Phone [phone]

## 2021-08-20 ENCOUNTER — Other Ambulatory Visit (HOSPITAL_COMMUNITY): Payer: Self-pay

## 2021-08-20 ENCOUNTER — Telehealth: Payer: Self-pay

## 2021-08-20 NOTE — Telephone Encounter (Signed)
Transition Care Management Unsuccessful Follow-up Telephone Call  Date of discharge and from where:  08/19/21. [place]  Diagnosis: Acute Infective Endocarditis.   Attempts:  1st Attempt  Reason for unsuccessful TCM follow-up call:  No answer/busy. No answer and voicemail full.

## 2021-09-12 ENCOUNTER — Ambulatory Visit: Payer: 59 | Admitting: Family

## 2021-09-27 ENCOUNTER — Encounter: Payer: Self-pay | Admitting: Adult Health

## 2021-09-27 ENCOUNTER — Inpatient Hospital Stay: Payer: Self-pay | Admitting: Adult Health

## 2021-09-27 NOTE — Progress Notes (Deleted)
[facility] [address] [place]. [place] [address] [phone]       [place] NOTE  Ms. Yvette Gaines Date of Birth:  10-15-1970 Medical Record Number:  [id number]   Reason for Referral:  hospital stroke follow up    SUBJECTIVE:   CHIEF COMPLAINT:  No chief complaint on file.   HPI:   Yvette Gaines is a 51 year old female with history of IV drug use, smoker, recent MRSA bacteremia, endocarditis who presented to ED on 06/29/2021 for headache and neck pain.  Evaluated by Dr. Erlinda Hong on 08/07/2021 for persistent headache and neck pain.  Per Dr. Erlinda Hong, "per chart, patient had left shoulder abscess status post I&D on 06/13/2021 and culture showed MRSA.  She left AMA but readmitted on 06/24/2021 for shortness of breath and chest pain and found to have bilateral pulmonary septic emboli, and TEE showed tricuspid valve endocarditis, has been treated with vancomycin and cefepime.  PICC line placed for long-term antibiotics."  MRI showed right occipital small subacute ICH (likely at least 36-58 weeks old).  CTA head/neck unremarkable.  EF > 75% 11/5 and TEE 10/25 showed EF 55 to 60% with highly mobile echodense mass on the anterior leaflet of the TV.  LDL 53.  A1c 4.8.  MRI C-spine showed septic arthritis C3-C5.  MRI thoracic unremarkable. On exam, left lower quadrantanopia - no other neurologic abnormalities.  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  Headache and neck pain more likely due to septic arthritis and not from [place].  Advised continued follow-up with ID and neurosurgery.         PERTINENT IMAGING/LABS  CTA head & neck   MRI BRAIN:   2.4 cm T1 hyperintense collection in the right occipital lobe is most consistent with an intraparenchymal hematoma, subacute in chronicity by signal characteristics. There is mild perilesional edema without significant midline shift. 2. No other parenchymal signal  abnormality; no abnormal enhancement. 2D Echo 07/14/2021 EF >75%, anterior TV vegetation on  trifcuspd valve.    LDL 53 HgbA1c pending.    ROS:   14 system review of systems performed and negative with exception of ***  PMH:  Past Medical History:  Diagnosis Date   Arthritis    Asthma    Cancer ([place])    Cervical, ? 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   Chicken pox    Depression    Frequent headaches    Manic depression ([place])    Migraines    Urine incontinence     PSH:  Past Surgical History:  Procedure Laterality Date   ABDOMINAL HYSTERECTOMY  2014   partial   [place] MASS Right 2002   inner right thigh   partial bladder removal     TEE WITHOUT CARDIOVERSION N/A 07/03/2021   Procedure: TRANSESOPHAGEAL ECHOCARDIOGRAM (TEE);  Surgeon: Berniece Salines, DO;  Location: [facility];  Service: Cardiovascular;  Laterality: N/A;    Social History:  Social History   Socioeconomic History   Marital status: Single    Spouse name: Not on file   Number of children: Not on file   Years of education: Not on file   Highest education level: Not on file  Occupational History   Not on file  Tobacco Use   Smoking status: Every Day    Packs/day: 1.00    Types: Cigarettes   Smokeless tobacco: Never  Vaping Use   Vaping Use: Never  used  Substance and Sexual Activity   Alcohol use: No    Alcohol/week: 0.0 - 1.0 standard drinks   Drug use: Yes    Comment: Heroin   Sexual activity: Yes    Birth control/protection: None  Other Topics Concern   Not on file  Social History Narrative   Not on file   Social Determinants of Health   Financial Resource Strain: Not on file  Food Insecurity: Not on file  Transportation Needs: Not on file  Physical Activity: Not on file  Stress: Not on file  Social Connections: Not on file  Intimate Partner Violence: Not on file    Family History:  Family History  Problem Relation Age of Onset   Cancer  Brother        brain, lung, spine   Cancer Maternal Aunt        bone and lung   Cancer Paternal Grandfather        prostate   Arthritis Paternal Grandfather    Hypertension Paternal Grandfather    Arthritis Mother    Mental illness Mother    Arthritis Father    Heart disease Father    Hypertension Father    Arthritis Maternal Grandmother    Diabetes Maternal Grandmother    Arthritis Maternal Grandfather    Arthritis Paternal Grandmother     Medications:   Current Outpatient Medications on File Prior to Visit  Medication Sig Dispense Refill   albuterol (PROVENTIL HFA;VENTOLIN HFA) 108 (90 BASE) MCG/ACT inhaler Inhale 2 puffs into the lungs every 6 (six) hours as needed for wheezing or shortness of breath. 1 Inhaler 1   ALPRAZolam (XANAX) 0.25 MG tablet Take 1 tablet (0.25 mg total) by mouth 3 (three) times daily as needed for anxiety. 30 tablet 0   baclofen (LIORESAL) 10 MG tablet Take 1 tablet (10 mg total) by mouth every 8 (eight) hours. 30 each 0   buprenorphine-naloxone (SUBOXONE) 8-2 mg SUBL SL tablet Place 1 tablet under the tongue 2 (two) times daily. 9 tablet 0   busPIRone (BUSPAR) 7.5 MG tablet Take 1 tablet (7.5 mg total) by mouth 3 (three) times daily. 90 tablet 3   cetirizine HCl (ZYRTEC) 5 MG/5ML SOLN Take 5 mLs (5 mg total) by mouth daily. 300 mL    citalopram (CELEXA) 20 MG tablet Take 1 tablet (20 mg total) by mouth daily. 30 tablet 3   diclofenac Sodium (VOLTAREN) 1 % GEL Apply 4 g topically 4 (four) times daily. 400 g 3   doxycycline (VIBRA-TABS) 100 MG tablet Take 1 tablet (100 mg total) by mouth every 12 (twelve) hours. 60 tablet 0   ibuprofen (ADVIL) 600 MG tablet Take 1 tablet (600 mg total) by mouth every 8 (eight) hours as needed for moderate pain. 21 tablet 0   Multiple Vitamins-Minerals (CERTAVITE/ANTIOXIDANTS) TABS Take 1 tablet by mouth daily. 30 tablet 3   traZODone (DESYREL) 150 MG tablet Take 1 tablet (150 mg total) by mouth at bedtime. 30 tablet 3    No current facility-administered medications on file prior to visit.    Allergies:  No Known Allergies    OBJECTIVE:  Physical Exam  There were no vitals filed for this visit. There is no height or weight on file to calculate BMI. No results found.  No flowsheet data found.   General: well developed, well nourished, seated, in no evident distress Head: head normocephalic and atraumatic.   Neck: supple with no carotid or supraclavicular bruits Cardiovascular: regular rate and  rhythm, no murmurs Musculoskeletal: no deformity Skin:  no rash/petichiae Vascular:  Normal pulses all extremities   Neurologic Exam Mental Status: Awake and fully alert. Oriented to place and time. Recent and remote memory intact. Attention span, concentration and fund of knowledge appropriate. Mood and affect appropriate.  Cranial Nerves: Fundoscopic exam reveals sharp disc margins. Pupils equal, briskly reactive to light. Extraocular movements full without nystagmus. Visual fields full to confrontation. Hearing intact. Facial sensation intact. Face, tongue, palate moves normally and symmetrically.  Motor: Normal bulk and tone. Normal strength in all tested extremity muscles Sensory.: intact to touch , pinprick , position and vibratory sensation.  Coordination: Rapid alternating movements normal in all extremities. Finger-to-nose and heel-to-shin performed accurately bilaterally. Gait and Station: Arises from chair without difficulty. Stance is normal. Gait demonstrates normal stride length and balance with ***. Tandem walk and heel toe ***.  Reflexes: 1+ and symmetric. Toes downgoing.     NIHSS  *** Modified Rankin  ***      ASSESSMENT: Yvette Gaines is a 51 y.o. 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 Vascular risk factors include hx of IV drug use, and tobacco use.      PLAN:  R occipital ICH : Residual deficit: ***. Continue {anticoagulants:31417}  and ***  for secondary stroke prevention.  Discussed secondary stroke prevention measures and importance of close PCP follow up for aggressive stroke risk factor management. I have gone over the pathophysiology of stroke, warning signs and symptoms, risk factors and their management in some detail with instructions to go to the closest emergency room for symptoms of concern. HTN: BP goal <130/90.  Stable on *** per PCP HLD: LDL goal <70. Recent LDL ***.  DMII: A1c goal<7.0. Recent A1c ***.     Follow up in *** or call earlier if needed   CC:  [facility] provider: Dr. Leonie Man PCP: Pcp, No    I spent *** minutes of face-to-face and non-face-to-face time with patient.  This included previsit chart review including review of recent hospitalization, lab review, study review, order entry, electronic health record documentation, patient education regarding recent stroke including etiology, secondary stroke prevention measures and importance of managing stroke risk factors, residual deficits and typical recovery time and answered all other questions to patient satisfaction   Frann Rider, AGNP-BC  [facility] [address] [place], [place] [address]  Phone [phone] Fax [phone] Note: This document was prepared with digital dictation and possible smart phrase technology. Any transcriptional errors that result from this process are unintentional.

## 2021-09-28 ENCOUNTER — Inpatient Hospital Stay: Payer: Self-pay | Admitting: Internal Medicine

## 2021-11-05 ENCOUNTER — Other Ambulatory Visit (HOSPITAL_COMMUNITY): Payer: Self-pay
